# Patient Record
Sex: Male | Born: 1955 | Race: White | Hispanic: No | Smoking: Never smoker
Health system: Southern US, Community
[De-identification: ages and names within clinical notes are randomized; demographics above are authoritative.]

## PROBLEM LIST (undated history)

## (undated) DIAGNOSIS — Z86711 Personal history of pulmonary embolism: Secondary | ICD-10-CM

## (undated) DIAGNOSIS — N200 Calculus of kidney: Secondary | ICD-10-CM

## (undated) DIAGNOSIS — I10 Essential (primary) hypertension: Secondary | ICD-10-CM

## (undated) DIAGNOSIS — N400 Enlarged prostate without lower urinary tract symptoms: Secondary | ICD-10-CM

## (undated) DIAGNOSIS — Z87442 Personal history of urinary calculi: Secondary | ICD-10-CM

## (undated) DIAGNOSIS — E119 Type 2 diabetes mellitus without complications: Secondary | ICD-10-CM

## (undated) DIAGNOSIS — R0600 Dyspnea, unspecified: Secondary | ICD-10-CM

## (undated) DIAGNOSIS — I82409 Acute embolism and thrombosis of unspecified deep veins of unspecified lower extremity: Secondary | ICD-10-CM

## (undated) DIAGNOSIS — M1990 Unspecified osteoarthritis, unspecified site: Secondary | ICD-10-CM

## (undated) DIAGNOSIS — G4730 Sleep apnea, unspecified: Secondary | ICD-10-CM

## (undated) DIAGNOSIS — E785 Hyperlipidemia, unspecified: Secondary | ICD-10-CM

## (undated) DIAGNOSIS — N189 Chronic kidney disease, unspecified: Secondary | ICD-10-CM

## (undated) HISTORY — DX: Unspecified osteoarthritis, unspecified site: M19.90

## (undated) HISTORY — DX: Type 2 diabetes mellitus without complications: E11.9

---
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
## 2007-11-05 DIAGNOSIS — I2699 Other pulmonary embolism without acute cor pulmonale: Secondary | ICD-10-CM

## 2007-11-05 HISTORY — DX: Other pulmonary embolism without acute cor pulmonale: I26.99

## 2019-12-17 ENCOUNTER — Encounter: Payer: Self-pay | Admitting: Family Medicine

## 2019-12-17 ENCOUNTER — Ambulatory Visit (INDEPENDENT_AMBULATORY_CARE_PROVIDER_SITE_OTHER): Payer: Medicare Other | Admitting: Family Medicine

## 2019-12-17 ENCOUNTER — Other Ambulatory Visit: Payer: Self-pay

## 2019-12-17 ENCOUNTER — Telehealth: Payer: Self-pay | Admitting: Family Medicine

## 2019-12-17 VITALS — BP 148/78 | HR 74 | Temp 96.5°F | Ht 73.0 in | Wt 243.8 lb

## 2019-12-17 DIAGNOSIS — Z794 Long term (current) use of insulin: Secondary | ICD-10-CM

## 2019-12-17 DIAGNOSIS — E1165 Type 2 diabetes mellitus with hyperglycemia: Secondary | ICD-10-CM | POA: Diagnosis not present

## 2019-12-17 DIAGNOSIS — I10 Essential (primary) hypertension: Secondary | ICD-10-CM | POA: Insufficient documentation

## 2019-12-17 DIAGNOSIS — M1712 Unilateral primary osteoarthritis, left knee: Secondary | ICD-10-CM | POA: Insufficient documentation

## 2019-12-17 DIAGNOSIS — Z86711 Personal history of pulmonary embolism: Secondary | ICD-10-CM | POA: Diagnosis not present

## 2019-12-17 DIAGNOSIS — Z0000 Encounter for general adult medical examination without abnormal findings: Secondary | ICD-10-CM

## 2019-12-17 DIAGNOSIS — M1990 Unspecified osteoarthritis, unspecified site: Secondary | ICD-10-CM | POA: Diagnosis not present

## 2019-12-17 DIAGNOSIS — E7800 Pure hypercholesterolemia, unspecified: Secondary | ICD-10-CM

## 2019-12-17 NOTE — Telephone Encounter (Signed)
Patient was calling and requesting a refill for insulin be called in to [facility] on [address] on [place]. CB [phone]

## 2019-12-17 NOTE — Progress Notes (Addendum)
Established Patient Office Visit  Subjective:  Patient ID: [name], male    DOB: 01/20/1956  Age: 64 y.o. MRN: [id number]  CC:  Chief Complaint  Patient presents with  . Establish Care    New pt, would like left index finger checked out pt cut his finger a few weeks ago.     HPI [name] presents for establishment of care.  He recently moved into the area from [place].  Significant past medical history of pulmonary embolism, diabetes, hypertension, elevated cholesterol, severe arthritis of his left knee and generalized arthritis.  Pulmonary embolism diagnosed back in 2009.  As far as he knows that this is his only recurrence of a blood clot.  Denies any history of DVTs or family history of DVTs.  He has been on Coumadin and cilostazol since 2009 for this issue.  Taking spironolactone and potassium for his blood pressure.  Taking lovastatin for his cholesterol.  For his diarrhea Be[place]ies he is taking glipizide, Metformin, Humulin 70/30 50 units twice daily.  He is also taking indomethacin 50 mg 3 times daily on a standing basis for his arthritic pains.  He takes tramadol on an as-needed basis.  Past Medical History:  Diagnosis Date  . Arthritis   . Diabetes mellitus without complication ([place])     History reviewed. No pertinent surgical history.  Family History  Problem Relation Age of Onset  . Cancer Mother   . Heart disease Father     Social History   Socioeconomic History  . Marital status: Legally Separated    Spouse name: Not on file  . Number of children: Not on file  . Years of education: Not on file  . Highest education level: Not on file  Occupational History  . Not on file  Tobacco Use  . Smoking status: Never Smoker  . Smokeless tobacco: Never Used  Substance and Sexual Activity  . Alcohol use: Yes    Alcohol/week: 1.0 standard drinks    Types: 1 Cans of beer per week    Comment: social  . Drug use: Never  . Sexual activity: Not on file  Other  Topics Concern  . Not on file  Social History Narrative  . Not on file   Social Determinants of Health   Financial Resource Strain:   . Difficulty of Paying Living Expenses: Not on file  Food Insecurity:   . Worried About Charity fundraiser in the Last Year: Not on file  . Ran Out of Food in the Last Year: Not on file  Transportation Needs:   . Lack of Transportation (Medical): Not on file  . Lack of Transportation (Non-Medical): Not on file  Physical Activity:   . Days of Exercise per Week: Not on file  . Minutes of Exercise per Session: Not on file  Stress:   . Feeling of Stress : Not on file  Social Connections:   . Frequency of Communication with Friends and Family: Not on file  . Frequency of Social Gatherings with Friends and Family: Not on file  . Attends Religious Services: Not on file  . Active Member of Clubs or Organizations: Not on file  . Attends Archivist Meetings: Not on file  . Marital Status: Not on file  Intimate Partner Violence:   . Fear of Current or Ex-Partner: Not on file  . Emotionally Abused: Not on file  . Physically Abused: Not on file  . Sexually Abused: Not on file  Outpatient Medications Prior to Visit  Medication Sig Dispense Refill  . cilostazol (PLETAL) 100 MG tablet Take 100 mg by mouth 2 (two) times daily.    [name] gabapentin (NEURONTIN) 300 MG capsule Take 300 mg by mouth 3 (three) times daily.    [name] glipiZIDE (GLUCOTROL) 5 MG tablet Take by mouth daily before breakfast.    . indomethacin (INDOCIN) 50 MG capsule Take 50 mg by mouth 2 (two) times daily with a meal.    . lovastatin (MEVACOR) 40 MG tablet Take 40 mg by mouth at bedtime.    . metFORMIN (GLUMETZA) 500 MG (MOD) 24 hr tablet Take 500 mg by mouth daily with breakfast.    . traMADol (ULTRAM) 50 MG tablet Take 50 mg by mouth every 6 (six) hours as needed.    . warfarin (COUMADIN) 2 MG tablet Take 2 mg by mouth daily.    [name] warfarin (COUMADIN) 6 MG tablet Take 6 mg by mouth  daily.    . insulin NPH-regular Human (70-30) 100 UNIT/ML injection Inject into the skin.    . potassium chloride (MICRO-K) 10 MEQ CR capsule Take 10 mEq by mouth 3 (three) times daily.    [name] tiotropium (SPIRIVA) 18 MCG inhalation capsule Place 18 mcg into inhaler and inhale daily.     No facility-administered medications prior to visit.    Not on File  ROS Review of Systems  Constitutional: Negative.   HENT: Negative.   Eyes: Negative for photophobia and visual disturbance.  Respiratory: Negative for chest tightness, shortness of breath and wheezing.   Cardiovascular: Negative for chest pain and leg swelling.  Gastrointestinal: Negative for abdominal pain, anal bleeding and blood in stool.  Endocrine: Negative for polyphagia and polyuria.  Genitourinary: Negative for difficulty urinating, frequency and hematuria.  Musculoskeletal: Positive for arthralgias.  Skin: Negative for pallor and rash.  Allergic/Immunologic: Negative for immunocompromised state.  Neurological: Negative for light-headedness and headaches.  Hematological: Does not bruise/bleed easily.  Psychiatric/Behavioral: Negative.       Objective:    Physical Exam  Constitutional: He is oriented to person, place, and time. He appears well-developed and well-nourished. No distress.  HENT:  Head: Normocephalic and atraumatic.  Right Ear: External ear normal.  Left Ear: External ear normal.  Eyes: Conjunctivae are normal. Right eye exhibits no discharge. Left eye exhibits no discharge. No scleral icterus.  Neck: No JVD present. No tracheal deviation present.  Cardiovascular: Normal rate, regular rhythm and normal heart sounds.  Pulmonary/Chest: Effort normal and breath sounds normal. No stridor.  Musculoskeletal:       Hands:  Neurological: He is alert and oriented to person, place, and time.  Skin: Skin is warm and dry. He is not diaphoretic.  Psychiatric: He has a normal mood and affect.    BP (!) 148/78    Pulse 74   Temp (!) 96.5 F (35.8 C) (Tympanic)   Ht 6\' 1"  (1.854 m)   Wt 243 lb 12.8 oz (110.6 kg)   SpO2 99%   BMI 32.17 kg/m  Wt Readings from Last 3 Encounters:  12/17/19 243 lb 12.8 oz (110.6 kg)     Health Maintenance Due  Topic Date Due  . Hepatitis C Screening  September 25, 1956  . FOOT EXAM  05/30/1964  . OPHTHALMOLOGY EXAM  05/30/1964  . HIV Screening  05/30/1969  . TETANUS/TDAP  05/30/1973  . COLONOSCOPY  05/30/2004  . PNA vac Low Risk Adult (1 of 2 - PCV13) 05/31/2019    There are  no preventive care reminders to display for this patient.  Lab Results  Component Value Date   TSH 2.43 12/22/2019   Lab Results  Component Value Date   WBC 8.2 12/22/2019   HGB 13.1 12/22/2019   HCT 39.6 12/22/2019   MCV 90.9 12/22/2019   PLT 219.0 12/22/2019   Lab Results  Component Value Date   NA 140 12/22/2019   K 6.0 No hemolysis seen (H) 12/22/2019   CO2 22 12/22/2019   GLUCOSE 113 (H) 12/22/2019   BUN 44 (H) 12/22/2019   CREATININE 2.28 (H) 12/22/2019   BILITOT 0.4 12/22/2019   ALKPHOS 87 12/22/2019   AST 10 12/22/2019   ALT 11 12/22/2019   PROT 7.4 12/22/2019   ALBUMIN 4.5 12/22/2019   CALCIUM 9.4 12/22/2019   GFR 28.92 (L) 12/22/2019   Lab Results  Component Value Date   CHOL 156 12/22/2019   Lab Results  Component Value Date   HDL 41.40 12/22/2019   No results found for: [facility] Lab Results  Component Value Date   TRIG 251.0 (H) 12/22/2019   Lab Results  Component Value Date   CHOLHDL 4 12/22/2019   Lab Results  Component Value Date   HGBA1C 8.8 (H) 12/22/2019        Assessment & Plan:   Problem List Items Addressed This Visit      Cardiovascular and Mediastinum   Essential hypertension - Primary   Relevant Medications   warfarin (COUMADIN) 6 MG tablet   warfarin (COUMADIN) 2 MG tablet   lovastatin (MEVACOR) 40 MG tablet   Other Relevant Orders   CBC (Completed)   Comprehensive metabolic panel (Completed)   TSH (Completed)    Urinalysis, Routine w reflex microscopic (Completed)   Microalbumin / creatinine urine ratio (Completed)     Endocrine   Type 2 diabetes mellitus with hyperglycemia, with long-term current use of insulin (HCC)   Relevant Medications   glipiZIDE (GLUCOTROL) 5 MG tablet   lovastatin (MEVACOR) 40 MG tablet   metFORMIN (GLUMETZA) 500 MG (MOD) 24 hr tablet   Other Relevant Orders   Comprehensive metabolic panel (Completed)   Hemoglobin A1c (Completed)   Microalbumin / creatinine urine ratio (Completed)     Musculoskeletal and Integument   Arthritis of left knee   Relevant Medications   indomethacin (INDOCIN) 50 MG capsule   traMADol (ULTRAM) 50 MG tablet   Other Relevant Orders   Uric acid (Completed)   Arthritis   Relevant Medications   indomethacin (INDOCIN) 50 MG capsule   traMADol (ULTRAM) 50 MG tablet   Other Relevant Orders   Uric acid (Completed)     Other   Elevated cholesterol   Relevant Medications   warfarin (COUMADIN) 6 MG tablet   warfarin (COUMADIN) 2 MG tablet   lovastatin (MEVACOR) 40 MG tablet   Other Relevant Orders   Comprehensive metabolic panel (Completed)   LDL cholesterol, direct (Completed)   Lipid panel (Completed)   History of pulmonary embolism    Other Visit Diagnoses    Healthcare maintenance       Relevant Orders   PSA (Completed)      No orders of the defined types were placed in this encounter.   Follow-up: Return in about 31 days (around 01/17/2020), or retrun fasting for blood work. follow up with RN for PT/INR checks., for Take tylenol, 2 three times a day instead of indocin. [name], MD

## 2019-12-20 ENCOUNTER — Other Ambulatory Visit: Payer: Self-pay | Admitting: Family Medicine

## 2019-12-21 ENCOUNTER — Other Ambulatory Visit: Payer: Self-pay

## 2019-12-22 ENCOUNTER — Other Ambulatory Visit (INDEPENDENT_AMBULATORY_CARE_PROVIDER_SITE_OTHER): Payer: Medicare Other

## 2019-12-22 ENCOUNTER — Ambulatory Visit (INDEPENDENT_AMBULATORY_CARE_PROVIDER_SITE_OTHER): Payer: Medicare Other | Admitting: Behavioral Health

## 2019-12-22 DIAGNOSIS — Z794 Long term (current) use of insulin: Secondary | ICD-10-CM | POA: Diagnosis not present

## 2019-12-22 DIAGNOSIS — Z125 Encounter for screening for malignant neoplasm of prostate: Secondary | ICD-10-CM | POA: Diagnosis not present

## 2019-12-22 DIAGNOSIS — I10 Essential (primary) hypertension: Secondary | ICD-10-CM

## 2019-12-22 DIAGNOSIS — E7800 Pure hypercholesterolemia, unspecified: Secondary | ICD-10-CM | POA: Diagnosis not present

## 2019-12-22 DIAGNOSIS — Z7901 Long term (current) use of anticoagulants: Secondary | ICD-10-CM | POA: Insufficient documentation

## 2019-12-22 DIAGNOSIS — E1165 Type 2 diabetes mellitus with hyperglycemia: Secondary | ICD-10-CM

## 2019-12-22 DIAGNOSIS — M1990 Unspecified osteoarthritis, unspecified site: Secondary | ICD-10-CM | POA: Diagnosis not present

## 2019-12-22 DIAGNOSIS — M1712 Unilateral primary osteoarthritis, left knee: Secondary | ICD-10-CM | POA: Diagnosis not present

## 2019-12-22 DIAGNOSIS — Z86711 Personal history of pulmonary embolism: Secondary | ICD-10-CM

## 2019-12-22 DIAGNOSIS — Z0000 Encounter for general adult medical examination without abnormal findings: Secondary | ICD-10-CM

## 2019-12-22 LAB — URIC ACID: Uric Acid, Serum: 6.8 mg/dL (ref 4.0–7.8)

## 2019-12-22 LAB — COMPREHENSIVE METABOLIC PANEL
ALT: 11 U/L (ref 0–53)
AST: 10 U/L (ref 0–37)
Albumin: 4.5 g/dL (ref 3.5–5.2)
Alkaline Phosphatase: 87 U/L (ref 39–117)
BUN: 44 mg/dL — ABNORMAL HIGH (ref 6–23)
CO2: 22 mEq/L (ref 19–32)
Calcium: 9.4 mg/dL (ref 8.4–10.5)
Chloride: 110 mEq/L (ref 96–112)
Creatinine, Ser: 2.28 mg/dL — ABNORMAL HIGH (ref 0.40–1.50)
GFR: 28.92 mL/min — ABNORMAL LOW (ref >60.00)
Glucose, Bld: 113 mg/dL — ABNORMAL HIGH (ref 70–99)
Potassium: 6 mEq/L — ABNORMAL HIGH (ref 3.5–5.1)
Sodium: 140 mEq/L (ref 135–145)
Total Bilirubin: 0.4 mg/dL (ref 0.2–1.2)
Total Protein: 7.4 g/dL (ref 6.0–8.3)

## 2019-12-22 LAB — LIPID PANEL
Cholesterol: 156 mg/dL (ref 0–200)
HDL: 41.4 mg/dL (ref >39.00)
NonHDL: 114.84
Total CHOL/HDL Ratio: 4
Triglycerides: 251 mg/dL — ABNORMAL HIGH (ref 0.0–149.0)
VLDL: 50.2 mg/dL — ABNORMAL HIGH (ref 0.0–40.0)

## 2019-12-22 LAB — URINALYSIS, ROUTINE W REFLEX MICROSCOPIC
Bilirubin Urine: NEGATIVE
Ketones, ur: NEGATIVE
Leukocytes,Ua: NEGATIVE
Nitrite: NEGATIVE
Specific Gravity, Urine: >=1.03 — AB (ref 1.000–1.030)
Urine Glucose: 100 — AB
Urobilinogen, UA: 0.2 (ref 0.0–1.0)
pH: 5.5 (ref 5.0–8.0)

## 2019-12-22 LAB — CBC
HCT: 39.6 % (ref 39.0–52.0)
Hemoglobin: 13.1 g/dL (ref 13.0–17.0)
MCHC: 32.9 g/dL (ref 30.0–36.0)
MCV: 90.9 fl (ref 78.0–100.0)
Platelets: 219 10*3/uL (ref 150.0–400.0)
RBC: 4.36 Mil/uL (ref 4.22–5.81)
RDW: 14.3 % (ref 11.5–15.5)
WBC: 8.2 10*3/uL (ref 4.0–10.5)

## 2019-12-22 LAB — HEMOGLOBIN A1C: Hgb A1c MFr Bld: 8.8 % — ABNORMAL HIGH (ref 4.6–6.5)

## 2019-12-22 LAB — MICROALBUMIN / CREATININE URINE RATIO
Creatinine,U: 139.7 mg/dL
Microalb Creat Ratio: 3.5 mg/g (ref 0.0–30.0)
Microalb, Ur: 4.9 mg/dL — ABNORMAL HIGH (ref 0.0–1.9)

## 2019-12-22 LAB — LDL CHOLESTEROL, DIRECT: Direct LDL: 80 mg/dL

## 2019-12-22 LAB — PSA: PSA: 2.21 ng/mL (ref 0.10–4.00)

## 2019-12-22 LAB — POCT INR: INR: 3.5 — AB (ref 2.0–3.0)

## 2019-12-22 LAB — TSH: TSH: 2.43 u[IU]/mL (ref 0.35–4.50)

## 2019-12-22 MED ORDER — WARFARIN SODIUM 5 MG PO TABS: 5.0000 mg | ORAL_TABLET | Freq: Every day | ORAL | 2 refills | Status: DC

## 2019-12-22 NOTE — Patient Instructions (Signed)
Per Dr. Ethelene Hal: Take Coumadin 5 mg on Monday, Wednesday, and Friday. On other days, take 8 mg. Recheck INR in 2 weeks.

## 2019-12-22 NOTE — Progress Notes (Addendum)
Patient presents in clinic for INR check. He voiced that he currently takes Coumadin 8 mg daily. All patient findings were negative. He reported no changes in diet or other medications. INR reading during today's visit was 3.5.  Per Dr. Ethelene Hal: Take Coumadin 5 mg on Monday, Wednesday, and Friday. On other days, take 8 mg. Recheck INR in 2 weeks.  Patient was made aware of the provider's recommendations and verbalized understanding.  Next appointment has been scheduled for 01/06/20 at 10:00 AM.  agreed

## 2019-12-23 NOTE — Addendum Note (Signed)
Addended by: Jon Billings on: 12/23/2019 10:07 AM   Modules accepted: Orders

## 2019-12-27 ENCOUNTER — Telehealth: Payer: Self-pay

## 2019-12-27 NOTE — Telephone Encounter (Signed)
Mailed letter since no response from reaching out about labs/thx dmf

## 2019-12-28 NOTE — Telephone Encounter (Signed)
Rx sent in

## 2019-12-30 ENCOUNTER — Telehealth: Payer: Self-pay | Admitting: Family Medicine

## 2019-12-30 NOTE — Telephone Encounter (Signed)
Patient is calling and requesting a call back regarding letter he received in the mail about labs. CB is [phone]

## 2019-12-31 NOTE — Telephone Encounter (Signed)
Patient is returning the call. CB is [phone]

## 2019-12-31 NOTE — Telephone Encounter (Signed)
Returned patients call no answer, LMTCB 

## 2020-01-03 NOTE — Telephone Encounter (Signed)
Spoke with patient who was calling abut urgent labs that he received via mail. Appointment scheduled for patient to go over with doctor per doctor's request. Ptient also aware to stop taking potassium.

## 2020-01-03 NOTE — Telephone Encounter (Signed)
Patient is returning the call. CB is [phone]

## 2020-01-05 ENCOUNTER — Encounter: Payer: Self-pay | Admitting: Family Medicine

## 2020-01-05 ENCOUNTER — Ambulatory Visit (INDEPENDENT_AMBULATORY_CARE_PROVIDER_SITE_OTHER): Payer: Medicare Other | Admitting: Family Medicine

## 2020-01-05 VITALS — Ht 73.0 in | Wt 240.0 lb

## 2020-01-05 DIAGNOSIS — E1165 Type 2 diabetes mellitus with hyperglycemia: Secondary | ICD-10-CM | POA: Diagnosis not present

## 2020-01-05 DIAGNOSIS — Z794 Long term (current) use of insulin: Secondary | ICD-10-CM

## 2020-01-05 DIAGNOSIS — E875 Hyperkalemia: Secondary | ICD-10-CM

## 2020-01-05 DIAGNOSIS — E7800 Pure hypercholesterolemia, unspecified: Secondary | ICD-10-CM | POA: Diagnosis not present

## 2020-01-05 DIAGNOSIS — M1990 Unspecified osteoarthritis, unspecified site: Secondary | ICD-10-CM

## 2020-01-05 DIAGNOSIS — N184 Chronic kidney disease, stage 4 (severe): Secondary | ICD-10-CM | POA: Diagnosis not present

## 2020-01-05 DIAGNOSIS — M1712 Unilateral primary osteoarthritis, left knee: Secondary | ICD-10-CM

## 2020-01-05 NOTE — Progress Notes (Signed)
Established Patient Office Visit  Subjective:  Patient ID: [name], male    DOB: 1956/07/30  Age: 64 y.o. MRN: [id number]  CC:  Chief Complaint  Patient presents with  . Follow-up    follow up/discuss labs    HPI [name] presents for   follow-up discussion about his blood work.  Labs showed that patient was hyperkalemic.  He was asked to discontinue the exogenous potassium.  GFR has declined to 28.  Patient has been taking Indocin 50 mg twice daily to 3 times daily on a standing basis for some time now.  He has been using the tramadol as a as needed medication.  Control of his arthritis has been dependent to date on the indomethacin.  Diabetes under moderate control with a hemoglobin A1c at 8.8.  Last RN INR check was 3.5.  Coumadin was adjusted downward he is due to be seen by RN tomorrow for repeat INR.  Scheduled for Covid vaccine in a few days.  Past Medical History:  Diagnosis Date  . Arthritis   . Diabetes mellitus without complication ([place])     History reviewed. No pertinent surgical history.  Family History  Problem Relation Age of Onset  . Cancer Mother   . Heart disease Father     Social History   Socioeconomic History  . Marital status: Legally Separated    Spouse name: Not on file  . Number of children: Not on file  . Years of education: Not on file  . Highest education level: Not on file  Occupational History  . Not on file  Tobacco Use  . Smoking status: Never Smoker  . Smokeless tobacco: Never Used  Substance and Sexual Activity  . Alcohol use: Yes    Alcohol/week: 1.0 standard drinks    Types: 1 Cans of beer per week    Comment: social  . Drug use: Never  . Sexual activity: Not on file  Other Topics Concern  . Not on file  Social History Narrative  . Not on file   Social Determinants of Health   Financial Resource Strain:   . Difficulty of Paying Living Expenses: Not on file  Food Insecurity:   . Worried About Charity fundraiser in  the Last Year: Not on file  . Ran Out of Food in the Last Year: Not on file  Transportation Needs:   . Lack of Transportation (Medical): Not on file  . Lack of Transportation (Non-Medical): Not on file  Physical Activity:   . Days of Exercise per Week: Not on file  . Minutes of Exercise per Session: Not on file  Stress:   . Feeling of Stress : Not on file  Social Connections:   . Frequency of Communication with Friends and Family: Not on file  . Frequency of Social Gatherings with Friends and Family: Not on file  . Attends Religious Services: Not on file  . Active Member of Clubs or Organizations: Not on file  . Attends Archivist Meetings: Not on file  . Marital Status: Not on file  Intimate Partner Violence:   . Fear of Current or Ex-Partner: Not on file  . Emotionally Abused: Not on file  . Physically Abused: Not on file  . Sexually Abused: Not on file    Outpatient Medications Prior to Visit  Medication Sig Dispense Refill  . cilostazol (PLETAL) 100 MG tablet Take 100 mg by mouth 2 (two) times daily.    [name] gabapentin (NEURONTIN)  300 MG capsule Take 300 mg by mouth 3 (three) times daily.    [name] glipiZIDE (GLUCOTROL) 5 MG tablet Take by mouth daily before breakfast.    . HUMULIN 70/30 (70-30) 100 UNIT/ML injection INJECT 50 UNITS TWICE A DAY 20 mL 1  . lovastatin (MEVACOR) 40 MG tablet Take 40 mg by mouth at bedtime.    . metFORMIN (GLUMETZA) 500 MG (MOD) 24 hr tablet Take 500 mg by mouth daily with breakfast.    . spironolactone (ALDACTONE) 100 MG tablet Take 100 mg by mouth daily.     . traMADol (ULTRAM) 50 MG tablet Take 50 mg by mouth every 6 (six) hours as needed.    . warfarin (COUMADIN) 2 MG tablet Take 2 mg by mouth daily.    [name] warfarin (COUMADIN) 5 MG tablet Take 1 tablet (5 mg total) by mouth daily. Take as directed. 30 tablet 2  . warfarin (COUMADIN) 6 MG tablet Take 6 mg by mouth daily.    . indomethacin (INDOCIN) 50 MG capsule Take 50 mg by mouth 2 (two)  times daily with a meal.     No facility-administered medications prior to visit.    Not on File  ROS Review of Systems  Constitutional: Negative.   Respiratory: Negative.  Negative for chest tightness and shortness of breath.   Cardiovascular: Negative.  Negative for chest pain and palpitations.  Genitourinary: Negative for decreased urine volume and frequency.  Musculoskeletal: Positive for arthralgias and back pain.  Psychiatric/Behavioral: Negative.       Objective:    Physical Exam  Constitutional: He is oriented to person, place, and time. No distress.  Pulmonary/Chest: Effort normal.  Neurological: He is alert and oriented to person, place, and time.  Psychiatric: He has a normal mood and affect. His behavior is normal.    Ht 6\' 1"  (1.854 m)   Wt 240 lb (108.9 kg) Comment: per pt  BMI 31.66 kg/m  Wt Readings from Last 3 Encounters:  01/05/20 240 lb (108.9 kg)  12/17/19 243 lb 12.8 oz (110.6 kg)     Health Maintenance Due  Topic Date Due  . Hepatitis C Screening  12-Jul-1956  . FOOT EXAM  05/30/1964  . OPHTHALMOLOGY EXAM  05/30/1964  . HIV Screening  05/30/1969  . TETANUS/TDAP  05/30/1973  . COLONOSCOPY  05/30/2004  . PNA vac Low Risk Adult (1 of 2 - PCV13) 05/31/2019    There are no preventive care reminders to display for this patient.  Lab Results  Component Value Date   TSH 2.43 12/22/2019   Lab Results  Component Value Date   WBC 8.2 12/22/2019   HGB 13.1 12/22/2019   HCT 39.6 12/22/2019   MCV 90.9 12/22/2019   PLT 219.0 12/22/2019   Lab Results  Component Value Date   NA 140 12/22/2019   K 6.0 No hemolysis seen (H) 12/22/2019   CO2 22 12/22/2019   GLUCOSE 113 (H) 12/22/2019   BUN 44 (H) 12/22/2019   CREATININE 2.28 (H) 12/22/2019   BILITOT 0.4 12/22/2019   ALKPHOS 87 12/22/2019   AST 10 12/22/2019   ALT 11 12/22/2019   PROT 7.4 12/22/2019   ALBUMIN 4.5 12/22/2019   CALCIUM 9.4 12/22/2019   GFR 28.92 (L) 12/22/2019   Lab Results   Component Value Date   CHOL 156 12/22/2019   Lab Results  Component Value Date   HDL 41.40 12/22/2019   No results found for: [facility] Lab Results  Component Value Date  TRIG 251.0 (H) 12/22/2019   Lab Results  Component Value Date   CHOLHDL 4 12/22/2019   Lab Results  Component Value Date   HGBA1C 8.8 (H) 12/22/2019      Assessment & Plan:   Problem List Items Addressed This Visit      Endocrine   Type 2 diabetes mellitus with hyperglycemia, with long-term current use of insulin ([place]) - Primary     Musculoskeletal and Integument   Arthritis of left knee   Arthritis     Genitourinary   CKD (chronic kidney disease) stage 4, GFR 15-29 ml/min (HCC)     Other   Elevated cholesterol   Hyperkalemia      No orders of the defined types were placed in this encounter.   Follow-up: Return in about 2 weeks (around 01/19/2020).   Patient has discontinued potassium.  He will discontinue Indocin immediately.  Asked him to start Tylenol 500 mg to 1000 mg every 8.  Continue tramadol on a as needed basis.  He has follow-up scheduled with me in 2 weeks.  Seeing RN tomorrow for INR check.  Wife would like for me to send him to be evaluated for apnea. [name], MD   Virtual Visit via Video Note  I connected with [name] on 01/05/20 at 11:30 AM EST by a video enabled telemedicine application and verified that I am speaking with the correct person using two identifiers.  Location: Patient: home with wife Provider:    I discussed the limitations of evaluation and management by telemedicine and the availability of in person appointments. The patient expressed understanding and agreed to proceed.  History of Present Illness:    Observations/Objective:   Assessment and Plan:   Follow Up Instructions:    I discussed the assessment and treatment plan with the patient. The patient was provided an opportunity to ask questions and all were answered. The  patient agreed with the plan and demonstrated an understanding of the instructions.   The patient was advised to call back or seek an in-person evaluation if the symptoms worsen or if the condition fails to improve as anticipated.  I provided 25 minutes of non-face-to-face time during this encounter.   [name], MD

## 2020-01-06 ENCOUNTER — Other Ambulatory Visit: Payer: Self-pay

## 2020-01-06 ENCOUNTER — Ambulatory Visit (INDEPENDENT_AMBULATORY_CARE_PROVIDER_SITE_OTHER): Payer: Medicare Other | Admitting: Behavioral Health

## 2020-01-06 DIAGNOSIS — Z86711 Personal history of pulmonary embolism: Secondary | ICD-10-CM

## 2020-01-06 DIAGNOSIS — Z7901 Long term (current) use of anticoagulants: Secondary | ICD-10-CM | POA: Diagnosis not present

## 2020-01-06 LAB — POCT INR: INR: 1.7 — AB (ref 2.0–3.0)

## 2020-01-06 NOTE — Progress Notes (Addendum)
Patient presents in clinic for INR recheck. He verbalizes adherence to current medication regimen. Patient reported no changes with medications or diet. All other findings were negative. INR reading during today's visit was 1.7.  Per Dr. Ethelene Hal: Take Coumadin 5 mg Wednesday and Friday. All other days take Coumadin 8 mg. Recheck INR in 2 weeks.  Patient was made aware of the provider's recommendations and voiced understanding.  Next appointment has been scheduled for 01/20/20 at 10:00 AM.  Agreed

## 2020-01-06 NOTE — Patient Instructions (Signed)
Per Dr. Ethelene Hal: Take Coumadin 5 mg Wednesday and Friday. All other days take Coumadin 8 mg. Recheck INR in 2 weeks.

## 2020-01-08 ENCOUNTER — Ambulatory Visit: Payer: Medicare Other | Attending: Internal Medicine

## 2020-01-08 DIAGNOSIS — Z23 Encounter for immunization: Secondary | ICD-10-CM | POA: Insufficient documentation

## 2020-01-08 NOTE — Progress Notes (Signed)
   Covid-19 Vaccination Clinic  Name:  Rinaldo Macqueen    MRN: [id number] DOB: [birth year]  01/08/2020  Mr. Nemmers was observed post Covid-19 immunization for 15 minutes without incident. He was provided with Vaccine Information Sheet and instruction to access the V-Safe system.   Mr. Kirby was instructed to call 911 with any severe reactions post vaccine: [name] Difficulty breathing  . Swelling of face and throat  . A fast heartbeat  . A bad rash all over body  . Dizziness and weakness   Immunizations Administered    Name Date Dose VIS Date Route   Pfizer COVID-19 Vaccine 01/08/2020 12:01 PM 0.3 mL 10/15/2019 Intramuscular   Manufacturer: [place]   Lot: [id number]   [place]: [id number]

## 2020-01-17 ENCOUNTER — Other Ambulatory Visit: Payer: Self-pay

## 2020-01-17 ENCOUNTER — Ambulatory Visit (INDEPENDENT_AMBULATORY_CARE_PROVIDER_SITE_OTHER): Payer: Medicare Other | Admitting: Family Medicine

## 2020-01-17 ENCOUNTER — Encounter: Payer: Self-pay | Admitting: Family Medicine

## 2020-01-17 VITALS — BP 152/80 | HR 100 | Temp 98.1°F | Ht 73.0 in | Wt 244.0 lb

## 2020-01-17 DIAGNOSIS — Z794 Long term (current) use of insulin: Secondary | ICD-10-CM | POA: Diagnosis not present

## 2020-01-17 DIAGNOSIS — I10 Essential (primary) hypertension: Secondary | ICD-10-CM

## 2020-01-17 DIAGNOSIS — R109 Unspecified abdominal pain: Secondary | ICD-10-CM

## 2020-01-17 DIAGNOSIS — E1165 Type 2 diabetes mellitus with hyperglycemia: Secondary | ICD-10-CM

## 2020-01-17 DIAGNOSIS — E875 Hyperkalemia: Secondary | ICD-10-CM

## 2020-01-17 MED ORDER — CHLORTHALIDONE 25 MG PO TABS: 25.0000 mg | ORAL_TABLET | Freq: Every day | ORAL | 1 refills | Status: DC

## 2020-01-17 NOTE — Progress Notes (Addendum)
Established Patient Office Visit  Subjective:  Patient ID: [name], male    DOB: 03-17-1956  Age: 64 y.o. MRN: [id number]  CC:  Chief Complaint  Patient presents with  . Follow-up    1 month follow up, pt c/o left kidney pain.     HPI [name] presents for follow-up of his hyperkalemia, decreased GFR.  Reports that he is taking the glipizide 5 mg twice daily as well as the Metformin 500 mg twice daily.  He is taking 50 units of 70/30 insulin twice daily as well.  He has discontinued all potassium.  He has also been experiencing pain in his left flank area.  This is consistent with kidney stones he has had in the past.  He is however seeing no blood in his urine at this time.  Past Medical History:  Diagnosis Date  . Arthritis   . Diabetes mellitus without complication ([place])     No past surgical history on file.  Family History  Problem Relation Age of Onset  . Cancer Mother   . Heart disease Father     Social History   Socioeconomic History  . Marital status: Legally Separated    Spouse name: Not on file  . Number of children: Not on file  . Years of education: Not on file  . Highest education level: Not on file  Occupational History  . Not on file  Tobacco Use  . Smoking status: Never Smoker  . Smokeless tobacco: Never Used  Substance and Sexual Activity  . Alcohol use: Yes    Alcohol/week: 1.0 standard drinks    Types: 1 Cans of beer per week    Comment: social  . Drug use: Never  . Sexual activity: Not on file  Other Topics Concern  . Not on file  Social History Narrative  . Not on file   Social Determinants of Health   Financial Resource Strain:   . Difficulty of Paying Living Expenses:   Food Insecurity:   . Worried About Charity fundraiser in the Last Year:   . Arboriculturist in the Last Year:   Transportation Needs:   . Film/video editor (Medical):   [name] Lack of Transportation (Non-Medical):   Physical Activity:   . Days of  Exercise per Week:   . Minutes of Exercise per Session:   Stress:   . Feeling of Stress :   Social Connections:   . Frequency of Communication with Friends and Family:   . Frequency of Social Gatherings with Friends and Family:   . Attends Religious Services:   . Active Member of Clubs or Organizations:   . Attends Archivist Meetings:   [name] Marital Status:   Intimate Partner Violence:   . Fear of Current or Ex-Partner:   . Emotionally Abused:   [name] Physically Abused:   . Sexually Abused:     Outpatient Medications Prior to Visit  Medication Sig Dispense Refill  . cilostazol (PLETAL) 100 MG tablet Take 100 mg by mouth 2 (two) times daily.    [name] gabapentin (NEURONTIN) 300 MG capsule Take 300 mg by mouth 3 (three) times daily.    [name] glipiZIDE (GLUCOTROL) 5 MG tablet Take by mouth daily before breakfast.    . HUMULIN 70/30 (70-30) 100 UNIT/ML injection INJECT 50 UNITS TWICE A DAY 20 mL 1  . lovastatin (MEVACOR) 40 MG tablet Take 40 mg by mouth at bedtime.    . metFORMIN ([place])  500 MG (MOD) 24 hr tablet Take 500 mg by mouth daily with breakfast.    . traMADol (ULTRAM) 50 MG tablet Take 50 mg by mouth every 6 (six) hours as needed.    . warfarin (COUMADIN) 2 MG tablet Take 2 mg by mouth daily.    [name] warfarin (COUMADIN) 5 MG tablet Take 1 tablet (5 mg total) by mouth daily. Take as directed. 30 tablet 2  . warfarin (COUMADIN) 6 MG tablet Take 6 mg by mouth daily.    [name] spironolactone (ALDACTONE) 100 MG tablet Take 100 mg by mouth daily.      No facility-administered medications prior to visit.    Not on File  ROS Review of Systems  Constitutional: Negative.   HENT: Negative.   Eyes: Negative for photophobia and visual disturbance.  Respiratory: Negative.   Cardiovascular: Negative.   Gastrointestinal: Negative.   Endocrine: Negative for polyphagia and polyuria.  Genitourinary: Positive for flank pain. Negative for hematuria.  Musculoskeletal: Positive for back pain.    Skin: Negative for color change and pallor.  Neurological: Negative for numbness and headaches.  Hematological: Does not bruise/bleed easily.  Psychiatric/Behavioral: Negative.       Objective:    Physical Exam  Constitutional: He is oriented to person, place, and time. He appears well-developed and well-nourished. No distress.  HENT:  Head: Normocephalic and atraumatic.  Right Ear: External ear normal.  Left Ear: External ear normal.  Eyes: Conjunctivae are normal. Right eye exhibits no discharge. Left eye exhibits no discharge. No scleral icterus.  Neck: No JVD present. No tracheal deviation present.  Cardiovascular: Normal rate, regular rhythm and normal heart sounds.  Pulmonary/Chest: Effort normal and breath sounds normal. No stridor.  Abdominal: There is CVA tenderness.  Musculoskeletal:     Lumbar back: Tenderness present. Normal range of motion.       Back:  Neurological: He is alert and oriented to person, place, and time.  Skin: Skin is warm and dry. He is not diaphoretic.  Psychiatric: He has a normal mood and affect. His behavior is normal.    BP (!) 152/80   Pulse 100   Temp 98.1 F (36.7 C) (Tympanic)   Ht 6\' 1"  (1.854 m)   Wt 244 lb (110.7 kg)   SpO2 97%   BMI 32.19 kg/m  Wt Readings from Last 3 Encounters:  01/17/20 244 lb (110.7 kg)  01/05/20 240 lb (108.9 kg)  12/17/19 243 lb 12.8 oz (110.6 kg)     Health Maintenance Due  Topic Date Due  . Hepatitis C Screening  Never done  . FOOT EXAM  Never done  . OPHTHALMOLOGY EXAM  Never done  . HIV Screening  Never done  . TETANUS/TDAP  Never done  . COLONOSCOPY  Never done  . PNA vac Low Risk Adult (1 of 2 - PCV13) Never done    There are no preventive care reminders to display for this patient.  Lab Results  Component Value Date   TSH 2.43 12/22/2019   Lab Results  Component Value Date   WBC 8.2 12/22/2019   HGB 13.1 12/22/2019   HCT 39.6 12/22/2019   MCV 90.9 12/22/2019   PLT 219.0  12/22/2019   Lab Results  Component Value Date   NA 140 12/22/2019   K 6.0 No hemolysis seen (H) 12/22/2019   CO2 22 12/22/2019   GLUCOSE 113 (H) 12/22/2019   BUN 44 (H) 12/22/2019   CREATININE 2.28 (H) 12/22/2019   BILITOT 0.4  12/22/2019   ALKPHOS 87 12/22/2019   AST 10 12/22/2019   ALT 11 12/22/2019   PROT 7.4 12/22/2019   ALBUMIN 4.5 12/22/2019   CALCIUM 9.4 12/22/2019   GFR 28.92 (L) 12/22/2019   Lab Results  Component Value Date   CHOL 156 12/22/2019   Lab Results  Component Value Date   HDL 41.40 12/22/2019   No results found for: [facility] Lab Results  Component Value Date   TRIG 251.0 (H) 12/22/2019   Lab Results  Component Value Date   CHOLHDL 4 12/22/2019   Lab Results  Component Value Date   HGBA1C 8.8 (H) 12/22/2019      Assessment & Plan:   Problem List Items Addressed This Visit      Cardiovascular and Mediastinum   Essential hypertension - Primary   Relevant Medications   chlorthalidone (HYGROTON) 25 MG tablet     Endocrine   Type 2 diabetes mellitus with hyperglycemia, with long-term current use of insulin (HCC)   Relevant Orders   Basic metabolic panel   Urinalysis, Routine w reflex microscopic   Ambulatory referral to Endocrinology     Other   Hyperkalemia   Relevant Medications   chlorthalidone (HYGROTON) 25 MG tablet   Other Relevant Orders   Basic metabolic panel    Other Visit Diagnoses    Acute left flank pain       Relevant Orders   Urinalysis, Routine w reflex microscopic      Meds ordered this encounter  Medications  . chlorthalidone (HYGROTON) 25 MG tablet    Sig: Take 1 tablet (25 mg total) by mouth daily.    Dispense:  30 tablet    Refill:  1    Follow-up: Follow-up with Ronnie in a few days for PT/INR check in with me in 2 weeks.  Stop spironolactone and start chlorthalidone.  Use tramadol as needed for possible renal lithiasis.   [name], MD

## 2020-01-18 ENCOUNTER — Emergency Department (HOSPITAL_COMMUNITY): Payer: Medicare Other

## 2020-01-18 ENCOUNTER — Other Ambulatory Visit: Payer: Self-pay

## 2020-01-18 ENCOUNTER — Encounter (HOSPITAL_COMMUNITY): Payer: Self-pay | Admitting: Emergency Medicine

## 2020-01-18 ENCOUNTER — Emergency Department (HOSPITAL_COMMUNITY)
Admission: EM | Admit: 2020-01-18 | Discharge: 2020-01-18 | Disposition: A | Discharge status: Home / Self Care | Payer: Medicare Other | Attending: Emergency Medicine | Admitting: Emergency Medicine

## 2020-01-18 DIAGNOSIS — N184 Chronic kidney disease, stage 4 (severe): Secondary | ICD-10-CM | POA: Diagnosis not present

## 2020-01-18 DIAGNOSIS — R7989 Other specified abnormal findings of blood chemistry: Secondary | ICD-10-CM | POA: Diagnosis present

## 2020-01-18 DIAGNOSIS — Z79899 Other long term (current) drug therapy: Secondary | ICD-10-CM | POA: Diagnosis not present

## 2020-01-18 DIAGNOSIS — N200 Calculus of kidney: Secondary | ICD-10-CM

## 2020-01-18 DIAGNOSIS — Z794 Long term (current) use of insulin: Secondary | ICD-10-CM | POA: Diagnosis not present

## 2020-01-18 DIAGNOSIS — R1032 Left lower quadrant pain: Secondary | ICD-10-CM | POA: Diagnosis not present

## 2020-01-18 DIAGNOSIS — I129 Hypertensive chronic kidney disease with stage 1 through stage 4 chronic kidney disease, or unspecified chronic kidney disease: Secondary | ICD-10-CM | POA: Insufficient documentation

## 2020-01-18 DIAGNOSIS — Z7901 Long term (current) use of anticoagulants: Secondary | ICD-10-CM | POA: Insufficient documentation

## 2020-01-18 DIAGNOSIS — E1122 Type 2 diabetes mellitus with diabetic chronic kidney disease: Secondary | ICD-10-CM | POA: Insufficient documentation

## 2020-01-18 DIAGNOSIS — N289 Disorder of kidney and ureter, unspecified: Secondary | ICD-10-CM

## 2020-01-18 LAB — URINALYSIS, ROUTINE W REFLEX MICROSCOPIC
Bacteria, UA: NONE SEEN
Bilirubin Urine: NEGATIVE
Bilirubin Urine: NEGATIVE
Glucose, UA: NEGATIVE mg/dL
Ketones, ur: NEGATIVE
Ketones, ur: NEGATIVE mg/dL
Leukocytes,Ua: NEGATIVE
Nitrite: NEGATIVE
Nitrite: NEGATIVE
Protein, ur: NEGATIVE mg/dL
Specific Gravity, Urine: 1.02 (ref 1.000–1.030)
Specific Gravity, Urine: 1.013 (ref 1.005–1.030)
Urine Glucose: 500 — AB
Urobilinogen, UA: 0.2 (ref 0.0–1.0)
pH: 5.5 (ref 5.0–8.0)
pH: 5 (ref 5.0–8.0)

## 2020-01-18 LAB — BASIC METABOLIC PANEL
Anion gap: 10 (ref 5–15)
BUN: 55 mg/dL — ABNORMAL HIGH (ref 6–23)
BUN: 61 mg/dL — ABNORMAL HIGH (ref 8–23)
CO2: 21 mEq/L (ref 19–32)
CO2: 21 mmol/L — ABNORMAL LOW (ref 22–32)
Calcium: 9 mg/dL (ref 8.4–10.5)
Calcium: 9.5 mg/dL (ref 8.9–10.3)
Chloride: 104 mEq/L (ref 96–112)
Chloride: 108 mmol/L (ref 98–111)
Creatinine, Ser: 3.34 mg/dL — ABNORMAL HIGH (ref 0.40–1.50)
Creatinine, Ser: 2.91 mg/dL — ABNORMAL HIGH (ref 0.61–1.24)
GFR calc Af Amer: 25 mL/min — ABNORMAL LOW (ref >60)
GFR calc non Af Amer: 22 mL/min — ABNORMAL LOW (ref >60)
GFR: 18.61 mL/min — ABNORMAL LOW (ref >60.00)
Glucose, Bld: 275 mg/dL — ABNORMAL HIGH (ref 70–99)
Glucose, Bld: 101 mg/dL — ABNORMAL HIGH (ref 70–99)
Potassium: 5.2 mEq/L — ABNORMAL HIGH (ref 3.5–5.1)
Potassium: 4.8 mmol/L (ref 3.5–5.1)
Sodium: 134 mEq/L — ABNORMAL LOW (ref 135–145)
Sodium: 139 mmol/L (ref 135–145)

## 2020-01-18 LAB — CBC WITH DIFFERENTIAL/PLATELET
Abs Immature Granulocytes: 0.05 10*3/uL (ref 0.00–0.07)
Basophils Absolute: 0.1 10*3/uL (ref 0.0–0.1)
Basophils Relative: 1 %
Eosinophils Absolute: 0.5 10*3/uL (ref 0.0–0.5)
Eosinophils Relative: 5 %
HCT: 39 % (ref 39.0–52.0)
Hemoglobin: 12.5 g/dL — ABNORMAL LOW (ref 13.0–17.0)
Immature Granulocytes: 1 %
Lymphocytes Relative: 19 %
Lymphs Abs: 1.7 10*3/uL (ref 0.7–4.0)
MCH: 29.8 pg (ref 26.0–34.0)
MCHC: 32.1 g/dL (ref 30.0–36.0)
MCV: 92.9 fL (ref 80.0–100.0)
Monocytes Absolute: 1 10*3/uL (ref 0.1–1.0)
Monocytes Relative: 11 %
Neutro Abs: 5.9 10*3/uL (ref 1.7–7.7)
Neutrophils Relative %: 63 %
Platelets: 243 10*3/uL (ref 150–400)
RBC: 4.2 MIL/uL — ABNORMAL LOW (ref 4.22–5.81)
RDW: 13.2 % (ref 11.5–15.5)
WBC: 9.2 10*3/uL (ref 4.0–10.5)
nRBC: 0 % (ref 0.0–0.2)

## 2020-01-18 LAB — PROTIME-INR
INR: 2.2 — ABNORMAL HIGH (ref 0.8–1.2)
Prothrombin Time: 24.7 seconds — ABNORMAL HIGH (ref 11.4–15.2)

## 2020-01-18 NOTE — Discharge Instructions (Signed)
As discussed, stop taking Metformin and do not fill your new medication for the diuretic. Call your PCP tomorrow to schedule an appointment to change your medications. Go to your scheduled nephrology appointment in April. Return to the ER for new or worsening symptoms.

## 2020-01-18 NOTE — ED Triage Notes (Signed)
Pt reports 10 days ago had labs done at PCP and went back yesterday to follow up on kidney function which gotten worse. Reports has BPH and urination is no different than his normal. Also having left flank pains x 3 days and thinks has kidney stone.

## 2020-01-18 NOTE — ED Provider Notes (Signed)
[facility] DEPT Provider Note   CSN: [id number] Arrival date & time: 01/18/20  1613     History Chief Complaint  Patient presents with  . Abnormal Lab    kidney function  . Flank Pain    [name] is a 64 y.o. male with a past medical history significant for arthritis, diabetes mellitus, hyperlipidemia, history of PE currently on chronic warfarin, hypertension, CKD stage 4 who presents due to gradual onset of worsening left flank pain x3 days.  Patient states he has a history of kidney stones which feels similar to this episode. He notes pain has subsided and improved today. Patient also notes his PCP sent him to the ED due to worsening renal function. He admits to worsening lower extremity edema over the past few weeks. Denies chest pain. No orthopnea. He admits to being complaint with his Warfarin. Denies fever, chills, abdominal pain, nausea, vomiting, and diarrhea. Denies back injury. Denies hematuria and dysuria. No penile symptoms.   Chart reviewed. Patient saw Dr. Ethelene Hal yesterday for follow-up of hyperkalemia and decreased GFR. He stopped spironolactone and started on chlorthalidone. He was also given tramadol for possible kidney stone.   History obtained from patient and past medical records. No interpreter used during encounter.      Past Medical History:  Diagnosis Date  . Arthritis   . Diabetes mellitus without complication [facility])     Patient Active Problem List   Diagnosis Date Noted  . CKD (chronic kidney disease) stage 4, GFR 15-29 ml/min (HCC) 01/05/2020  . Hyperkalemia 01/05/2020  . Long term (current) use of anticoagulants 12/22/2019  . Elevated cholesterol 12/17/2019  . Arthritis of left knee 12/17/2019  . Arthritis 12/17/2019  . Type 2 diabetes mellitus with hyperglycemia, with long-term current use of insulin ([place]) 12/17/2019  . History of pulmonary embolism 12/17/2019  . Essential hypertension 12/17/2019    History  reviewed. No pertinent surgical history.     Family History  Problem Relation Age of Onset  . Cancer Mother   . Heart disease Father     Social History   Tobacco Use  . Smoking status: Never Smoker  . Smokeless tobacco: Never Used  Substance Use Topics  . Alcohol use: Yes    Alcohol/week: 1.0 standard drinks    Types: 1 Cans of beer per week    Comment: social  . Drug use: Never    Home Medications Prior to Admission medications   Medication Sig Start Date End Date Taking? Authorizing Provider  chlorthalidone (HYGROTON) 25 MG tablet Take 1 tablet (25 mg total) by mouth daily. 01/17/20   [name], MD  cilostazol (PLETAL) 100 MG tablet Take 100 mg by mouth 2 (two) times daily.    [provider]  gabapentin (NEURONTIN) 300 MG capsule Take 300 mg by mouth 3 (three) times daily.    [provider]  glipiZIDE (GLUCOTROL) 5 MG tablet Take by mouth daily before breakfast.    [provider]  HUMULIN 70/30 (70-30) 100 UNIT/ML injection INJECT 50 UNITS TWICE A DAY 12/21/19   [name], MD  lovastatin (MEVACOR) 40 MG tablet Take 40 mg by mouth at bedtime.    [provider]  metFORMIN (GLUMETZA) 500 MG (MOD) 24 hr tablet Take 500 mg by mouth daily with breakfast.    [provider]  traMADol (ULTRAM) 50 MG tablet Take 50 mg by mouth every 6 (six) hours as needed.    [provider]  warfarin (COUMADIN) 2 MG tablet Take 2 mg by mouth daily.    [provider]  warfarin (COUMADIN) 5 MG tablet Take 1 tablet (5 mg total) by mouth daily. Take as directed. 12/22/19   [name], MD  warfarin (COUMADIN) 6 MG tablet Take 6 mg by mouth daily.    [provider]    Allergies    Patient has no known allergies.  Review of Systems   Review of Systems  Constitutional: Negative for chills and fever.  Respiratory: Negative for shortness of breath.   Cardiovascular: Positive for leg  swelling. Negative for chest pain.  Gastrointestinal: Negative for abdominal pain, diarrhea, nausea and vomiting.  Genitourinary: Positive for flank pain. Negative for decreased urine volume, difficulty urinating, dysuria and hematuria.  All other systems reviewed and are negative.   Physical Exam Updated Vital Signs BP 132/88 (BP Location: Right Arm)   Pulse (!) 107   Temp 98.2 F (36.8 C) (Oral)   Resp 18   SpO2 97%   Physical Exam Vitals and nursing note reviewed.  Constitutional:      General: He is not in acute distress.    Appearance: He is not ill-appearing.  HENT:     Head: Normocephalic.  Eyes:     Pupils: Pupils are equal, round, and reactive to light.  Cardiovascular:     Rate and Rhythm: Normal rate and regular rhythm.     Pulses: Normal pulses.     Heart sounds: Normal heart sounds. No murmur. No friction rub. No gallop.   Pulmonary:     Effort: Pulmonary effort is normal.     Breath sounds: Normal breath sounds.  Abdominal:     General: Abdomen is flat. There is no distension.     Palpations: Abdomen is soft.     Tenderness: There is no abdominal tenderness. There is no right CVA tenderness, left CVA tenderness, guarding or rebound.     Comments: No CVA tenderness bilaterally.  Musculoskeletal:     Cervical back: Neck supple.     Comments: Able to move all 4 extremities without difficulty. LLE edema (2+ pitting edema). Distal pulses and sensation intact bilaterally.   Skin:    General: Skin is warm and dry.  Neurological:     General: No focal deficit present.     Mental Status: He is alert.  Psychiatric:        Mood and Affect: Mood normal.        Behavior: Behavior normal.     ED Results / Procedures / Treatments   Labs (all labs ordered are listed, but only abnormal results are displayed) Labs Reviewed  CBC WITH DIFFERENTIAL/PLATELET - Abnormal; Notable for the following components:      Result Value   RBC 4.20 (*)    Hemoglobin 12.5 (*)     All other components within normal limits  BASIC METABOLIC PANEL - Abnormal; Notable for the following components:   CO2 21 (*)    Glucose, Bld 101 (*)    BUN 61 (*)    Creatinine, Ser 2.91 (*)    GFR calc non Af Amer 22 (*)    GFR calc Af Amer 25 (*)    All other components within normal limits  URINALYSIS, ROUTINE W REFLEX MICROSCOPIC - Abnormal; Notable for the following components:   APPearance CLOUDY (*)    Hgb urine dipstick LARGE (*)    Leukocytes,Ua MODERATE (*)    All other components within normal limits  PROTIME-INR - Abnormal; Notable for the following components:   Prothrombin Time 24.7 (*)    INR 2.2 (*)    All other components within normal limits  URINE CULTURE    EKG None  Radiology CT Renal Stone Study  Result Date: 01/18/2020 CLINICAL DATA:  Flank pain. EXAM: CT ABDOMEN AND PELVIS WITHOUT CONTRAST TECHNIQUE: Multidetector CT imaging of the abdomen and pelvis was performed following the standard protocol without IV contrast. COMPARISON:  None. FINDINGS: Lower chest: The lung bases are clear. The heart size is normal. Hepatobiliary: The liver is normal. Normal gallbladder.There is no biliary ductal dilation. Pancreas: Normal contours without ductal dilatation. No peripancreatic fluid collection. Spleen: No splenic laceration or hematoma. Adrenals/Urinary Tract: --Adrenal glands: No adrenal hemorrhage. --Right kidney/ureter: Multiple nonobstructing stones are noted in the right kidney. --Left kidney/ureter: There are multiple nonobstructing stones in the left kidney. There is mild left-sided collecting system dilatation without evidence for an obstructing stone. Multiple simple cysts are noted. --Urinary bladder: Multiple stones are noted in the dependent portion of the urinary bladder. Stomach/Bowel: --Stomach/Duodenum: No hiatal hernia or other gastric abnormality. Normal duodenal course and caliber. --Small bowel: No dilatation or inflammation. --Colon: There is a large  amount of stool in the colon. --Appendix: Normal. Vascular/Lymphatic: Atherosclerotic calcification is present within the non-aneurysmal abdominal aorta, without hemodynamically significant stenosis. --No retroperitoneal lymphadenopathy. --No mesenteric lymphadenopathy. --No pelvic or inguinal lymphadenopathy. Reproductive: The prostate gland is enlarged. Other: No ascites or free air. There are bilateral fat containing inguinal hernias. Musculoskeletal. No acute displaced fractures. IMPRESSION: 1. Multiple stones are noted in the dependent portion of the urinary bladder. There is mild left-sided collecting system dilatation without evidence of an obstructing stone. Findings may represent sequela of a recently passed left-sided stone. 2. Bilateral nonobstructing nephrolithiasis. 3. Large amount of stool in the colon. Correlate for constipation. 4. Aortic Atherosclerosis (ICD10-I70.0). Electronically Signed   By: Constance Holster M.D.   On: 01/18/2020 18:20    Procedures Procedures (including critical care time)  Medications Ordered in ED Medications - No data to display  ED Course  I have reviewed the triage vital signs and the nursing notes.  Pertinent labs & imaging results that were available during my care of the patient were reviewed by me and considered in my medical decision making (see chart for details).  Clinical Course as of Jan 17 2341  Tue Jan 18, 2020  2129 INR(!): 2.2 [CA]    Clinical Course User Index [CA] Karie Kirks   MDM Rules/Calculators/A&P                     64 year old male presents to the ED for evaluation of left flank pain x3 days and worsening renal function per PCP follow-up. Patient afebrile. Per triage note, patient tachycardic at 108; however, during my initial evaluation, patient's HR at 92. Patient in no acute distress and non-ill appearing. Physical exam reassuring. 2+ pitting edema of LLE. Neurovascularly intact. Will obtain INR level to  ensure warfarin at therapeutic level. In regards to worsening renal function, will refer patient to to nephrology. Patient also advised to stop Metformin and diuretic and to follow-up with PCP to place him on another medication.   CBC reassuring with no leukocytosis.  BMP significant for AKI with creatinine at 2.91 and BUN at 61 (appears to be slightly worse than 3 weeks ago).  Renal study personally reviewed which demonstrates: IMPRESSION:  1. Multiple stones are noted in the  dependent portion of the urinary  bladder. There is mild left-sided collecting system dilatation  without evidence of an obstructing stone. Findings may represent  sequela of a recently passed left-sided stone.  2. Bilateral nonobstructing nephrolithiasis.  3. Large amount of stool in the colon. Correlate for constipation.  4. Aortic Atherosclerosis (ICD10-I70.0).   INR within therapeutic level. Doubt DVT at this time. Patient has a scheduled nephrology appointment in April. Advised patient to go to appointment. Instructed patient to stop Metformin and his diuretic and to call his PCP tomorrow to schedule an appointment for new medications. Stopping medications due to worsening renal function. Strict ED precautions discussed with patient. Patient states understanding and agrees to plan. Patient discharged home in no acute distress and stable vitals.  Discussed case with Dr. Gilford Raid who evaluated patient at bedside and agrees with assessment and plan.  Final Clinical Impression(s) / ED Diagnoses Final diagnoses:  Kidney stone  Worsening renal function    Rx / DC Orders ED Discharge Orders    None       Karie Kirks 01/18/20 2343    Isla Pence, MD 01/19/20 1546

## 2020-01-19 ENCOUNTER — Telehealth: Payer: Self-pay | Admitting: Family Medicine

## 2020-01-19 DIAGNOSIS — N184 Chronic kidney disease, stage 4 (severe): Secondary | ICD-10-CM

## 2020-01-19 DIAGNOSIS — I10 Essential (primary) hypertension: Secondary | ICD-10-CM

## 2020-01-19 NOTE — Telephone Encounter (Signed)
Dr. Kremer please advise message below.  

## 2020-01-19 NOTE — Telephone Encounter (Signed)
Patient states he went to ED yesterday and they recommended that he stop taking Spironolactone, Metformin and the other new diuretic (he did not remember name of that one) due to effects on kidneys. He needs replacement meds for those. Patient also stated Tylenol is not working for his arthritis. Please call him regarding these or speak to him at his already scheduled nurse visit Thursday morning.

## 2020-01-20 ENCOUNTER — Ambulatory Visit (INDEPENDENT_AMBULATORY_CARE_PROVIDER_SITE_OTHER): Payer: Medicare Other | Admitting: Behavioral Health

## 2020-01-20 DIAGNOSIS — Z86711 Personal history of pulmonary embolism: Secondary | ICD-10-CM | POA: Diagnosis not present

## 2020-01-20 DIAGNOSIS — Z7901 Long term (current) use of anticoagulants: Secondary | ICD-10-CM | POA: Diagnosis not present

## 2020-01-20 LAB — POCT INR: INR: 2.8 (ref 2.0–3.0)

## 2020-01-20 MED ORDER — LISINOPRIL 20 MG PO TABS: 20.0000 mg | ORAL_TABLET | Freq: Every day | ORAL | 1 refills | Status: DC

## 2020-01-20 NOTE — Telephone Encounter (Signed)
Patient aware of message below.

## 2020-01-20 NOTE — Patient Instructions (Addendum)
Per Dr. Ethelene Hal: Continue taking Coumadin 5 mg Wednesday and Friday. All other days take Coumadin 8 mg. Recheck INR in 4 weeks.

## 2020-01-20 NOTE — Progress Notes (Addendum)
Patient presents in office for INR recheck. He reported no changes in diet or medication. Patient voiced adherence to current medication regimen. All patient findings were negative. Today's INR reading was 2.8.  Per Dr. Ethelene Hal: Continue taking Coumadin 5 mg Wednesday and Friday. All other days take Coumadin 8 mg. Recheck INR in 4 weeks.  Patient was made aware of the provider's recommendations. He verbalized understanding and did not have any further questions or concerns.  Next appointment has been scheduled for 02/17/20 at 10:00 AM.  Agreed.

## 2020-01-25 ENCOUNTER — Other Ambulatory Visit: Payer: Self-pay

## 2020-01-26 ENCOUNTER — Encounter: Payer: Self-pay | Admitting: Family Medicine

## 2020-01-26 ENCOUNTER — Ambulatory Visit (INDEPENDENT_AMBULATORY_CARE_PROVIDER_SITE_OTHER): Payer: Medicare Other | Admitting: Family Medicine

## 2020-01-26 VITALS — BP 138/82 | HR 99 | Temp 96.8°F | Ht 73.0 in | Wt 244.4 lb

## 2020-01-26 DIAGNOSIS — Z794 Long term (current) use of insulin: Secondary | ICD-10-CM | POA: Diagnosis not present

## 2020-01-26 DIAGNOSIS — R0989 Other specified symptoms and signs involving the circulatory and respiratory systems: Secondary | ICD-10-CM | POA: Diagnosis not present

## 2020-01-26 DIAGNOSIS — E1165 Type 2 diabetes mellitus with hyperglycemia: Secondary | ICD-10-CM | POA: Diagnosis not present

## 2020-01-26 DIAGNOSIS — N184 Chronic kidney disease, stage 4 (severe): Secondary | ICD-10-CM | POA: Diagnosis not present

## 2020-01-26 DIAGNOSIS — N200 Calculus of kidney: Secondary | ICD-10-CM

## 2020-01-26 DIAGNOSIS — N179 Acute kidney failure, unspecified: Secondary | ICD-10-CM | POA: Diagnosis not present

## 2020-01-26 LAB — BASIC METABOLIC PANEL
BUN: 24 mg/dL — ABNORMAL HIGH (ref 6–23)
CO2: 25 mEq/L (ref 19–32)
Calcium: 8.3 mg/dL — ABNORMAL LOW (ref 8.4–10.5)
Chloride: 107 mEq/L (ref 96–112)
Creatinine, Ser: 1.73 mg/dL — ABNORMAL HIGH (ref 0.40–1.50)
GFR: 39.76 mL/min — ABNORMAL LOW (ref >60.00)
Glucose, Bld: 150 mg/dL — ABNORMAL HIGH (ref 70–99)
Potassium: 4.3 mEq/L (ref 3.5–5.1)
Sodium: 137 mEq/L (ref 135–145)

## 2020-01-26 NOTE — Progress Notes (Addendum)
Established Patient Office Visit  Subjective:  Patient ID: [name], male    DOB: Sep 02, 1956  Age: 64 y.o. MRN: [id number]  CC:  Chief Complaint  Patient presents with  . Hospitalization Follow-up    hospital f/u, questions about referral to nephrology and if he should stop some medications.     HPI [name] presents for hospital discharge follow-up for apparent acute kidney injury and hyperkalemia.  ER physician instructed patient to discontinue Metformin and's chlorthalidone.  Renal CT scan had shown multiple kidney stones in the patient's bladder and pelvis of the left kidney.  Patient tells me that he actually passed a stone when he was in the ER.  He has not started the lisinopril.  Has discontinued Metformin and chlorthalidone.  Assures me that he is discontinued the Indocin as well.  Continue 7030 mix twice daily and glipizide.  Appointment with endocrinology next month to hopefully switch to basal insulin. Ultram is leading to constipation.   Past Medical History:  Diagnosis Date  . Arthritis   . Diabetes mellitus without complication ([place])     History reviewed. No pertinent surgical history.  Family History  Problem Relation Age of Onset  . Cancer Mother   . Heart disease Father     Social History   Socioeconomic History  . Marital status: Legally Separated    Spouse name: Not on file  . Number of children: Not on file  . Years of education: Not on file  . Highest education level: Not on file  Occupational History  . Not on file  Tobacco Use  . Smoking status: Never Smoker  . Smokeless tobacco: Never Used  Substance and Sexual Activity  . Alcohol use: Yes    Alcohol/week: 1.0 standard drinks    Types: 1 Cans of beer per week    Comment: social  . Drug use: Never  . Sexual activity: Not on file  Other Topics Concern  . Not on file  Social History Narrative  . Not on file   Social Determinants of Health   Financial Resource Strain:   .  Difficulty of Paying Living Expenses:   Food Insecurity:   . Worried About Charity fundraiser in the Last Year:   . Arboriculturist in the Last Year:   Transportation Needs:   . Film/video editor (Medical):   [name] Lack of Transportation (Non-Medical):   Physical Activity:   . Days of Exercise per Week:   . Minutes of Exercise per Session:   Stress:   . Feeling of Stress :   Social Connections:   . Frequency of Communication with Friends and Family:   . Frequency of Social Gatherings with Friends and Family:   . Attends Religious Services:   . Active Member of Clubs or Organizations:   . Attends Archivist Meetings:   [name] Marital Status:   Intimate Partner Violence:   . Fear of Current or Ex-Partner:   . Emotionally Abused:   [name] Physically Abused:   . Sexually Abused:     Outpatient Medications Prior to Visit  Medication Sig Dispense Refill  . cilostazol (PLETAL) 100 MG tablet Take 100 mg by mouth 2 (two) times daily.    [name] gabapentin (NEURONTIN) 300 MG capsule Take 300 mg by mouth 3 (three) times daily.    [name] glipiZIDE (GLUCOTROL) 5 MG tablet Take by mouth daily before breakfast.    . HUMULIN 70/30 (70-30) 100 UNIT/ML injection INJECT 50  UNITS TWICE A DAY 20 mL 1  . lovastatin (MEVACOR) 40 MG tablet Take 40 mg by mouth at bedtime.    [name] warfarin (COUMADIN) 2 MG tablet Take 2 mg by mouth daily.    [name] warfarin (COUMADIN) 5 MG tablet Take 1 tablet (5 mg total) by mouth daily. Take as directed. 30 tablet 2  . warfarin (COUMADIN) 6 MG tablet Take 6 mg by mouth daily.    . chlorthalidone (HYGROTON) 25 MG tablet Take 1 tablet (25 mg total) by mouth daily. (Patient not taking: Reported on 01/26/2020) 30 tablet 1  . lisinopril (ZESTRIL) 20 MG tablet Take 1 tablet (20 mg total) by mouth daily. 30 tablet 1  . metFORMIN (GLUMETZA) 500 MG (MOD) 24 hr tablet Take 500 mg by mouth daily with breakfast.    . traMADol (ULTRAM) 50 MG tablet Take 50 mg by mouth every 6 (six) hours as needed.      No facility-administered medications prior to visit.    No Known Allergies  ROS Review of Systems  Constitutional: Negative.   HENT: Negative.   Respiratory: Negative.   Cardiovascular: Negative.   Gastrointestinal: Negative.   Genitourinary: Negative for difficulty urinating, frequency, hematuria and urgency.  Musculoskeletal: Positive for arthralgias.  Neurological: Negative for tremors and speech difficulty.  Psychiatric/Behavioral: Negative.       Objective:    Physical Exam  Constitutional: He is oriented to person, place, and time. He appears well-developed and well-nourished. No distress.  HENT:  Head: Normocephalic and atraumatic.  Right Ear: External ear normal.  Left Ear: External ear normal.  Eyes: Conjunctivae are normal. Right eye exhibits no discharge. Left eye exhibits no discharge. No scleral icterus.  Neck: No JVD present. No tracheal deviation present.  Cardiovascular: Normal rate, regular rhythm and normal heart sounds.  Pulses:      Dorsalis pedis pulses are 1+ on the right side and 1+ on the left side.       Posterior tibial pulses are 0 on the right side and 0 on the left side.  Pulmonary/Chest: Effort normal and breath sounds normal. No stridor.  Abdominal: There is no CVA tenderness.  Neurological: He is alert and oriented to person, place, and time.  Skin: Skin is warm and dry. He is not diaphoretic.  Psychiatric: He has a normal mood and affect.    BP 138/82   Pulse 99   Temp (!) 96.8 F (36 C) (Tympanic)   Ht 6\' 1"  (1.854 m)   Wt 244 lb 6.4 oz (110.9 kg)   SpO2 100%   BMI 32.24 kg/m  Wt Readings from Last 3 Encounters:  01/26/20 244 lb 6.4 oz (110.9 kg)  01/17/20 244 lb (110.7 kg)  01/05/20 240 lb (108.9 kg)     Health Maintenance Due  Topic Date Due  . Hepatitis C Screening  Never done  . FOOT EXAM  Never done  . OPHTHALMOLOGY EXAM  Never done  . HIV Screening  Never done  . TETANUS/TDAP  Never done  . COLONOSCOPY  Never  done  . PNA vac Low Risk Adult (1 of 2 - PCV13) Never done    There are no preventive care reminders to display for this patient.  Lab Results  Component Value Date   TSH 2.43 12/22/2019   Lab Results  Component Value Date   WBC 9.2 01/18/2020   HGB 12.5 (L) 01/18/2020   HCT 39.0 01/18/2020   MCV 92.9 01/18/2020   PLT 243 01/18/2020  Lab Results  Component Value Date   NA 137 01/26/2020   K 4.3 01/26/2020   CO2 25 01/26/2020   GLUCOSE 150 (H) 01/26/2020   BUN 24 (H) 01/26/2020   CREATININE 1.73 (H) 01/26/2020   BILITOT 0.4 12/22/2019   ALKPHOS 87 12/22/2019   AST 10 12/22/2019   ALT 11 12/22/2019   PROT 7.4 12/22/2019   ALBUMIN 4.5 12/22/2019   CALCIUM 8.3 (L) 01/26/2020   ANIONGAP 10 01/18/2020   GFR 39.76 (L) 01/26/2020   Lab Results  Component Value Date   CHOL 156 12/22/2019   Lab Results  Component Value Date   HDL 41.40 12/22/2019   No results found for: [facility] Lab Results  Component Value Date   TRIG 251.0 (H) 12/22/2019   Lab Results  Component Value Date   CHOLHDL 4 12/22/2019   Lab Results  Component Value Date   HGBA1C 8.8 (H) 12/22/2019      Assessment & Plan:   Problem List Items Addressed This Visit      Endocrine   Type 2 diabetes mellitus with hyperglycemia, with long-term current use of insulin (HCC)   Relevant Orders   Basic metabolic panel (Completed)     Genitourinary   CKD (chronic kidney disease) stage 4, GFR 15-29 ml/min (HCC) - Primary   Relevant Orders   Basic metabolic panel (Completed)   AKI (acute kidney injury) ([place])   Relevant Orders   Basic metabolic panel (Completed)   Renal lithiasis   Relevant Orders   Ambulatory referral to Urology     Other   Diminished pulses in lower extremity   Relevant Orders   VAS [place] ABI WITH/WO TBI      No orders of the defined types were placed in this encounter.   Follow-up: Return in about 2 weeks (around 02/09/2020), or start lisinopril.    [name],  MD

## 2020-01-29 ENCOUNTER — Ambulatory Visit: Payer: Medicare Other | Attending: Internal Medicine

## 2020-01-29 DIAGNOSIS — Z23 Encounter for immunization: Secondary | ICD-10-CM

## 2020-01-29 NOTE — Progress Notes (Signed)
   Covid-19 Vaccination Clinic  Name:  [name]    MRN: [id number] DOB: [birth year]  01/29/2020  [name] was observed post Covid-19 immunization for 15 minutes without incident. He was provided with Vaccine Information Sheet and instruction to access the V-Safe system.   [name] was instructed to call 911 with any severe reactions post vaccine: [name] Difficulty breathing  . Swelling of face and throat  . A fast heartbeat  . A bad rash all over body  . Dizziness and weakness   Immunizations Administered    Name Date Dose VIS Date Route   Pfizer COVID-19 Vaccine 01/29/2020  8:55 AM 0.3 mL 10/15/2019 Intramuscular   Manufacturer: [place]   Lot: [id number]   [place]: [address]-5

## 2020-01-30 ENCOUNTER — Other Ambulatory Visit: Payer: Self-pay | Admitting: Family Medicine

## 2020-01-30 DIAGNOSIS — N200 Calculus of kidney: Secondary | ICD-10-CM | POA: Insufficient documentation

## 2020-01-30 NOTE — Addendum Note (Signed)
Addended by: Jon Billings on: 01/30/2020 07:10 PM   Modules accepted: Orders

## 2020-02-03 ENCOUNTER — Telehealth: Payer: Self-pay | Admitting: Family Medicine

## 2020-02-03 ENCOUNTER — Other Ambulatory Visit: Payer: Self-pay

## 2020-02-03 ENCOUNTER — Ambulatory Visit (HOSPITAL_COMMUNITY)
Admission: RE | Admit: 2020-02-03 | Discharge: 2020-02-03 | Disposition: A | Discharge status: Home / Self Care | Payer: Medicare Other | Source: Ambulatory Visit | Attending: Family Medicine | Admitting: Family Medicine

## 2020-02-03 DIAGNOSIS — R0989 Other specified symptoms and signs involving the circulatory and respiratory systems: Secondary | ICD-10-CM | POA: Insufficient documentation

## 2020-02-03 NOTE — Telephone Encounter (Signed)
I spoke with Zenia Resides from [place] and verbally refilled needles.

## 2020-02-03 NOTE — Telephone Encounter (Signed)
Please refill his needles.

## 2020-02-03 NOTE — Telephone Encounter (Signed)
Pt called and asked if you could refill his pin needles for his Humulin, He said he pharmacy sent in refill request but hasnt heard anything, please advise

## 2020-02-04 ENCOUNTER — Ambulatory Visit: Payer: Medicare Other | Admitting: Family Medicine

## 2020-02-08 ENCOUNTER — Encounter (HOSPITAL_COMMUNITY): Payer: Self-pay

## 2020-02-08 ENCOUNTER — Observation Stay (HOSPITAL_COMMUNITY)
Admission: EM | Admit: 2020-02-08 | Discharge: 2020-02-09 | Disposition: A | Discharge status: Home / Self Care | Payer: Medicare Other | Attending: Internal Medicine | Admitting: Internal Medicine

## 2020-02-08 ENCOUNTER — Other Ambulatory Visit: Payer: Self-pay

## 2020-02-08 ENCOUNTER — Observation Stay (HOSPITAL_COMMUNITY): Payer: Medicare Other

## 2020-02-08 ENCOUNTER — Emergency Department (HOSPITAL_COMMUNITY): Payer: Medicare Other

## 2020-02-08 DIAGNOSIS — E860 Dehydration: Secondary | ICD-10-CM | POA: Diagnosis not present

## 2020-02-08 DIAGNOSIS — Z87442 Personal history of urinary calculi: Secondary | ICD-10-CM | POA: Insufficient documentation

## 2020-02-08 DIAGNOSIS — I959 Hypotension, unspecified: Secondary | ICD-10-CM | POA: Insufficient documentation

## 2020-02-08 DIAGNOSIS — Z79899 Other long term (current) drug therapy: Secondary | ICD-10-CM | POA: Diagnosis not present

## 2020-02-08 DIAGNOSIS — Z8249 Family history of ischemic heart disease and other diseases of the circulatory system: Secondary | ICD-10-CM | POA: Insufficient documentation

## 2020-02-08 DIAGNOSIS — Z7901 Long term (current) use of anticoagulants: Secondary | ICD-10-CM | POA: Diagnosis not present

## 2020-02-08 DIAGNOSIS — E1165 Type 2 diabetes mellitus with hyperglycemia: Secondary | ICD-10-CM | POA: Diagnosis not present

## 2020-02-08 DIAGNOSIS — I10 Essential (primary) hypertension: Secondary | ICD-10-CM

## 2020-02-08 DIAGNOSIS — R55 Syncope and collapse: Secondary | ICD-10-CM | POA: Diagnosis present

## 2020-02-08 DIAGNOSIS — M170 Bilateral primary osteoarthritis of knee: Secondary | ICD-10-CM | POA: Insufficient documentation

## 2020-02-08 DIAGNOSIS — E1122 Type 2 diabetes mellitus with diabetic chronic kidney disease: Secondary | ICD-10-CM | POA: Insufficient documentation

## 2020-02-08 DIAGNOSIS — Z7982 Long term (current) use of aspirin: Secondary | ICD-10-CM | POA: Diagnosis not present

## 2020-02-08 DIAGNOSIS — N200 Calculus of kidney: Secondary | ICD-10-CM

## 2020-02-08 DIAGNOSIS — N184 Chronic kidney disease, stage 4 (severe): Secondary | ICD-10-CM | POA: Diagnosis not present

## 2020-02-08 DIAGNOSIS — R1032 Left lower quadrant pain: Secondary | ICD-10-CM | POA: Insufficient documentation

## 2020-02-08 DIAGNOSIS — Z794 Long term (current) use of insulin: Secondary | ICD-10-CM

## 2020-02-08 DIAGNOSIS — Z20822 Contact with and (suspected) exposure to covid-19: Secondary | ICD-10-CM | POA: Diagnosis not present

## 2020-02-08 DIAGNOSIS — Z86711 Personal history of pulmonary embolism: Secondary | ICD-10-CM | POA: Diagnosis not present

## 2020-02-08 DIAGNOSIS — I1310 Hypertensive heart and chronic kidney disease without heart failure, with stage 1 through stage 4 chronic kidney disease, or unspecified chronic kidney disease: Secondary | ICD-10-CM | POA: Diagnosis not present

## 2020-02-08 DIAGNOSIS — N400 Enlarged prostate without lower urinary tract symptoms: Secondary | ICD-10-CM | POA: Insufficient documentation

## 2020-02-08 DIAGNOSIS — E785 Hyperlipidemia, unspecified: Secondary | ICD-10-CM | POA: Insufficient documentation

## 2020-02-08 HISTORY — DX: Personal history of pulmonary embolism: Z86.711

## 2020-02-08 HISTORY — DX: Essential (primary) hypertension: I10

## 2020-02-08 HISTORY — DX: Benign prostatic hyperplasia without lower urinary tract symptoms: N40.0

## 2020-02-08 HISTORY — DX: Hyperlipidemia, unspecified: E78.5

## 2020-02-08 LAB — CBC WITH DIFFERENTIAL/PLATELET
Abs Immature Granulocytes: 0.06 10*3/uL (ref 0.00–0.07)
Basophils Absolute: 0.1 10*3/uL (ref 0.0–0.1)
Basophils Relative: 1 %
Eosinophils Absolute: 0.4 10*3/uL (ref 0.0–0.5)
Eosinophils Relative: 5 %
HCT: 35.4 % — ABNORMAL LOW (ref 39.0–52.0)
Hemoglobin: 11.5 g/dL — ABNORMAL LOW (ref 13.0–17.0)
Immature Granulocytes: 1 %
Lymphocytes Relative: 13 %
Lymphs Abs: 1.2 10*3/uL (ref 0.7–4.0)
MCH: 30.2 pg (ref 26.0–34.0)
MCHC: 32.5 g/dL (ref 30.0–36.0)
MCV: 92.9 fL (ref 80.0–100.0)
Monocytes Absolute: 0.6 10*3/uL (ref 0.1–1.0)
Monocytes Relative: 7 %
Neutro Abs: 6.9 10*3/uL (ref 1.7–7.7)
Neutrophils Relative %: 73 %
Platelets: 189 10*3/uL (ref 150–400)
RBC: 3.81 MIL/uL — ABNORMAL LOW (ref 4.22–5.81)
RDW: 13 % (ref 11.5–15.5)
WBC: 9.1 10*3/uL (ref 4.0–10.5)
nRBC: 0 % (ref 0.0–0.2)

## 2020-02-08 LAB — CBG MONITORING, ED: Glucose-Capillary: 194 mg/dL — ABNORMAL HIGH (ref 70–99)

## 2020-02-08 LAB — COMPREHENSIVE METABOLIC PANEL
ALT: 18 U/L (ref 0–44)
AST: 14 U/L — ABNORMAL LOW (ref 15–41)
Albumin: 3.4 g/dL — ABNORMAL LOW (ref 3.5–5.0)
Alkaline Phosphatase: 67 U/L (ref 38–126)
Anion gap: 9 (ref 5–15)
BUN: 41 mg/dL — ABNORMAL HIGH (ref 8–23)
CO2: 18 mmol/L — ABNORMAL LOW (ref 22–32)
Calcium: 8.6 mg/dL — ABNORMAL LOW (ref 8.9–10.3)
Chloride: 110 mmol/L (ref 98–111)
Creatinine, Ser: 2.37 mg/dL — ABNORMAL HIGH (ref 0.61–1.24)
GFR calc Af Amer: 32 mL/min — ABNORMAL LOW (ref >60)
GFR calc non Af Amer: 28 mL/min — ABNORMAL LOW (ref >60)
Glucose, Bld: 214 mg/dL — ABNORMAL HIGH (ref 70–99)
Potassium: 5.2 mmol/L — ABNORMAL HIGH (ref 3.5–5.1)
Sodium: 137 mmol/L (ref 135–145)
Total Bilirubin: 0.5 mg/dL (ref 0.3–1.2)
Total Protein: 6.7 g/dL (ref 6.5–8.1)

## 2020-02-08 LAB — TROPONIN I (HIGH SENSITIVITY)
Troponin I (High Sensitivity): 5 ng/L (ref <18)
Troponin I (High Sensitivity): 6 ng/L (ref <18)

## 2020-02-08 LAB — MAGNESIUM: Magnesium: 2.1 mg/dL (ref 1.7–2.4)

## 2020-02-08 LAB — CK: Total CK: 80 U/L (ref 49–397)

## 2020-02-08 LAB — PHOSPHORUS: Phosphorus: 2.8 mg/dL (ref 2.5–4.6)

## 2020-02-08 LAB — LACTIC ACID, PLASMA: Lactic Acid, Venous: 1 mmol/L (ref 0.5–1.9)

## 2020-02-08 LAB — PROTIME-INR
INR: 1.4 — ABNORMAL HIGH (ref 0.8–1.2)
Prothrombin Time: 17.1 seconds — ABNORMAL HIGH (ref 11.4–15.2)

## 2020-02-08 LAB — BRAIN NATRIURETIC PEPTIDE: B Natriuretic Peptide: 11.3 pg/mL (ref 0.0–100.0)

## 2020-02-08 MED ORDER — SODIUM CHLORIDE 0.9 % IV BOLUS
1000.0000 mL | Freq: Once | INTRAVENOUS | Status: AC
  Administered 2020-02-08: 1000 mL via INTRAVENOUS

## 2020-02-08 NOTE — H&P (Signed)
[name] TKZ:[id number] DOB: [birth year] DOA: 02/08/2020    PCP: [name], MD   Outpatient Specialists:  none    Patient arrived to ER on 02/08/20 at 1817  Patient coming from: home Lives   With family    Chief Complaint:   Chief Complaint  Patient presents with  . Loss of Consciousness    HPI: [name] is a 64 y.o. male with medical history significant of kidney stones DM 2, CKD stage 3, HTN, hx of PE 20 years ago     Presented with  Syncope that occured today  Was sitting in the car with his wife and passed out, he appeared unresponsive  drooling, no seizure activity Reports he took humuline 70/30 bid and took 30 units in AM he thought maybe his BG ws low so his wife gave hime some candy it make him feel a bit better but not completly by the time EMS arrived his BG was unremarkable.  He has been working on his car all day, reports nothing strenuous, reported sensation of lactic acid burn in all his body Pt reports he felt lightheaded prior to syncope,   He occasionally still passes a kidney stone last kidney stone was yesterday  Was hypotensive down to 4 on EMS arrival he was given 1L NS bolus At baseline his BP is in 130's He has not been on any narcotic lately  Was seen at [facility] on 01/26/20 found to have multiple kidney stones in the patient's bladder and pelvis of the left kidney on  Renal CT His arthiritis medications has been stopped recently.  Reports no use of Etoh does not smoke   Report frequently if he is bend over for a long time and then stands up  He gets light headed. If he walks up a hill he gets winded.   Infectious risk factors:  Reports  fatigue     Has   been vaccinated against COVID second shot 01/29/2020   in house  PCR testing  Pending  No results found for: SARSCOV2NAA   Regarding pertinent Chronic problems:     Hyperlipidemia -  on statins lovastatin   HTN on lisinopril started 2-3 wks ago    DM 2 -  Lab Results    Component Value Date   HGBA1C 8.8 (H) 12/22/2019  on insulin    obesity-   BMI Readings from Last 1 Encounters:  02/08/20 31.66 kg/m    hx of PE 10 years ago still on coumadin 8 mg on M,T,hsatSun and W and F take 5    CKD stage III - baseline Cr 1.7 Lab Results  Component Value Date   CREATININE 2.37 (H) 02/08/2020   CREATININE 1.73 (H) 01/26/2020   CREATININE 2.91 (H) 01/18/2020     While in ER: CXR - non acute trop non ischemic   Hospitalist was called for admission for syncope  The following Work up has been ordered so far:  Orders Placed This Encounter  Procedures  . SARS CORONAVIRUS 2 (TAT 6-24 HRS) Nasopharyngeal Nasopharyngeal Swab  . DG Chest 2 View  . Comprehensive metabolic panel  . CBC with Differential  . Cardiac monitoring  . Consult to hospitalist  ALL PATIENTS BEING ADMITTED/HAVING PROCEDURES NEED COVID-19 SCREENING  . POC CBG, ED  . EKG 12-Lead  . ED EKG    Following Medications were ordered in ER: Medications  sodium chloride 0.9 % bolus 1,000 mL (0 mLs Intravenous Stopped 02/08/20 1952)  Consult Orders  (From admission, onward)         Start     Ordered   02/08/20 1955  Consult to hospitalist  ALL PATIENTS BEING ADMITTED/HAVING PROCEDURES NEED COVID-19 SCREENING  Once    Comments: ALL PATIENTS BEING ADMITTED/HAVING PROCEDURES NEED COVID-19 SCREENING  Provider:  (Not yet assigned)  Question Answer Comment  Place call to: [facility]   Reason for Consult Admit      02/08/20 1954          Significant initial  Findings: Abnormal Labs Reviewed  COMPREHENSIVE METABOLIC PANEL - Abnormal; Notable for the following components:      Result Value   Potassium 5.2 (*)    CO2 18 (*)    Glucose, Bld 214 (*)    BUN 41 (*)    Creatinine, Ser 2.37 (*)    Calcium 8.6 (*)    Albumin 3.4 (*)    AST 14 (*)    GFR calc non Af Amer 28 (*)    GFR calc Af Amer 32 (*)    All other components within normal limits  CBC WITH  DIFFERENTIAL/PLATELET - Abnormal; Notable for the following components:   RBC 3.81 (*)    Hemoglobin 11.5 (*)    HCT 35.4 (*)    All other components within normal limits  PROTIME-INR - Abnormal; Notable for the following components:   Prothrombin Time 17.1 (*)    INR 1.4 (*)    All other components within normal limits  CBG MONITORING, ED - Abnormal; Notable for the following components:   Glucose-Capillary 194 (*)    All other components within normal limits     Otherwise labs showing:    Recent Labs  Lab 02/08/20 1849 02/08/20 2130  NA 137  --   K 5.2*  --   CO2 18*  --   GLUCOSE 214*  --   BUN 41*  --   CREATININE 2.37*  --   CALCIUM 8.6*  --   MG  --  2.1  PHOS  --  2.8    Cr    Up from baseline see below Lab Results  Component Value Date   CREATININE 2.37 (H) 02/08/2020   CREATININE 1.73 (H) 01/26/2020   CREATININE 2.91 (H) 01/18/2020    Recent Labs  Lab 02/08/20 1849  AST 14*  ALT 18  ALKPHOS 67  BILITOT 0.5  PROT 6.7  ALBUMIN 3.4*   Lab Results  Component Value Date   CALCIUM 8.6 (L) 02/08/2020     WBC      Component Value Date/Time   WBC 9.1 02/08/2020 1849   ANC    Component Value Date/Time   NEUTROABS 6.9 02/08/2020 1849     Plt: Lab Results  Component Value Date   PLT 189 02/08/2020     Lactic Acid, Venous    Component Value Date/Time   LATICACIDVEN 1.0 02/08/2020 2130      HG/HCT  stable,      Component Value Date/Time   HGB 11.5 (L) 02/08/2020 1849   HCT 35.4 (L) 02/08/2020 1849     Troponin 5  Cardiac Panel (last 3 results) Recent Labs    02/08/20 2130  CKTOTAL 80     ECG: Ordered Personally reviewed by me showing: HR : 90  Rhythm:  NSR   no evidence of ischemic changes QTC 436   BNP (last 3 results) Recent Labs    02/08/20 1849  BNP 11.3  DM  labs:  HbA1C: Recent Labs    12/22/19 0949  HGBA1C 8.8*       CBG (last 3)  Recent Labs    02/08/20 1852  GLUCAP 194*     UA   ordered      CXR - NON acute Renal CT non hydronephrosis. Non acute    ED Triage Vitals  Enc Vitals Group     BP 02/08/20 1822 (!) 103/57     Pulse Rate 02/08/20 1822 87     Resp 02/08/20 1822 20     Temp 02/08/20 1822 97.9 F (36.6 C)     Temp Source 02/08/20 1822 Oral     SpO2 02/08/20 1822 96 %     Weight 02/08/20 1824 240 lb (108.9 kg)     Height 02/08/20 1824 6\' 1"  (1.854 m)     Head Circumference --      Peak Flow --      Pain Score 02/08/20 1824 7     Pain Loc --      Pain Edu? --      Excl. in [place]? --   TMAX(24)@       Latest  Blood pressure 121/72, pulse 91, temperature 97.9 F (36.6 C), temperature source Oral, resp. rate 17, height 6\' 1"  (1.854 m), weight 108.9 kg, SpO2 100 %.    Review of Systems:    Pertinent positives include: syncope  Constitutional:  No weight loss, night sweats, Fevers, chills, fatigue, weight loss  HEENT:  No headaches, Difficulty swallowing,Tooth/dental problems,Sore throat,  No sneezing, itching, ear ache, nasal congestion, post nasal drip,  Cardio-vascular:  No chest pain, Orthopnea, PND, anasarca, dizziness, palpitations.no Bilateral lower extremity swelling  GI:  No heartburn, indigestion, abdominal pain, nausea, vomiting, diarrhea, change in bowel habits, loss of appetite, melena, blood in stool, hematemesis Resp:  no shortness of breath at rest. No dyspnea on exertion, No excess mucus, no productive cough, No non-productive cough, No coughing up of blood.No change in color of mucus.No wheezing. Skin:  no rash or lesions. No jaundice GU:  no dysuria, change in color of urine, no urgency or frequency. No straining to urinate.  No flank pain.  Musculoskeletal:  No joint pain or no joint swelling. No decreased range of motion. No back pain.  Psych:  No change in mood or affect. No depression or anxiety. No memory loss.  Neuro: no localizing neurological complaints, no tingling, no weakness, no double vision, no gait abnormality, no slurred  speech, no confusion  All systems reviewed and apart from [place] all are negative  Past Medical History:   Past Medical History:  Diagnosis Date  . Arthritis   . Diabetes mellitus without complication ([place])   . History of pulmonary embolus (PE)     No past surgical history on file.  Social History:  Ambulatory   Independently      reports that he has never smoked. He has never used smokeless tobacco. He reports current alcohol use of about 1.0 standard drinks of alcohol per week. He reports that he does not use drugs.     Family History:   Family History  Problem Relation Age of Onset  . Cancer Mother   . Heart disease Father     Allergies: No Known Allergies   Prior to Admission medications   Medication Sig Start Date End Date Taking? Authorizing Provider  chlorthalidone (HYGROTON) 25 MG tablet Take 1 tablet (25 mg total) by mouth daily. Patient not  taking: Reported on 01/26/2020 01/17/20   [name], MD  cilostazol (PLETAL) 100 MG tablet Take 100 mg by mouth 2 (two) times daily.    [provider]  gabapentin (NEURONTIN) 300 MG capsule Take 300 mg by mouth 3 (three) times daily.    [provider]  glipiZIDE (GLUCOTROL) 5 MG tablet Take by mouth daily before breakfast.    [provider]  HUMULIN 70/30 (70-30) 100 UNIT/ML injection INJECT 50 UNITS TWICE A DAY 01/31/20   [name], MD  lisinopril (ZESTRIL) 20 MG tablet Take 1 tablet (20 mg total) by mouth daily. 01/20/20   [name], MD  lovastatin (MEVACOR) 40 MG tablet Take 40 mg by mouth at bedtime.    [provider]  metFORMIN (GLUMETZA) 500 MG (MOD) 24 hr tablet Take 500 mg by mouth daily with breakfast.    [provider]  traMADol (ULTRAM) 50 MG tablet Take 50 mg by mouth every 6 (six) hours as needed.    [provider]  warfarin (COUMADIN) 2 MG tablet Take 2 mg by mouth daily.    [provider]  warfarin  (COUMADIN) 5 MG tablet Take 1 tablet (5 mg total) by mouth daily. Take as directed. 12/22/19   [name], MD  warfarin (COUMADIN) 6 MG tablet Take 6 mg by mouth daily.    [provider]   Physical Exam: Blood pressure 121/72, pulse 91, temperature 97.9 F (36.6 C), temperature source Oral, resp. rate 17, height 6\' 1"  (1.854 m), weight 108.9 kg, SpO2 100 %. 1. General:  in No  Acute distress   Chronically ill  -appearing 2. Psychological: Alert and  Oriented 3. Head/ENT:    Dry Mucous Membranes                          Head Non traumatic, neck supple                          Normal  Dentition 4. SKIN: decreased Skin turgor,  Skin clean Dry and intact no rash 5. Heart: Regular rate and rhythm no  Murmur, no Rub or gallop 6. Lungs: Clear to auscultation bilaterally, no wheezes or crackles   7. Abdomen: Soft,  non-tender, Non distended  obese  bowel sounds present 8. Lower extremities: no clubbing, cyanosis, >R edema 9. Neurologically Grossly intact, moving all 4 extremities equally   10. MSK: Normal range of motion  All other LABS:     Recent Labs  Lab 02/08/20 1849  WBC 9.1  NEUTROABS 6.9  HGB 11.5*  HCT 35.4*  MCV 92.9  PLT 189     Recent Labs  Lab 02/08/20 1849  NA 137  K 5.2*  CL 110  CO2 18*  GLUCOSE 214*  BUN 41*  CREATININE 2.37*  CALCIUM 8.6*     Recent Labs  Lab 02/08/20 1849  AST 14*  ALT 18  ALKPHOS 67  BILITOT 0.5  PROT 6.7  ALBUMIN 3.4*      Cultures: No results found for: SDES, SPECREQUEST, CULT, REPTSTATUS   Radiological Exams on Admission: DG Chest 2 View  Result Date: 02/08/2020 CLINICAL DATA:  Syncope today. EXAM: CHEST - 2 VIEW COMPARISON:  None. FINDINGS: The heart size and mediastinal contours are within normal limits. Both lungs are clear. The visualized skeletal structures are unremarkable. IMPRESSION: Normal exam. Electronically Signed   By: Lorriane Shire  M.D.   On: 02/08/2020 19:23    Chart has been  reviewed   Assessment/Plan  64 y.o. male with medical history significant of kidney stones DM 2, CKD stage 3, HTN, hx of PE 20 years ago  Admitted for syncope  Present on Admission: . Syncope -  given risk factor will admit rehydrate obtain CE, monitor on tele and obtain carotid dopplers, echo , cardiology consult in AM given risk factors no chest pain or shortness of breath to suggest PE patient already on anticoagulation will continue  . History of pulmonary embolism -currently on Coumadin, subtherapeutic INR Restart Coumadin per pharmacy  Patient reported left lower extremity swelling for the past 1 month will obtain Dopplers as patient INR has been fluctuating   . Essential hypertension stable hold lisinopril given slightly elevated creatinine  . Acute on CKD (chronic kidney disease) stage 4, GFR 15-29 ml/min (HCC) a bit up from baseline obtain urine electrolytes monitor with gentle rehydration avoid nephrotoxic medications   DM2 -  - Order Sensitive   SSI   -  switch to   Lantus 10 units, adjust as needed monitor blood sugars  -  check TSH and HgA1C  - Hold by mouth medications   History of nephrolithiasis CT renal showed no evidence of hydronephrosis continue to monitor patient plan to follow-up urology as an outpatient   Other plan as per orders.  DVT prophylaxis: on coumadin    Code Status:  FULL CODE  as per patient  I had personally discussed CODE STATUS with patient    Family Communication:   Family not at  Bedside    Disposition Plan:    To home once workup is complete and patient is stable    Following barriers for discharge:                            Electrolytes corrected                           Will need consultants to evaluate patient prior to discharge                     Would benefit from PT/OT eval prior to DC  Ordered                                        Consults called:  email cardiology     Admission status:  ED Disposition    None       Obs      Level of care      tele  For 24H        Precautions: admitted as asymptomatic screening protocol    PPE: Used by the provider:   P100  eye Goggles,  Gloves       Raja Liska 02/08/2020, 9:24 PM   [facility]     after 2 AM please page floor coverage PA If 7AM-7PM, please contact the day team taking care of the patient using [email or web address]   Patient was evaluated in the context of the global COVID-19 pandemic, which necessitated consideration that the patient might be at risk for infection with the SARS-CoV-2 virus that causes COVID-19. Institutional protocols and algorithms that pertain to the evaluation of patients at risk for COVID-19 are in a state of  rapid change based on information released by regulatory bodies including the CDC and federal and state organizations. These policies and algorithms were followed during the patient's care.

## 2020-02-08 NOTE — ED Provider Notes (Signed)
[facility] Provider Note   CSN: [id number] Arrival date & time: 02/08/20  1817     History Chief Complaint  Patient presents with  . Loss of Consciousness    [name] is a 64 y.o. male with a past medical history of diabetes, CKD, prior PE, hypertension, currently anticoagulated on Coumadin presenting to the ED with a chief complaint of syncope.  States that he was trying to work on his car today when he suddenly felt generally weak" like I was moving through molasses."  He then tried to sit and believes he had a brief syncopal episode while on the couch for 1 to 2 minutes.  He initially thought it was due to his blood sugar being low, so he ate a few pieces of candy.  He only endorses generalized pain due to his history of arthritis but denies any worsening pain.  Denies any chest pain, headache, vision changes, vomiting, shortness of breath, diarrhea, hemoptysis. He has noticed left lower extremity edema that has gradually worsened over the past month.  Symptoms initially started bilaterally and was told it could be due to his chronic kidney disease. He was placed on lisinopril 2 weeks ago.  HPI     Past Medical History:  Diagnosis Date  . Arthritis   . Diabetes mellitus without complication ([place])   . History of pulmonary embolus ([place])     Patient Active Problem List   Diagnosis Date Noted  . Renal lithiasis 01/30/2020  . Diminished pulses in lower extremity 01/26/2020  . AKI (acute kidney injury) ([place]) 01/26/2020  . CKD (chronic kidney disease) stage 4, GFR 15-29 ml/min ([facility]) 01/05/2020  . Hyperkalemia 01/05/2020  . Long term (current) use of anticoagulants 12/22/2019  . Elevated cholesterol 12/17/2019  . Arthritis of left knee 12/17/2019  . Arthritis 12/17/2019  . Type 2 diabetes mellitus with hyperglycemia, with long-term current use of insulin ([place]) 12/17/2019  . History of pulmonary embolism 12/17/2019  . Essential hypertension  12/17/2019    No past surgical history on file.     Family History  Problem Relation Age of Onset  . Cancer Mother   . Heart disease Father     Social History   Tobacco Use  . Smoking status: Never Smoker  . Smokeless tobacco: Never Used  Substance Use Topics  . Alcohol use: Yes    Alcohol/week: 1.0 standard drinks    Types: 1 Cans of beer per week    Comment: social  . Drug use: Never    Home Medications Prior to Admission medications   Medication Sig Start Date End Date Taking? Authorizing Provider  chlorthalidone (HYGROTON) 25 MG tablet Take 1 tablet (25 mg total) by mouth daily. Patient not taking: Reported on 01/26/2020 01/17/20   [name], MD  cilostazol (PLETAL) 100 MG tablet Take 100 mg by mouth 2 (two) times daily.    [provider]  gabapentin (NEURONTIN) 300 MG capsule Take 300 mg by mouth 3 (three) times daily.    [provider]  glipiZIDE (GLUCOTROL) 5 MG tablet Take by mouth daily before breakfast.    [provider]  HUMULIN 70/30 (70-30) 100 UNIT/ML injection INJECT 50 UNITS TWICE A DAY 01/31/20   [name], MD  lisinopril (ZESTRIL) 20 MG tablet Take 1 tablet (20 mg total) by mouth daily. 01/20/20   [name], MD  lovastatin (MEVACOR) 40 MG tablet Take 40 mg by mouth at bedtime.  [provider]  metFORMIN (GLUMETZA) 500 MG (MOD) 24 hr tablet Take 500 mg by mouth daily with breakfast.    [provider]  traMADol (ULTRAM) 50 MG tablet Take 50 mg by mouth every 6 (six) hours as needed.    [provider]  warfarin (COUMADIN) 2 MG tablet Take 2 mg by mouth daily.    [provider]  warfarin (COUMADIN) 5 MG tablet Take 1 tablet (5 mg total) by mouth daily. Take as directed. 12/22/19   [name], MD  warfarin (COUMADIN) 6 MG tablet Take 6 mg by mouth daily.    [provider]    Allergies    Patient has no known allergies.  Review of  Systems   Review of Systems  Constitutional: Negative for appetite change, chills and fever.  HENT: Negative for ear pain, rhinorrhea, sneezing and sore throat.   Eyes: Negative for photophobia and visual disturbance.  Respiratory: Negative for cough, chest tightness, shortness of breath and wheezing.   Cardiovascular: Negative for chest pain and palpitations.  Gastrointestinal: Negative for abdominal pain, blood in stool, constipation, diarrhea, nausea and vomiting.  Genitourinary: Negative for dysuria, hematuria and urgency.  Musculoskeletal: Positive for myalgias.  Skin: Negative for rash.  Neurological: Positive for syncope. Negative for dizziness, weakness and light-headedness.    Physical Exam Updated Vital Signs BP 121/72   Pulse 91   Temp 97.9 F (36.6 C) (Oral)   Resp 17   Ht 6\' 1"  (1.854 m)   Wt 108.9 kg   SpO2 100%   BMI 31.66 kg/m   Physical Exam Vitals and nursing note reviewed.  Constitutional:      General: He is not in acute distress.    Appearance: He is well-developed.  HENT:     Head: Normocephalic and atraumatic.     Nose: Nose normal.  Eyes:     General: No scleral icterus.       Right eye: No discharge.        Left eye: No discharge.     Conjunctiva/sclera: Conjunctivae normal.     Pupils: Pupils are equal, round, and reactive to light.  Cardiovascular:     Rate and Rhythm: Normal rate and regular rhythm.     Heart sounds: Normal heart sounds. No murmur. No friction rub. No gallop.   Pulmonary:     Effort: Pulmonary effort is normal. No respiratory distress.     Breath sounds: Normal breath sounds.  Abdominal:     General: Bowel sounds are normal. There is no distension.     Palpations: Abdomen is soft.     Tenderness: There is no abdominal tenderness. There is no guarding.  Musculoskeletal:        General: Normal range of motion.     Cervical back: Normal range of motion and neck supple.  Skin:    General: Skin is warm and dry.      Findings: No rash.  Neurological:     General: No focal deficit present.     Mental Status: He is alert and oriented to person, place, and time.     Cranial Nerves: No cranial nerve deficit.     Sensory: No sensory deficit.     Motor: No weakness or abnormal muscle tone.     Coordination: Coordination normal.     ED Results / Procedures / Treatments   Labs (all labs ordered are listed, but only abnormal results are displayed) Labs Reviewed  COMPREHENSIVE METABOLIC PANEL -  Abnormal; Notable for the following components:      Result Value   Potassium 5.2 (*)    CO2 18 (*)    Glucose, Bld 214 (*)    BUN 41 (*)    Creatinine, Ser 2.37 (*)    Calcium 8.6 (*)    Albumin 3.4 (*)    AST 14 (*)    GFR calc non Af Amer 28 (*)    GFR calc Af Amer 32 (*)    All other components within normal limits  CBC WITH DIFFERENTIAL/PLATELET - Abnormal; Notable for the following components:   RBC 3.81 (*)    Hemoglobin 11.5 (*)    HCT 35.4 (*)    All other components within normal limits  CBG MONITORING, ED - Abnormal; Notable for the following components:   Glucose-Capillary 194 (*)    All other components within normal limits  SARS CORONAVIRUS 2 (TAT 6-24 HRS)  TROPONIN I (HIGH SENSITIVITY)  TROPONIN I (HIGH SENSITIVITY)    EKG EKG Interpretation  Date/Time:  Tuesday February 08 2020 18:34:16 EDT Ventricular Rate:  90 PR Interval:    QRS Duration: 92 QT Interval:  356 QTC Calculation: 436 R Axis:   -9 Text Interpretation: Sinus rhythm Low voltage, precordial leads No old tracing to compare Confirmed by Isla Pence [id number]) on 02/08/2020 6:38:17 PM   Radiology DG Chest 2 View  Result Date: 02/08/2020 CLINICAL DATA:  Syncope today. EXAM: CHEST - 2 VIEW COMPARISON:  None. FINDINGS: The heart size and mediastinal contours are within normal limits. Both lungs are clear. The visualized skeletal structures are unremarkable. IMPRESSION: Normal exam. Electronically Signed   By: Lorriane Shire  M.D.   On: 02/08/2020 19:23    Procedures Procedures (including critical care time)  Medications Ordered in ED Medications  sodium chloride 0.9 % bolus 1,000 mL (0 mLs Intravenous Stopped 02/08/20 1952)    ED Course  I have reviewed the triage vital signs and the nursing notes.  Pertinent labs & imaging results that were available during my care of the patient were reviewed by me and considered in my medical decision making (see chart for details).    MDM Rules/Calculators/A&P                      64 year old male with past medical history of diabetes, CKD, hypertension currently anticoagulated on Coumadin presents to ED after syncopal episode that occurred prior to arrival.  He states that he was trying to work on his car today when he suddenly felt generally weak and slowed.  He did have a syncopal episode while he was sitting on the couch after he had the sensation.  Denies any chest pain prior or current, headache, vision changes, vomiting, shortness of breath.  Denies history of similar symptoms in the past.  Denies any head injury.  On exam patient is alert and oriented x4.  He does have some left lower extremity edema that he was told could be due to his chronic kidney disease but did have a negative ultrasound about 1 week ago.  He is not tachycardic, tachypneic or hypoxic.  No signs of respiratory distress.  Speaking in complete sentence without difficulty.  No deficits neurological exam noted.  EKG shows sinus rhythm.  Chest x-ray without any acute findings.  CMP showing creatinine of 2.3 which is similar to baseline.  CBC unremarkable.  Troponin is negative.  Due to patient's comorbidities and concern for this syncope, feel that he will  benefit from admission for further management and work-up.  Will consult hospitalist service for admission.  Final Clinical Impression(s) / ED Diagnoses Final diagnoses:  Syncope and collapse    Rx / DC Orders ED Discharge Orders    None       Portions of this note were generated with Dragon dictation software. Dictation errors may occur despite best attempts at proofreading.    Delia Heady, PA-C 02/08/20 2022    Isla Pence, MD 02/08/20 2052

## 2020-02-08 NOTE — ED Triage Notes (Signed)
Pt arrived via [facility] from home c/o syncopal episode. Pt was working on his car, became light headed, sat down, pt's wife states he possibly had a syncopal episode in the chair when he sat down. Pt initially thought his BS was low, his wife gave him a couple pieces of candy and called EMS. EMS states initially pt's BP was low in the 02'I systolic. Pt received 1000 ml NS bolus with EMS. Pt c/o of pain all over due to arthritis medications being stopped a couple weeks prior.

## 2020-02-09 ENCOUNTER — Observation Stay (HOSPITAL_BASED_OUTPATIENT_CLINIC_OR_DEPARTMENT_OTHER): Payer: Medicare Other

## 2020-02-09 ENCOUNTER — Encounter (HOSPITAL_COMMUNITY): Payer: Self-pay | Admitting: Internal Medicine

## 2020-02-09 ENCOUNTER — Ambulatory Visit: Payer: Medicare Other

## 2020-02-09 DIAGNOSIS — N189 Chronic kidney disease, unspecified: Secondary | ICD-10-CM

## 2020-02-09 DIAGNOSIS — E860 Dehydration: Secondary | ICD-10-CM | POA: Diagnosis not present

## 2020-02-09 DIAGNOSIS — R609 Edema, unspecified: Secondary | ICD-10-CM

## 2020-02-09 DIAGNOSIS — Z20822 Contact with and (suspected) exposure to covid-19: Secondary | ICD-10-CM | POA: Diagnosis not present

## 2020-02-09 DIAGNOSIS — N179 Acute kidney failure, unspecified: Secondary | ICD-10-CM

## 2020-02-09 DIAGNOSIS — I959 Hypotension, unspecified: Secondary | ICD-10-CM | POA: Diagnosis not present

## 2020-02-09 DIAGNOSIS — I10 Essential (primary) hypertension: Secondary | ICD-10-CM | POA: Diagnosis not present

## 2020-02-09 DIAGNOSIS — R55 Syncope and collapse: Secondary | ICD-10-CM

## 2020-02-09 LAB — COMPREHENSIVE METABOLIC PANEL
ALT: 18 U/L (ref 0–44)
AST: 12 U/L — ABNORMAL LOW (ref 15–41)
Albumin: 3.6 g/dL (ref 3.5–5.0)
Alkaline Phosphatase: 70 U/L (ref 38–126)
Anion gap: 9 (ref 5–15)
BUN: 36 mg/dL — ABNORMAL HIGH (ref 8–23)
CO2: 19 mmol/L — ABNORMAL LOW (ref 22–32)
Calcium: 8.5 mg/dL — ABNORMAL LOW (ref 8.9–10.3)
Chloride: 111 mmol/L (ref 98–111)
Creatinine, Ser: 2.1 mg/dL — ABNORMAL HIGH (ref 0.61–1.24)
GFR calc Af Amer: 37 mL/min — ABNORMAL LOW (ref >60)
GFR calc non Af Amer: 32 mL/min — ABNORMAL LOW (ref >60)
Glucose, Bld: 191 mg/dL — ABNORMAL HIGH (ref 70–99)
Potassium: 4.8 mmol/L (ref 3.5–5.1)
Sodium: 139 mmol/L (ref 135–145)
Total Bilirubin: 0.3 mg/dL (ref 0.3–1.2)
Total Protein: 6.7 g/dL (ref 6.5–8.1)

## 2020-02-09 LAB — URINALYSIS, ROUTINE W REFLEX MICROSCOPIC
Bilirubin Urine: NEGATIVE
Glucose, UA: 150 mg/dL — AB
Hgb urine dipstick: NEGATIVE
Ketones, ur: NEGATIVE mg/dL
Leukocytes,Ua: NEGATIVE
Nitrite: NEGATIVE
Protein, ur: NEGATIVE mg/dL
Specific Gravity, Urine: 1.015 (ref 1.005–1.030)
pH: 5 (ref 5.0–8.0)

## 2020-02-09 LAB — MAGNESIUM: Magnesium: 2.1 mg/dL (ref 1.7–2.4)

## 2020-02-09 LAB — GLUCOSE, CAPILLARY
Glucose-Capillary: 105 mg/dL — ABNORMAL HIGH (ref 70–99)
Glucose-Capillary: 141 mg/dL — ABNORMAL HIGH (ref 70–99)
Glucose-Capillary: 173 mg/dL — ABNORMAL HIGH (ref 70–99)
Glucose-Capillary: 183 mg/dL — ABNORMAL HIGH (ref 70–99)
Glucose-Capillary: 193 mg/dL — ABNORMAL HIGH (ref 70–99)

## 2020-02-09 LAB — PHOSPHORUS: Phosphorus: 3.3 mg/dL (ref 2.5–4.6)

## 2020-02-09 LAB — HIV ANTIBODY (ROUTINE TESTING W REFLEX): HIV Screen 4th Generation wRfx: NONREACTIVE

## 2020-02-09 LAB — CBC
HCT: 36.4 % — ABNORMAL LOW (ref 39.0–52.0)
Hemoglobin: 11.6 g/dL — ABNORMAL LOW (ref 13.0–17.0)
MCH: 29.5 pg (ref 26.0–34.0)
MCHC: 31.9 g/dL (ref 30.0–36.0)
MCV: 92.6 fL (ref 80.0–100.0)
Platelets: 177 10*3/uL (ref 150–400)
RBC: 3.93 MIL/uL — ABNORMAL LOW (ref 4.22–5.81)
RDW: 13 % (ref 11.5–15.5)
WBC: 8.7 10*3/uL (ref 4.0–10.5)
nRBC: 0 % (ref 0.0–0.2)

## 2020-02-09 LAB — TROPONIN I (HIGH SENSITIVITY)
Troponin I (High Sensitivity): 5 ng/L (ref <18)
Troponin I (High Sensitivity): 6 ng/L (ref <18)

## 2020-02-09 LAB — TSH: TSH: 1.344 u[IU]/mL (ref 0.350–4.500)

## 2020-02-09 LAB — SARS CORONAVIRUS 2 (TAT 6-24 HRS): SARS Coronavirus 2: NEGATIVE

## 2020-02-09 LAB — PROTIME-INR
INR: 1.4 — ABNORMAL HIGH (ref 0.8–1.2)
Prothrombin Time: 17.4 seconds — ABNORMAL HIGH (ref 11.4–15.2)

## 2020-02-09 LAB — ECHOCARDIOGRAM COMPLETE
Height: 73 in
Weight: 3840 oz

## 2020-02-09 MED ORDER — AMLODIPINE BESYLATE 5 MG PO TABS: 5.0000 mg | ORAL_TABLET | Freq: Every day | ORAL | 0 refills | Status: DC

## 2020-02-09 MED ORDER — GABAPENTIN 300 MG PO CAPS
300.0000 mg | ORAL_CAPSULE | Freq: Three times a day (TID) | ORAL | Status: DC
  Administered 2020-02-09 (×3): 300 mg via ORAL
  Filled 2020-02-09 (×3): qty 1

## 2020-02-09 MED ORDER — WARFARIN SODIUM 10 MG PO TABS
10.0000 mg | ORAL_TABLET | Freq: Once | ORAL | Status: AC
  Administered 2020-02-09: 10 mg via ORAL
  Filled 2020-02-09: qty 1

## 2020-02-09 MED ORDER — ONDANSETRON HCL 4 MG/2ML IJ SOLN: 4.0000 mg | Freq: Four times a day (QID) | INTRAMUSCULAR | Status: DC | PRN

## 2020-02-09 MED ORDER — SODIUM CHLORIDE 0.9 % IV SOLN: INTRAVENOUS | Status: AC

## 2020-02-09 MED ORDER — ACETAMINOPHEN 650 MG RE SUPP: 650.0000 mg | Freq: Four times a day (QID) | RECTAL | Status: DC | PRN

## 2020-02-09 MED ORDER — ASPIRIN EC 81 MG PO TBEC
81.0000 mg | DELAYED_RELEASE_TABLET | Freq: Every day | ORAL | Status: DC
  Administered 2020-02-09: 81 mg via ORAL
  Filled 2020-02-09: qty 1

## 2020-02-09 MED ORDER — TRAMADOL HCL 50 MG PO TABS: 50.0000 mg | ORAL_TABLET | Freq: Four times a day (QID) | ORAL | Status: DC | PRN

## 2020-02-09 MED ORDER — CILOSTAZOL 100 MG PO TABS
100.0000 mg | ORAL_TABLET | Freq: Two times a day (BID) | ORAL | Status: DC
  Administered 2020-02-09 (×2): 100 mg via ORAL
  Filled 2020-02-09 (×4): qty 1

## 2020-02-09 MED ORDER — WARFARIN - PHARMACIST DOSING INPATIENT: Freq: Every day | Status: DC

## 2020-02-09 MED ORDER — ACETAMINOPHEN 325 MG PO TABS: 650.0000 mg | ORAL_TABLET | Freq: Four times a day (QID) | ORAL | Status: DC | PRN

## 2020-02-09 MED ORDER — WARFARIN SODIUM 10 MG PO TABS
10.0000 mg | ORAL_TABLET | ORAL | Status: AC
  Administered 2020-02-09: 10 mg via ORAL
  Filled 2020-02-09: qty 1

## 2020-02-09 MED ORDER — ONDANSETRON HCL 4 MG PO TABS: 4.0000 mg | ORAL_TABLET | Freq: Four times a day (QID) | ORAL | Status: DC | PRN

## 2020-02-09 MED ORDER — PRAVASTATIN SODIUM 40 MG PO TABS
40.0000 mg | ORAL_TABLET | Freq: Every day | ORAL | Status: DC
  Administered 2020-02-09: 40 mg via ORAL
  Filled 2020-02-09: qty 1

## 2020-02-09 MED ORDER — INSULIN ASPART 100 UNIT/ML ~~LOC~~ SOLN
0.0000 [IU] | SUBCUTANEOUS | Status: DC
  Administered 2020-02-09: 1 [IU] via SUBCUTANEOUS
  Administered 2020-02-09 (×3): 2 [IU] via SUBCUTANEOUS

## 2020-02-09 MED ORDER — HYDROCODONE-ACETAMINOPHEN 5-325 MG PO TABS: 1.0000 | ORAL_TABLET | ORAL | Status: DC | PRN

## 2020-02-09 MED ORDER — INSULIN GLARGINE 100 UNIT/ML ~~LOC~~ SOLN
10.0000 [IU] | Freq: Every day | SUBCUTANEOUS | Status: DC
  Administered 2020-02-09: 10 [IU] via SUBCUTANEOUS
  Filled 2020-02-09 (×2): qty 0.1

## 2020-02-09 NOTE — Consult Note (Addendum)
Cardiology Consultation:   Patient ID: [name] MRN: [id number]; DOB: [birth year]  Admit date: 02/08/2020 Date of Consult: 02/09/2020  Primary Care Provider: [name], MD Primary Cardiologist: New to [place] (Dr. Margaretann Loveless) Primary Electrophysiologist:  None    Patient Profile:   [name] is a 64 y.o. male with a history of PE, diabetes mellitus, CKD stage IV, arthritis, who is being seen today for the evaluation of syncope at the request of Dr. Roel Cluck.  History of Present Illness:   [name] is a 64 year old male with the above history. Patient recently seen at the [facility] ED on 01/18/2020 for worsening flank pain and worsening renal function. CT renal study showed bilateral non-obstructing nephrolithiasis, multiple stone in the dependent portion of the urinary bladder, and mild left-sided collecting system dilatation without evidence of an obstructing stone. PCP had been following worsening renal function closely and he had recently been taken off Spironolactone and started on Chlorthalidone due to hyperkalemia.  Creatinine was noted to be 2.91 and patient was referred to Nephrology. Diuretics and Metformin were stopped in the ED and patient was instructed to follow-up with PCP.   Patient presented to the ED yesterday via EMS for evaluation of syncope. Patient states he was doing light work on his car yesterday. Nothing strenuous but he was working out in the sun. After about 1 hour of this, he said his back and neck started to bother him so he went to sit down in the car for a while. He noted a general sense of unwellness and was sweating a little but attributed this to being out in the sun. He initially though his blood sugar was low so he had his wife go and grab him a candy bar which helped a little. He then reportedly passed out for about 1 minute. He denies any chest pain, shortness of breath, palpitations, lightheadedness, or dizziness prior to this and just  states he did not feel great. His wife called 61 and when EMS arrived systolic BP was in the 01'B. EMS gave him 1000 mL bolus of normal saline which did help him feel better. He has only had one other syncopal episode before and this was in the setting of a PE about 10 years ago while living in [place]. He does have chronic lower extremity edema which has been getting worse over the last couple of months as his kidney function has been worsening. No orthopnea or PND. No recent fevers, illnesses, body aches, or body chills. He notes occasional blood in his urine with his kidney stones but states flanks pain has improved. No blood in stools.   Upon arrival to the ED, vitals stable. CBG 194. EKG showed no acute ischemic changes. High-sensitivity troponin negative x4. Chest x-ray showed no acute findings. WBC 9.1, Hgb 11.5, Plts 189. Na 137, K 5.2, Glucose 214, BUN 41, Cr 2.37. Albumin 3.4, AST 14, ALT 18, Alk Phos 67, Total Bili 0.5. COVID-19 negative. Patient was admitted for further evaluation and Cardiology consulted.   At the time of this evaluation, patient resting comfortably in bed and states he is feeling better.  He has no history of tobacco use. He occasionally drinks alcohol (6 pack of beer will last him a month). No recreational drug use. He does have a family history of heart disease with his father being diagnosed with CHF around 83 years old and his maternal grandfather dying from a heart attack.   Past Medical History:  Diagnosis  Date  . Arthritis   . Diabetes mellitus without complication ([place])   . History of pulmonary embolus (PE)     No past surgical history on file.   Home Medications:  Prior to Admission medications   Medication Sig Start Date End Date Taking? Authorizing Provider  acetaminophen (TYLENOL 8 HOUR ARTHRITIS PAIN) 650 MG CR tablet Take 650 mg by mouth every 8 (eight) hours as needed for pain.   Yes [provider]  aspirin EC 81 MG tablet Take 81 mg  by mouth daily.   Yes [provider]  cilostazol (PLETAL) 100 MG tablet Take 100 mg by mouth 2 (two) times daily.   Yes [provider]  gabapentin (NEURONTIN) 300 MG capsule Take 300 mg by mouth 3 (three) times daily.   Yes [provider]  HUMULIN 70/30 (70-30) 100 UNIT/ML injection INJECT 50 UNITS TWICE A DAY 01/31/20  Yes [name], MD  lisinopril (ZESTRIL) 20 MG tablet Take 1 tablet (20 mg total) by mouth daily. 01/20/20  Yes [name], MD  lovastatin (MEVACOR) 40 MG tablet Take 40 mg by mouth at bedtime.   Yes [provider]  traMADol (ULTRAM) 50 MG tablet Take 50 mg by mouth every 6 (six) hours as needed for moderate pain.    Yes [provider]  warfarin (COUMADIN) 2 MG tablet Take 2 mg by mouth See admin instructions. Take 32m tablet along with the 622mtablet daily for total of 53m69mxcept on Wednesday and fridays take 5mg61mblet per patient.   Yes [provider]  warfarin (COUMADIN) 5 MG tablet Take 1 tablet (5 mg total) by mouth daily. Take as directed. Patient taking differently: Take 5 mg by mouth daily. Take 5mg 53mlet by mouth on Wednesdays and Fridays, then take a 53mg t70met by mouth rest of days per patient 12/22/19  Yes KremerLibby Mawwarfarin (COUMADIN) 6 MG tablet Take 6 mg by mouth See admin instructions. Take 6mg ta76mt along with the 2mg tab5m daily for total of 53mg exce153mon Wednesday and fridays take 5mg table97mer patient.   Yes [provider]  chlorthalidone (HYGROTON) 25 MG tablet Take 1 tablet (25 mg total) by mouth daily. Patient not taking: Reported on 01/26/2020 01/17/20   Kremer, WiLibby MawiZIDE (GLUCOTROL) 5 MG tablet Take by mouth daily before breakfast.    [provider]  metFORMIN (GLUMETZA) 500 MG (MOD) 24 hr tablet Take 500 mg by mouth daily with breakfast.    [provider]    Inpatient Medications: Scheduled Meds: . aspirin  EC  81 mg Oral Daily  . cilostazol  100 mg Oral BID  . gabapentin  300 mg Oral TID  . insulin aspart  0-9 Units Subcutaneous Q4H  . insulin glargine  10 Units Subcutaneous QHS  . pravastatin  40 mg Oral q1800  . warfarin  10 mg Oral ONCE-1600  . Warfarin - Pharmacist Dosing Inpatient   Does not apply q1600   Continuous Infusions: . sodium chloride 75 mL/hr at 02/09/20 0048   PRN Meds: acetaminophen **OR** acetaminophen, HYDROcodone-acetaminophen, ondansetron **OR** ondansetron (ZOFRAN) IV, traMADol  Allergies:   No Known Allergies  Social History:   Social History   Socioeconomic History  . Marital status: Legally Separated    Spouse name: Not on file  . Number of children: Not on file  . Years of education: Not on file  . Highest education level: Not on file  Occupational History  . Not on file  Tobacco Use  . Smoking status: Never Smoker  . Smokeless tobacco: Never Used  Substance and Sexual Activity  . Alcohol use: Yes    Alcohol/week: 1.0 standard drinks    Types: 1 Cans of beer per week    Comment: social  . Drug use: Never  . Sexual activity: Not on file  Other Topics Concern  . Not on file  Social History Narrative  . Not on file   Social Determinants of Health   Financial Resource Strain:   . Difficulty of Paying Living Expenses:   Food Insecurity:   . Worried About Charity fundraiser in the Last Year:   . Arboriculturist in the Last Year:   Transportation Needs:   . Film/video editor (Medical):   [name] Lack of Transportation (Non-Medical):   Physical Activity:   . Days of Exercise per Week:   . Minutes of Exercise per Session:   Stress:   . Feeling of Stress :   Social Connections:   . Frequency of Communication with Friends and Family:   . Frequency of Social Gatherings with Friends and Family:   . Attends Religious Services:   . Active Member of Clubs or Organizations:   . Attends Archivist Meetings:   [name] Marital Status:     Intimate Partner Violence:   . Fear of Current or Ex-Partner:   . Emotionally Abused:   [name] Physically Abused:   . Sexually Abused:     Family History:    Family History  Problem Relation Age of Onset  . Cancer Mother   . Heart disease Father      ROS:  Please see the history of present illness.  Review of Systems  Constitutional: Negative for chills and fever.  HENT: Negative for congestion.   Respiratory: Negative for hemoptysis and shortness of breath.   Cardiovascular: Positive for leg swelling. Negative for chest pain, palpitations, orthopnea and PND.  Gastrointestinal: Negative for abdominal pain, blood in stool, melena, nausea and vomiting.  Genitourinary: Positive for flank pain (improved) and hematuria (with kidney stones).  Musculoskeletal: Positive for back pain. Negative for myalgias.  Neurological: Positive for loss of consciousness. Negative for dizziness.  Endo/Heme/Allergies: Does not bruise/bleed easily.  Psychiatric/Behavioral: Negative for substance abuse.   Physical Exam/Data:   Vitals:   02/08/20 2336 02/08/20 2345 02/09/20 0023 02/09/20 0506  BP: 137/78 135/75 (!) 143/76 134/85  Pulse: 83  89 82  Resp: 12 13 16 18   Temp:   97.8 F (36.6 C) 97.7 F (36.5 C)  TempSrc:   Oral Oral  SpO2: 100%  100% 99%  Weight:      Height:        Intake/Output Summary (Last 24 hours) at 02/09/2020 0946 Last data filed at 02/08/2020 1952 Gross per 24 hour  Intake 1000 ml  Output --  Net 1000 ml   Last 3 Weights 02/08/2020 01/26/2020 01/17/2020  Weight (lbs) 240 lb 244 lb 6.4 oz 244 lb  Weight (kg) 108.863 kg 110.859 kg 110.678 kg     Body mass index is 31.66 kg/m.  General: 64 y.o. male resting comfortably in no acute distress. HEENT: Normocephalic and atraumatic.  Neck: Supple. No carotid bruits. No JVD. Heart: RRR. Distinct S1 and S2. No significant murmurs, gallops, or rubs. Radial and distal pedal pulses 2+ and equal bilaterally. Lungs: No increased work of  breathing. Clear to ausculation bilaterally. No wheezes, rhonchi,  or rales.  Abdomen: Soft, non-distended, and non-tender to palpation. Bowel sounds present. Extremities: 1+ pitting edema of bilateral lower extremities (right > left) with some hyperpigmentation mostly on right lower extremity consistent with chronic venous stasis.  Skin: Warm and dry. Neuro: Alert and oriented x3. No focal deficits. Psych: Normal affect. Responds appropriately.  EKG:  The EKG was personally reviewed and demonstrates: Normal sinus rhythm, rate 78 bpm, with LVH, mild T wave inversion in lead III, and T wave flattening in lead aVF.  Telemetry:  Telemetry was personally reviewed and demonstrates:  Normal sinus rhythm with rates mostly in the 70's to 90's (brief episodes in the 110's).   Relevant CV Studies:  Echocardiogram 02/09/2020: Impressions: 1. Left ventricular ejection fraction, by estimation, is 60 to 65%. The  left ventricle has normal function. The left ventricle has no regional  wall motion abnormalities. There is mild left ventricular hypertrophy.  Left ventricular diastolic parameters  are consistent with Grade I diastolic dysfunction (impaired relaxation).  2. Right ventricular systolic function is normal. The right ventricular  size is normal.  3. Left atrial size was mildly dilated.  4. The mitral valve is grossly normal. Trivial mitral valve  regurgitation.  5. The aortic valve is tricuspid. Aortic valve regurgitation is not  visualized.  6. The inferior vena cava is normal in size with <50% respiratory  variability, suggesting right atrial pressure of 8 mmHg.   Laboratory Data:  High Sensitivity Troponin:   Recent Labs  Lab 02/08/20 1849 02/08/20 2130 02/08/20 2300 02/09/20 0107  TROPONINIHS 5 6 5 6      Chemistry Recent Labs  Lab 02/08/20 1849 02/09/20 0107  NA 137 139  K 5.2* 4.8  CL 110 111  CO2 18* 19*  GLUCOSE 214* 191*  BUN 41* 36*  CREATININE 2.37* 2.10*    CALCIUM 8.6* 8.5*  GFRNONAA 28* 32*  GFRAA 32* 37*  ANIONGAP 9 9    Recent Labs  Lab 02/08/20 1849 02/09/20 0107  PROT 6.7 6.7  ALBUMIN 3.4* 3.6  AST 14* 12*  ALT 18 18  ALKPHOS 67 70  BILITOT 0.5 0.3   Hematology Recent Labs  Lab 02/08/20 1849 02/09/20 0107  WBC 9.1 8.7  RBC 3.81* 3.93*  HGB 11.5* 11.6*  HCT 35.4* 36.4*  MCV 92.9 92.6  MCH 30.2 29.5  MCHC 32.5 31.9  RDW 13.0 13.0  PLT 189 177   BNP Recent Labs  Lab 02/08/20 1849  BNP 11.3    DDimer No results for input(s): DDIMER in the last 168 hours.   Radiology/Studies:  DG Chest 2 View  Result Date: 02/08/2020 CLINICAL DATA:  Syncope today. EXAM: CHEST - 2 VIEW COMPARISON:  None. FINDINGS: The heart size and mediastinal contours are within normal limits. Both lungs are clear. The visualized skeletal structures are unremarkable. IMPRESSION: Normal exam. Electronically Signed   By: Lorriane Shire M.D.   On: 02/08/2020 19:23   CT RENAL STONE STUDY  Result Date: 02/08/2020 CLINICAL DATA:  Renal failure.  Left abdominal pain. EXAM: CT ABDOMEN AND PELVIS WITHOUT CONTRAST TECHNIQUE: Multidetector CT imaging of the abdomen and pelvis was performed following the standard protocol without IV contrast. COMPARISON:  01/18/2020 FINDINGS: Lower chest: Lung bases are clear. No effusions. Heart is normal size. Hepatobiliary: No focal hepatic abnormality. Gallbladder unremarkable. Pancreas: No focal abnormality or ductal dilatation. Spleen: No focal abnormality.  Normal size. Adrenals/Urinary Tract: Small bilateral nonobstructing renal stones, the largest in the right lower pole measuring 4-5 mm. No  ureteral stones or hydronephrosis. Two small stones layering dependently in the urinary bladder, the largest 3 mm. Adrenal glands unremarkable. Low-density lesions within the left kidney, likely cysts, the largest 2.6 cm. Stomach/Bowel: Normal appendix. Stomach, large and small bowel grossly unremarkable. Vascular/Lymphatic: No evidence  of aneurysm or adenopathy. Reproductive: Prostate enlargement Other: No free fluid or free air. Musculoskeletal: No acute bony abnormality. IMPRESSION: Bilateral nephrolithiasis. Small stones layering dependently in the urinary bladder. No hydronephrosis. Prostate enlargement. No acute findings in the abdomen or pelvis. Electronically Signed   By: Rolm Baptise M.D.   On: 02/08/2020 22:07    Assessment and Plan:   Syncope - Likely due to hypotension and possible orthostasis.  - No concerning arrhythmias on telemetry. - Echo showed LVEF of 60-65% with normal wall motion and grade 1 diastolic dysfunction. No significant valvular disease.  - Carotid dopplers pending.  - BP improved with fluids and patient feels better.  - Can check orthostatic vital signs.  - Suspect this was all due to hypotension but will discuss with MD about whether outpatient monitor is needed.  History of PE - History of PE about 10 years ago while living in [place]. He states this was due to DVT behind knee.  - Maintained on Coumadin. INR subtherapeutic at 1.4 yesterday and today. Dosing per Pharmacy.   Hypertension  - Patient initially hypotensive but BP has since improved and is now mildly elevated. Patient states BP normally in the 130's/80's at home.  - Previously on Chlorthalidone at home but this has been held due to AKI.  - NO ACEi/ARB given AKI. Could consider low dose beta-blocker (Coreg) or Hydralazine if BP remains elevated and medication is needed.   Type 2 Diabetes Mellitus - Management per primary team.   Chronic Lower Extremity Edema - Patient has chronic lower extremity edema and sounds like he has chronic venous status.  - He states he is on Pletal to help with circulation in his extremities.  - Mild edema noted bilaterally (right > left). Lower extremity dopplers have been ordered to rule out DVT given subtherapeutic INR.  - If negative for DVT, recommend compression stockings.   Acute on  Chronic CKD - Creatinine 2.37 on admission. Patient has reportedly been having worsening renal function late. Creatinine as high as 3.34 on 01/17/2020.  - CT renal study showed bilateral nephrolithiasis and small stones layering dependently in the urinary bladder. No hydronephrosis and no acute findings in the abdomen or pelvis.  - Avoid Nephrotoxic agents. - Management per primary team.    For questions or updates, please contact [place] Please consult [email or web address] for contact info under     Signed, Darreld Mclean, PA-C  02/09/2020 9:46 AM  Patient seen and examined with [name] PAC.  Agree as above, with the following exceptions and changes as noted below. He is feeling better after IV fluids. Unable to examine heart as patient was ambulating in the room and using the bathroom. Gen: NAD, Lungs: no increased work of breathing. Extrem: mild chronic edema. Psych: normal mood and affect. All available labs, radiology testing, previous records reviewed. BP meds on hold due to hypotension and AKI. We will see him in close outpatient follow up and continue to assess.   Elouise Munroe, MD

## 2020-02-09 NOTE — Discharge Summary (Signed)
Physician Discharge Summary  [name] NKN:[id number] DOB: Aug 01, 1956 DOA: 02/08/2020  PCP: [name], MD  Admit date: 02/08/2020 Discharge date: 02/09/2020  Admitted From: Home Disposition: Home  Recommendations for Outpatient Follow-up:  1. Follow up with PCP in 2 days as a scheduled. 2. Please obtain BMP and INR in 2 days.  Discharge Condition: Stable CODE STATUS: Full code Diet recommendation: Low-salt diet, low carb diet  Discharge summary: 64 year old gentleman with history of pulmonary embolism on Coumadin, diabetes mellitus on insulin, stage IV chronic kidney disease with baseline creatinine about 1.7, recent kidney stone and emergency room visits with AKI presented to the emergency room by EMS for evaluation of syncope.  Patient also has history of bilateral knee osteoarthritis and has pretty significant pain and limitation.  Patient was working light to work at home all day yesterday.  His neck and back he started to bother him, he went to sit down in the car for a while, he felt sweaty little bit and just did not feel well.  He passed out for about a minute.  His wife called EMS, blood pressure was 80 systolic on EMS arrival.  He had recently stopped taking chlorthalidone and Metformin with worsening kidney functions.  On arrival to the emergency room, blood sugars were stable.  Twelve-lead EKG showed no ischemia.  Clinically dehydrated, creatinine 2.37.  COVID-19 negative.  Admitted with IV fluid administration and telemetry monitor.  Uneventful.  Currently no orthostatic changes.  Blood pressures adequate.  2D echocardiogram is essentially normal.  Bilateral carotid duplexes show 1 to 39% stenosis, not significant.  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  His INR is 1.4.  Plan: Adequately improved now.  Probable orthostatic hypotension, dehydration as evidenced by elevated creatinine that improved with hydration. We will change his  lisinopril to amlodipine 5 mg daily, to start tomorrow. 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  There is no need to bridge for chronic DVT. He has significant knee pain, left more than right, he may be a good candidate to consider for total knee replacement, patient will talk to his primary care physician. Cardiology following, less likely arrhythmias, will follow recommendations.  Discharge Diagnoses:  Active Problems:   Type 2 diabetes mellitus with hyperglycemia, with long-term current use of insulin (HCC)   History of pulmonary embolism   Essential hypertension   CKD (chronic kidney disease) stage 4, GFR 15-29 ml/min (HCC)   Renal lithiasis   Syncope    Discharge Instructions  Discharge Instructions    Diet - low sodium heart healthy   Complete by: As directed    Discharge instructions   Complete by: As directed    Drink plenty of water Take coumadin  8 mg tonight  Follow up BMP and INR on 4/9 at PCP office   Increase activity slowly   Complete by: As directed      Allergies as of 02/09/2020   No Known Allergies     Medication List    STOP taking these medications   chlorthalidone 25 MG tablet Commonly known as: HYGROTON   glipiZIDE 5 MG tablet Commonly known as: GLUCOTROL   lisinopril 20 MG tablet Commonly known as: ZESTRIL   metFORMIN 500 MG (MOD) 24 hr tablet Commonly known as: GLUMETZA     TAKE these medications   amLODipine 5 MG tablet Commonly known as: NORVASC Take 1 tablet (  5 mg total) by mouth daily.   aspirin EC 81 MG tablet Take 81 mg by mouth daily.   cilostazol 100 MG tablet Commonly known as: PLETAL Take 100 mg by mouth 2 (two) times daily.   gabapentin 300 MG capsule Commonly known as: NEURONTIN Take 300 mg by mouth 3 (three) times daily.   HumuLIN 70/30 (70-30) 100 UNIT/ML injection Generic drug: insulin NPH-regular Human INJECT 50  UNITS TWICE A DAY   lovastatin 40 MG tablet Commonly known as: MEVACOR Take 40 mg by mouth at bedtime.   traMADol 50 MG tablet Commonly known as: ULTRAM Take 50 mg by mouth every 6 (six) hours as needed for moderate pain.   Tylenol 8 Hour Arthritis Pain 650 MG CR tablet Generic drug: acetaminophen Take 650 mg by mouth every 8 (eight) hours as needed for pain.   warfarin 6 MG tablet Commonly known as: COUMADIN Take as directed. If you are unsure how to take this medication, talk to your nurse or doctor. Original instructions: Take 6 mg by mouth See admin instructions. Take 6mg  tablet along with the 2mg  tablet daily for total of 8mg  except on Wednesday and fridays take 5mg  tablet per patient. What changed: Another medication with the same name was changed. Make sure you understand how and when to take each.   warfarin 2 MG tablet Commonly known as: COUMADIN Take as directed. If you are unsure how to take this medication, talk to your nurse or doctor. Original instructions: Take 2 mg by mouth See admin instructions. Take 2mg  tablet along with the 6mg  tablet daily for total of 8mg  except on Wednesday and fridays take 5mg  tablet per patient. What changed: Another medication with the same name was changed. Make sure you understand how and when to take each.   warfarin 5 MG tablet Commonly known as: COUMADIN Take as directed. If you are unsure how to take this medication, talk to your nurse or doctor. Original instructions: Take 1 tablet (5 mg total) by mouth daily. Take as directed. What changed: additional instructions       No Known Allergies  Consultations:  Cardiology   Procedures/Studies: DG Chest 2 View  Result Date: 02/08/2020 CLINICAL DATA:  Syncope today. EXAM: CHEST - 2 VIEW COMPARISON:  None. FINDINGS: The heart size and mediastinal contours are within normal limits. Both lungs are clear. The visualized skeletal structures are unremarkable. IMPRESSION: Normal exam.  Electronically Signed   By: Lorriane Shire M.D.   On: 02/08/2020 19:23   VAS [place] ABI WITH/WO TBI  Result Date: 02/03/2020 LOWER EXTREMITY DOPPLER STUDY Indications: Claudication. High Risk Factors: Hypertension, Diabetes, no history of smoking. Other Factors: Diminished pulses left foot.  Performing Technologist: Delorise Shiner RVT  Examination Guidelines: A complete evaluation includes at minimum, Doppler waveform signals and systolic blood pressure reading at the level of bilateral brachial, anterior tibial, and posterior tibial arteries, when vessel segments are accessible. Bilateral testing is considered an integral part of a complete examination. Photoelectric Plethysmograph (PPG) waveforms and toe systolic pressure readings are included as required and additional duplex testing as needed. Limited examinations for reoccurring indications may be performed as noted.  ABI Findings: +---------+------------------+-----+---------+--------+ Right    Rt Pressure (mmHg)IndexWaveform Comment  +---------+------------------+-----+---------+--------+ Brachial 176                                      +---------+------------------+-----+---------+--------+ ATA  238               1.35                   +---------+------------------+-----+---------+--------+ PTA      128               0.73 triphasic         +---------+------------------+-----+---------+--------+ DP                              triphasic         +---------+------------------+-----+---------+--------+ Great Toe167               0.95                   +---------+------------------+-----+---------+--------+ +---------+------------------+-----+---------+-------+ Left     Lt Pressure (mmHg)IndexWaveform Comment +---------+------------------+-----+---------+-------+ Brachial 173                                     +---------+------------------+-----+---------+-------+ ATA      217               1.23                   +---------+------------------+-----+---------+-------+ PTA      175               0.99 triphasic        +---------+------------------+-----+---------+-------+ DP                              triphasic        +---------+------------------+-----+---------+-------+ Great Toe116               0.66                  +---------+------------------+-----+---------+-------+ +-------+-----------+-----------+------------+------------+ ABI/TBIToday's ABIToday's TBIPrevious ABIPrevious TBI +-------+-----------+-----------+------------+------------+ Right  1.35       0.95                                +-------+-----------+-----------+------------+------------+ Left   1.23       0.66                                +-------+-----------+-----------+------------+------------+ Arterial wall calcification precludes accurate ankle pressures and ABIs.  Summary: Right: Resting right ankle-brachial index indicates noncompressible right lower extremity arteries. The right toe-brachial index is normal. RT great toe pressure = 167 mmHg. Left: Resting left ankle-brachial index is within normal range. No evidence of significant left lower extremity arterial disease. The left toe-brachial index is abnormal. LT Great toe pressure = 116 mmHg.  *See table(s) above for measurements and observations.  Electronically signed by Ruta Hinds MD on 02/03/2020 at 5:17:45 PM.    Final    ECHOCARDIOGRAM COMPLETE  Result Date: 02/09/2020    ECHOCARDIOGRAM REPORT   Patient Name:   [name] Date of Exam: 02/09/2020 Medical Rec #:  [id number]   Height:       73.0 in Accession #:    [id number]  Weight:       240.0 lb Date of Birth:  29-Sep-1956   BSA:          2.325 m Patient Age:  63 years    BP:           134/85 mmHg Patient Gender: M           HR:           83 bpm. Exam Location:  Inpatient Procedure: 2D Echo, Color Doppler and Cardiac Doppler Indications:    R55 Syncope  History:        Patient has no prior  history of Echocardiogram examinations.                 Risk Factors:Hypertension and Diabetes. History of Pulmonary                 Embolus.  Sonographer:    Raquel Sarna Senior RDCS Referring Phys: [place]  1. Left ventricular ejection fraction, by estimation, is 60 to 65%. The left ventricle has normal function. The left ventricle has no regional wall motion abnormalities. There is mild left ventricular hypertrophy. Left ventricular diastolic parameters are consistent with Grade I diastolic dysfunction (impaired relaxation).  2. Right ventricular systolic function is normal. The right ventricular size is normal.  3. Left atrial size was mildly dilated.  4. The mitral valve is grossly normal. Trivial mitral valve regurgitation.  5. The aortic valve is tricuspid. Aortic valve regurgitation is not visualized.  6. The inferior vena cava is normal in size with <50% respiratory variability, suggesting right atrial pressure of 8 mmHg. FINDINGS  Left Ventricle: Left ventricular ejection fraction, by estimation, is 60 to 65%. The left ventricle has normal function. The left ventricle has no regional wall motion abnormalities. The left ventricular internal cavity size was normal in size. There is  mild left ventricular hypertrophy. Left ventricular diastolic parameters are consistent with Grade I diastolic dysfunction (impaired relaxation). Normal left ventricular filling pressure. Right Ventricle: The right ventricular size is normal. No increase in right ventricular wall thickness. Right ventricular systolic function is normal. Left Atrium: Left atrial size was mildly dilated. Right Atrium: Right atrial size was normal in size. Pericardium: There is no evidence of pericardial effusion. Mitral Valve: The mitral valve is grossly normal. Trivial mitral valve regurgitation. Tricuspid Valve: The tricuspid valve is grossly normal. Tricuspid valve regurgitation is trivial. Aortic Valve: The aortic valve is  tricuspid. Aortic valve regurgitation is not visualized. Pulmonic Valve: The pulmonic valve was normal in structure. Pulmonic valve regurgitation is not visualized. Aorta: The aortic root and ascending aorta are structurally normal, with no evidence of dilitation. Venous: The inferior vena cava is normal in size with less than 50% respiratory variability, suggesting right atrial pressure of 8 mmHg. IAS/Shunts: No atrial level shunt detected by color flow Doppler.  LEFT VENTRICLE PLAX 2D LVIDd:         4.50 cm  Diastology LVIDs:         3.00 cm  LV e' lateral:   12.00 cm/s LV PW:         1.10 cm  LV E/e' lateral: 5.4 LV IVS:        1.20 cm  LV e' medial:    7.94 cm/s LVOT diam:     2.20 cm  LV E/e' medial:  8.2 LV SV:         77 LV SV Index:   33 LVOT Area:     3.80 cm  RIGHT VENTRICLE RV S prime:     14.40 cm/s TAPSE (M-mode): 2.6 cm LEFT ATRIUM  Index       RIGHT ATRIUM           Index LA diam:        3.80 cm 1.63 cm/m  RA Area:     15.90 cm LA Vol (A2C):   58.3 ml 25.08 ml/m RA Volume:   36.30 ml  15.61 ml/m LA Vol (A4C):   85.0 ml 36.56 ml/m LA Biplane Vol: 77.8 ml 33.46 ml/m  AORTIC VALVE LVOT Vmax:   84.20 cm/s LVOT Vmean:  60.100 cm/s LVOT VTI:    0.203 m  AORTA Ao Root diam: 3.20 cm MITRAL VALVE MV Area (PHT): 2.83 cm    SHUNTS MV Decel Time: 268 msec    Systemic VTI:  0.20 m MV E velocity: 65.10 cm/s  Systemic Diam: 2.20 cm MV A velocity: 77.10 cm/s MV E/A ratio:  0.84 Lyman Bishop MD Electronically signed by Lyman Bishop MD Signature Date/Time: 02/09/2020/12:32:48 PM    Final    CT RENAL STONE STUDY  Result Date: 02/08/2020 CLINICAL DATA:  Renal failure.  Left abdominal pain. EXAM: CT ABDOMEN AND PELVIS WITHOUT CONTRAST TECHNIQUE: Multidetector CT imaging of the abdomen and pelvis was performed following the standard protocol without IV contrast. COMPARISON:  01/18/2020 FINDINGS: Lower chest: Lung bases are clear. No effusions. Heart is normal size. Hepatobiliary: No focal hepatic  abnormality. Gallbladder unremarkable. Pancreas: No focal abnormality or ductal dilatation. Spleen: No focal abnormality.  Normal size. Adrenals/Urinary Tract: Small bilateral nonobstructing renal stones, the largest in the right lower pole measuring 4-5 mm. No ureteral stones or hydronephrosis. Two small stones layering dependently in the urinary bladder, the largest 3 mm. Adrenal glands unremarkable. Low-density lesions within the left kidney, likely cysts, the largest 2.6 cm. Stomach/Bowel: Normal appendix. Stomach, large and small bowel grossly unremarkable. Vascular/Lymphatic: No evidence of aneurysm or adenopathy. Reproductive: Prostate enlargement Other: No free fluid or free air. Musculoskeletal: No acute bony abnormality. IMPRESSION: Bilateral nephrolithiasis. Small stones layering dependently in the urinary bladder. No hydronephrosis. Prostate enlargement. No acute findings in the abdomen or pelvis. Electronically Signed   By: Rolm Baptise M.D.   On: 02/08/2020 22:07   CT Renal Stone Study  Result Date: 01/18/2020 CLINICAL DATA:  Flank pain. EXAM: CT ABDOMEN AND PELVIS WITHOUT CONTRAST TECHNIQUE: Multidetector CT imaging of the abdomen and pelvis was performed following the standard protocol without IV contrast. COMPARISON:  None. FINDINGS: Lower chest: The lung bases are clear. The heart size is normal. Hepatobiliary: The liver is normal. Normal gallbladder.There is no biliary ductal dilation. Pancreas: Normal contours without ductal dilatation. No peripancreatic fluid collection. Spleen: No splenic laceration or hematoma. Adrenals/Urinary Tract: --Adrenal glands: No adrenal hemorrhage. --Right kidney/ureter: Multiple nonobstructing stones are noted in the right kidney. --Left kidney/ureter: There are multiple nonobstructing stones in the left kidney. There is mild left-sided collecting system dilatation without evidence for an obstructing stone. Multiple simple cysts are noted. --Urinary bladder:  Multiple stones are noted in the dependent portion of the urinary bladder. Stomach/Bowel: --Stomach/Duodenum: No hiatal hernia or other gastric abnormality. Normal duodenal course and caliber. --Small bowel: No dilatation or inflammation. --Colon: There is a large amount of stool in the colon. --Appendix: Normal. Vascular/Lymphatic: Atherosclerotic calcification is present within the non-aneurysmal abdominal aorta, without hemodynamically significant stenosis. --No retroperitoneal lymphadenopathy. --No mesenteric lymphadenopathy. --No pelvic or inguinal lymphadenopathy. Reproductive: The prostate gland is enlarged. Other: No ascites or free air. There are bilateral fat containing inguinal hernias. Musculoskeletal. No acute displaced fractures. IMPRESSION: 1. Multiple stones are  noted in the dependent portion of the urinary bladder. There is mild left-sided collecting system dilatation without evidence of an obstructing stone. Findings may represent sequela of a recently passed left-sided stone. 2. Bilateral nonobstructing nephrolithiasis. 3. Large amount of stool in the colon. Correlate for constipation. 4. Aortic Atherosclerosis (ICD10-I70.0). Electronically Signed   By: Constance Holster M.D.   On: 01/18/2020 18:20   VAS US CAROTID  Result Date: 02/09/2020 Carotid Arterial Duplex Study Indications:       Syncope. Risk Factors:      None. Comparison Study:  No prior studies. Performing Technologist: Oliver Hum RVT  Examination Guidelines: A complete evaluation includes B-mode imaging, spectral Doppler, color Doppler, and power Doppler as needed of all accessible portions of each vessel. Bilateral testing is considered an integral part of a complete examination. Limited examinations for reoccurring indications may be performed as noted.  Right Carotid Findings: +----------+--------+--------+--------+-----------------------+--------+           PSV cm/sEDV cm/sStenosisPlaque Description     Comments  +----------+--------+--------+--------+-----------------------+--------+ CCA Prox  81      21              smooth and heterogenous         +----------+--------+--------+--------+-----------------------+--------+ CCA Distal64      25              smooth and heterogenous         +----------+--------+--------+--------+-----------------------+--------+ ICA Prox  68      20              smooth and heterogenous         +----------+--------+--------+--------+-----------------------+--------+ ICA Distal42      19                                     tortuous +----------+--------+--------+--------+-----------------------+--------+ ECA       82      14                                              +----------+--------+--------+--------+-----------------------+--------+ +----------+--------+-------+--------+-------------------+           PSV cm/sEDV cmsDescribeArm Pressure (mmHG) +----------+--------+-------+--------+-------------------+ [id number]                                        +----------+--------+-------+--------+-------------------+ +---------+--------+--+--------+-+---------+ VertebralPSV cm/s35EDV cm/s9Antegrade +---------+--------+--+--------+-+---------+  Left Carotid Findings: +----------+--------+--------+--------+------------------+--------+           PSV cm/sEDV cm/sStenosisPlaque DescriptionComments +----------+--------+--------+--------+------------------+--------+ CCA Prox  93      20                                         +----------+--------+--------+--------+------------------+--------+ CCA Distal63      22                                         +----------+--------+--------+--------+------------------+--------+ ICA Prox  66      14                                         +----------+--------+--------+--------+------------------+--------+  ICA Distal54      20                                tortuous  +----------+--------+--------+--------+------------------+--------+ ECA       69      9                                          +----------+--------+--------+--------+------------------+--------+ +----------+--------+--------+--------+-------------------+           PSV cm/sEDV cm/sDescribeArm Pressure (mmHG) +----------+--------+--------+--------+-------------------+ [id number]                                         +----------+--------+--------+--------+-------------------+ +---------+--------+--+--------+--+---------+ VertebralPSV cm/s48EDV cm/s13Antegrade +---------+--------+--+--------+--+---------+   Summary: Right Carotid: Velocities in the right ICA are consistent with a 1-39% stenosis. Left Carotid: Velocities in the left ICA are consistent with a 1-39% stenosis. Vertebrals: Bilateral vertebral arteries demonstrate antegrade flow. *See table(s) above for measurements and observations.     Preliminary    VAS [place] LOWER EXTREMITY VENOUS (DVT)  Result Date: 02/09/2020  Lower Venous DVTStudy Indications: Edema.  Risk Factors: None identified. Limitations: Body habitus and poor ultrasound/tissue interface. Comparison Study: No prior studies. Performing Technologist: Oliver Hum RVT  Examination Guidelines: A complete evaluation includes B-mode imaging, spectral Doppler, color Doppler, and power Doppler as needed of all accessible portions of each vessel. Bilateral testing is considered an integral part of a complete examination. Limited examinations for reoccurring indications may be performed as noted. The reflux portion of the exam is performed with the patient in reverse Trendelenburg.  +-----+---------------+---------+-----------+----------+--------------+ RIGHTCompressibilityPhasicitySpontaneityPropertiesThrombus Aging +-----+---------------+---------+-----------+----------+--------------+ CFV  Full           Yes      Yes                                  +-----+---------------+---------+-----------+----------+--------------+   +---------+---------------+---------+-----------+----------+-----------------+ LEFT     CompressibilityPhasicitySpontaneityPropertiesThrombus Aging    +---------+---------------+---------+-----------+----------+-----------------+ CFV      Full           Yes      Yes                                    +---------+---------------+---------+-----------+----------+-----------------+ SFJ      Full                                                           +---------+---------------+---------+-----------+----------+-----------------+ FV Prox  Partial        No       No                   Age Indeterminate +---------+---------------+---------+-----------+----------+-----------------+ FV Mid   Partial        No       No                   Age Indeterminate +---------+---------------+---------+-----------+----------+-----------------+ FV DistalPartial  No       No                   Age Indeterminate +---------+---------------+---------+-----------+----------+-----------------+ PFV      Full                                                           +---------+---------------+---------+-----------+----------+-----------------+ POP      Partial        No       No                   Age Indeterminate +---------+---------------+---------+-----------+----------+-----------------+ PTV      Full                                                           +---------+---------------+---------+-----------+----------+-----------------+ PERO     Partial                                      Age Indeterminate +---------+---------------+---------+-----------+----------+-----------------+     Summary: RIGHT: - No evidence of common femoral vein obstruction.  LEFT: - Findings consistent with age indeterminate deep vein thrombosis involving the left femoral vein, left popliteal vein, and left peroneal  veins. - A cystic structure is found in the popliteal fossa.  *See table(s) above for measurements and observations.    Preliminary       Subjective: Patient seen and examined.  Reevaluated in the evening for discharge readiness.  Denies any complaints.  His main problems are knee pain because of advanced osteoarthritis.  He has been ambulating around.  Blood pressures 149/84.  Was eating supper.   Discharge Exam: Vitals:   02/09/20 0949 02/09/20 1241  BP: (!) 149/84 (!) 144/75  Pulse: 82 97  Resp: 18 16  Temp: 97.8 F (36.6 C) 97.8 F (36.6 C)  SpO2: 100% 100%   Vitals:   02/09/20 0023 02/09/20 0506 02/09/20 0949 02/09/20 1241  BP: (!) 143/76 134/85 (!) 149/84 (!) 144/75  Pulse: 89 82 82 97  Resp: 16 18 18 16   Temp: 97.8 F (36.6 C) 97.7 F (36.5 C) 97.8 F (36.6 C) 97.8 F (36.6 C)  TempSrc: Oral Oral Oral Oral  SpO2: 100% 99% 100% 100%  Weight:      Height:        General: Pt is alert, awake, not in acute distress Cardiovascular: RRR, S1/S2 +, no rubs, no gallops Respiratory: CTA bilaterally, no wheezing, no rhonchi Abdominal: Soft, NT, ND, bowel sounds + Extremities: 1+ bilateral pedal edema.  No cyanosis.    The results of significant diagnostics from this hospitalization (including imaging, microbiology, ancillary and laboratory) are listed below for reference.     Microbiology: Recent Results (from the past 240 hour(s))  SARS CORONAVIRUS 2 (TAT 6-24 HRS) Nasopharyngeal Nasopharyngeal Swab     Status: None   Collection Time: 02/08/20  7:56 PM   Specimen: Nasopharyngeal Swab  Result Value Ref Range Status   SARS Coronavirus 2 NEGATIVE NEGATIVE Final    Comment: (NOTE) SARS-CoV-2 target nucleic  acids are NOT DETECTED. The SARS-CoV-2 RNA is generally detectable in upper and lower respiratory specimens during the acute phase of infection. Negative results do not preclude SARS-CoV-2 infection, do not rule out co-infections with other pathogens, and should  not be used as the sole basis for treatment or other patient management decisions. Negative results must be combined with clinical observations, patient history, and epidemiological information. The expected result is Negative. Fact Sheet for Patients: SugarRoll.be Fact Sheet for Healthcare Providers: https://www.woods-mathews.com/ This test is not yet approved or cleared by the [place] FDA and  has been authorized for detection and/or diagnosis of SARS-CoV-2 by FDA under an Emergency Use Authorization (EUA). This EUA will remain  in effect (meaning this test can be used) for the duration of the COVID-19 declaration under Section 56 4(b)(1) of the Act, 21 U.S.C. section 360bbb-3(b)(1), unless the authorization is terminated or revoked sooner. Performed at [facility], [place] [address], [place], [place] [address]      Labs: BNP (last 3 results) Recent Labs    02/08/20 1849  BNP 00.9   Basic Metabolic Panel: Recent Labs  Lab 02/08/20 1849 02/08/20 2130 02/09/20 0107  NA 137  --  139  K 5.2*  --  4.8  CL 110  --  111  CO2 18*  --  19*  GLUCOSE 214*  --  191*  BUN 41*  --  36*  CREATININE 2.37*  --  2.10*  CALCIUM 8.6*  --  8.5*  MG  --  2.1 2.1  PHOS  --  2.8 3.3   Liver Function Tests: Recent Labs  Lab 02/08/20 1849 02/09/20 0107  AST 14* 12*  ALT 18 18  ALKPHOS 67 70  BILITOT 0.5 0.3  PROT 6.7 6.7  ALBUMIN 3.4* 3.6   No results for input(s): LIPASE, AMYLASE in the last 168 hours. No results for input(s): AMMONIA in the last 168 hours. CBC: Recent Labs  Lab 02/08/20 1849 02/09/20 0107  WBC 9.1 8.7  NEUTROABS 6.9  --   HGB 11.5* 11.6*  HCT 35.4* 36.4*  MCV 92.9 92.6  PLT 189 177   Cardiac Enzymes: Recent Labs  Lab 02/08/20 2130  CKTOTAL 80   BNP: Invalid input(s): POCBNP CBG: Recent Labs  Lab 02/09/20 0028 02/09/20 0423 02/09/20 0908 02/09/20 1125 02/09/20 1629  GLUCAP 173* 105* 193*  183* 141*   D-Dimer No results for input(s): DDIMER in the last 72 hours. Hgb A1c No results for input(s): HGBA1C in the last 72 hours. Lipid Profile No results for input(s): CHOL, HDL, LDLCALC, TRIG, CHOLHDL, LDLDIRECT in the last 72 hours. Thyroid function studies Recent Labs    02/09/20 0107  TSH 1.344   Anemia work up No results for input(s): VITAMINB12, FOLATE, FERRITIN, TIBC, IRON, RETICCTPCT in the last 72 hours. Urinalysis    Component Value Date/Time   COLORURINE YELLOW 02/09/2020 0741   APPEARANCEUR CLEAR 02/09/2020 0741   LABSPEC 1.015 02/09/2020 0741   PHURINE 5.0 02/09/2020 0741   GLUCOSEU 150 (A) 02/09/2020 0741   GLUCOSEU 500 (A) 01/17/2020 1442   HGBUR NEGATIVE 02/09/2020 0741   BILIRUBINUR NEGATIVE 02/09/2020 0741   KETONESUR NEGATIVE 02/09/2020 0741   PROTEINUR NEGATIVE 02/09/2020 0741   UROBILINOGEN 0.2 01/17/2020 1442   NITRITE NEGATIVE 02/09/2020 0741   LEUKOCYTESUR NEGATIVE 02/09/2020 0741   Sepsis Labs Invalid input(s): PROCALCITONIN,  WBC,  LACTICIDVEN Microbiology Recent Results (from the past 240 hour(s))  SARS CORONAVIRUS 2 (TAT 6-24 HRS) Nasopharyngeal Nasopharyngeal Swab  Status: None   Collection Time: 02/08/20  7:56 PM   Specimen: Nasopharyngeal Swab  Result Value Ref Range Status   SARS Coronavirus 2 NEGATIVE NEGATIVE Final    Comment: (NOTE) SARS-CoV-2 target nucleic acids are NOT DETECTED. The SARS-CoV-2 RNA is generally detectable in upper and lower respiratory specimens during the acute phase of infection. Negative results do not preclude SARS-CoV-2 infection, do not rule out co-infections with other pathogens, and should not be used as the sole basis for treatment or other patient management decisions. Negative results must be combined with clinical observations, patient history, and epidemiological information. The expected result is Negative. Fact Sheet for Patients: SugarRoll.be Fact Sheet  for Healthcare Providers: https://www.woods-mathews.com/ This test is not yet approved or cleared by the [place] FDA and  has been authorized for detection and/or diagnosis of SARS-CoV-2 by FDA under an Emergency Use Authorization (EUA). This EUA will remain  in effect (meaning this test can be used) for the duration of the COVID-19 declaration under Section 56 4(b)(1) of the Act, 21 U.S.C. section 360bbb-3(b)(1), unless the authorization is terminated or revoked sooner. Performed at [facility], [place] [address] [place], [place] [address]      Time coordinating discharge: 32 minutes  SIGNED:   Barb Merino, MD  [facility] 02/09/2020, 5:00 PM

## 2020-02-09 NOTE — Progress Notes (Signed)
Orthostatic vital signs.  Lying: BP: 142/79      HR:98  Sitting: BP:  159/85        HR: 104  Standing: BP: 159/87     HR:108  Symptoms: None

## 2020-02-09 NOTE — Progress Notes (Signed)
Nsg Discharge Note  Admit Date:  02/08/2020 Discharge date: 02/09/2020   [name] to be D/C'd Home per MD order.  AVS completed.  Copy for chart, and copy for patient signed, and dated. Patient/caregiver able to verbalize understanding.  Discharge Medication: Allergies as of 02/09/2020   No Known Allergies     Medication List    STOP taking these medications   chlorthalidone 25 MG tablet Commonly known as: HYGROTON   glipiZIDE 5 MG tablet Commonly known as: GLUCOTROL   lisinopril 20 MG tablet Commonly known as: ZESTRIL   metFORMIN 500 MG (MOD) 24 hr tablet Commonly known as: GLUMETZA     TAKE these medications   amLODipine 5 MG tablet Commonly known as: NORVASC Take 1 tablet (5 mg total) by mouth daily. Notes to patient: Tomorrow, 02/10/20.   aspirin EC 81 MG tablet Take 81 mg by mouth daily. Notes to patient: Tomorrow, 02/10/20.   cilostazol 100 MG tablet Commonly known as: PLETAL Take 100 mg by mouth 2 (two) times daily. Notes to patient: Tomorrow, 02/10/20.   gabapentin 300 MG capsule Commonly known as: NEURONTIN Take 300 mg by mouth 3 (three) times daily. Notes to patient: Tonight, 02/09/20.   HumuLIN 70/30 (70-30) 100 UNIT/ML injection Generic drug: insulin NPH-regular Human INJECT 50 UNITS TWICE A DAY Notes to patient: Tonight, 02/09/20.   lovastatin 40 MG tablet Commonly known as: MEVACOR Take 40 mg by mouth at bedtime. Notes to patient: Tonight, 02/09/20.   traMADol 50 MG tablet Commonly known as: ULTRAM Take 50 mg by mouth every 6 (six) hours as needed for moderate pain.   Tylenol 8 Hour Arthritis Pain 650 MG CR tablet Generic drug: acetaminophen Take 650 mg by mouth every 8 (eight) hours as needed for pain.   warfarin 6 MG tablet Commonly known as: COUMADIN Take as directed. If you are unsure how to take this medication, talk to your nurse or doctor. Original instructions: Take 6 mg by mouth See admin instructions. Take 6mg  tablet along with the 2mg   tablet daily for total of 8mg  except on Wednesday and fridays take 5mg  tablet per patient. What changed: Another medication with the same name was changed. Make sure you understand how and when to take each. Notes to patient: Tomorrow, 02/10/20.   warfarin 2 MG tablet Commonly known as: COUMADIN Take as directed. If you are unsure how to take this medication, talk to your nurse or doctor. Original instructions: Take 2 mg by mouth See admin instructions. Take 2mg  tablet along with the 6mg  tablet daily for total of 8mg  except on Wednesday and fridays take 5mg  tablet per patient. What changed: Another medication with the same name was changed. Make sure you understand how and when to take each. Notes to patient: Tomorrow, 02/10/20.   warfarin 5 MG tablet Commonly known as: COUMADIN Take as directed. If you are unsure how to take this medication, talk to your nurse or doctor. Original instructions: Take 1 tablet (5 mg total) by mouth daily. Take as directed. What changed: additional instructions Notes to patient: Tonight, 02/09/20.       Discharge Assessment: Vitals:   02/09/20 0949 02/09/20 1241  BP: (!) 149/84 (!) 144/75  Pulse: 82 97  Resp: 18 16  Temp: 97.8 F (36.6 C) 97.8 F (36.6 C)  SpO2: 100% 100%   Skin clean, dry and intact without evidence of skin break down, no evidence of skin tears noted. IV catheter discontinued intact. Site without signs and symptoms of complications -  no redness or edema noted at insertion site, patient denies c/o pain - only slight tenderness at site.  Dressing with slight pressure applied.  D/c Instructions-Education: Discharge instructions given to patient with verbalized understanding. D/c education completed with patient including follow up instructions, medication list, d/c activities limitations if indicated, with other d/c instructions as indicated by MD - patient able to verbalize understanding, all questions fully answered. Patient instructed to  return to ED, call 911, or call MD for any changes in condition.  Patient escorted via [place], and D/C home via private auto.  Joni Reining, RN 02/09/2020 7:23 PM

## 2020-02-09 NOTE — Progress Notes (Signed)
ANTICOAGULATION CONSULT NOTE - Initial Consult  Pharmacy Consult for Warfarin Indication: h/o PE (20 yrs ago)  No Known Allergies  Patient Measurements: Height: 6\' 1"  (185.4 cm) Weight: 108.9 kg (240 lb) IBW/kg (Calculated) : 79.9  Vital Signs: Temp: 97.9 F (36.6 C) (04/06 1822) Temp Source: Oral (04/06 1822) BP: 135/75 (04/06 2345) Pulse Rate: 83 (04/06 2336)  Labs: Recent Labs    02/08/20 1849 02/08/20 2130  HGB 11.5*  --   HCT 35.4*  --   PLT 189  --   LABPROT  --  17.1*  INR  --  1.4*  CREATININE 2.37*  --   CKTOTAL  --  80  TROPONINIHS 5 6    Estimated Creatinine Clearance: 40.2 mL/min (A) (by C-G formula based on SCr of 2.37 mg/dL (H)).   Medical History: Past Medical History:  Diagnosis Date  . Arthritis   . Diabetes mellitus without complication ([place])   . History of pulmonary embolus (PE)     Medications:  Medications Prior to Admission  Medication Sig Dispense Refill Last Dose  . acetaminophen (TYLENOL 8 HOUR ARTHRITIS PAIN) 650 MG CR tablet Take 650 mg by mouth every 8 (eight) hours as needed for pain.   02/08/2020 at Unknown time  . aspirin EC 81 MG tablet Take 81 mg by mouth daily.   02/08/2020 at Unknown time  . cilostazol (PLETAL) 100 MG tablet Take 100 mg by mouth 2 (two) times daily.   02/08/2020 at Unknown time  . gabapentin (NEURONTIN) 300 MG capsule Take 300 mg by mouth 3 (three) times daily.   02/08/2020 at Unknown time  . HUMULIN 70/30 (70-30) 100 UNIT/ML injection INJECT 50 UNITS TWICE A DAY 20 mL 1 02/08/2020 at Unknown time  . lisinopril (ZESTRIL) 20 MG tablet Take 1 tablet (20 mg total) by mouth daily. 30 tablet 1 02/08/2020 at Unknown time  . lovastatin (MEVACOR) 40 MG tablet Take 40 mg by mouth at bedtime.   02/07/2020 at Unknown time  . traMADol (ULTRAM) 50 MG tablet Take 50 mg by mouth every 6 (six) hours as needed for moderate pain.    02/08/2020 at Unknown time  . warfarin (COUMADIN) 2 MG tablet Take 2 mg by mouth See admin instructions. Take  2mg  tablet along with the 6mg  tablet daily for total of 8mg  except on Wednesday and fridays take 5mg  tablet per patient.   02/08/2020 at 1100  . warfarin (COUMADIN) 5 MG tablet Take 1 tablet (5 mg total) by mouth daily. Take as directed. (Patient taking differently: Take 5 mg by mouth daily. Take 5mg  tablet by mouth on Wednesdays and Fridays, then take a 8mg  tablet by mouth rest of days per patient) 30 tablet 2 Past Week at 1100  . warfarin (COUMADIN) 6 MG tablet Take 6 mg by mouth See admin instructions. Take 6mg  tablet along with the 2mg  tablet daily for total of 8mg  except on Wednesday and fridays take 5mg  tablet per patient.   02/08/2020 at Unknown time  . chlorthalidone (HYGROTON) 25 MG tablet Take 1 tablet (25 mg total) by mouth daily. (Patient not taking: Reported on 01/26/2020) 30 tablet 1   . glipiZIDE (GLUCOTROL) 5 MG tablet Take by mouth daily before breakfast.     . metFORMIN (GLUMETZA) 500 MG (MOD) 24 hr tablet Take 500 mg by mouth daily with breakfast.       Assessment: 64 y.o. M presents with LOC. Pt on warfarin PTA for h/o PE 66 yrs ago. Admission INR 1.4.  CBC ok on admission. Home dose: 8mg  daily except for 5mg  on Wed and Fri  Goal of Therapy:  INR 2-3 Monitor platelets by anticoagulation protocol: Yes   Plan:  Coumadin 10mg  now Daily INR  Sherlon Handing, PharmD, BCPS Please see amion for complete clinical pharmacist phone list 02/09/2020,12:14 AM

## 2020-02-09 NOTE — Plan of Care (Signed)
  Problem: Education: Goal: Knowledge of General Education information will improve Description: Including pain rating scale, medication(s)/side effects and non-pharmacologic comfort measures Outcome: Progressing  Patient able to recall blood pressure lowering medications and action of each.  Problem: Health Behavior/Discharge Planning: Goal: Ability to manage health-related needs will improve Outcome: Progressing  Patient able to discern symptoms of hypoglycemia versus hyperglycemia.  Problem: Activity: Goal: Risk for activity intolerance will decrease Outcome: Progressing  Patient able to get up to bathroom with minimal assistance, denies SOB.

## 2020-02-09 NOTE — Progress Notes (Signed)
Carotid artery duplex and left lower extremity venous duplex has been completed. Preliminary results can be found in CV Proc through chart review.  Results were given to Ala RN.  02/09/20 2:32 PM Carlos Levering RVT

## 2020-02-09 NOTE — Progress Notes (Signed)
Echocardiogram 2D Echocardiogram has been performed.  [name] 02/09/2020, 11:31 AM

## 2020-02-09 NOTE — Discharge Instructions (Signed)
Near-Syncope Near-syncope is when you suddenly get weak or dizzy, or you feel like you might pass out (faint). This may also be called presyncope. This is due to a lack of blood flow to the brain. During an episode of near-syncope, you may:  Feel dizzy, weak, or light-headed.  Feel sick to your stomach (nauseous).  See all white or all black.  See spots.  Have cold, clammy skin. This condition is caused by a sudden decrease in blood flow to the brain. This decrease can result from various causes, but most of those causes are not dangerous. However, near-syncope may be a sign of a serious medical problem, so it is important to seek medical care. Follow these instructions at home: Medicines  Take over-the-counter and prescription medicines only as told by your doctor.  If you are taking blood pressure or heart medicine, get up slowly and spend many minutes getting ready to sit and then stand. This can help with dizziness. General instructions  Be aware of any changes in your symptoms.  Talk with your doctor about your symptoms. You may need to have testing to find the cause of your near-syncope.  If you start to feel like you might pass out, lie down right away. Raise (elevate) your feet above the level of your heart. Breathe deeply and steadily. Wait until all of the symptoms are gone.  Have someone stay with you until you feel stable.  Do not drive, use machinery, or play sports until your doctor says it is okay.  Drink enough fluid to keep your pee (urine) pale yellow.  Keep all follow-up visits as told by your doctor. This is important. Get help right away if you:  Have a seizure.  Have pain in your: ? Chest. ? Belly (abdomen). ? Back.  Faint once or more than once.  Have a very bad headache.  Are bleeding from your mouth or butt.  Have black or tarry poop (stool).  Have a very fast or uneven heartbeat (palpitations).  Are mixed up (confused).  Have trouble  walking.  Are very weak.  Have trouble seeing. These symptoms may be an emergency. Do not wait to see if the symptoms will go away. Get medical help right away. Call your local emergency services (911 in the [place] Do not drive yourself to the hospital. Summary  Near-syncope is when you suddenly get weak or dizzy, or you feel like you might pass out (faint).  This condition is caused by a lack of blood flow to the brain.  Near-syncope may be a sign of a serious medical problem, so it is important to seek medical care. This information is not intended to replace advice given to you by your health care provider. Make sure you discuss any questions you have with your health care provider. Document Revised: 02/12/2019 Document Reviewed: 09/09/2018 Elsevier Patient Education  2020 Elsevier Inc.  

## 2020-02-09 NOTE — Plan of Care (Signed)
Patient verbalizes understanding to change coumadin dose.

## 2020-02-10 ENCOUNTER — Telehealth: Payer: Self-pay | Admitting: *Deleted

## 2020-02-10 LAB — HEMOGLOBIN A1C
Hgb A1c MFr Bld: 8.6 % — ABNORMAL HIGH (ref 4.8–5.6)
Mean Plasma Glucose: 200 mg/dL

## 2020-02-10 NOTE — Telephone Encounter (Signed)
1st attempt. Unable to reach patient. LVM for pt to call office to schedule hospital follow up appointment.   

## 2020-02-11 ENCOUNTER — Ambulatory Visit: Payer: Medicare Other | Admitting: Family Medicine

## 2020-02-11 NOTE — Telephone Encounter (Signed)
I have made two attempts and have been unable to reach patient. I have mailed the patient a letter requesting they call the office to schedule a hospital follow up appointment.   

## 2020-02-17 ENCOUNTER — Ambulatory Visit (INDEPENDENT_AMBULATORY_CARE_PROVIDER_SITE_OTHER): Payer: Medicare Other | Admitting: Behavioral Health

## 2020-02-17 ENCOUNTER — Other Ambulatory Visit: Payer: Self-pay

## 2020-02-17 DIAGNOSIS — Z7901 Long term (current) use of anticoagulants: Secondary | ICD-10-CM | POA: Diagnosis not present

## 2020-02-17 DIAGNOSIS — Z86711 Personal history of pulmonary embolism: Secondary | ICD-10-CM

## 2020-02-17 LAB — POCT INR: INR: 2.9 (ref 2.0–3.0)

## 2020-02-17 NOTE — Patient Instructions (Signed)
Per Dr. Bryan Lemma: Continue taking Coumadin 5 mg Wednesday and Friday. All other days take Coumadin 8 mg. Recheck INR in 2 weeks.

## 2020-02-17 NOTE — Progress Notes (Signed)
Patient presents in clinic today for INR check. He voiced adherence to current medication regimen. Patient reported that he was recently admitted to the hospital on 02/08/2020 for low blood pressure and changes were made to those medications. All other patient findings were negative. INR reading during today's visit was 2.9.  Per Dr. Bryan Lemma: Continue taking Coumadin 5 mg Wednesday and Friday. All other days take Coumadin 8 mg. Recheck INR in 2 weeks.  Informed patient of the provider's recommendations. He verbalized understanding and did not have any further questions or concerns.  Next appointment has been scheduled for 03/02/20 at 10:00 AM.

## 2020-02-18 ENCOUNTER — Ambulatory Visit (INDEPENDENT_AMBULATORY_CARE_PROVIDER_SITE_OTHER): Payer: Medicare Other | Admitting: Internal Medicine

## 2020-02-18 ENCOUNTER — Encounter: Payer: Self-pay | Admitting: Internal Medicine

## 2020-02-18 VITALS — BP 156/80 | HR 109 | Ht 73.5 in | Wt 243.8 lb

## 2020-02-18 DIAGNOSIS — E7800 Pure hypercholesterolemia, unspecified: Secondary | ICD-10-CM | POA: Diagnosis not present

## 2020-02-18 DIAGNOSIS — R55 Syncope and collapse: Secondary | ICD-10-CM

## 2020-02-18 DIAGNOSIS — I10 Essential (primary) hypertension: Secondary | ICD-10-CM

## 2020-02-18 DIAGNOSIS — Z7901 Long term (current) use of anticoagulants: Secondary | ICD-10-CM

## 2020-02-18 DIAGNOSIS — E1142 Type 2 diabetes mellitus with diabetic polyneuropathy: Secondary | ICD-10-CM

## 2020-02-18 DIAGNOSIS — Z794 Long term (current) use of insulin: Secondary | ICD-10-CM

## 2020-02-18 DIAGNOSIS — Z86711 Personal history of pulmonary embolism: Secondary | ICD-10-CM

## 2020-02-18 DIAGNOSIS — N184 Chronic kidney disease, stage 4 (severe): Secondary | ICD-10-CM

## 2020-02-18 MED ORDER — AMLODIPINE BESYLATE 10 MG PO TABS: 10.0000 mg | ORAL_TABLET | Freq: Every day | ORAL | 3 refills | Status: DC

## 2020-02-18 NOTE — Patient Instructions (Signed)
Medication Instructions:  INCREASE AMLODIPINE TO 10 MG ONE TABLET DAILY  *If you need a refill on your cardiac medications before your next appointment, please call your pharmacy*   Lab Work: NOT NEEDED   Testing/Procedures: NOT NEEDED    Follow-Up: At [facility], you and your health needs are our priority.  As part of our continuing mission to provide you with exceptional heart care, we have created designated Provider Care Teams.  These Care Teams include your primary Cardiologist (physician) and Advanced Practice Providers (APPs -  Physician Assistants and Nurse Practitioners) who all work together to provide you with the care you need, when you need it.    Your next appointment:   3 month(s)  The format for your next appointment:   In Person  Provider:   Cherlynn Kaiser, MD   Other Instructions N/A

## 2020-02-18 NOTE — Progress Notes (Signed)
Cardiology Office Note:    Date:  02/18/2020   ID:  [name], DOB [birth year], MRN [id number]  PCP:  [name], MD  Cardiologist:  Elouise Munroe, MD  Electrophysiologist:  None   Referring MD: [name],*   Chief Complaint: hospital follow up  History of Present Illness:    [name] is a 64 y.o. male with a history of PE on Coumadin, diabetes on insulin, stage IV chronic kidney disease with baseline creatinine around 1.7, recent kidney stone and emergency room visit with AKI. We saw him in hospital for an episode of syncope which responded to IV fluids and stopping his lisinopril and chlorthalidone.  He feels well since hospital discharge and denies chest pain, shortness of breath, recurrent syncope. Blood pressure has been mildly elevated since hospital discharge given his cessation of antihypertensive therapy. Average blood pressure at home has been 142/82. He is otherwise well, we discussed adequate hydration.   Past Medical History:  Diagnosis Date  . Arthritis   . Diabetes mellitus without complication ([place])   . History of pulmonary embolus (PE)   . Hypertension   . Kidney stones   . Lipidemia   . Prostate enlargement     No past surgical history on file.  Current Medications: Current Meds  Medication Sig  . acetaminophen (TYLENOL 8 HOUR ARTHRITIS PAIN) 650 MG CR tablet Take 650 mg by mouth every 8 (eight) hours as needed for pain.  [name] aspirin EC 81 MG tablet Take 81 mg by mouth daily.  . cilostazol (PLETAL) 100 MG tablet Take 100 mg by mouth 2 (two) times daily.  [name] gabapentin (NEURONTIN) 300 MG capsule Take 300 mg by mouth 3 (three) times daily.  [name] lovastatin (MEVACOR) 40 MG tablet Take 40 mg by mouth at bedtime.  . traMADol (ULTRAM) 50 MG tablet Take 50 mg by mouth every 6 (six) hours as needed for moderate pain.   [name] warfarin (COUMADIN) 2 MG tablet Take 2 mg by mouth See admin instructions. Take 2mg  tablet along with the 6mg  tablet  daily for total of 8mg  except on Wednesday and fridays take 5mg  tablet per patient.  . warfarin (COUMADIN) 5 MG tablet Take 1 tablet (5 mg total) by mouth daily. Take as directed. (Patient taking differently: Take 5 mg by mouth daily. Take 5mg  tablet by mouth on Wednesdays and Fridays, then take a 8mg  tablet by mouth rest of days per patient)  . warfarin (COUMADIN) 6 MG tablet Take 6 mg by mouth See admin instructions. Take 6mg  tablet along with the 2mg  tablet daily for total of 8mg  except on Wednesday and fridays take 5mg  tablet per patient.  . [DISCONTINUED] amLODipine (NORVASC) 5 MG tablet Take 1 tablet (5 mg total) by mouth daily.  . [DISCONTINUED] HUMULIN 70/30 (70-30) 100 UNIT/ML injection INJECT 50 UNITS TWICE A DAY     Allergies:   Lisinopril   Social History   Socioeconomic History  . Marital status: Significant Other    Spouse name: Not on file  . Number of children: Not on file  . Years of education: Not on file  . Highest education level: Not on file  Occupational History  . Occupation: retired  Tobacco Use  . Smoking status: Never Smoker  . Smokeless tobacco: Never Used  Substance and Sexual Activity  . Alcohol use: Yes    Alcohol/week: 1.0 standard drinks    Types: 1 Cans of beer per week    Comment: social  .  Drug use: Never  . Sexual activity: Yes  Other Topics Concern  . Not on file  Social History Narrative  . Not on file   Social Determinants of Health   Financial Resource Strain:   . Difficulty of Paying Living Expenses:   Food Insecurity:   . Worried About Charity fundraiser in the Last Year:   . Arboriculturist in the Last Year:   Transportation Needs:   . Film/video editor (Medical):   [name] Lack of Transportation (Non-Medical):   Physical Activity:   . Days of Exercise per Week:   . Minutes of Exercise per Session:   Stress:   . Feeling of Stress :   Social Connections:   . Frequency of Communication with Friends and Family:   . Frequency  of Social Gatherings with Friends and Family:   . Attends Religious Services:   . Active Member of Clubs or Organizations:   . Attends Archivist Meetings:   [name] Marital Status:      Family History: The patient's family history includes Cancer in his mother; Heart disease in his father.  ROS:   Please see the history of present illness.    All other systems reviewed and are negative.  EKGs/Labs/Other Studies Reviewed:    The following studies were reviewed today:  EKG:  n/a  Recent Labs: 02/08/2020: B Natriuretic Peptide 11.3 02/09/2020: ALT 18; Hemoglobin 11.6; Magnesium 2.1; Platelets 177; TSH 1.344 02/25/2020: BUN 24; Creatinine, Ser 1.70; Potassium 4.4; Sodium 138  Recent Lipid Panel    Component Value Date/Time   CHOL 156 12/22/2019 0949   TRIG 251.0 (H) 12/22/2019 0949   HDL 41.40 12/22/2019 0949   CHOLHDL 4 12/22/2019 0949   VLDL 50.2 (H) 12/22/2019 0949   LDLDIRECT 80.0 12/22/2019 0949    Physical Exam:    VS:  BP (!) 156/80   Pulse (!) 109   Ht 6' 1.5" (1.867 m)   Wt 243 lb 12.8 oz (110.6 kg)   SpO2 96%   BMI 31.73 kg/m     Wt Readings from Last 5 Encounters:  02/25/20 244 lb 6.4 oz (110.9 kg)  02/23/20 246 lb 6.4 oz (111.8 kg)  02/18/20 243 lb 12.8 oz (110.6 kg)  02/08/20 240 lb (108.9 kg)  01/26/20 244 lb 6.4 oz (110.9 kg)     Constitutional: No acute distress Eyes: sclera non-icteric, normal conjunctiva and lids ENMT: Mask in place Cardiovascular: regular rhythm, normal rate, no murmurs. S1 and S2 normal. Radial pulses normal bilaterally. No jugular venous distention.  Respiratory: clear to auscultation bilaterally GI : normal bowel sounds, soft and nontender. No distention.   MSK: extremities warm, well perfused. Mild bilateral lower extremity edema.  NEURO: grossly nonfocal exam, moves all extremities. PSYCH: alert and oriented x 3, normal mood and affect.   ASSESSMENT:    1. Vasovagal syncope   2. Elevated cholesterol   3. Essential  hypertension   4. Type 2 diabetes mellitus with diabetic polyneuropathy, with long-term current use of insulin ([place])   5. CKD (chronic kidney disease) stage 4, GFR 15-29 ml/min (HCC)   6. Long term (current) use of anticoagulants   7. 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 We discussed adequate hydration. Antihypertensive therapy was discontinued for period of time after hospital stay, however blood pressure has been elevated since hospital dismissal. He does have chronic lower extremity edema,  however I think with his renal dysfunction he will tolerate amlodipine well. We will try this and if unsuccessful we will consider alternate therapy. Initiate amlodipine 5 mg daily.  Elevated cholesterol-continue lovastatin  Essential hypertension-we will start amlodipine today, lisinopril chlorthalidone on hold.  Type 2 diabetes mellitus with diabetic polyneuropathy, with long-term current use of insulin (HCC)-per PCP  Long term (current) use of anticoagulants History of pulmonary embolism -No bleeding events, recent INR is 2.9  Total time of encounter: 30 minutes total time of encounter, including 22 minutes spent in face-to-face patient care on the date of this encounter. This time includes coordination of care and counseling regarding above mentioned problem list. Remainder of non-face-to-face time involved reviewing chart documents/testing relevant to the patient encounter and documentation in the medical record. I have independently reviewed documentation from referring provider.   Cherlynn Kaiser, MD [place]  [facility]    Medication Adjustments/Labs and Tests Ordered: Current medicines are reviewed at length with the patient today.  Concerns regarding medicines are outlined above.  No orders of the defined types were placed in this encounter.  Meds ordered this encounter   Medications  . amLODipine (NORVASC) 10 MG tablet    Sig: Take 1 tablet (10 mg total) by mouth daily.    Dispense:  90 tablet    Refill:  3    Patient Instructions  Medication Instructions:  INCREASE AMLODIPINE TO 10 MG ONE TABLET DAILY  *If you need a refill on your cardiac medications before your next appointment, please call your pharmacy*   Lab Work: NOT NEEDED   Testing/Procedures: NOT NEEDED    Follow-Up: At [facility], you and your health needs are our priority.  As part of our continuing mission to provide you with exceptional heart care, we have created designated Provider Care Teams.  These Care Teams include your primary Cardiologist (physician) and Advanced Practice Providers (APPs -  Physician Assistants and Nurse Practitioners) who all work together to provide you with the care you need, when you need it.    Your next appointment:   3 month(s)  The format for your next appointment:   In Person  Provider:   Cherlynn Kaiser, MD   Other Instructions N/A

## 2020-02-21 ENCOUNTER — Other Ambulatory Visit: Payer: Self-pay | Admitting: Family Medicine

## 2020-02-21 DIAGNOSIS — N184 Chronic kidney disease, stage 4 (severe): Secondary | ICD-10-CM

## 2020-02-21 DIAGNOSIS — I10 Essential (primary) hypertension: Secondary | ICD-10-CM

## 2020-02-23 ENCOUNTER — Other Ambulatory Visit: Payer: Self-pay

## 2020-02-23 ENCOUNTER — Ambulatory Visit (INDEPENDENT_AMBULATORY_CARE_PROVIDER_SITE_OTHER): Payer: Medicare Other | Admitting: Internal Medicine

## 2020-02-23 ENCOUNTER — Encounter: Payer: Self-pay | Admitting: Internal Medicine

## 2020-02-23 VITALS — BP 118/62 | HR 104 | Temp 98.1°F | Ht 74.0 in | Wt 246.4 lb

## 2020-02-23 DIAGNOSIS — E1142 Type 2 diabetes mellitus with diabetic polyneuropathy: Secondary | ICD-10-CM | POA: Insufficient documentation

## 2020-02-23 DIAGNOSIS — Z794 Long term (current) use of insulin: Secondary | ICD-10-CM | POA: Diagnosis not present

## 2020-02-23 DIAGNOSIS — E1165 Type 2 diabetes mellitus with hyperglycemia: Secondary | ICD-10-CM

## 2020-02-23 LAB — GLUCOSE, POCT (MANUAL RESULT ENTRY): POC Glucose: 295 mg/dl — AB (ref 70–99)

## 2020-02-23 MED ORDER — HUMULIN 70/30 (70-30) 100 UNIT/ML ~~LOC~~ SUSP: 52.0000 [IU] | Freq: Two times a day (BID) | SUBCUTANEOUS | 3 refills | Status: DC

## 2020-02-23 MED ORDER — ACCU-CHEK GUIDE VI STRP: ORAL_STRIP | 12 refills | Status: DC

## 2020-02-23 MED ORDER — INSULIN SYRINGES (DISPOSABLE) U-100 1 ML MISC: 1.0000 | 11 refills | Status: DC

## 2020-02-23 NOTE — Patient Instructions (Signed)
-   Increase Humulin Mix to 52 units Before breakfast and supper   - Check sugar before Breakfast and supper     HOW TO TREAT LOW BLOOD SUGARS (Blood sugar LESS THAN 70 MG/DL)  Please follow the RULE OF 15 for the treatment of hypoglycemia treatment (when your (blood sugars are less than 70 mg/dL)    STEP 1: Take 15 grams of carbohydrates when your blood sugar is low, which includes:   3-4 GLUCOSE TABS  OR  3-4 OZ OF JUICE OR REGULAR SODA OR  ONE TUBE OF GLUCOSE GEL     STEP 2: RECHECK blood sugar in 15 MINUTES STEP 3: If your blood sugar is still low at the 15 minute recheck --> then, go back to STEP 1 and treat AGAIN with another 15 grams of carbohydrates.

## 2020-02-23 NOTE — Progress Notes (Signed)
Name: [name]  MRN/ DOB: [id number], 1956/01/03   Age/ Sex: 64 y.o., male    PCP: [name], MD   Reason for Endocrinology Evaluation: Type 2 Diabetes Mellitus     Date of Initial Endocrinology Visit: 02/23/2020     PATIENT IDENTIFIER: [name] is a 64 y.o. male with a past medical history of HTN, T2DM and Hx of PE. The patient presented for initial endocrinology clinic visit on 02/23/2020 for consultative assistance with his diabetes management.    HPI: [name] was    Diagnosed with DM in 2009 Prior Medications tried/Intolerance: metformin- stopped 12/2019 due to AKI as well as Glyburide  Currently checking blood sugars 2 x / day Hypoglycemia episodes : no               Hemoglobin A1c has ranged from 7.6% ,peaking at 8.6% in 2021. Patient required assistance for hypoglycemia: no  Patient has required hospitalization within the last 1 year from hyper or hypoglycemia: no   In terms of diet, the patient eats 2 meals, snacks instead of lunch . Avoids sugar sweetened beverages   Moved from [place]  ~ early 2021     HOME DIABETES REGIMEN: Humulin 70/30 50 units BID    Statin: yes ACE-I/ARB: Intolerant to Lisinopril to AKI Prior Diabetic Education: no    METER DOWNLOAD SUMMARY: Did not bring    DIABETIC COMPLICATIONS: Microvascular complications:   Low GFR , neuropathy  Denies: retinopathy  Last eye exam: Completed 2018  Macrovascular complications:     Denies: CAD, PVD, CVA   PAST HISTORY: Past Medical History:  Past Medical History:  Diagnosis Date  . Arthritis   . Diabetes mellitus without complication ([place])   . History of pulmonary embolus (PE)   . Hypertension   . Kidney stones   . Lipidemia   . Prostate enlargement     Past Surgical History: No past surgical history on file.   Social History:  reports that he has never smoked. He has never used smokeless tobacco. He reports current alcohol use of about 1.0 standard drinks  of alcohol per week. He reports that he does not use drugs. Family History:  Family History  Problem Relation Age of Onset  . Cancer Mother   . Heart disease Father      HOME MEDICATIONS: Allergies as of 02/23/2020      Reactions   Lisinopril Other (See Comments)   Patient states that this medication makes him feel "off".       Medication List       Accurate as of February 23, 2020  1:31 PM. If you have any questions, ask your nurse or doctor.        Accu-Chek Aviva device by Other route. Use as instructed   amLODipine 10 MG tablet Commonly known as: NORVASC Take 1 tablet (10 mg total) by mouth daily.   aspirin EC 81 MG tablet Take 81 mg by mouth daily.   cilostazol 100 MG tablet Commonly known as: PLETAL Take 100 mg by mouth 2 (two) times daily.   gabapentin 300 MG capsule Commonly known as: NEURONTIN Take 300 mg by mouth 3 (three) times daily.   HumuLIN 70/30 (70-30) 100 UNIT/ML injection Generic drug: insulin NPH-regular Human INJECT 50 UNITS TWICE A DAY   lovastatin 40 MG tablet Commonly known as: MEVACOR Take 40 mg by mouth at bedtime.   traMADol 50 MG tablet Commonly known as: ULTRAM Take 50 mg by  mouth every 6 (six) hours as needed for moderate pain.   Tylenol 8 Hour Arthritis Pain 650 MG CR tablet Generic drug: acetaminophen Take 650 mg by mouth every 8 (eight) hours as needed for pain.   warfarin 6 MG tablet Commonly known as: COUMADIN Take as directed by the anticoagulation clinic. If you are unsure how to take this medication, talk to your nurse or doctor. Original instructions: Take 6 mg by mouth See admin instructions. Take 6mg  tablet along with the 2mg  tablet daily for total of 8mg  except on Wednesday and fridays take 5mg  tablet per patient. What changed: Another medication with the same name was changed. Make sure you understand how and when to take each.   warfarin 2 MG tablet Commonly known as: COUMADIN Take as directed by the  anticoagulation clinic. If you are unsure how to take this medication, talk to your nurse or doctor. Original instructions: Take 2 mg by mouth See admin instructions. Take 2mg  tablet along with the 6mg  tablet daily for total of 8mg  except on Wednesday and fridays take 5mg  tablet per patient. What changed: Another medication with the same name was changed. Make sure you understand how and when to take each.   warfarin 5 MG tablet Commonly known as: COUMADIN Take as directed by the anticoagulation clinic. If you are unsure how to take this medication, talk to your nurse or doctor. Original instructions: Take 1 tablet (5 mg total) by mouth daily. Take as directed. What changed: additional instructions        ALLERGIES: Allergies  Allergen Reactions  . Lisinopril Other (See Comments)    Patient states that this medication makes him feel "off".      REVIEW OF SYSTEMS: A comprehensive ROS was conducted with the patient and is negative except as per HPI and below:  Review of Systems  Gastrointestinal: Negative for diarrhea and nausea.  Genitourinary: Negative for frequency.  Musculoskeletal: Positive for joint pain.  Neurological: Positive for tingling.  Endo/Heme/Allergies: Negative for polydipsia.      OBJECTIVE:   VITAL SIGNS: BP 118/62 (BP Location: Left Arm, Patient Position: Sitting, Cuff Size: Large)   Pulse (!) 104   Temp 98.1 F (36.7 C)   Ht 6\' 2"  (1.88 m)   Wt 246 lb 6.4 oz (111.8 kg)   SpO2 94%   BMI 31.64 kg/m    PHYSICAL EXAM:  General: Pt appears well and is in NAD  HEENT:  Eyes: External eye exam normal without stare, lid lag or exophthalmos.  EOM intact.   Neck: General: Supple without adenopathy or carotid bruits. Thyroid: Thyroid size normal.  No goiter or nodules appreciated. No thyroid bruit.  Lungs: Clear with good BS bilat with no rales, rhonchi, or wheezes  Heart: RRR with normal S1 and S2 and no gallops; no murmurs; no rub  Abdomen: Normoactive  bowel sounds, soft, nontender, without masses or organomegaly palpable  Extremities:  Lower extremities - No pretibial edema. No lesions.  Skin: Normal texture and temperature to palpation.  Neuro: MS is good with appropriate affect, pt is alert and Ox3    DM foot exam: 02/23/2020  The skin of the feet is without sores or ulcerations. Toe nails thickened The pedal pulses are 2+ on right and 2+ on left. The sensation is decreased to a screening 5.07, 10 gram monofilament bilaterally   DATA REVIEWED:  Lab Results  Component Value Date   HGBA1C 8.6 (H) 02/09/2020   HGBA1C 8.8 (H) 12/22/2019  Lab Results  Component Value Date   MICROALBUR 4.9 (H) 12/22/2019   CREATININE 2.10 (H) 02/09/2020   Lab Results  Component Value Date   MICRALBCREAT 3.5 12/22/2019    Lab Results  Component Value Date   CHOL 156 12/22/2019   HDL 41.40 12/22/2019   LDLDIRECT 80.0 12/22/2019   TRIG 251.0 (H) 12/22/2019   CHOLHDL 4 12/22/2019       In-Office BG 295 mg/dL  ASSESSMENT / PLAN / RECOMMENDATIONS:   1) Type 2 Diabetes Mellitus, Poorly controlled, With Neuropathic  complications - Most recent A1c of 8.6 %. Goal A1c < 7.0 %.    Plan: GENERAL: I have discussed with the patient the pathophysiology of diabetes. We went over the natural progression of the disease. We talked about both insulin resistance and insulin deficiency. We stressed the importance of lifestyle changes including diet and exercise. I explained the complications associated with diabetes including retinopathy, nephropathy, neuropathy as well as increased risk of cardiovascular disease. We went over the benefit seen with glycemic control.    I explained to the patient that diabetic patients are at higher than normal risk for amputations.   We discussed the importance of checking glucose at home and having this data available to me . Today his postprandial glucose was 295 mg/dL after taking 50 units with his meal, will increase  insulin.   Pt prefers vials   MEDICATIONS:  Increase Humulin Mix 52 units BID   EDUCATION / INSTRUCTIONS:  BG monitoring instructions: Patient is instructed to check his blood sugars 2 times a day, before breakfast and supper  Call [facility] if: BG persistently < 70 or > 300. . I reviewed the Rule of 15 for the treatment of hypoglycemia in detail with the patient. Literature supplied.   2) Diabetic complications:   Eye: Unknown to  have known diabetic retinopathy. Pt urged to have annual eye exams  Neuro/ Feet: Does  have known diabetic peripheral neuropathy.  Renal: Unknown if he has a baseline CKD, or just AKI. He is intolerant to lisinopril.    3) Lipids: Patient is on a statin.    f/u in 3 months        Signed electronically by: Mack Guise, MD  [facility] End[facility]  [address] [place], [place] [address] Phone: [phone] FAX: [phone]   CC: [name], [place] [address] Phone: [phone]  Fax: [phone]    Return to Endocrinology clinic as below: Future Appointments  Date Time Provider [name]  02/25/2020 10:30 AM [name], MD [facility]  03/02/2020 10:00 AM [facility] NURSE [facility]

## 2020-02-25 ENCOUNTER — Other Ambulatory Visit: Payer: Self-pay

## 2020-02-25 ENCOUNTER — Ambulatory Visit (INDEPENDENT_AMBULATORY_CARE_PROVIDER_SITE_OTHER): Payer: Medicare Other | Admitting: Family Medicine

## 2020-02-25 ENCOUNTER — Encounter: Payer: Self-pay | Admitting: Family Medicine

## 2020-02-25 VITALS — BP 150/72 | HR 94 | Temp 97.0°F | Ht 74.0 in | Wt 244.4 lb

## 2020-02-25 DIAGNOSIS — Z09 Encounter for follow-up examination after completed treatment for conditions other than malignant neoplasm: Secondary | ICD-10-CM | POA: Insufficient documentation

## 2020-02-25 DIAGNOSIS — N184 Chronic kidney disease, stage 4 (severe): Secondary | ICD-10-CM | POA: Diagnosis not present

## 2020-02-25 DIAGNOSIS — I10 Essential (primary) hypertension: Secondary | ICD-10-CM | POA: Diagnosis not present

## 2020-02-25 LAB — BASIC METABOLIC PANEL
BUN: 24 mg/dL — ABNORMAL HIGH (ref 6–23)
CO2: 25 mEq/L (ref 19–32)
Calcium: 9 mg/dL (ref 8.4–10.5)
Chloride: 104 mEq/L (ref 96–112)
Creatinine, Ser: 1.7 mg/dL — ABNORMAL HIGH (ref 0.40–1.50)
GFR: 40.56 mL/min — ABNORMAL LOW (ref >60.00)
Glucose, Bld: 162 mg/dL — ABNORMAL HIGH (ref 70–99)
Potassium: 4.4 mEq/L (ref 3.5–5.1)
Sodium: 138 mEq/L (ref 135–145)

## 2020-02-25 MED ORDER — LOSARTAN POTASSIUM 50 MG PO TABS: 50.0000 mg | ORAL_TABLET | Freq: Every day | ORAL | 0 refills | Status: DC

## 2020-02-25 NOTE — Progress Notes (Signed)
Established Patient Office Visit  Subjective:  Patient ID: [name], male    DOB: [birth year]  Age: 64 y.o. MRN: [id number]  CC:  Chief Complaint  Patient presents with  . Follow-up    hospital follow up    HPI [name] presents for hospital discharge follow-up where he been treated for hypotension, dehydration and acute kidney injury.  Chlorthalidone and lisinopril were discontinued and he was started on amlodipine.  Tolerating this drug well.  He does have existing lower extremity edema that is being treated with compression stockings.  Seen by endocrinology, basal insulin was recommended but it was decided that he continue his 70/30 insulin.  Past Medical History:  Diagnosis Date  . Arthritis   . Diabetes mellitus without complication ([place])   . History of pulmonary embolus (PE)   . Hypertension   . Kidney stones   . Lipidemia   . Prostate enlargement     No past surgical history on file.  Family History  Problem Relation Age of Onset  . Cancer Mother   . Heart disease Father     Social History   Socioeconomic History  . Marital status: Significant Other    Spouse name: Not on file  . Number of children: Not on file  . Years of education: Not on file  . Highest education level: Not on file  Occupational History  . Occupation: retired  Tobacco Use  . Smoking status: Never Smoker  . Smokeless tobacco: Never Used  Substance and Sexual Activity  . Alcohol use: Yes    Alcohol/week: 1.0 standard drinks    Types: 1 Cans of beer per week    Comment: social  . Drug use: Never  . Sexual activity: Yes  Other Topics Concern  . Not on file  Social History Narrative  . Not on file   Social Determinants of Health   Financial Resource Strain:   . Difficulty of Paying Living Expenses:   Food Insecurity:   . Worried About Charity fundraiser in the Last Year:   . Arboriculturist in the Last Year:   Transportation Needs:   . Film/video editor (Medical):    [name] Lack of Transportation (Non-Medical):   Physical Activity:   . Days of Exercise per Week:   . Minutes of Exercise per Session:   Stress:   . Feeling of Stress :   Social Connections:   . Frequency of Communication with Friends and Family:   . Frequency of Social Gatherings with Friends and Family:   . Attends Religious Services:   . Active Member of Clubs or Organizations:   . Attends Archivist Meetings:   [name] Marital Status:   Intimate Partner Violence:   . Fear of Current or Ex-Partner:   . Emotionally Abused:   [name] Physically Abused:   . Sexually Abused:     Outpatient Medications Prior to Visit  Medication Sig Dispense Refill  . acetaminophen (TYLENOL 8 HOUR ARTHRITIS PAIN) 650 MG CR tablet Take 650 mg by mouth every 8 (eight) hours as needed for pain.    [name] amLODipine (NORVASC) 10 MG tablet Take 1 tablet (10 mg total) by mouth daily. 90 tablet 3  . aspirin EC 81 MG tablet Take 81 mg by mouth daily.    . Blood Glucose Monitoring Suppl (ACCU-CHEK AVIVA) device by Other route. Use as instructed    . cilostazol (PLETAL) 100 MG tablet Take 100 mg by mouth 2 (two)  times daily.    [name] gabapentin (NEURONTIN) 300 MG capsule Take 300 mg by mouth 3 (three) times daily.    [name] glucose blood (ACCU-CHEK GUIDE) test strip Before breakfast and supper 100 each 12  . insulin NPH-regular Human (HUMULIN 70/30) (70-30) 100 UNIT/ML injection Inject 52 Units into the skin 2 (two) times daily with a meal. 40 mL 3  . Insulin Syringes, Disposable, U-100 1 ML MISC 1 Device by Does not apply route as directed. 100 each 11  . lovastatin (MEVACOR) 40 MG tablet Take 40 mg by mouth at bedtime.    . traMADol (ULTRAM) 50 MG tablet Take 50 mg by mouth every 6 (six) hours as needed for moderate pain.     [name] warfarin (COUMADIN) 2 MG tablet Take 2 mg by mouth See admin instructions. Take 2mg  tablet along with the 6mg  tablet daily for total of 8mg  except on Wednesday and fridays take 5mg  tablet per patient.    .  warfarin (COUMADIN) 5 MG tablet Take 1 tablet (5 mg total) by mouth daily. Take as directed. (Patient taking differently: Take 5 mg by mouth daily. Take 5mg  tablet by mouth on Wednesdays and Fridays, then take a 8mg  tablet by mouth rest of days per patient) 30 tablet 2  . warfarin (COUMADIN) 6 MG tablet Take 6 mg by mouth See admin instructions. Take 6mg  tablet along with the 2mg  tablet daily for total of 8mg  except on Wednesday and fridays take 5mg  tablet per patient.     No facility-administered medications prior to visit.    Allergies  Allergen Reactions  . Lisinopril Other (See Comments)    Patient states that this medication makes him feel "off".     ROS Review of Systems  Constitutional: Negative.   HENT: Negative.   Eyes: Negative for photophobia and visual disturbance.  Respiratory: Negative.   Cardiovascular: Negative.   Gastrointestinal: Negative.   Endocrine: Negative for polyphagia and polyuria.  Genitourinary: Negative.   Musculoskeletal: Negative for gait problem and joint swelling.  Skin: Negative for pallor and rash.  Neurological: Negative for speech difficulty and light-headedness.  Hematological: Does not bruise/bleed easily.  Psychiatric/Behavioral: Negative.       Objective:    Physical Exam  Constitutional: He is oriented to person, place, and time. He appears well-developed and well-nourished. No distress.  HENT:  Head: Normocephalic and atraumatic.  Right Ear: External ear normal.  Left Ear: External ear normal.  Eyes: Conjunctivae are normal. Right eye exhibits no discharge. Left eye exhibits no discharge. No scleral icterus.  Neck: No JVD present. No tracheal deviation present. No thyromegaly present.  Cardiovascular: Normal rate, regular rhythm and normal heart sounds.  Pulmonary/Chest: Effort normal and breath sounds normal. No stridor.  Abdominal: Bowel sounds are normal.  Musculoskeletal:        General: Edema present.  Lymphadenopathy:     He has no cervical adenopathy.  Neurological: He is alert and oriented to person, place, and time.  Skin: Skin is warm and dry. He is not diaphoretic.  Psychiatric: His behavior is normal.    BP (!) 150/72   Pulse 94   Temp (!) 97 F (36.1 C) (Tympanic)   Ht 6\' 2"  (1.88 m)   Wt 244 lb 6.4 oz (110.9 kg)   SpO2 100%   BMI 31.38 kg/m  Wt Readings from Last 3 Encounters:  02/25/20 244 lb 6.4 oz (110.9 kg)  02/23/20 246 lb 6.4 oz (111.8 kg)  02/18/20 243 lb 12.8 oz (  110.6 kg)     Health Maintenance Due  Topic Date Due  . Hepatitis C Screening  Never done  . OPHTHALMOLOGY EXAM  Never done  . TETANUS/TDAP  Never done  . COLONOSCOPY  Never done  . PNA vac Low Risk Adult (1 of 2 - PCV13) Never done    There are no preventive care reminders to display for this patient.  Lab Results  Component Value Date   TSH 1.344 02/09/2020   Lab Results  Component Value Date   WBC 8.7 02/09/2020   HGB 11.6 (L) 02/09/2020   HCT 36.4 (L) 02/09/2020   MCV 92.6 02/09/2020   PLT 177 02/09/2020   Lab Results  Component Value Date   NA 139 02/09/2020   K 4.8 02/09/2020   CO2 19 (L) 02/09/2020   GLUCOSE 191 (H) 02/09/2020   BUN 36 (H) 02/09/2020   CREATININE 2.10 (H) 02/09/2020   BILITOT 0.3 02/09/2020   ALKPHOS 70 02/09/2020   AST 12 (L) 02/09/2020   ALT 18 02/09/2020   PROT 6.7 02/09/2020   ALBUMIN 3.6 02/09/2020   CALCIUM 8.5 (L) 02/09/2020   ANIONGAP 9 02/09/2020   GFR 39.76 (L) 01/26/2020   Lab Results  Component Value Date   CHOL 156 12/22/2019   Lab Results  Component Value Date   HDL 41.40 12/22/2019   No results found for: [facility] Lab Results  Component Value Date   TRIG 251.0 (H) 12/22/2019   Lab Results  Component Value Date   CHOLHDL 4 12/22/2019   Lab Results  Component Value Date   HGBA1C 8.6 (H) 02/09/2020      Assessment & Plan:   Problem List Items Addressed This Visit      Cardiovascular and Mediastinum   Essential hypertension - Primary    Relevant Medications   losartan (COZAAR) 50 MG tablet   Other Relevant Orders   Basic metabolic panel     Genitourinary   CKD (chronic kidney disease) stage 4, GFR 15-29 ml/min (HCC)   Relevant Medications   losartan (COZAAR) 50 MG tablet   Other Relevant Orders   Basic metabolic panel     Other   Hospital discharge follow-up      Meds ordered this encounter  Medications  . losartan (COZAAR) 50 MG tablet    Sig: Take 1 tablet (50 mg total) by mouth daily.    Dispense:  90 tablet    Refill:  0    Follow-up: Return in about 1 month (around 03/26/2020), or return for inr check on the 29th and in one month..  Will add losartan today for improved blood pressure control and for CKD.  INR check on the 29th follow-up to see me in 1 month.  [name], MD

## 2020-03-02 ENCOUNTER — Other Ambulatory Visit: Payer: Medicare Other

## 2020-03-02 ENCOUNTER — Ambulatory Visit (INDEPENDENT_AMBULATORY_CARE_PROVIDER_SITE_OTHER): Payer: Medicare Other | Admitting: Behavioral Health

## 2020-03-02 ENCOUNTER — Other Ambulatory Visit: Payer: Self-pay

## 2020-03-02 DIAGNOSIS — Z7901 Long term (current) use of anticoagulants: Secondary | ICD-10-CM

## 2020-03-02 DIAGNOSIS — Z86711 Personal history of pulmonary embolism: Secondary | ICD-10-CM | POA: Diagnosis not present

## 2020-03-02 LAB — PROTIME-INR
INR: 3.7 ratio — ABNORMAL HIGH (ref 0.8–1.0)
Prothrombin Time: 40.6 s — ABNORMAL HIGH (ref 9.6–13.1)

## 2020-03-02 LAB — POCT INR: INR: 5 — AB (ref 2.0–3.0)

## 2020-03-02 NOTE — Progress Notes (Signed)
Patient presents in office for INR recheck. He verbalized adherence with current medication regimen. Patient reported no changes with medications or diet. All findings were negative. INR reading during visit was 5.0.  Per Dr. Ethelene Hal: Hold Coumadin dose for today. Complete PT/INR lab draw. Will contact patient with the results and further instructions.  Informed patient of the provider's recommendations. He voiced understanding and did not have any further questions or concerns prior to leaving the nurse visit.

## 2020-03-02 NOTE — Patient Instructions (Signed)
Per Dr. Ethelene Hal: Hold Coumadin dose for today. Complete PT/INR lab draw. Will contact patient with the results and further instructions.

## 2020-03-20 ENCOUNTER — Other Ambulatory Visit: Payer: Self-pay

## 2020-03-21 ENCOUNTER — Ambulatory Visit (INDEPENDENT_AMBULATORY_CARE_PROVIDER_SITE_OTHER): Payer: Medicare Other | Admitting: Behavioral Health

## 2020-03-21 ENCOUNTER — Other Ambulatory Visit: Payer: Self-pay | Admitting: Family Medicine

## 2020-03-21 DIAGNOSIS — Z86711 Personal history of pulmonary embolism: Secondary | ICD-10-CM | POA: Diagnosis not present

## 2020-03-21 DIAGNOSIS — Z7901 Long term (current) use of anticoagulants: Secondary | ICD-10-CM | POA: Diagnosis not present

## 2020-03-21 LAB — POCT INR: INR: 1.7 — AB (ref 2.0–3.0)

## 2020-03-21 NOTE — Progress Notes (Addendum)
Patient presents in office today for INR recheck. He voices adherence with current medication regimen. Patient reported no changes with diet or medications. All findings were negative. INR reading during visit was 1.7.  Per Dr. Ethelene Hal: Take Coumadin 8 MG on Monday, Wednesday & Saturday. All other days take Coumadin 5 MG. Recheck INR in 2 weeks.  Informed patient of the provider's recommendations. He verbalized understanding and did not have any further questions or concerns.  Next appointment has been scheduled for 04/04/20 at 10:00 AM.  Agreed.

## 2020-03-21 NOTE — Patient Instructions (Signed)
Per Dr. Ethelene Hal: Take Coumadin 8 MG on Monday, Wednesday & Saturday. All other days take Coumadin 5 MG. Recheck INR in 2 weeks.

## 2020-03-29 ENCOUNTER — Other Ambulatory Visit: Payer: Self-pay

## 2020-03-30 ENCOUNTER — Encounter: Payer: Self-pay | Admitting: Family Medicine

## 2020-03-30 ENCOUNTER — Ambulatory Visit (INDEPENDENT_AMBULATORY_CARE_PROVIDER_SITE_OTHER): Payer: Medicare Other | Admitting: Family Medicine

## 2020-03-30 VITALS — BP 130/70 | HR 95 | Temp 98.1°F | Ht 74.0 in | Wt 252.0 lb

## 2020-03-30 DIAGNOSIS — M545 Low back pain, unspecified: Secondary | ICD-10-CM | POA: Insufficient documentation

## 2020-03-30 DIAGNOSIS — M1712 Unilateral primary osteoarthritis, left knee: Secondary | ICD-10-CM | POA: Diagnosis not present

## 2020-03-30 DIAGNOSIS — I10 Essential (primary) hypertension: Secondary | ICD-10-CM | POA: Diagnosis not present

## 2020-03-30 DIAGNOSIS — H539 Unspecified visual disturbance: Secondary | ICD-10-CM | POA: Insufficient documentation

## 2020-03-30 DIAGNOSIS — N1832 Chronic kidney disease, stage 3b: Secondary | ICD-10-CM | POA: Diagnosis not present

## 2020-03-30 DIAGNOSIS — R0683 Snoring: Secondary | ICD-10-CM

## 2020-03-30 DIAGNOSIS — M25562 Pain in left knee: Secondary | ICD-10-CM | POA: Insufficient documentation

## 2020-03-30 DIAGNOSIS — G8929 Other chronic pain: Secondary | ICD-10-CM

## 2020-03-30 LAB — BASIC METABOLIC PANEL
BUN: 34 mg/dL — ABNORMAL HIGH (ref 6–23)
CO2: 22 mEq/L (ref 19–32)
Calcium: 8.8 mg/dL (ref 8.4–10.5)
Chloride: 105 mEq/L (ref 96–112)
Creatinine, Ser: 1.95 mg/dL — ABNORMAL HIGH (ref 0.40–1.50)
GFR: 34.61 mL/min — ABNORMAL LOW (ref >60.00)
Glucose, Bld: 246 mg/dL — ABNORMAL HIGH (ref 70–99)
Potassium: 4.7 mEq/L (ref 3.5–5.1)
Sodium: 133 mEq/L — ABNORMAL LOW (ref 135–145)

## 2020-03-30 MED ORDER — TRAMADOL HCL 50 MG PO TABS: 50.0000 mg | ORAL_TABLET | Freq: Four times a day (QID) | ORAL | 1 refills | Status: DC | PRN

## 2020-03-30 MED ORDER — METHOCARBAMOL 500 MG PO TABS: 500.0000 mg | ORAL_TABLET | Freq: Three times a day (TID) | ORAL | 0 refills | Status: DC | PRN

## 2020-03-30 NOTE — Progress Notes (Signed)
Established Patient Office Visit  Subjective:  Patient ID: [name], male    DOB: 1956-10-03  Age: 64 y.o. MRN: [id number]  CC:  Chief Complaint  Patient presents with  . Follow-up    Pt here for 1 month Bp follow up.  Pt would like to discuss medications, he would like a paper script for the Tramadol.  Pt c/o of lt knee pain    HPI [name] presents for follow-up of his hypertension, CKD, left knee pain and acute visual disturbance.  Blood pressures been controlled trolled with the Norvasc and losartan.  CKD is improved to stage IV.  Ongoing history of left knee pain.  Probably needs replacement.  Was hoping to get another round of Synvisc injections.  Knee pain seem to do better with the Indocin but he realizes that he cannot take it anymore.  He has been experiencing some muscle spasms in his back.  Experienced a strange visual disturbance.  When he looked at anything white he lost any enclosed architectural detail he tells me.  Would like to be evaluated for sleep apnea  Past Medical History:  Diagnosis Date  . Arthritis   . Diabetes mellitus without complication ([name])   . History of pulmonary embolus (PE)   . Hypertension   . Kidney stones   . Lipidemia   . Prostate enlargement     History reviewed. No pertinent surgical history.  Family History  Problem Relation Age of Onset  . Cancer Mother   . Heart disease Father     Social History   Socioeconomic History  . Marital status: Significant Other    Spouse name: Not on file  . Number of children: Not on file  . Years of education: Not on file  . Highest education level: Not on file  Occupational History  . Occupation: retired  Tobacco Use  . Smoking status: Never Smoker  . Smokeless tobacco: Never Used  Substance and Sexual Activity  . Alcohol use: Yes    Alcohol/week: 1.0 standard drinks    Types: 1 Cans of beer per week    Comment: social  . Drug use: Never  . Sexual activity: Yes  Other Topics  Concern  . Not on file  Social History Narrative  . Not on file   Social Determinants of Health   Financial Resource Strain:   . Difficulty of Paying Living Expenses:   Food Insecurity:   . Worried About Charity fundraiser in the Last Year:   . Arboriculturist in the Last Year:   Transportation Needs:   . Film/video editor (Medical):   [name] Lack of Transportation (Non-Medical):   Physical Activity:   . Days of Exercise per Week:   . Minutes of Exercise per Session:   Stress:   . Feeling of Stress :   Social Connections:   . Frequency of Communication with Friends and Family:   . Frequency of Social Gatherings with Friends and Family:   . Attends Religious Services:   . Active Member of Clubs or Organizations:   . Attends Archivist Meetings:   [name] Marital Status:   Intimate Partner Violence:   . Fear of Current or Ex-Partner:   . Emotionally Abused:   [name] Physically Abused:   . Sexually Abused:     Outpatient Medications Prior to Visit  Medication Sig Dispense Refill  . acetaminophen (TYLENOL 8 HOUR ARTHRITIS PAIN) 650 MG CR tablet Take 650 mg by  mouth every 8 (eight) hours as needed for pain.    [name] amLODipine (NORVASC) 10 MG tablet Take 1 tablet (10 mg total) by mouth daily. 90 tablet 3  . aspirin EC 81 MG tablet Take 81 mg by mouth daily.    . Blood Glucose Monitoring Suppl (ACCU-CHEK AVIVA) device by Other route. Use as instructed    . cilostazol (PLETAL) 100 MG tablet Take 100 mg by mouth 2 (two) times daily.    [name] gabapentin (NEURONTIN) 300 MG capsule Take 300 mg by mouth 3 (three) times daily.    [name] glucose blood (ACCU-CHEK GUIDE) test strip Before breakfast and supper 100 each 12  . insulin NPH-regular Human (HUMULIN 70/30) (70-30) 100 UNIT/ML injection Inject 52 Units into the skin 2 (two) times daily with a meal. 40 mL 3  . Insulin Syringes, Disposable, U-100 1 ML MISC 1 Device by Does not apply route as directed. 100 each 11  . losartan (COZAAR) 50 MG  tablet Take 1 tablet (50 mg total) by mouth daily. 90 tablet 0  . lovastatin (MEVACOR) 40 MG tablet Take 40 mg by mouth at bedtime.    [name] warfarin (COUMADIN) 2 MG tablet Take 2 mg by mouth See admin instructions. Take 2mg  tablet along with the 6mg  tablet daily for total of 8mg  except on Wednesday and fridays take 5mg  tablet per patient.    . warfarin (COUMADIN) 5 MG tablet TAKE 1 TABLET (5 MG TOTAL) BY MOUTH DAILY. TAKE AS DIRECTED. 90 tablet 2  . warfarin (COUMADIN) 6 MG tablet Take 6 mg by mouth See admin instructions. Take 6mg  tablet along with the 2mg  tablet daily for total of 8mg  except on Wednesday and fridays take 5mg  tablet per patient.    . traMADol (ULTRAM) 50 MG tablet Take 50 mg by mouth every 6 (six) hours as needed for moderate pain.      No facility-administered medications prior to visit.    Allergies  Allergen Reactions  . Lisinopril Other (See Comments)    Patient states that this medication makes him feel "off".     ROS Review of Systems  Constitutional: Negative.   HENT: Negative.   Eyes: Negative for photophobia.  Respiratory: Negative.   Cardiovascular: Negative.   Gastrointestinal: Negative.   Endocrine: Negative for polyphagia and polyuria.  Genitourinary: Negative.   Musculoskeletal: Positive for arthralgias.  Skin: Negative for pallor and rash.  Allergic/Immunologic: Negative for immunocompromised state.  Neurological: Negative for tremors and speech difficulty.  Hematological: Does not bruise/bleed easily.  Psychiatric/Behavioral: Negative.       Objective:    Physical Exam  Constitutional: He is oriented to person, place, and time. He appears well-developed and well-nourished. No distress.  HENT:  Head: Normocephalic and atraumatic.  Right Ear: External ear normal.  Left Ear: External ear normal.  Eyes: Conjunctivae are normal. Right eye exhibits no discharge. Left eye exhibits no discharge. No scleral icterus.  Neck: No JVD present. No tracheal  deviation present.  Cardiovascular: Normal rate, regular rhythm and normal heart sounds.  Pulmonary/Chest: Effort normal and breath sounds normal. No stridor.  Neurological: He is alert and oriented to person, place, and time.  Skin: Skin is warm and dry. He is not diaphoretic.  Psychiatric: He has a normal mood and affect. His behavior is normal.    Pulse 95   Temp 98.1 F (36.7 C) (Temporal)   Ht 6\' 2"  (1.88 m)   Wt 252 lb (114.3 kg)   SpO2 97%  BMI 32.35 kg/m  Wt Readings from Last 3 Encounters:  03/30/20 252 lb (114.3 kg)  02/25/20 244 lb 6.4 oz (110.9 kg)  02/23/20 246 lb 6.4 oz (111.8 kg)     Health Maintenance Due  Topic Date Due  . Hepatitis C Screening  Never done  . OPHTHALMOLOGY EXAM  Never done  . TETANUS/TDAP  Never done  . COLONOSCOPY  Never done  . PNA vac Low Risk Adult (1 of 2 - PCV13) Never done    There are no preventive care reminders to display for this patient.  Lab Results  Component Value Date   TSH 1.344 02/09/2020   Lab Results  Component Value Date   WBC 8.7 02/09/2020   HGB 11.6 (L) 02/09/2020   HCT 36.4 (L) 02/09/2020   MCV 92.6 02/09/2020   PLT 177 02/09/2020   Lab Results  Component Value Date   NA 138 02/25/2020   K 4.4 02/25/2020   CO2 25 02/25/2020   GLUCOSE 162 (H) 02/25/2020   BUN 24 (H) 02/25/2020   CREATININE 1.70 (H) 02/25/2020   BILITOT 0.3 02/09/2020   ALKPHOS 70 02/09/2020   AST 12 (L) 02/09/2020   ALT 18 02/09/2020   PROT 6.7 02/09/2020   ALBUMIN 3.6 02/09/2020   CALCIUM 9.0 02/25/2020   ANIONGAP 9 02/09/2020   GFR 40.56 (L) 02/25/2020   Lab Results  Component Value Date   CHOL 156 12/22/2019   Lab Results  Component Value Date   HDL 41.40 12/22/2019   No results found for: [facility] Lab Results  Component Value Date   TRIG 251.0 (H) 12/22/2019   Lab Results  Component Value Date   CHOLHDL 4 12/22/2019   Lab Results  Component Value Date   HGBA1C 8.6 (H) 02/09/2020      Assessment & Plan:     Problem List Items Addressed This Visit      Cardiovascular and Mediastinum   Essential hypertension - Primary   Relevant Orders   Basic metabolic panel     Musculoskeletal and Integument   Arthritis of left knee   Relevant Medications   traMADol (ULTRAM) 50 MG tablet   methocarbamol (ROBAXIN) 500 MG tablet   Other Relevant Orders   Ambulatory referral to Sports Medicine     Genitourinary   Chronic kidney disease, stage 3b   Relevant Orders   Basic metabolic panel     Other   Visual disturbance   Relevant Orders   Ambulatory referral to Ophthalmology   Chronic pain of left knee   Relevant Medications   traMADol (ULTRAM) 50 MG tablet   methocarbamol (ROBAXIN) 500 MG tablet   Other Relevant Orders   Ambulatory referral to Sports Medicine   Snores   Relevant Orders   Ambulatory referral to Sleep Studies   Midline low back pain without sciatica   Relevant Medications   traMADol (ULTRAM) 50 MG tablet   methocarbamol (ROBAXIN) 500 MG tablet      Meds ordered this encounter  Medications  . traMADol (ULTRAM) 50 MG tablet    Sig: Take 1 tablet (50 mg total) by mouth every 6 (six) hours as needed for moderate pain.    Dispense:  30 tablet    Refill:  1  . methocarbamol (ROBAXIN) 500 MG tablet    Sig: Take 1 tablet (500 mg total) by mouth every 8 (eight) hours as needed for muscle spasms.    Dispense:  30 tablet    Refill:  0  Follow-up: Return in about 3 months (around 06/30/2020).    [name], MD

## 2020-04-04 ENCOUNTER — Ambulatory Visit (INDEPENDENT_AMBULATORY_CARE_PROVIDER_SITE_OTHER): Payer: Medicare Other | Admitting: Behavioral Health

## 2020-04-04 ENCOUNTER — Other Ambulatory Visit: Payer: Self-pay

## 2020-04-04 ENCOUNTER — Other Ambulatory Visit: Payer: Medicare Other

## 2020-04-04 DIAGNOSIS — Z7901 Long term (current) use of anticoagulants: Secondary | ICD-10-CM | POA: Diagnosis not present

## 2020-04-04 DIAGNOSIS — Z86711 Personal history of pulmonary embolism: Secondary | ICD-10-CM | POA: Diagnosis not present

## 2020-04-04 LAB — POCT INR: INR: 1.7 — AB (ref 2.0–3.0)

## 2020-04-04 LAB — PROTIME-INR
INR: 1.5 ratio — ABNORMAL HIGH (ref 0.8–1.0)
Prothrombin Time: 16.2 s — ABNORMAL HIGH (ref 9.6–13.1)

## 2020-04-04 NOTE — Progress Notes (Signed)
PT/INR sent to [facility]/thx dmf

## 2020-04-04 NOTE — Progress Notes (Signed)
Patient presents in clinic today for INR recheck. He voices adherence with current medication regimen. All patient findings were negative. He reported no changes with diet or other medications. INR reading during visit was 1.7.  Lab draw orders placed, in addition to the POCT INR for more accurate reading.  Informed patient that once results are obtained, PCP will provide recommendations.  Patient verbalized understanding and did not have further questions or concerns prior to leaving the nurse visit.

## 2020-04-04 NOTE — Patient Instructions (Addendum)
Informed patient that once results are obtained, PCP will provide recommendations.

## 2020-04-07 ENCOUNTER — Other Ambulatory Visit: Payer: Self-pay

## 2020-04-07 MED ORDER — LOVASTATIN 40 MG PO TABS: 40.0000 mg | ORAL_TABLET | Freq: Every day | ORAL | 2 refills | Status: DC

## 2020-04-10 ENCOUNTER — Ambulatory Visit (INDEPENDENT_AMBULATORY_CARE_PROVIDER_SITE_OTHER): Payer: Medicare Other | Admitting: Family Medicine

## 2020-04-10 ENCOUNTER — Encounter: Payer: Self-pay | Admitting: Family Medicine

## 2020-04-10 ENCOUNTER — Ambulatory Visit: Payer: Self-pay

## 2020-04-10 ENCOUNTER — Ambulatory Visit (INDEPENDENT_AMBULATORY_CARE_PROVIDER_SITE_OTHER): Payer: Medicare Other

## 2020-04-10 ENCOUNTER — Other Ambulatory Visit: Payer: Self-pay

## 2020-04-10 VITALS — BP 120/80 | HR 103 | Ht 74.0 in | Wt 249.8 lb

## 2020-04-10 DIAGNOSIS — M25562 Pain in left knee: Secondary | ICD-10-CM | POA: Diagnosis not present

## 2020-04-10 DIAGNOSIS — M1712 Unilateral primary osteoarthritis, left knee: Secondary | ICD-10-CM

## 2020-04-10 DIAGNOSIS — G8929 Other chronic pain: Secondary | ICD-10-CM

## 2020-04-10 NOTE — Progress Notes (Signed)
    Subjective:    CC: L knee pain  I, Molly Weber, LAT, ATC, am serving as scribe for Dr. Lynne Leader.  HPI: Pt is a 64 y/o male presenting w/ c/o chronic L knee pain.  He locates his pain to his medial knee.  He rates his pain as minimal to severe at it's worst and describes his pain as constant w/ intermittent stabbing pain .  He initially injured his L knee about 20 years ago and then injured his L knee again 10 years ago after suffering a pulmonary embolism.  Additionally he has diabetes that is marginally controlled.  A1c 8.6 recently.  Radiating pain: yes into his L lower knee L knee swelling: yes L knee mechanical symptoms: Rarely Aggravating factors: walking; L LE weight-bearing Treatments tried: Synvisc in the past; Tramadol; Methacarbamol; prescription NSAIDs; Tramadol  Pertinent review of Systems: No fevers or chills  Relevant historical information: Type 2 diabetes, CKD stage 3, history of PE on warfarin   Objective:    Vitals:   04/10/20 1117  BP: 120/80  Pulse: (!) 103  SpO2: (!) 7%   General: Well Developed, well nourished, and in no acute distress.   MSK:  Left knee no significant effusion. Range of motion 5-120 degrees with crepitation. Nontender. Stable ligamentous exam.  Lab and Radiology Results X-ray images left knee obtained today personally and independently reviewed Severe medial compartment DJD.  Moderate  patellofemoral DJD.  Lateral compartment reasonably preserved.  No acute fractures. Await formal radiology review   Impression and Recommendations:    Assessment and Plan: 64 y.o. male with chronic left knee pain due to arthritis.  Patient has had moderate benefit of hyaluronic acid injections a few years ago.  This is reasonable to proceed with again.  Work on authorization for hyaluronic acid.  Would like to avoid steroids as I do not think they are going to be very helpful and will increase his blood sugar further.  Also recommend  topical diclofenac gel.  Additionally recommend quad strengthening and weight loss.  Recheck back in near future once hyaluronic acid is approved.  Additionally could consider mild rheumatologic work-up in the future to address his other arthralgia.  Discussed this with patient he like to defer it for now..   Orders Placed This Encounter  Procedures  . DG Knee AP/LAT W/Sun[place] Left    Standing Status:   Future    Number of Occurrences:   1    Standing Expiration Date:   04/10/2021    Order Specific Question:   Reason for Exam (SYMPTOM  OR DIAGNOSIS REQUIRED)    Answer:   eval knee pain    Order Specific Question:   Preferred imaging location?    Answer:   [facility]    Order Specific Question:   Radiology Contrast Protocol - do NOT remove file path    Answer:   [email or web address]   No orders of the defined types were placed in this encounter.   Discussed warning signs or symptoms. Please see discharge instructions. Patient expresses understanding.   The above documentation has been reviewed and is accurate and complete Lynne Leader, M.D.

## 2020-04-10 NOTE — Patient Instructions (Addendum)
Thank you for coming in today. Plan for xray today.  We will work on authorizing the gel shots.  I will see you back once approved.  Let me know if you do not hear anything about the shots soon.  Plan for weight loss and quad strength.  Ok to try voltaren gel as well.   I can do a rhuem workup if needed.   Sodium Hyaluronate intra-articular injection What is this medicine? SODIUM HYALURONATE (SOE dee um hye al yoor ON ate) is used to treat pain in the knee due to osteoarthritis. This medicine may be used for other purposes; ask your health care provider or pharmacist if you have questions. COMMON BRAND NAME(S): Amvisc, DUROLANE, Euflexxa, GELSYN-3, Hyalgan, Hymovis, Monovisc, Orthovisc, Supartz, Supartz FX, TriVisc, VISCO What should I tell my health care provider before I take this medicine? They need to know if you have any of these conditions:  bleeding disorders  glaucoma  infection in the knee joint  skin conditions or sensitivity  skin infection  an unusual allergic reaction to sodium hyaluronate, other medicines, foods, dyes, or preservatives. Different brands of sodium hyaluronate contain different allergens. Some may contain egg. Talk to your doctor about your allergies to make sure that you get the right product.  pregnant or trying to get pregnant  breast-feeding How should I use this medicine? This medicine is for injection into the knee joint. It is given by a health care professional in a hospital or clinic setting. Talk to your pediatrician regarding the use of this medicine in children. Special care may be needed. Overdosage: If you think you have taken too much of this medicine contact a poison control center or emergency room at once. NOTE: This medicine is only for you. Do not share this medicine with others. What if I miss a dose? This does not apply. What may interact with this medicine? Interactions are not expected. This list may not describe all  possible interactions. Give your health care provider a list of all the medicines, herbs, non-prescription drugs, or dietary supplements you use. Also tell them if you smoke, drink alcohol, or use illegal drugs. Some items may interact with your medicine. What should I watch for while using this medicine? Tell your doctor or healthcare professional if your symptoms do not start to get better or if they get worse. If receiving this medicine for osteoarthritis, limit your activity after you receive your injection. Avoid physical activity for 48 hours following your injection to keep your knee from swelling. Do not stand on your feet for more than 1 hour at a time during the first 48 hours following your injection. Ask your doctor or healthcare professional about when you can begin major physical activity again. What side effects may I notice from receiving this medicine? Side effects that you should report to your doctor or health care professional as soon as possible:  allergic reactions like skin rash, itching or hives, swelling of the face, lips, or tongue  dizziness  facial flushing  pain, tingling, numbness in the hands or feet  vision changes if received this medicine during eye surgery Side effects that usually do not require medical attention (report to your doctor or health care professional if they continue or are bothersome):  back pain  bruising at site where injected  chills  diarrhea  fever  headache  joint pain  joint stiffness  joint swelling  muscle cramps  muscle pain  nausea, vomiting  pain, redness, or  irritation at site where injected  weak or tired This list may not describe all possible side effects. Call your doctor for medical advice about side effects. You may report side effects to FDA at 1-800-FDA-1088. Where should I keep my medicine? This drug is given in a hospital or clinic and will not be stored at home. NOTE: This sheet is a summary. It  may not cover all possible information. If you have questions about this medicine, talk to your doctor, pharmacist, or health care provider.  2020 Elsevier/Gold Standard (2015-11-23 08:34:51)

## 2020-04-11 NOTE — Progress Notes (Signed)
Left knee xray shows medium or severe arthritis in the medial compartment.

## 2020-04-19 ENCOUNTER — Ambulatory Visit (INDEPENDENT_AMBULATORY_CARE_PROVIDER_SITE_OTHER): Payer: Medicare Other | Admitting: Behavioral Health

## 2020-04-19 ENCOUNTER — Other Ambulatory Visit: Payer: Self-pay

## 2020-04-19 DIAGNOSIS — Z7901 Long term (current) use of anticoagulants: Secondary | ICD-10-CM | POA: Diagnosis not present

## 2020-04-19 DIAGNOSIS — Z86711 Personal history of pulmonary embolism: Secondary | ICD-10-CM | POA: Diagnosis not present

## 2020-04-19 LAB — POCT INR: INR: 4.3 — AB (ref 2.0–3.0)

## 2020-04-19 NOTE — Progress Notes (Signed)
Patient presents in office for INR check. He verbalized adherence with current medication regimen. Patient reported no changes with diet or other medications. All findings were negative. INR reading during today's visit was 4.3.  Per Dr. Ethelene Hal: Hold coumadin dose for 3 days & then resume taking Coumadin 7 mg on Monday, Wednesday and Friday. All other days take Coumadin 5 mg. Follow-up in 2 weeks for INR recheck.  Informed patient of the provider's recommendations. He voiced understanding and did not have any further questions or concerns prior to leaving the visit.  Next appointment has been scheduled for 05/04/20 at 10 AM.

## 2020-04-19 NOTE — Patient Instructions (Signed)
Per Dr. Ethelene Hal: Hold coumadin dose for 3 days & then resume taking Coumadin 7 mg on Monday, Wednesday and Friday. All other days take Coumadin 5 mg. Follow-up in 2 weeks for INR recheck.

## 2020-04-27 ENCOUNTER — Encounter: Payer: Self-pay | Admitting: Neurology

## 2020-04-27 ENCOUNTER — Other Ambulatory Visit: Payer: Self-pay

## 2020-04-27 ENCOUNTER — Ambulatory Visit (INDEPENDENT_AMBULATORY_CARE_PROVIDER_SITE_OTHER): Payer: Medicare Other | Admitting: Neurology

## 2020-04-27 VITALS — BP 130/81 | HR 97 | Ht 74.0 in | Wt 246.5 lb

## 2020-04-27 DIAGNOSIS — E669 Obesity, unspecified: Secondary | ICD-10-CM | POA: Diagnosis not present

## 2020-04-27 DIAGNOSIS — Z86711 Personal history of pulmonary embolism: Secondary | ICD-10-CM

## 2020-04-27 DIAGNOSIS — G479 Sleep disorder, unspecified: Secondary | ICD-10-CM

## 2020-04-27 DIAGNOSIS — R0600 Dyspnea, unspecified: Secondary | ICD-10-CM

## 2020-04-27 DIAGNOSIS — G4719 Other hypersomnia: Secondary | ICD-10-CM | POA: Diagnosis not present

## 2020-04-27 DIAGNOSIS — R0683 Snoring: Secondary | ICD-10-CM | POA: Diagnosis not present

## 2020-04-27 DIAGNOSIS — R0609 Other forms of dyspnea: Secondary | ICD-10-CM

## 2020-04-27 DIAGNOSIS — G4700 Insomnia, unspecified: Secondary | ICD-10-CM

## 2020-04-27 NOTE — Progress Notes (Signed)
Subjective:    Patient ID: [name] is a 64 y.o. male.  HPI     Star Age, MD, PhD [facility] [address] [address] [place], [place] [address]  Dear Dr. Ethelene Hal,   I saw your patient, [name], upon your kind request in my sleep clinic today for initial consultation of his sleep disorder, in particular, concern for underlying obstructive sleep apnea.  The patient is unaccompanied today.  As you know, [name] is a 64 year old right-handed gentleman with an underlying medical history of arthritis, diabetes, history of PE, hypertension, kidney stones, prostate enlargement, hyperlipidemia, and obesity, who reports snoring and excessive daytime somnolence.  I reviewed your office note from 03/30/2020.  His Epworth sleepiness score is 7/24, fatigue severity score is 51 out of 63.  He has fatigue and shortness of breath especially when physically active.  He has chronic knee pain and overall issues with arthritis, chronic back pain.  He takes Robaxin rarely, he takes tramadol rarely.  He is followed by sports medicine.  In April he was hospitalized for day for dehydration and a syncopal spell.  He has also seen urology recently for his kidney stone.  He lives with his girlfriend, has 2 dogs in the household, no kids.  They do have a TV in the bedroom and watches the news at night and has a TV on a timer.  Bedtime is generally around 11 and rise time between 7 and 8 AM.  He does not have night to night nocturia.  He does not typically wake up with a headache.  He denies any telltale symptoms of restless leg syndrome.  He has some trouble falling asleep and often difficulty staying asleep, this has become worse over the past 2 years.  In the past year he has gained about 15 pounds.  He likes to drink coffee and soda, coffee typically 16 ounce daily and soda several servings per day.  He rarely drinks alcohol and is a non-smoker, is retired as an Scientist, physiological.   His Past Medical History Is Significant For: Past Medical History:  Diagnosis Date  . Arthritis   . Diabetes mellitus without complication ([place])   . History of pulmonary embolus (PE)   . Hypertension   . Kidney stones   . Lipidemia   . Prostate enlargement     His Past Surgical History Is Significant For: No past surgical history on file.  His Family History Is Significant For: Family History  Problem Relation Age of Onset  . Cancer Mother   . Heart disease Father     His Social History Is Significant For: Social History   Socioeconomic History  . Marital status: Significant Other    Spouse name: Not on file  . Number of children: Not on file  . Years of education: Not on file  . Highest education level: Not on file  Occupational History  . Occupation: retired  Tobacco Use  . Smoking status: Never Smoker  . Smokeless tobacco: Never Used  Vaping Use  . Vaping Use: Never used  Substance and Sexual Activity  . Alcohol use: Yes    Alcohol/week: 1.0 standard drink    Types: 1 Cans of beer per week    Comment: social  . Drug use: Never  . Sexual activity: Yes  Other Topics Concern  . Not on file  Social History Narrative  . Not on file   Social Determinants of Health   Financial  Resource Strain:   . Difficulty of Paying Living Expenses:   Food Insecurity:   . Worried About Charity fundraiser in the Last Year:   . Arboriculturist in the Last Year:   Transportation Needs:   . Film/video editor (Medical):   [name] Lack of Transportation (Non-Medical):   Physical Activity:   . Days of Exercise per Week:   . Minutes of Exercise per Session:   Stress:   . Feeling of Stress :   Social Connections:   . Frequency of Communication with Friends and Family:   . Frequency of Social Gatherings with Friends and Family:   . Attends Religious Services:   . Active Member of Clubs or Organizations:   . Attends Archivist Meetings:   [name] Marital  Status:     His Allergies Are:  Allergies  Allergen Reactions  . Lisinopril Other (See Comments)    Patient states that this medication makes him feel "off".   :   His Current Medications Are:  Outpatient Encounter Medications as of 04/27/2020  Medication Sig  . acetaminophen (TYLENOL 8 HOUR ARTHRITIS PAIN) 650 MG CR tablet Take 650 mg by mouth every 8 (eight) hours as needed for pain.  [name] amLODipine (NORVASC) 10 MG tablet Take 1 tablet (10 mg total) by mouth daily.  [name] aspirin EC 81 MG tablet Take 81 mg by mouth daily.  . Blood Glucose Monitoring Suppl (ACCU-CHEK AVIVA) device by Other route. Use as instructed  . cilostazol (PLETAL) 100 MG tablet Take 100 mg by mouth 2 (two) times daily.  [name] gabapentin (NEURONTIN) 300 MG capsule Take 300 mg by mouth 3 (three) times daily.  [name] glucose blood (ACCU-CHEK GUIDE) test strip Before breakfast and supper  . insulin NPH-regular Human (HUMULIN 70/30) (70-30) 100 UNIT/ML injection Inject 52 Units into the skin 2 (two) times daily with a meal.  . Insulin Syringes, Disposable, U-100 1 ML MISC 1 Device by Does not apply route as directed.  [name] losartan (COZAAR) 50 MG tablet Take 1 tablet (50 mg total) by mouth daily.  [name] lovastatin (MEVACOR) 40 MG tablet Take 1 tablet (40 mg total) by mouth at bedtime.  . methocarbamol (ROBAXIN) 500 MG tablet Take 1 tablet (500 mg total) by mouth every 8 (eight) hours as needed for muscle spasms.  . traMADol (ULTRAM) 50 MG tablet Take 1 tablet (50 mg total) by mouth every 6 (six) hours as needed for moderate pain.  [name] warfarin (COUMADIN) 2 MG tablet Take 2 mg by mouth See admin instructions. Take 2mg  tablet along with the 6mg  tablet daily for total of 8mg  except on Wednesday and fridays take 5mg  tablet per patient.  . warfarin (COUMADIN) 5 MG tablet TAKE 1 TABLET (5 MG TOTAL) BY MOUTH DAILY. TAKE AS DIRECTED.  [name] warfarin (COUMADIN) 6 MG tablet Take 6 mg by mouth See admin instructions. Take 6mg  tablet along with the 2mg  tablet  daily for total of 8mg  except on Wednesday and fridays take 5mg  tablet per patient.   No facility-administered encounter medications on file as of 04/27/2020.  :  Review of Systems:  Out of a complete 14 point review of systems, all are reviewed and negative with the exception of these symptoms as listed below: Review of Systems  Neurological:       Referred by Dr. Ethelene Hal for possible OSA. Denies ever having a sleep study before. States he has been told by his S/O that he tends to stop  breathing during his sleep. Will occasionally wake up feeling fatigued.   Epworth Sleepiness Scale 0= would never doze 1= slight chance of dozing 2= moderate chance of dozing 3= high chance of dozing  Sitting and reading: 1 Watching TV: 1 Sitting inactive in a public place (ex. Theater or meeting): 1 As a passenger in a car for an hour without a break: 0 Lying down to rest in the afternoon: 3 Sitting and talking to someone: 0 Sitting quietly after lunch (no alcohol): 1 In a car, while stopped in traffic: 0 Total: 7     Objective:  Neurological Exam  Physical Exam Physical Examination:   Vitals:   04/27/20 1123  BP: 130/81  Pulse: 97    General Examination: The patient is a very pleasant 64 y.o. male in no acute distress. He appears well-developed and well-nourished and well groomed.   HEENT: Normocephalic, atraumatic, pupils are equal, round and reactive to light, extraocular tracking is good without limitation to gaze excursion or nystagmus noted. Hearing is grossly intact. Face is symmetric with normal facial animation. Speech is clear with no dysarthria noted. There is no hypophonia. There is no lip, neck/head, jaw or voice tremor. Neck is supple with full range of passive and active motion. There are no carotid bruits on auscultation. Oropharynx exam reveals: moderate mouth dryness, marginal dental hygiene and moderate airway crowding, due to longer uvula and tonsillar size of 1+,  Mallampati class II, neck circumference of 18-1/8 inches.  Tongue protrudes centrally in palate elevates symmetrically.  Chest: Clear to auscultation without wheezing, rhonchi or crackles noted.  Heart: S1+S2+0, regular and normal without murmurs, rubs or gallops noted.   Abdomen: Soft, non-tender and non-distended with normal bowel sounds appreciated on auscultation.  Extremities: There is no pitting edema in the distal lower extremities bilaterally.   Skin: Warm and dry without trophic changes noted.   Musculoskeletal: exam reveals low back pain and stiffness, left knee pain.    Neurologically:  Mental status: The patient is awake, alert and oriented in all 4 spheres. His immediate and remote memory, attention, language skills and fund of knowledge are appropriate. There is no evidence of aphasia, agnosia, apraxia or anomia. Speech is clear with normal prosody and enunciation. Thought process is linear. Mood is normal and affect is normal.  Cranial nerves II - XII are as described above under HEENT exam.  Motor exam: Normal bulk, strength and tone is noted. There is no tremor. Fine motor skills and coordination: grossly intact.  Cerebellar testing: No dysmetria or intention tremor. There is no truncal or gait ataxia.  Sensory exam: intact to light touch in the upper and lower extremities.  Gait, station and balance: He stands with difficulty.  He walks with a significant limp on the left.  No walking aid.    Assessment and Plan:   In summary, [name] is a very pleasant 64 y.o.-year old male with an underlying medical history of arthritis, diabetes, history of PE, hypertension, kidney stones, prostate enlargement, hyperlipidemia, and obesity, whose history and physical exam are concerning for obstructive sleep apnea (OSA). I had a long chat with the patient about my findings and the diagnosis of OSA, its prognosis and treatment options. We talked about medical treatments, surgical  interventions and non-pharmacological approaches. I explained in particular the risks and ramifications of untreated moderate to severe OSA, especially with respect to developing cardiovascular disease down the Road, including congestive heart failure, difficult to treat hypertension, cardiac arrhythmias, or stroke.  Even type 2 diabetes has, in part, been linked to untreated OSA. Symptoms of untreated OSA include daytime sleepiness, memory problems, mood irritability and mood disorder such as depression and anxiety, lack of energy, as well as recurrent headaches, especially morning headaches. We talked about trying to maintain a healthy lifestyle in general, as well as the importance of weight control. We also talked about the importance of good sleep hygiene. I recommended the following at this time: sleep study.  I explained the sleep test procedure to the patient and also outlined possible surgical and non-surgical treatment options of OSA, including the use of a custom-made dental device (which would require a referral to a specialist dentist or oral surgeon), upper airway surgical options, such as traditional UPPP or a novel less invasive surgical option in the form of Inspire hypoglossal nerve stimulation (which would involve a referral to an ENT surgeon). I also explained the CPAP treatment option to the patient, who indicated that he would be willing to try CPAP if the need arises. I explained the importance of being compliant with PAP treatment, not only for insurance purposes but primarily to improve His symptoms, and for the patient's long term health benefit, including to reduce His cardiovascular risks. I answered all his questions today and the patient was in agreement. I plan to see him back after the sleep study is completed and encouraged him to call with any interim questions, concerns, problems or updates.   Thank you very much for allowing me to participate in the care of this nice patient.  If I can be of any further assistance to you please do not hesitate to call me at [phone].  Sincerely,   Star Age, MD, PhD

## 2020-04-27 NOTE — Patient Instructions (Signed)

## 2020-05-02 ENCOUNTER — Telehealth: Payer: Self-pay | Admitting: Family Medicine

## 2020-05-02 ENCOUNTER — Telehealth: Payer: Self-pay | Admitting: Neurology

## 2020-05-02 LAB — HM DIABETES EYE EXAM

## 2020-05-02 NOTE — Telephone Encounter (Signed)
Called patient and informed. Appointment scheduled.

## 2020-05-02 NOTE — Telephone Encounter (Signed)
Patient has medicare now and they only approve the orthovisc or monovisc. I did orthovisc because that is waht Dr. Georgina Snell prefers for patients.

## 2020-05-02 NOTE — Telephone Encounter (Signed)
Patient called to follow up on the approval request for Synvisc. I see that Orthovisc was approved but he mentioned Synvisc.  Please advise.

## 2020-05-03 ENCOUNTER — Other Ambulatory Visit: Payer: Self-pay

## 2020-05-04 ENCOUNTER — Ambulatory Visit (INDEPENDENT_AMBULATORY_CARE_PROVIDER_SITE_OTHER): Payer: Medicare Other | Admitting: Family Medicine

## 2020-05-04 ENCOUNTER — Ambulatory Visit (INDEPENDENT_AMBULATORY_CARE_PROVIDER_SITE_OTHER): Payer: Medicare Other | Admitting: Behavioral Health

## 2020-05-04 ENCOUNTER — Ambulatory Visit: Payer: Self-pay

## 2020-05-04 DIAGNOSIS — Z86711 Personal history of pulmonary embolism: Secondary | ICD-10-CM | POA: Diagnosis not present

## 2020-05-04 DIAGNOSIS — M1712 Unilateral primary osteoarthritis, left knee: Secondary | ICD-10-CM | POA: Diagnosis not present

## 2020-05-04 DIAGNOSIS — Z7901 Long term (current) use of anticoagulants: Secondary | ICD-10-CM | POA: Diagnosis not present

## 2020-05-04 DIAGNOSIS — G8929 Other chronic pain: Secondary | ICD-10-CM | POA: Diagnosis not present

## 2020-05-04 LAB — POCT INR: INR: 2.8 (ref 2.0–3.0)

## 2020-05-04 NOTE — Progress Notes (Signed)
Patient in office for INR check. He voices adherence with current medication regimen. Patient reported no changes with diet or other medications. INR reading during today's visit was 2.8.  Per Dr. Ethelene Hal: Continue taking Coumadin 7 mg on Monday, Wednesday and Friday. All other days take Coumadin 5 mg. Follow-up in 4 weeks for INR recheck.  Informed patient of the provider's recommendations. He verbalized understanding and did not have any further questions or concerns. Next appointment has been scheduled for 06/01/20 at 10:00 AM.

## 2020-05-04 NOTE — Progress Notes (Signed)
  [name] presents to clinic today for Orthovisc injection left knee 1/4  Procedure: Real-time Ultrasound Guided Injection of left knee lateral superior patellar space Device: Philips Affiniti 50G Images permanently stored and available for review in the ultrasound unit. Verbal informed consent obtained.  Discussed risks and benefits of procedure. Warned about infection bleeding damage to structures skin hypopigmentation and fat atrophy among others. Patient expresses understanding and agreement Time-out conducted.   Noted no overlying erythema, induration, or other signs of local infection.   Skin prepped in a sterile fashion.   Local anesthesia: Topical Ethyl chloride.   With sterile technique and under real time ultrasound guidance:  Orthovisc injected easily.   Completed without difficulty     Advised to call if fevers/chills, erythema, induration, drainage, or persistent bleeding.   Images permanently stored and available for review in the ultrasound unit.  Impression: Technically successful ultrasound guided injection.  Lot number: [id number]  Return 1 week for Orthovisc injection left knee 2/4

## 2020-05-04 NOTE — Patient Instructions (Signed)
Per Dr. Ethelene Hal: Continue taking Coumadin 7 mg on Monday, Wednesday and Friday. All other days take Coumadin 5 mg. Follow-up in 4 weeks for INR recheck.

## 2020-05-04 NOTE — Telephone Encounter (Signed)
Error

## 2020-05-04 NOTE — Patient Instructions (Signed)
Thank you for coming in today. Return in 1 week for Orthovisc injection 2/4.  Go ahead and schedule the next 3 injections weekly for the next 3 weeks. (Total of 4 injections) Call or go to the ER if you develop a large red swollen joint with extreme pain or oozing puss.

## 2020-05-11 ENCOUNTER — Ambulatory Visit: Payer: Self-pay

## 2020-05-11 ENCOUNTER — Ambulatory Visit (INDEPENDENT_AMBULATORY_CARE_PROVIDER_SITE_OTHER): Payer: Medicare Other | Admitting: Family Medicine

## 2020-05-11 ENCOUNTER — Other Ambulatory Visit: Payer: Self-pay

## 2020-05-11 VITALS — Ht 74.0 in | Wt 246.0 lb

## 2020-05-11 DIAGNOSIS — G8929 Other chronic pain: Secondary | ICD-10-CM | POA: Diagnosis not present

## 2020-05-11 DIAGNOSIS — M1712 Unilateral primary osteoarthritis, left knee: Secondary | ICD-10-CM | POA: Diagnosis not present

## 2020-05-11 DIAGNOSIS — M25562 Pain in left knee: Secondary | ICD-10-CM

## 2020-05-11 NOTE — Progress Notes (Signed)
[name] presents to clinic today for Orthovisc injection left knee 2/4  Procedure: Real-time Ultrasound Guided Injection of left knee Device: Philips Affiniti 50G Images permanently stored and available for review in the ultrasound unit. Verbal informed consent obtained.  Discussed risks and benefits of procedure. Warned about infection bleeding damage to structures skin hypopigmentation and fat atrophy among others. Patient expresses understanding and agreement Time-out conducted.   Noted no overlying erythema, induration, or other signs of local infection.   Skin prepped in a sterile fashion.   Local anesthesia: Topical Ethyl chloride.   With sterile technique and under real time ultrasound guidance:  Orthovisc injected easily.   Completed without difficulty      Advised to call if fevers/chills, erythema, induration, drainage, or persistent bleeding.   Images permanently stored and available for review in the ultrasound unit.  Impression: Technically successful ultrasound guided injection.   Lot #[id number]  Recheck 1 week for Orthovisc injection left knee 3/4

## 2020-05-16 ENCOUNTER — Ambulatory Visit (INDEPENDENT_AMBULATORY_CARE_PROVIDER_SITE_OTHER): Payer: Medicare Other | Admitting: Neurology

## 2020-05-16 DIAGNOSIS — G4719 Other hypersomnia: Secondary | ICD-10-CM

## 2020-05-16 DIAGNOSIS — G4733 Obstructive sleep apnea (adult) (pediatric): Secondary | ICD-10-CM | POA: Diagnosis not present

## 2020-05-16 DIAGNOSIS — Z86711 Personal history of pulmonary embolism: Secondary | ICD-10-CM

## 2020-05-16 DIAGNOSIS — G4700 Insomnia, unspecified: Secondary | ICD-10-CM

## 2020-05-16 DIAGNOSIS — E669 Obesity, unspecified: Secondary | ICD-10-CM

## 2020-05-16 DIAGNOSIS — R0609 Other forms of dyspnea: Secondary | ICD-10-CM

## 2020-05-16 DIAGNOSIS — G4720 Circadian rhythm sleep disorder, unspecified type: Secondary | ICD-10-CM

## 2020-05-16 DIAGNOSIS — R0683 Snoring: Secondary | ICD-10-CM

## 2020-05-18 ENCOUNTER — Ambulatory Visit (INDEPENDENT_AMBULATORY_CARE_PROVIDER_SITE_OTHER): Payer: Medicare Other | Admitting: Family Medicine

## 2020-05-18 ENCOUNTER — Other Ambulatory Visit: Payer: Self-pay

## 2020-05-18 ENCOUNTER — Encounter: Payer: Self-pay | Admitting: Internal Medicine

## 2020-05-18 ENCOUNTER — Ambulatory Visit: Payer: Self-pay

## 2020-05-18 ENCOUNTER — Other Ambulatory Visit: Payer: Self-pay | Admitting: Family Medicine

## 2020-05-18 DIAGNOSIS — M25562 Pain in left knee: Secondary | ICD-10-CM

## 2020-05-18 DIAGNOSIS — M1712 Unilateral primary osteoarthritis, left knee: Secondary | ICD-10-CM

## 2020-05-18 DIAGNOSIS — G8929 Other chronic pain: Secondary | ICD-10-CM

## 2020-05-18 NOTE — Progress Notes (Signed)
Ibrahim presents to clinic today for left knee Orthovisc injection 3/4  Procedure: Real-time Ultrasound Guided Injection of left knee Device: Philips Affiniti 50G Images permanently stored and available for review in the ultrasound unit. Verbal informed consent obtained.  Discussed risks and benefits of procedure. Warned about infection bleeding damage to structures skin hypopigmentation and fat atrophy among others. Patient expresses understanding and agreement Time-out conducted.   Noted no overlying erythema, induration, or other signs of local infection.   Skin prepped in a sterile fashion.   Local anesthesia: Topical Ethyl chloride.   With sterile technique and under real time ultrasound guidance:  Orthovisc injected easily.   Completed without difficulty     Advised to call if fevers/chills, erythema, induration, drainage, or persistent bleeding.   Images permanently stored and available for review in the ultrasound unit.  Impression: Technically successful ultrasound guided injection.    Return in 1 week for left knee Orthovisc injection 4/4

## 2020-05-19 ENCOUNTER — Other Ambulatory Visit: Payer: Self-pay

## 2020-05-19 MED ORDER — ACCU-CHEK GUIDE VI STRP: ORAL_STRIP | 12 refills | Status: DC

## 2020-05-19 MED ORDER — ACCU-CHEK SOFTCLIX LANCETS MISC: 2 refills | Status: DC

## 2020-05-22 ENCOUNTER — Other Ambulatory Visit: Payer: Self-pay

## 2020-05-22 MED ORDER — ACCU-CHEK GUIDE VI STRP: ORAL_STRIP | 12 refills | Status: DC

## 2020-05-22 MED ORDER — ACCU-CHEK SOFTCLIX LANCETS MISC: 2 refills | Status: DC

## 2020-05-25 ENCOUNTER — Ambulatory Visit (INDEPENDENT_AMBULATORY_CARE_PROVIDER_SITE_OTHER): Payer: Medicare Other | Admitting: Family Medicine

## 2020-05-25 ENCOUNTER — Ambulatory Visit: Payer: Self-pay

## 2020-05-25 ENCOUNTER — Other Ambulatory Visit: Payer: Self-pay

## 2020-05-25 DIAGNOSIS — M1712 Unilateral primary osteoarthritis, left knee: Secondary | ICD-10-CM

## 2020-05-25 DIAGNOSIS — M25562 Pain in left knee: Secondary | ICD-10-CM

## 2020-05-25 DIAGNOSIS — G8929 Other chronic pain: Secondary | ICD-10-CM

## 2020-05-25 NOTE — Progress Notes (Addendum)
[name] presents to clinic for Orthovisc injection left knee 4/4  He felt pretty well after the first 2 shots but last week he has worsened a bit.  He denies any erythema or fever.  Procedure: Real-time Ultrasound Guided Injection of left knee Device: Philips Affiniti 50G Images permanently stored and available for review in the ultrasound unit. Verbal informed consent obtained.  Discussed risks and benefits of procedure. Warned about infection bleeding damage to structures skin hypopigmentation and fat atrophy among others. Patient expresses understanding and agreement Time-out conducted.   Noted no overlying erythema, induration, or other signs of local infection.   Skin prepped in a sterile fashion.   Local anesthesia: Topical Ethyl chloride.   With sterile technique and under real time ultrasound guidance:  Orthovisc injected easily.   Completed without difficulty    Advised to call if fevers/chills, erythema, induration, drainage, or persistent bleeding.   Images permanently stored and available for review in the ultrasound unit.  Impression: Technically successful ultrasound guided injection.   Lot #[id number]  Recheck as needed.  If not significant improved following Orthovisc series patient will be a good candidate for total knee replacement.  Will refer to orthopedic surgery.  Patient will notify me.

## 2020-05-25 NOTE — Patient Instructions (Signed)
Thank you for coming in today. Call or go to the ER if you develop a large red swollen joint with extreme pain or oozing puss.  If this is not helping let me know and I will refer for surgery for knee replacement.

## 2020-05-26 ENCOUNTER — Ambulatory Visit (INDEPENDENT_AMBULATORY_CARE_PROVIDER_SITE_OTHER): Payer: Medicare Other | Admitting: Internal Medicine

## 2020-05-26 ENCOUNTER — Encounter: Payer: Self-pay | Admitting: Internal Medicine

## 2020-05-26 ENCOUNTER — Other Ambulatory Visit: Payer: Self-pay

## 2020-05-26 ENCOUNTER — Other Ambulatory Visit: Payer: Self-pay | Admitting: Family Medicine

## 2020-05-26 VITALS — BP 138/80 | HR 82 | Ht 74.0 in | Wt 248.6 lb

## 2020-05-26 DIAGNOSIS — Z794 Long term (current) use of insulin: Secondary | ICD-10-CM | POA: Insufficient documentation

## 2020-05-26 DIAGNOSIS — E1165 Type 2 diabetes mellitus with hyperglycemia: Secondary | ICD-10-CM

## 2020-05-26 DIAGNOSIS — I10 Essential (primary) hypertension: Secondary | ICD-10-CM

## 2020-05-26 DIAGNOSIS — N184 Chronic kidney disease, stage 4 (severe): Secondary | ICD-10-CM

## 2020-05-26 DIAGNOSIS — E1122 Type 2 diabetes mellitus with diabetic chronic kidney disease: Secondary | ICD-10-CM | POA: Insufficient documentation

## 2020-05-26 DIAGNOSIS — E1142 Type 2 diabetes mellitus with diabetic polyneuropathy: Secondary | ICD-10-CM | POA: Diagnosis not present

## 2020-05-26 DIAGNOSIS — N1832 Chronic kidney disease, stage 3b: Secondary | ICD-10-CM

## 2020-05-26 DIAGNOSIS — E1121 Type 2 diabetes mellitus with diabetic nephropathy: Secondary | ICD-10-CM | POA: Diagnosis not present

## 2020-05-26 LAB — POCT GLYCOSYLATED HEMOGLOBIN (HGB A1C): Hemoglobin A1C: 9.2 % — AB (ref 4.0–5.6)

## 2020-05-26 MED ORDER — HUMULIN 70/30 (70-30) 100 UNIT/ML ~~LOC~~ SUSP: SUBCUTANEOUS | 3 refills | Status: DC

## 2020-05-26 NOTE — Patient Instructions (Signed)
-   Humulin Mix to 46 units Before breakfast and 56 units before supper       HOW TO TREAT LOW BLOOD SUGARS (Blood sugar LESS THAN 70 MG/DL)  Please follow the RULE OF 15 for the treatment of hypoglycemia treatment (when your (blood sugars are less than 70 mg/dL)    STEP 1: Take 15 grams of carbohydrates when your blood sugar is low, which includes:   3-4 GLUCOSE TABS  OR  3-4 OZ OF JUICE OR REGULAR SODA OR  ONE TUBE OF GLUCOSE GEL     STEP 2: RECHECK blood sugar in 15 MINUTES STEP 3: If your blood sugar is still low at the 15 minute recheck --> then, go back to STEP 1 and treat AGAIN with another 15 grams of carbohydrates.

## 2020-05-26 NOTE — Progress Notes (Signed)
Name: [name]  Age/ Sex: 64 y.o., male   MRN/ DOB: [id number], 09/22/1956     PCP: [name], MD   Reason for Endocrinology Evaluation: Type 2 Diabetes Mellitus  Initial Endocrine Consultative Visit: 02/23/2020    PATIENT IDENTIFIER: [name] is a 64 y.o. male with a past medical history of HTN, T2DM and Hx of PE. The patient has followed with Endocrinology clinic since 02/23/2020 for consultative assistance with management of his diabetes.  DIABETIC HISTORY:  Mr. Parke was diagnosed with T2DM in 2009, metformin- stopped 12/2019 due to AKI as well as Glyburide. Has been on insulin mix . His hemoglobin A1c has ranged from 7.6% ,peaking at 8.6% in 2021   On her initial visit to  [facility] her A1c was 8.6% , she was on Humulin MIx which we adjusted    Moved from [place]  ~ early 2021 SUBJECTIVE:   During the last visit (02/23/2020): A1c 8.6. Adjusted Humulin Mix   Today (05/26/2020): [name]  He checks his blood sugars 2 times daily, preprandial to breakfast and supper. The patient has had hypoglycemic episodes since the last clinic visit, which typically occur 1 x / in 3 months .  Denies nausea or diarrhea.    HOME DIABETES REGIMEN:  Humulin Mix 52 units BID      Statin: yes ACE-I/ARB: Intolerant to Lisinopril to AKI    METER DOWNLOAD SUMMARY: Did not bring  [id number]  DIABETIC COMPLICATIONS: Microvascular complications:   CKD, neuropathy  Denies: retinopathy  Last eye exam: Completed 04/2020  Macrovascular complications:    Denies: CAD, PVD, CVA    HISTORY:  Past Medical History:  Past Medical History:  Diagnosis Date  . Arthritis   . Diabetes mellitus without complication ([place])   . History of pulmonary embolus (PE)   . Hypertension   . Kidney stones   . Lipidemia   . Prostate enlargement     Past Surgical History: No past surgical history on file.  Social History:  reports that he has never smoked. He has never used  smokeless tobacco. He reports current alcohol use of about 1.0 standard drink of alcohol per week. He reports that he does not use drugs. Family History:  Family History  Problem Relation Age of Onset  . Cancer Mother   . Heart disease Father      HOME MEDICATIONS: Allergies as of 05/26/2020      Reactions   Lisinopril Other (See Comments)   Patient states that this medication makes him feel "off".       Medication List       Accurate as of May 26, 2020  9:04 AM. If you have any questions, ask your nurse or doctor.        Accu-Chek Aviva device by Other route. Use as instructed   Accu-Chek Guide test strip Generic drug: glucose blood Use to check blood sugar 3 times daily.E11.65   Accu-Chek Softclix Lancets lancets Use as instructed to check blood sugar 3 times daily.E11.65   amLODipine 10 MG tablet Commonly known as: NORVASC Take 1 tablet (10 mg total) by mouth daily.   aspirin EC 81 MG tablet Take 81 mg by mouth daily.   cilostazol 100 MG tablet Commonly known as: PLETAL Take 100 mg by mouth 2 (two) times daily.   gabapentin 300 MG capsule Commonly known as: NEURONTIN Take 300 mg by mouth 3 (three) times daily.   HumuLIN 70/30 (70-30) 100 UNIT/ML injection Generic  drug: insulin NPH-regular Human Inject 52 Units into the skin 2 (two) times daily with a meal.   Insulin Syringes (Disposable) U-100 1 ML Misc 1 Device by Does not apply route as directed.   losartan 50 MG tablet Commonly known as: COZAAR TAKE 1 TABLET BY MOUTH EVERY DAY   lovastatin 40 MG tablet Commonly known as: MEVACOR Take 1 tablet (40 mg total) by mouth at bedtime.   methocarbamol 500 MG tablet Commonly known as: Robaxin Take 1 tablet (500 mg total) by mouth every 8 (eight) hours as needed for muscle spasms.   montelukast 10 MG tablet Commonly known as: SINGULAIR TAKE 1 TABLET BY MOUTH EVERY DAY   traMADol 50 MG tablet Commonly known as: ULTRAM Take 1 tablet (50 mg total) by  mouth every 6 (six) hours as needed for moderate pain.   Tylenol 8 Hour Arthritis Pain 650 MG CR tablet Generic drug: acetaminophen Take 650 mg by mouth every 8 (eight) hours as needed for pain.   warfarin 2 MG tablet Commonly known as: COUMADIN Take as directed by the anticoagulation clinic. If you are unsure how to take this medication, talk to your nurse or doctor. Original instructions: Take 2 mg by mouth See admin instructions. Take 2mg  tablet along with the 6mg  tablet daily for total of 8mg  except on Wednesday and fridays take 5mg  tablet per patient.   warfarin 5 MG tablet Commonly known as: COUMADIN Take as directed by the anticoagulation clinic. If you are unsure how to take this medication, talk to your nurse or doctor. Original instructions: TAKE 1 TABLET (5 MG TOTAL) BY MOUTH DAILY. TAKE AS DIRECTED.   warfarin 6 MG tablet Commonly known as: COUMADIN Take as directed by the anticoagulation clinic. If you are unsure how to take this medication, talk to your nurse or doctor. Original instructions: TAKE 1 TABLET BY MOUTH EVERY DAY        OBJECTIVE:   Vital Signs: BP (!) 138/80 (BP Location: Left Arm, Patient Position: Sitting, Cuff Size: Large)   Pulse 82   Ht 6\' 2"  (1.88 m)   Wt (!) 248 lb 9.6 oz (112.8 kg)   SpO2 97%   BMI 31.92 kg/m   Wt Readings from Last 3 Encounters:  05/26/20 (!) 248 lb 9.6 oz (112.8 kg)  05/11/20 246 lb (111.6 kg)  04/27/20 246 lb 8 oz (111.8 kg)     Exam: General: Pt appears well and is in NAD  Lungs: Clear with good BS bilat with no rales, rhonchi, or wheezes  Heart: RRR   Extremities: 2+  pretibial edema.   Neuro: MS is good with appropriate affect, pt is alert and Ox3     DM foot exam: 02/23/2020  The skin of the feet is without sores or ulcerations. Toe nails thickened The pedal pulses are 2+ on right and 2+ on left. The sensation is decreased to a screening 5.07, 10 gram monofilament bilaterally   DATA REVIEWED:  Lab  Results  Component Value Date   HGBA1C 9.2 (A) 05/26/2020   HGBA1C 8.6 (H) 02/09/2020   HGBA1C 8.8 (H) 12/22/2019   Lab Results  Component Value Date   MICROALBUR 4.9 (H) 12/22/2019   CREATININE 1.95 (H) 03/30/2020   Lab Results  Component Value Date   MICRALBCREAT 3.5 12/22/2019     Lab Results  Component Value Date   CHOL 156 12/22/2019   HDL 41.40 12/22/2019   LDLDIRECT 80.0 12/22/2019   TRIG 251.0 (H) 12/22/2019  CHOLHDL 4 12/22/2019         ASSESSMENT / PLAN / RECOMMENDATIONS:   1) Type 2 Diabetes Mellitus, Poorly controlled, With Neuropathic and CKD III complications - Most recent A1c of 9.2 %. Goal A1c < 7.0 %.    - Unfortunately continues with worsening glycemic control , In review of his meter log, BG's fluctuate between 68 and 240 mg/dL, this is due to insulin- CHO mismatch. His hypoglycemic episodes was noted to be during the day, will adjust insulin as below     MEDICATIONS: Humulin Mix to 46 units Before breakfast and 56 units before supper   EDUCATION / INSTRUCTIONS:  BG monitoring instructions: Patient is instructed to check his blood sugars 2 times a day, before breakfast and supper.  Call [facility] if: BG persistently < 70 . I reviewed the Rule of 15 for the treatment of hypoglycemia in detail with the patient. Literature supplied.   2) Diabetic complications:   Eye: Does not have known diabetic retinopathy.   Neuro/ Feet: Does  have known diabetic peripheral neuropathy .   Renal: Patient does  have known baseline CKD. He   is not on an ACEIat present due to AKI.      F/U in 4 months     Signed electronically by: Mack Guise, MD  [facility] Endocrinology  [address] [place], [place] [address] Phone: [phone] FAX: [phone]   CC: [name], [place] [address] Phone: [phone]  Fax: [phone]  Return to  Endocrinology clinic as below: Future Appointments  Date Time Provider [place]  05/26/2020  9:10 AM Jaxtyn Linville, Melanie Crazier, MD [facility] None  05/30/2020  1:20 PM Elouise Munroe, MD [facility]  06/01/2020 10:00 AM [facility]

## 2020-05-30 ENCOUNTER — Other Ambulatory Visit: Payer: Self-pay

## 2020-05-30 ENCOUNTER — Ambulatory Visit: Payer: Medicare Other | Admitting: Internal Medicine

## 2020-05-30 ENCOUNTER — Encounter: Payer: Self-pay | Admitting: Internal Medicine

## 2020-05-30 ENCOUNTER — Ambulatory Visit (INDEPENDENT_AMBULATORY_CARE_PROVIDER_SITE_OTHER): Payer: Medicare Other | Admitting: Internal Medicine

## 2020-05-30 VITALS — BP 148/64 | HR 114 | Ht 73.0 in | Wt 246.0 lb

## 2020-05-30 DIAGNOSIS — R55 Syncope and collapse: Secondary | ICD-10-CM | POA: Diagnosis not present

## 2020-05-30 DIAGNOSIS — Z7901 Long term (current) use of anticoagulants: Secondary | ICD-10-CM

## 2020-05-30 DIAGNOSIS — E1142 Type 2 diabetes mellitus with diabetic polyneuropathy: Secondary | ICD-10-CM | POA: Diagnosis not present

## 2020-05-30 DIAGNOSIS — Z86711 Personal history of pulmonary embolism: Secondary | ICD-10-CM

## 2020-05-30 DIAGNOSIS — E782 Mixed hyperlipidemia: Secondary | ICD-10-CM

## 2020-05-30 DIAGNOSIS — I10 Essential (primary) hypertension: Secondary | ICD-10-CM | POA: Diagnosis not present

## 2020-05-30 DIAGNOSIS — Z794 Long term (current) use of insulin: Secondary | ICD-10-CM

## 2020-05-30 NOTE — Patient Instructions (Signed)
Medication Instructions:  Your Physician recommend you continue on your current medication as directed.    *If you need a refill on your cardiac medications before your next appointment, please call your pharmacy*   Lab Work: None   Testing/Procedures: None   Follow-Up: At [facility], you and your health needs are our priority.  As part of our continuing mission to provide you with exceptional heart care, we have created designated Provider Care Teams.  These Care Teams include your primary Cardiologist (physician) and Advanced Practice Providers (APPs -  Physician Assistants and Nurse Practitioners) who all work together to provide you with the care you need, when you need it.  We recommend signing up for the patient portal called "MyChart".  Sign up information is provided on this After Visit Summary.  MyChart is used to connect with patients for Virtual Visits (Telemedicine).  Patients are able to view lab/test results, encounter notes, upcoming appointments, etc.  Non-urgent messages can be sent to your provider as well.   To learn more about what you can do with MyChart, go to [email or web address].    Your next appointment:   6 month(s)  The format for your next appointment:   In Person  Provider:   Dr. Margaretann Loveless

## 2020-05-31 ENCOUNTER — Other Ambulatory Visit: Payer: Self-pay

## 2020-06-01 ENCOUNTER — Ambulatory Visit (INDEPENDENT_AMBULATORY_CARE_PROVIDER_SITE_OTHER): Payer: Medicare Other

## 2020-06-01 DIAGNOSIS — Z7901 Long term (current) use of anticoagulants: Secondary | ICD-10-CM | POA: Diagnosis not present

## 2020-06-01 DIAGNOSIS — Z86711 Personal history of pulmonary embolism: Secondary | ICD-10-CM

## 2020-06-01 LAB — POCT INR: INR: 4.3 — AB (ref 2.0–3.0)

## 2020-06-01 NOTE — Progress Notes (Signed)
I reviewed and agree with the documentation and plan as outlined below.   

## 2020-06-01 NOTE — Procedures (Signed)
PATIENT'S NAME:  [name], [name] DOB:      [birth year]      MR#:    [id number]     DATE OF RECORDING: 05/16/2020 REFERRING M.D.:  Dr. Gilmore Laroche Performed:   Baseline Polysomnogram HISTORY: 64 year old man with a history of arthritis, diabetes, history of PE, hypertension, kidney stones, prostate enlargement, hyperlipidemia, and obesity, who reports snoring and excessive daytime somnolence. The patient endorsed the Epworth Sleepiness Scale at 7 points. The patient's weight 246 pounds with a height of 74 (inches), resulting in a BMI of 31.7 kg/m2. The patient's neck circumference measured 18.15 inches.  CURRENT MEDICATIONS: Tylenol, Norvasc, Aspirin, Pletal, Neurontin, Cozaar, Mevacor, Robaxin, Ultram, Coumadin   PROCEDURE:  This is a multichannel digital polysomnogram utilizing the Somnostar 11.2 system.  Electrodes and sensors were applied and monitored per AASM Specifications.   EEG, EOG, Chin and Limb EMG, were sampled at 200 Hz.  ECG, Snore and Nasal Pressure, Thermal Airflow, Respiratory Effort, CPAP Flow and Pressure, Oximetry was sampled at 50 Hz. Digital video and audio were recorded.      BASELINE STUDY  Lights Out was at 22:32 and Lights On at 05:07.  Total recording time (TRT) was 395.5 minutes, with a total sleep time (TST) of 259 minutes.   The patient's sleep latency was 132.5 minutes, which is markedly delayed. REM latency was 89.5 minutes, which is normal.  The sleep efficiency was 65.5 %.     SLEEP ARCHITECTURE: WASO (Wake after sleep onset) was 3.5 minutes.  There were 3.5 minutes in Stage N1, 47 minutes Stage N2, 129.5 minutes Stage N3 and 79 minutes in Stage REM.  The percentage of Stage N1 was 1.4%, Stage N2 was 18.1%, Stage N3 was 50%, which is markedly increased, and Stage R (REM sleep) was 30.5%, which is increased. The arousals were noted as: 12 were spontaneous, 0 were associated with PLMs, 9 were associated with respiratory events.  RESPIRATORY ANALYSIS:  There were a total  of 50 respiratory events:  0 obstructive apneas, 0 central apneas and 0 mixed apneas with a total of 0 apneas and an apnea index (AI) of 0 /hour. There were 50 hypopneas with a hypopnea index of 11.6 /hour. The patient also had 0 respiratory event related arousals (RERAs).      The total APNEA/HYPOPNEA INDEX (AHI) was 11.6/hour and the total RESPIRATORY DISTURBANCE INDEX was  11.6 /hour.  47 events occurred in REM sleep and 6 events in NREM. The REM AHI was  35.7 /hour, versus a non-REM AHI of 1. The patient spent 259 minutes of total sleep time in the supine position and 0 minutes in non-supine.. The supine AHI was 11.6 versus a non-supine AHI of 0.0.  OXYGEN SATURATION & C02:  The Wake baseline 02 saturation was 98%, with the lowest being 77%. Time spent below 89% saturation equaled 37 minutes.  PERIODIC LIMB MOVEMENTS:   The patient had a total of 0 Periodic Limb Movements.  The Periodic Limb Movement (PLM) index was 0 and the PLM Arousal index was 0/hour.  Audio and video analysis did not show any abnormal or unusual movements, behaviors, phonations or vocalizations. The patient took no bathroom breaks. Mild to moderate snoring was noted. The EKG was in keeping with normal sinus rhythm (NSR).  Post-study, the patient indicated that sleep was the same as usual.   IMPRESSION:  1. Obstructive Sleep Apnea (OSA) 2. Dysfunctions associated with sleep stages or arousal from sleep  RECOMMENDATIONS:  1. This study demonstrates overall  mild obstructive sleep apnea, severe during REM sleep, with a total AHI of 11.6/hour, REM AHI of 35.7/hour, and O2 nadir of 77%. Given the patient's medical history and sleep related complaints, treatment with positive airway pressure is recommended; this can be achieved in the form of autoPAP. Alternatively, a full-night CPAP titration study would allow optimization of therapy if needed. Other treatment options may include avoidance of supine sleep position along with  weight loss, upper airway or jaw surgery in selected patients or the use of an oral appliance in certain patients. ENT evaluation and/or consultation with a maxillofacial surgeon or dentist may be feasible in some instances.    2. Please note that untreated obstructive sleep apnea may carry additional perioperative morbidity. Patients with significant obstructive sleep apnea should receive perioperative PAP therapy and the surgeons and particularly the anesthesiologist should be informed of the diagnosis and the severity of the sleep disordered breathing. 3. This study shows delay in sleep onset and abnormal sleep stage percentages; these are nonspecific findings and per se do not signify an intrinsic sleep disorder or a cause for the patient's sleep-related symptoms. Causes include (but are not limited to) the first night effect of the sleep study, circadian rhythm disturbances, medication effect or an underlying mood disorder or medical problem.  4. The patient should be cautioned not to drive, work at heights, or operate dangerous or heavy equipment when tired or sleepy. Review and reiteration of good sleep hygiene measures should be pursued with any patient. 5. The patient will be seen in follow-up by Dr. Rexene Alberts at [facility] for discussion of the test results and further management strategies. The referring provider will be notified of the test results.  I certify that I have reviewed the entire raw data recording prior to the issuance of this report in accordance with the Standards of Accreditation of the American Academy of Sleep Medicine (AASM)  Star Age, MD, PhD Diplomat, American Board of Neurology and Sleep Medicine (Neurology and Sleep Medicine)

## 2020-06-01 NOTE — Progress Notes (Signed)
Pt here for INR check per Dr. Ethelene Hal  Goal INR = 2.0-3.0  Last INR = 2.8  Pt currently takes Coumadin   Pt denies recent antibiotics, no dietary changes and no unusual bruising / bleeding.  INR today = 4.3  Pt advised per Dr. Loletha Grayer hold Friday, Saturday and Sunday, resume 5mg  Daily then come back in 2   weeks  for INR check.  Coumadin 7 mg on Monday, Wednesday and Friday. All other days take Coumadin 5 mg. Last dose this am 5mg 

## 2020-06-01 NOTE — Addendum Note (Signed)
Addended by: Star Age on: 06/01/2020 05:03 PM   Modules accepted: Orders

## 2020-06-01 NOTE — Progress Notes (Signed)
Patient referred by Dr. Ethelene Hal, seen by me on 04/27/20, diagnostic PSG on 05/16/20.    Please call and notify the patient that the recent sleep study showed obstructive sleep apnea. OSA is overall mild, but in the severe range during REM sleep. I recommend treatment in the form of autoPAP, which means, that we don't have to bring him back for a second sleep study with CPAP, but will let him try an autoPAP machine at home, through a DME company (of his choice, or as per insurance requirement). The DME representative will educate him on how to use the machine, how to put the mask on, etc. I have placed an order in the chart. Please send referral, talk to patient, send report to referring MD. We will need a FU in sleep clinic for 10 weeks post-PAP set up, please arrange that with me or one of our NPs. Thanks,   Star Age, MD, PhD [facility])

## 2020-06-05 ENCOUNTER — Telehealth: Payer: Self-pay

## 2020-06-05 NOTE — Telephone Encounter (Signed)
-----   Message from Star Age, MD sent at 06/01/2020  5:02 PM EDT ----- Patient referred by Dr. Ethelene Hal, seen by me on 04/27/20, diagnostic PSG on 05/16/20.    Please call and notify the patient that the recent sleep study showed obstructive sleep apnea. OSA is overall mild, but in the severe range during REM sleep. I recommend treatment in the form of autoPAP, which means, that we don't have to bring him back for a second sleep study with CPAP, but will let him try an autoPAP machine at home, through a DME company (of his choice, or as per insurance requirement). The DME representative will educate him on how to use the machine, how to put the mask on, etc. I have placed an order in the chart. Please send referral, talk to patient, send report to referring MD. We will need a FU in sleep clinic for 10 weeks post-PAP set up, please arrange that with me or one of our NPs. Thanks,   Star Age, MD, PhD [facility])

## 2020-06-05 NOTE — Telephone Encounter (Signed)
I called pt. I advised pt that Dr. Rexene Alberts reviewed their sleep study results and found that pt has osa. Dr. Rexene Alberts recommends that pt start an auto pap at home. I reviewed PAP compliance expectations with the pt. Pt is agreeable to starting an auto-PAP. I advised pt that an order will be sent to a DME, [facility], and [facility] will call the pt within about one week after they file with the pt's insurance. [facility] will show the pt how to use the machine, fit for masks, and troubleshoot the auto-PAP if needed. A follow up appt was made for insurance purposes with Amy, NP on 09/21/2020 at 10:00am. Pt verbalized understanding to arrive 15 minutes early and bring their auto-PAP. A letter with all of this information in it will be mailed to the pt as a reminder. I verified with the pt that the address we have on file is correct. Pt verbalized understanding of results. Pt had no questions at this time but was encouraged to call back if questions arise. I have sent the order to [employer] and have received confirmation that they have received the order.

## 2020-06-06 ENCOUNTER — Encounter (HOSPITAL_COMMUNITY)
Admission: EM | Disposition: A | Discharge status: Home / Self Care | Payer: Self-pay | Source: Home / Self Care | Attending: Emergency Medicine

## 2020-06-06 ENCOUNTER — Encounter (HOSPITAL_COMMUNITY): Payer: Self-pay | Admitting: Emergency Medicine

## 2020-06-06 ENCOUNTER — Encounter (HOSPITAL_COMMUNITY): Payer: Self-pay | Admitting: Certified Registered"

## 2020-06-06 ENCOUNTER — Observation Stay (HOSPITAL_COMMUNITY)
Admission: EM | Admit: 2020-06-06 | Discharge: 2020-06-08 | Disposition: A | Discharge status: Home / Self Care | Payer: Medicare Other | Attending: Internal Medicine | Admitting: Internal Medicine

## 2020-06-06 ENCOUNTER — Emergency Department (HOSPITAL_COMMUNITY): Payer: Medicare Other

## 2020-06-06 ENCOUNTER — Other Ambulatory Visit: Payer: Self-pay

## 2020-06-06 DIAGNOSIS — Z79899 Other long term (current) drug therapy: Secondary | ICD-10-CM | POA: Insufficient documentation

## 2020-06-06 DIAGNOSIS — Z794 Long term (current) use of insulin: Secondary | ICD-10-CM | POA: Diagnosis not present

## 2020-06-06 DIAGNOSIS — Z20822 Contact with and (suspected) exposure to covid-19: Secondary | ICD-10-CM | POA: Diagnosis not present

## 2020-06-06 DIAGNOSIS — E1165 Type 2 diabetes mellitus with hyperglycemia: Secondary | ICD-10-CM | POA: Diagnosis not present

## 2020-06-06 DIAGNOSIS — I129 Hypertensive chronic kidney disease with stage 1 through stage 4 chronic kidney disease, or unspecified chronic kidney disease: Secondary | ICD-10-CM | POA: Insufficient documentation

## 2020-06-06 DIAGNOSIS — Z7982 Long term (current) use of aspirin: Secondary | ICD-10-CM | POA: Insufficient documentation

## 2020-06-06 DIAGNOSIS — N183 Chronic kidney disease, stage 3 unspecified: Secondary | ICD-10-CM | POA: Insufficient documentation

## 2020-06-06 DIAGNOSIS — Z7901 Long term (current) use of anticoagulants: Secondary | ICD-10-CM | POA: Diagnosis not present

## 2020-06-06 DIAGNOSIS — N179 Acute kidney failure, unspecified: Secondary | ICD-10-CM

## 2020-06-06 DIAGNOSIS — R109 Unspecified abdominal pain: Secondary | ICD-10-CM | POA: Diagnosis present

## 2020-06-06 DIAGNOSIS — N200 Calculus of kidney: Principal | ICD-10-CM

## 2020-06-06 DIAGNOSIS — Z23 Encounter for immunization: Secondary | ICD-10-CM | POA: Diagnosis not present

## 2020-06-06 HISTORY — DX: Personal history of urinary calculi: Z87.442

## 2020-06-06 LAB — CBC
HCT: 37 % — ABNORMAL LOW (ref 39.0–52.0)
Hemoglobin: 11.7 g/dL — ABNORMAL LOW (ref 13.0–17.0)
MCH: 29 pg (ref 26.0–34.0)
MCHC: 31.6 g/dL (ref 30.0–36.0)
MCV: 91.8 fL (ref 80.0–100.0)
Platelets: 212 10*3/uL (ref 150–400)
RBC: 4.03 MIL/uL — ABNORMAL LOW (ref 4.22–5.81)
RDW: 13.3 % (ref 11.5–15.5)
WBC: 11.1 10*3/uL — ABNORMAL HIGH (ref 4.0–10.5)
nRBC: 0 % (ref 0.0–0.2)

## 2020-06-06 LAB — COMPREHENSIVE METABOLIC PANEL
ALT: 15 U/L (ref 0–44)
AST: 12 U/L — ABNORMAL LOW (ref 15–41)
Albumin: 3.6 g/dL (ref 3.5–5.0)
Alkaline Phosphatase: 63 U/L (ref 38–126)
Anion gap: 13 (ref 5–15)
BUN: 42 mg/dL — ABNORMAL HIGH (ref 8–23)
CO2: 16 mmol/L — ABNORMAL LOW (ref 22–32)
Calcium: 8.2 mg/dL — ABNORMAL LOW (ref 8.9–10.3)
Chloride: 106 mmol/L (ref 98–111)
Creatinine, Ser: 3.38 mg/dL — ABNORMAL HIGH (ref 0.61–1.24)
GFR calc Af Amer: 21 mL/min — ABNORMAL LOW (ref >60)
GFR calc non Af Amer: 18 mL/min — ABNORMAL LOW (ref >60)
Glucose, Bld: 215 mg/dL — ABNORMAL HIGH (ref 70–99)
Potassium: 5 mmol/L (ref 3.5–5.1)
Sodium: 135 mmol/L (ref 135–145)
Total Bilirubin: 0.9 mg/dL (ref 0.3–1.2)
Total Protein: 7 g/dL (ref 6.5–8.1)

## 2020-06-06 LAB — URINALYSIS, ROUTINE W REFLEX MICROSCOPIC
Bacteria, UA: NONE SEEN
Bilirubin Urine: NEGATIVE
Glucose, UA: 150 mg/dL — AB
Ketones, ur: NEGATIVE mg/dL
Leukocytes,Ua: NEGATIVE
Nitrite: NEGATIVE
Protein, ur: 30 mg/dL — AB
RBC / HPF: >50 RBC/hpf — ABNORMAL HIGH (ref 0–5)
Specific Gravity, Urine: 1.017 (ref 1.005–1.030)
pH: 5 (ref 5.0–8.0)

## 2020-06-06 LAB — CBG MONITORING, ED
Glucose-Capillary: 123 mg/dL — ABNORMAL HIGH (ref 70–99)
Glucose-Capillary: 119 mg/dL — ABNORMAL HIGH (ref 70–99)
Glucose-Capillary: 205 mg/dL — ABNORMAL HIGH (ref 70–99)

## 2020-06-06 LAB — URIC ACID: Uric Acid, Serum: 7.2 mg/dL (ref 3.7–8.6)

## 2020-06-06 LAB — LIPASE, BLOOD: Lipase: 51 U/L (ref 11–51)

## 2020-06-06 LAB — SARS CORONAVIRUS 2 BY RT PCR (HOSPITAL ORDER, PERFORMED IN ~~LOC~~ HOSPITAL LAB): SARS Coronavirus 2: NEGATIVE

## 2020-06-06 SURGERY — CYSTOURETEROSCOPY, WITH STENT INSERTION
Anesthesia: General | Laterality: Right

## 2020-06-06 MED ORDER — SODIUM CHLORIDE 0.9 % IV BOLUS (SEPSIS)
1000.0000 mL | Freq: Once | INTRAVENOUS | Status: AC
  Administered 2020-06-06: 1000 mL via INTRAVENOUS

## 2020-06-06 MED ORDER — HEPARIN SODIUM (PORCINE) 5000 UNIT/ML IJ SOLN
5000.0000 [IU] | Freq: Two times a day (BID) | INTRAMUSCULAR | Status: DC
  Administered 2020-06-06 – 2020-06-08 (×3): 5000 [IU] via SUBCUTANEOUS
  Filled 2020-06-06 (×3): qty 1

## 2020-06-06 MED ORDER — ONDANSETRON HCL 4 MG/2ML IJ SOLN
4.0000 mg | Freq: Once | INTRAMUSCULAR | Status: AC
  Administered 2020-06-06: 4 mg via INTRAVENOUS
  Filled 2020-06-06: qty 2

## 2020-06-06 MED ORDER — SODIUM CHLORIDE 0.9% FLUSH: 3.0000 mL | Freq: Once | INTRAVENOUS | Status: DC

## 2020-06-06 MED ORDER — ASPIRIN EC 81 MG PO TBEC
81.0000 mg | DELAYED_RELEASE_TABLET | Freq: Every day | ORAL | Status: DC
  Administered 2020-06-06 – 2020-06-08 (×3): 81 mg via ORAL
  Filled 2020-06-06 (×3): qty 1

## 2020-06-06 MED ORDER — HYDRALAZINE HCL 25 MG PO TABS: 25.0000 mg | ORAL_TABLET | Freq: Four times a day (QID) | ORAL | Status: DC | PRN

## 2020-06-06 MED ORDER — SODIUM BICARBONATE 650 MG PO TABS
650.0000 mg | ORAL_TABLET | Freq: Three times a day (TID) | ORAL | Status: DC
  Administered 2020-06-06 – 2020-06-08 (×4): 650 mg via ORAL
  Filled 2020-06-06 (×5): qty 1

## 2020-06-06 MED ORDER — MORPHINE SULFATE (PF) 4 MG/ML IV SOLN
4.0000 mg | Freq: Once | INTRAVENOUS | Status: AC
  Administered 2020-06-06: 4 mg via INTRAVENOUS
  Filled 2020-06-06: qty 1

## 2020-06-06 MED ORDER — PRAVASTATIN SODIUM 40 MG PO TABS
40.0000 mg | ORAL_TABLET | Freq: Every day | ORAL | Status: DC
  Administered 2020-06-06: 40 mg via ORAL
  Filled 2020-06-06: qty 1

## 2020-06-06 MED ORDER — AMLODIPINE BESYLATE 10 MG PO TABS
10.0000 mg | ORAL_TABLET | Freq: Every day | ORAL | Status: DC
  Administered 2020-06-06 – 2020-06-08 (×3): 10 mg via ORAL
  Filled 2020-06-06: qty 2
  Filled 2020-06-06 (×2): qty 1

## 2020-06-06 MED ORDER — GABAPENTIN 300 MG PO CAPS: 300.0000 mg | ORAL_CAPSULE | Freq: Three times a day (TID) | ORAL | Status: DC | PRN

## 2020-06-06 MED ORDER — METHOCARBAMOL 500 MG PO TABS: 500.0000 mg | ORAL_TABLET | Freq: Three times a day (TID) | ORAL | Status: DC | PRN

## 2020-06-06 MED ORDER — INSULIN ASPART 100 UNIT/ML ~~LOC~~ SOLN
0.0000 [IU] | Freq: Three times a day (TID) | SUBCUTANEOUS | Status: DC
  Administered 2020-06-07: 2 [IU] via SUBCUTANEOUS
  Administered 2020-06-08: 5 [IU] via SUBCUTANEOUS
  Administered 2020-06-08: 3 [IU] via SUBCUTANEOUS

## 2020-06-06 MED ORDER — TAMSULOSIN HCL 0.4 MG PO CAPS
0.4000 mg | ORAL_CAPSULE | Freq: Once | ORAL | Status: AC
  Administered 2020-06-06: 0.4 mg via ORAL
  Filled 2020-06-06: qty 1

## 2020-06-06 MED ORDER — LOSARTAN POTASSIUM 50 MG PO TABS: 50.0000 mg | ORAL_TABLET | Freq: Every day | ORAL | Status: DC

## 2020-06-06 MED ORDER — CILOSTAZOL 100 MG PO TABS
100.0000 mg | ORAL_TABLET | Freq: Two times a day (BID) | ORAL | Status: DC
  Administered 2020-06-06 – 2020-06-08 (×4): 100 mg via ORAL
  Filled 2020-06-06 (×5): qty 1

## 2020-06-06 MED ORDER — INSULIN ASPART 100 UNIT/ML ~~LOC~~ SOLN
0.0000 [IU] | Freq: Every day | SUBCUTANEOUS | Status: DC
  Administered 2020-06-06: 2 [IU] via SUBCUTANEOUS

## 2020-06-06 MED ORDER — OXYCODONE HCL 5 MG PO TABS: 5.0000 mg | ORAL_TABLET | ORAL | Status: DC | PRN

## 2020-06-06 MED ORDER — MONTELUKAST SODIUM 10 MG PO TABS
10.0000 mg | ORAL_TABLET | Freq: Every day | ORAL | Status: DC
  Administered 2020-06-06 – 2020-06-07 (×2): 10 mg via ORAL
  Filled 2020-06-06 (×2): qty 1

## 2020-06-06 MED ORDER — ACETAMINOPHEN 325 MG PO TABS
650.0000 mg | ORAL_TABLET | Freq: Three times a day (TID) | ORAL | Status: DC | PRN
  Administered 2020-06-07: 650 mg via ORAL
  Filled 2020-06-06: qty 2

## 2020-06-06 NOTE — ED Triage Notes (Signed)
Patient reports right abdominal pain this evening , no emesis or diarrhea , denies fever or chills .

## 2020-06-06 NOTE — Consult Note (Signed)
H&P Physician requesting consult: Aletta Edouard  Chief Complaint: Right renal calculus, acute renal insufficiency  History of Present Illness: 64 year old male with history of nephrolithiasis.  He presented to the emergency department with severe right-sided abdominal pain.  CT scan was performed that revealed a 7 mm mid ureteral calculus as well as some small tiny calculi upstream from this.  His pain has improved.  However, creatinine is 3.38 from a value of 1.95.  He denies any recent nausea or vomiting.  He denies any significant voiding complaints.  He is on Coumadin but this has been held for several days because his INR was 4.3.  Given his renal insufficiency and stone, I had posted him for the operating room tonight but he unfortunately ate.  Again, he is currently stable.  Past Medical History:  Diagnosis Date  . Arthritis   . Diabetes mellitus without complication ([place])   . History of pulmonary embolus (PE)   . Hypertension   . Kidney stones   . Lipidemia   . Prostate enlargement    History reviewed. No pertinent surgical history.  Home Medications:  (Not in a hospital admission)  Allergies:  Allergies  Allergen Reactions  . Lisinopril Other (See Comments)    Patient states that this medication makes him feel "off". Tolerates losartan.    Family History  Problem Relation Age of Onset  . Cancer Mother   . Heart disease Father    Social History:  reports that he has never smoked. He has never used smokeless tobacco. He reports current alcohol use of about 1.0 standard drink of alcohol per week. He reports that he does not use drugs.  ROS: A complete review of systems was performed.  All systems are negative except for pertinent findings as noted. ROS   Physical Exam:  Vital signs in last 24 hours: Temp:  [97.4 F (36.3 C)-98.8 F (37.1 C)] 97.4 F (36.3 C) (08/03 0930) Pulse Rate:  [87-107] 87 (08/03 1841) Resp:  [14-20] 16 (08/03 1841) BP: (139-167)/(75-84)  139/80 (08/03 1841) SpO2:  [95 %-98 %] 97 % (08/03 1841) Weight:  [202 kg] 125 kg (08/03 0152) General:  Alert and oriented, No acute distress HEENT: Normocephalic, atraumatic Neck: No JVD or lymphadenopathy Cardiovascular: Regular rate and rhythm Lungs: Regular rate and effort Abdomen: Soft, nontender, nondistended, no abdominal masses Back: No CVA tenderness Extremities: No edema Neurologic: Grossly intact  Laboratory Data:  Results for orders placed or performed during the hospital encounter of 06/06/20 (from the past 24 hour(s))  Lipase, blood     Status: None   Collection Time: 06/06/20  2:02 AM  Result Value Ref Range   Lipase 51 11 - 51 U/L  Comprehensive metabolic panel     Status: Abnormal   Collection Time: 06/06/20  2:02 AM  Result Value Ref Range   Sodium 135 135 - 145 mmol/L   Potassium 5.0 3.5 - 5.1 mmol/L   Chloride 106 98 - 111 mmol/L   CO2 16 (L) 22 - 32 mmol/L   Glucose, Bld 215 (H) 70 - 99 mg/dL   BUN 42 (H) 8 - 23 mg/dL   Creatinine, Ser 3.38 (H) 0.61 - 1.24 mg/dL   Calcium 8.2 (L) 8.9 - 10.3 mg/dL   Total Protein 7.0 6.5 - 8.1 g/dL   Albumin 3.6 3.5 - 5.0 g/dL   AST 12 (L) 15 - 41 U/L   ALT 15 0 - 44 U/L   Alkaline Phosphatase 63 38 - 126 U/L  Total Bilirubin 0.9 0.3 - 1.2 mg/dL   GFR calc non Af Amer 18 (L) >60 mL/min   GFR calc Af Amer 21 (L) >60 mL/min   Anion gap 13 5 - 15  CBC     Status: Abnormal   Collection Time: 06/06/20  2:02 AM  Result Value Ref Range   WBC 11.1 (H) 4.0 - 10.5 K/uL   RBC 4.03 (L) 4.22 - 5.81 MIL/uL   Hemoglobin 11.7 (L) 13.0 - 17.0 g/dL   HCT 37.0 (L) 39 - 52 %   MCV 91.8 80.0 - 100.0 fL   MCH 29.0 26.0 - 34.0 pg   MCHC 31.6 30.0 - 36.0 g/dL   RDW 13.3 11.5 - 15.5 %   Platelets 212 150 - 400 K/uL   nRBC 0.0 0.0 - 0.2 %  Urinalysis, Routine w reflex microscopic     Status: Abnormal   Collection Time: 06/06/20  2:04 AM  Result Value Ref Range   Color, Urine YELLOW YELLOW   APPearance CLEAR CLEAR   Specific  Gravity, Urine 1.017 1.005 - 1.030   pH 5.0 5.0 - 8.0   Glucose, UA 150 (A) NEGATIVE mg/dL   Hgb urine dipstick LARGE (A) NEGATIVE   Bilirubin Urine NEGATIVE NEGATIVE   Ketones, ur NEGATIVE NEGATIVE mg/dL   Protein, ur 30 (A) NEGATIVE mg/dL   Nitrite NEGATIVE NEGATIVE   Leukocytes,Ua NEGATIVE NEGATIVE   RBC / HPF >50 (H) 0 - 5 RBC/hpf   WBC, UA 6-10 0 - 5 WBC/hpf   Bacteria, UA NONE SEEN NONE SEEN   Mucus PRESENT   CBG monitoring, ED     Status: Abnormal   Collection Time: 06/06/20  4:35 PM  Result Value Ref Range   Glucose-Capillary 123 (H) 70 - 99 mg/dL  Uric acid     Status: None   Collection Time: 06/06/20  5:36 PM  Result Value Ref Range   Uric Acid, Serum 7.2 3.7 - 8.6 mg/dL  SARS Coronavirus 2 by RT PCR (hospital order, performed in [facility] hospital lab) Nasopharyngeal Nasopharyngeal Swab     Status: None   Collection Time: 06/06/20  5:37 PM   Specimen: Nasopharyngeal Swab  Result Value Ref Range   SARS Coronavirus 2 NEGATIVE NEGATIVE  CBG monitoring, ED     Status: Abnormal   Collection Time: 06/06/20  6:18 PM  Result Value Ref Range   Glucose-Capillary 119 (H) 70 - 99 mg/dL   Comment 1 Notify RN    Comment 2 Document in Chart    Recent Results (from the past 240 hour(s))  SARS Coronavirus 2 by RT PCR (hospital order, performed in [facility] hospital lab) Nasopharyngeal Nasopharyngeal Swab     Status: None   Collection Time: 06/06/20  5:37 PM   Specimen: Nasopharyngeal Swab  Result Value Ref Range Status   SARS Coronavirus 2 NEGATIVE NEGATIVE Final    Comment: (NOTE) SARS-CoV-2 target nucleic acids are NOT DETECTED.  The SARS-CoV-2 RNA is generally detectable in upper and lower respiratory specimens during the acute phase of infection. The lowest concentration of SARS-CoV-2 viral copies this assay can detect is 250 copies / mL. A negative result does not preclude SARS-CoV-2 infection and should not be used as the sole basis for treatment or other patient  management decisions.  A negative result may occur with improper specimen collection / handling, submission of specimen other than nasopharyngeal swab, presence of viral mutation(s) within the areas targeted by this assay, and inadequate number of  viral copies (<250 copies / mL). A negative result must be combined with clinical observations, patient history, and epidemiological information.  Fact Sheet for Patients:   StrictlyIdeas.no  Fact Sheet for Healthcare Providers: BankingDealers.co.za  This test is not yet approved or  cleared by the [place] FDA and has been authorized for detection and/or diagnosis of SARS-CoV-2 by FDA under an Emergency Use Authorization (EUA).  This EUA will remain in effect (meaning this test can be used) for the duration of the COVID-19 declaration under Section 564(b)(1) of the Act, 21 U.S.C. section 360bbb-3(b)(1), unless the authorization is terminated or revoked sooner.  Performed at [facility], [place] [address], [place], [place] [address]    Creatinine: Recent Labs    06/06/20 0202  CREATININE 3.38*   CT scan personally reviewed and is detailed in the history of present illness  Impression/Assessment:  Right renal calculus Acute renal insufficiency on top of CKD stage III  Plan:  Continue IV fluids.  Consider stent versus ureteroscopy tomorrow.  If he needs ureteroscopy, he will need to be transferred over to [facility].  Marton Redwood, III 06/06/2020, 8:54 PM

## 2020-06-06 NOTE — H&P (View-Only) (Signed)
H&P Physician requesting consult: Aletta Edouard  Chief Complaint: Right renal calculus, acute renal insufficiency  History of Present Illness: 64 year old male with history of nephrolithiasis.  He presented to the emergency department with severe right-sided abdominal pain.  CT scan was performed that revealed a 7 mm mid ureteral calculus as well as some small tiny calculi upstream from this.  His pain has improved.  However, creatinine is 3.38 from a value of 1.95.  He denies any recent nausea or vomiting.  He denies any significant voiding complaints.  He is on Coumadin but this has been held for several days because his INR was 4.3.  Given his renal insufficiency and stone, I had posted him for the operating room tonight but he unfortunately ate.  Again, he is currently stable.  Past Medical History:  Diagnosis Date  . Arthritis   . Diabetes mellitus without complication ([place])   . History of pulmonary embolus (PE)   . Hypertension   . Kidney stones   . Lipidemia   . Prostate enlargement    History reviewed. No pertinent surgical history.  Home Medications:  (Not in a hospital admission)  Allergies:  Allergies  Allergen Reactions  . Lisinopril Other (See Comments)    Patient states that this medication makes him feel "off". Tolerates losartan.    Family History  Problem Relation Age of Onset  . Cancer Mother   . Heart disease Father    Social History:  reports that he has never smoked. He has never used smokeless tobacco. He reports current alcohol use of about 1.0 standard drink of alcohol per week. He reports that he does not use drugs.  ROS: A complete review of systems was performed.  All systems are negative except for pertinent findings as noted. ROS   Physical Exam:  Vital signs in last 24 hours: Temp:  [97.4 F (36.3 C)-98.8 F (37.1 C)] 97.4 F (36.3 C) (08/03 0930) Pulse Rate:  [87-107] 87 (08/03 1841) Resp:  [14-20] 16 (08/03 1841) BP: (139-167)/(75-84)  139/80 (08/03 1841) SpO2:  [95 %-98 %] 97 % (08/03 1841) Weight:  [425 kg] 125 kg (08/03 0152) General:  Alert and oriented, No acute distress HEENT: Normocephalic, atraumatic Neck: No JVD or lymphadenopathy Cardiovascular: Regular rate and rhythm Lungs: Regular rate and effort Abdomen: Soft, nontender, nondistended, no abdominal masses Back: No CVA tenderness Extremities: No edema Neurologic: Grossly intact  Laboratory Data:  Results for orders placed or performed during the hospital encounter of 06/06/20 (from the past 24 hour(s))  Lipase, blood     Status: None   Collection Time: 06/06/20  2:02 AM  Result Value Ref Range   Lipase 51 11 - 51 U/L  Comprehensive metabolic panel     Status: Abnormal   Collection Time: 06/06/20  2:02 AM  Result Value Ref Range   Sodium 135 135 - 145 mmol/L   Potassium 5.0 3.5 - 5.1 mmol/L   Chloride 106 98 - 111 mmol/L   CO2 16 (L) 22 - 32 mmol/L   Glucose, Bld 215 (H) 70 - 99 mg/dL   BUN 42 (H) 8 - 23 mg/dL   Creatinine, Ser 3.38 (H) 0.61 - 1.24 mg/dL   Calcium 8.2 (L) 8.9 - 10.3 mg/dL   Total Protein 7.0 6.5 - 8.1 g/dL   Albumin 3.6 3.5 - 5.0 g/dL   AST 12 (L) 15 - 41 U/L   ALT 15 0 - 44 U/L   Alkaline Phosphatase 63 38 - 126 U/L  Total Bilirubin 0.9 0.3 - 1.2 mg/dL   GFR calc non Af Amer 18 (L) >60 mL/min   GFR calc Af Amer 21 (L) >60 mL/min   Anion gap 13 5 - 15  CBC     Status: Abnormal   Collection Time: 06/06/20  2:02 AM  Result Value Ref Range   WBC 11.1 (H) 4.0 - 10.5 K/uL   RBC 4.03 (L) 4.22 - 5.81 MIL/uL   Hemoglobin 11.7 (L) 13.0 - 17.0 g/dL   HCT 37.0 (L) 39 - 52 %   MCV 91.8 80.0 - 100.0 fL   MCH 29.0 26.0 - 34.0 pg   MCHC 31.6 30.0 - 36.0 g/dL   RDW 13.3 11.5 - 15.5 %   Platelets 212 150 - 400 K/uL   nRBC 0.0 0.0 - 0.2 %  Urinalysis, Routine w reflex microscopic     Status: Abnormal   Collection Time: 06/06/20  2:04 AM  Result Value Ref Range   Color, Urine YELLOW YELLOW   APPearance CLEAR CLEAR   Specific  Gravity, Urine 1.017 1.005 - 1.030   pH 5.0 5.0 - 8.0   Glucose, UA 150 (A) NEGATIVE mg/dL   Hgb urine dipstick LARGE (A) NEGATIVE   Bilirubin Urine NEGATIVE NEGATIVE   Ketones, ur NEGATIVE NEGATIVE mg/dL   Protein, ur 30 (A) NEGATIVE mg/dL   Nitrite NEGATIVE NEGATIVE   Leukocytes,Ua NEGATIVE NEGATIVE   RBC / HPF >50 (H) 0 - 5 RBC/hpf   WBC, UA 6-10 0 - 5 WBC/hpf   Bacteria, UA NONE SEEN NONE SEEN   Mucus PRESENT   CBG monitoring, ED     Status: Abnormal   Collection Time: 06/06/20  4:35 PM  Result Value Ref Range   Glucose-Capillary 123 (H) 70 - 99 mg/dL  Uric acid     Status: None   Collection Time: 06/06/20  5:36 PM  Result Value Ref Range   Uric Acid, Serum 7.2 3.7 - 8.6 mg/dL  SARS Coronavirus 2 by RT PCR (hospital order, performed in [facility] hospital lab) Nasopharyngeal Nasopharyngeal Swab     Status: None   Collection Time: 06/06/20  5:37 PM   Specimen: Nasopharyngeal Swab  Result Value Ref Range   SARS Coronavirus 2 NEGATIVE NEGATIVE  CBG monitoring, ED     Status: Abnormal   Collection Time: 06/06/20  6:18 PM  Result Value Ref Range   Glucose-Capillary 119 (H) 70 - 99 mg/dL   Comment 1 Notify RN    Comment 2 Document in Chart    Recent Results (from the past 240 hour(s))  SARS Coronavirus 2 by RT PCR (hospital order, performed in [facility] hospital lab) Nasopharyngeal Nasopharyngeal Swab     Status: None   Collection Time: 06/06/20  5:37 PM   Specimen: Nasopharyngeal Swab  Result Value Ref Range Status   SARS Coronavirus 2 NEGATIVE NEGATIVE Final    Comment: (NOTE) SARS-CoV-2 target nucleic acids are NOT DETECTED.  The SARS-CoV-2 RNA is generally detectable in upper and lower respiratory specimens during the acute phase of infection. The lowest concentration of SARS-CoV-2 viral copies this assay can detect is 250 copies / mL. A negative result does not preclude SARS-CoV-2 infection and should not be used as the sole basis for treatment or other patient  management decisions.  A negative result may occur with improper specimen collection / handling, submission of specimen other than nasopharyngeal swab, presence of viral mutation(s) within the areas targeted by this assay, and inadequate number of  viral copies (<250 copies / mL). A negative result must be combined with clinical observations, patient history, and epidemiological information.  Fact Sheet for Patients:   StrictlyIdeas.no  Fact Sheet for Healthcare Providers: BankingDealers.co.za  This test is not yet approved or  cleared by the [place] FDA and has been authorized for detection and/or diagnosis of SARS-CoV-2 by FDA under an Emergency Use Authorization (EUA).  This EUA will remain in effect (meaning this test can be used) for the duration of the COVID-19 declaration under Section 564(b)(1) of the Act, 21 U.S.C. section 360bbb-3(b)(1), unless the authorization is terminated or revoked sooner.  Performed at [facility], [place] [address] [place], [place] [address]    Creatinine: Recent Labs    06/06/20 0202  CREATININE 3.38*   CT scan personally reviewed and is detailed in the history of present illness  Impression/Assessment:  Right renal calculus Acute renal insufficiency on top of CKD stage III  Plan:  Continue IV fluids.  Consider stent versus ureteroscopy tomorrow.  If he needs ureteroscopy, he will need to be transferred over to [facility].  Marton Redwood, III 06/06/2020, 8:54 PM

## 2020-06-06 NOTE — H&P (Signed)
History and Physical    [name] UJW:[id number] DOB: [birth year] DOA: 06/06/2020  PCP: [name], MD (Confirm with patient/family/NH records and if not entered, this has to be entered at [facility] point of entry) Patient coming from: Home  I have personally briefly reviewed patient's old medical records in [place]  Chief Complaint: Abd pain  HPI: [name] is a 64 y.o. male with medical history significant of kidney stones, CKD stage III, IDDM, hypertension, PE on Coumadin, BPH, presented with new onset of right sided flank pain.  Symptoms started yesterday evening, sharp like, she took some Tylenol without significant improvement.  Overnight the pain radiated down to right-sided groin area.,  He denies any fever chills, no dysuria no diarrhea.  And no nauseous or vomit no chest pain or short of breath.  Patient reported he has chronic kidney stone problems, see past multiple small stones in April of this year.  Also has had worsening of kidney functions along with worsening of peripheral edema mainly on the right side leg and right ankle and right toes.  He denies any history of gout, but he does admit his father has gout. ED Course: CT abdomen showed 3 small stones in the mid third of the right ureter associated with hydronephrosis and hydroureter, with the largest one measuring 7 mm.  Multiple additional small kidney stones bilaterally.  WBC 11.1, creatinine 3.3, CO2 16, potassium 5.0  Review of Systems: As per HPI otherwise 10 point review of systems negative.    Past Medical History:  Diagnosis Date  . Arthritis   . Diabetes mellitus without complication ([place])   . History of pulmonary embolus (PE)   . Hypertension   . Kidney stones   . Lipidemia   . Prostate enlargement     History reviewed. No pertinent surgical history.   reports that he has never smoked. He has never used smokeless tobacco. He reports current alcohol use of about 1.0 standard drink of alcohol  per week. He reports that he does not use drugs.  Allergies  Allergen Reactions  . Lisinopril Other (See Comments)    Patient states that this medication makes him feel "off". Tolerates losartan.    Family History  Problem Relation Age of Onset  . Cancer Mother   . Heart disease Father      Prior to Admission medications   Medication Sig Start Date End Date Taking? Authorizing Provider  acetaminophen (TYLENOL 8 HOUR ARTHRITIS PAIN) 650 MG CR tablet Take 650 mg by mouth every 8 (eight) hours as needed for pain.   Yes [provider]  amLODipine (NORVASC) 10 MG tablet Take 1 tablet (10 mg total) by mouth daily. 02/18/20 06/06/21 Yes Elouise Munroe, MD  aspirin EC 81 MG tablet Take 81 mg by mouth daily.   Yes [provider]  cilostazol (PLETAL) 100 MG tablet Take 100 mg by mouth 2 (two) times daily.   Yes [provider]  gabapentin (NEURONTIN) 300 MG capsule Take 300 mg by mouth 3 (three) times daily as needed (pain).    Yes [provider]  insulin NPH-regular Human (HUMULIN 70/30) (70-30) 100 UNIT/ML injection Inject 46 Units into the skin daily with breakfast AND 56 Units daily with supper. Patient taking differently: Inject 30-50 Units into the skin daily with breakfast AND 50 Units daily with supper. 05/26/20  Yes Shamleffer, Melanie Crazier, MD  losartan (COZAAR) 50 MG tablet TAKE 1 TABLET BY MOUTH EVERY DAY Patient taking differently:  Take 50 mg by mouth daily.  05/26/20  Yes [name], MD  lovastatin (MEVACOR) 40 MG tablet Take 1 tablet (40 mg total) by mouth at bedtime. 04/07/20  Yes [name], MD  methocarbamol (ROBAXIN) 500 MG tablet Take 1 tablet (500 mg total) by mouth every 8 (eight) hours as needed for muscle spasms. 03/30/20  Yes [name], MD  montelukast (SINGULAIR) 10 MG tablet TAKE 1 TABLET BY MOUTH EVERY DAY Patient taking differently: Take 10 mg by mouth at bedtime.  05/19/20  Yes [name], MD  traMADol (ULTRAM) 50 MG tablet Take 1 tablet (50 mg total) by mouth every 6 (six) hours as needed for moderate pain. 03/30/20  Yes [name], MD  warfarin (COUMADIN) 5 MG tablet TAKE 1 TABLET (5 MG TOTAL) BY MOUTH DAILY. TAKE AS DIRECTED. Patient taking differently: Take 5 mg by mouth daily.  03/21/20  Yes [name], MD  Accu-Chek Softclix Lancets lancets Use as instructed to check blood sugar 3 times daily.E11.65 05/22/20   Shamleffer, Melanie Crazier, MD  Blood Glucose Monitoring Suppl (ACCU-CHEK AVIVA) device by Other route. Use as instructed    [provider]  glucose blood (ACCU-CHEK GUIDE) test strip Use to check blood sugar 3 times daily.E11.65 05/22/20   Shamleffer, Melanie Crazier, MD  Insulin Syringes, Disposable, U-100 1 ML MISC 1 Device by Does not apply route as directed. 02/23/20   Shamleffer, Melanie Crazier, MD  warfarin (COUMADIN) 6 MG tablet TAKE 1 TABLET BY MOUTH EVERY DAY Patient not taking: Reported on 06/06/2020 05/19/20   [name], MD    Physical Exam: Vitals:   06/06/20 0152 06/06/20 0621 06/06/20 0827 06/06/20 0930  BP:  (!) 149/75 (!) 148/82 (!) 159/84  Pulse:  89 93 98  Resp:  18 20 14   Temp:  97.9 F (36.6 C) (!) 97.5 F (36.4 C) (!) 97.4 F (36.3 C)  TempSrc:  Oral Oral Oral  SpO2:  98% 98% 98%  Weight: 125 kg     Height: 6\' 2"  (1.88 m)       Constitutional: NAD, calm, comfortable Vitals:   06/06/20 0152 06/06/20 0621 06/06/20 0827 06/06/20 0930  BP:  (!) 149/75 (!) 148/82 (!) 159/84  Pulse:  89 93 98  Resp:  18 20 14   Temp:  97.9 F (36.6 C) (!) 97.5 F (36.4 C) (!) 97.4 F (36.3 C)  TempSrc:  Oral Oral Oral  SpO2:  98% 98% 98%  Weight: 125 kg     Height: 6\' 2"  (1.88 m)      Eyes: PERRL, lids and conjunctivae normal ENMT: Mucous membranes are moist. Posterior pharynx clear of any exudate or lesions.Normal dentition.  Neck: normal, supple, no masses, no thyromegaly Respiratory: clear to  auscultation bilaterally, no wheezing, no crackles. Normal respiratory effort. No accessory muscle use.  Cardiovascular: Regular rate and rhythm, no murmurs / rubs / gallops. 2+ extremity edema. 2+ pedal pulses. No carotid bruits.  Abdomen: Right CVA tenderness, no masses palpated. No hepatosplenomegaly. Bowel sounds positive.  Musculoskeletal: no clubbing / cyanosis.  Swelling of right ankle and right big toe. Good ROM, no contractures. Normal muscle tone.  Skin: no rashes, lesions, ulcers. No induration Neurologic: CN 2-12 grossly intact. Sensation intact, DTR normal. Strength 5/5 in all 4.  Psychiatric: Normal judgment and insight. Alert and oriented x 3. Normal mood.     Labs on Admission: I have personally reviewed following labs and imaging studies  CBC:  Recent Labs  Lab 06/06/20 0202  WBC 11.1*  HGB 11.7*  HCT 37.0*  MCV 91.8  PLT 662   Basic Metabolic Panel: Recent Labs  Lab 06/06/20 0202  NA 135  K 5.0  CL 106  CO2 16*  GLUCOSE 215*  BUN 42*  CREATININE 3.38*  CALCIUM 8.2*   GFR: Estimated Creatinine Clearance: 30.2 mL/min (A) (by C-G formula based on SCr of 3.38 mg/dL (H)). Liver Function Tests: Recent Labs  Lab 06/06/20 0202  AST 12*  ALT 15  ALKPHOS 63  BILITOT 0.9  PROT 7.0  ALBUMIN 3.6   Recent Labs  Lab 06/06/20 0202  LIPASE 51   No results for input(s): AMMONIA in the last 168 hours. Coagulation Profile: Recent Labs  Lab 06/01/20 1018  INR 4.3*   Cardiac Enzymes: No results for input(s): CKTOTAL, CKMB, CKMBINDEX, TROPONINI in the last 168 hours. BNP (last 3 results) No results for input(s): PROBNP in the last 8760 hours. HbA1C: No results for input(s): HGBA1C in the last 72 hours. CBG: No results for input(s): GLUCAP in the last 168 hours. Lipid Profile: No results for input(s): CHOL, HDL, LDLCALC, TRIG, CHOLHDL, LDLDIRECT in the last 72 hours. Thyroid Function Tests: No results for input(s): TSH, T4TOTAL, FREET4, T3FREE,  THYROIDAB in the last 72 hours. Anemia Panel: No results for input(s): VITAMINB12, FOLATE, FERRITIN, TIBC, IRON, RETICCTPCT in the last 72 hours. Urine analysis:    Component Value Date/Time   COLORURINE YELLOW 06/06/2020 0204   APPEARANCEUR CLEAR 06/06/2020 0204   LABSPEC 1.017 06/06/2020 0204   PHURINE 5.0 06/06/2020 0204   GLUCOSEU 150 (A) 06/06/2020 0204   GLUCOSEU 500 (A) 01/17/2020 1442   HGBUR LARGE (A) 06/06/2020 0204   BILIRUBINUR NEGATIVE 06/06/2020 0204   KETONESUR NEGATIVE 06/06/2020 0204   PROTEINUR 30 (A) 06/06/2020 0204   UROBILINOGEN 0.2 01/17/2020 1442   NITRITE NEGATIVE 06/06/2020 0204   LEUKOCYTESUR NEGATIVE 06/06/2020 0204    Radiological Exams on Admission: CT Renal Stone Study  Result Date: 06/06/2020 CLINICAL DATA:  Right-sided pain EXAM: CT ABDOMEN AND PELVIS WITHOUT CONTRAST TECHNIQUE: Multidetector CT imaging of the abdomen and pelvis was performed following the standard protocol without IV contrast. COMPARISON:  02/08/2020 FINDINGS: Lower chest: No acute abnormality. Hepatobiliary: No solid liver abnormality is seen. No gallstones, gallbladder wall thickening, or biliary dilatation. Pancreas: Unremarkable. No pancreatic ductal dilatation or surrounding inflammatory changes. Spleen: Normal in size without significant abnormality. Adrenals/Urinary Tract: Adrenal glands are unremarkable. There are at least 3 small calculi present in the middle third of the right ureter with moderate associated hydronephrosis and hydroureter. The largest and most distal calculus measures 7 mm, the additional calculi are tiny measuring no greater than 2-3 mm (series 6, image 77). There are multiple additional tiny renal calculi bilaterally. Fat stranding about the right kidney. Bladder is unremarkable. Stomach/Bowel: Stomach is within normal limits. Appendix appears normal. No evidence of bowel wall thickening, distention, or inflammatory changes. Vascular/Lymphatic: Scattered aortic  atherosclerosis. No enlarged abdominal or pelvic lymph nodes. Reproductive: Mild prostatomegaly. Other: No abdominal wall hernia or abnormality. No abdominopelvic ascites. Musculoskeletal: No acute or significant osseous findings. IMPRESSION: 1. There are at least 3 small calculi present in the middle third of the right ureter with moderate associated hydronephrosis and hydroureter. The largest and most distal calculus measures 7 mm, the additional calculi are tiny measuring no greater than 2-3 mm. 2. There are multiple additional tiny renal calculi bilaterally. 3. Mild prostatomegaly. 4. Aortic Atherosclerosis (ICD10-I70.0). Electronically Signed  By: Eddie Candle M.D.   On: 06/06/2020 13:41    EKG: None  Assessment/Plan Active Problems:   Renal stones   Renal stone  (please populate well all problems here in Problem List. (For example, if patient is on BP meds at home and you resume or decide to hold them, it is a problem that needs to be her. Same for CAD, COPD, HLD and so on)  Obstructive uropathy with right-sided hydronephrosis and hydroureter, secondary to right ureteral stone -Urology on board, plan for stenting -No symptoms signs of UTI, monitor off antibiotics -Pain control and continue Flomax  AKI on CKD stage III -Likely from obstructive uropathy, nature of recurrent kidney stone unknown, patient was not diagnosed with gout before, will check uric acid level and consider start uric acid lowering medications. -Discontinue ARB -Recheck BMP tomorrow, if creatinine improves, consider Lasix for peripheral edema -Start bicarb p.o.  IDDM with hyperglycemia -Change to sliding scale given fluctuation of kidney functions  Hypertension -Continue amlodipine, stop ARB, consider Lasix -Add as needed hydralazine   Hx of PE -Hold Coumadin for today, start heparin subcu for DVT prophylaxis, resume Coumadin after urology procedure done.  DVT prophylaxis: Heparin subcu Code Status: Full  code Family Communication: None at bedside Disposition Plan: Likely can go home within 24 hours after urology procedure done and BMP/kidney function stable/improved Consults called: Urology Admission status: MedSurg observation   Lequita Halt MD [facility] Pager [phone]  06/06/2020, 4:34 PM

## 2020-06-06 NOTE — ED Provider Notes (Signed)
[facility] EMERGENCY DEPARTMENT Provider Note   CSN: [id number] Arrival date & time: 06/06/20  0137     History Chief Complaint  Patient presents with  . Abdominal Pain    [name] is a 64 y.o. male.  Patient is a 64 year old gentleman who has past medical history of diabetes, hypertension, kidney stones, BPH, CKD presenting to the emergency department for right lower quadrant pain.  This came on suddenly last night.  He reports a history of kidney stones on the left and this feels similar.  Reports decreased urine stream.  Denies hematuria, fever, chills, nausea, vomiting.  He reports several kidney stones in the past which he was able to pass on its own.        Past Medical History:  Diagnosis Date  . Arthritis   . Diabetes mellitus without complication ([place])   . History of pulmonary embolus (PE)   . Hypertension   . Kidney stones   . Lipidemia   . Prostate enlargement     Patient Active Problem List   Diagnosis Date Noted  . Renal stone 06/06/2020  . Type 2 diabetes mellitus with stage 3b chronic kidney disease, with long-term current use of insulin ([place]) 05/26/2020  . Visual disturbance 03/30/2020  . Chronic pain of left knee 03/30/2020  . Snores 03/30/2020  . Chronic kidney disease, stage 3b 03/30/2020  . Midline low back pain without sciatica 03/30/2020  . Hospital discharge follow-up 02/25/2020  . Type 2 diabetes mellitus with diabetic polyneuropathy, with long-term current use of insulin ([place]) 02/23/2020  . Syncope 02/08/2020  . Renal stones 01/30/2020  . Diminished pulses in lower extremity 01/26/2020  . AKI (acute kidney injury) ([place]) 01/26/2020  . CKD (chronic kidney disease) stage 4, GFR 15-29 ml/min ([facility]) 01/05/2020  . Hyperkalemia 01/05/2020  . Long term (current) use of anticoagulants 12/22/2019  . Elevated cholesterol 12/17/2019  . Primary osteoarthritis of left knee 12/17/2019  . Arthritis 12/17/2019  . Type 2 diabetes  mellitus with hyperglycemia, with long-term current use of insulin ([place]) 12/17/2019  . History of pulmonary embolism 12/17/2019  . Essential hypertension 12/17/2019    History reviewed. No pertinent surgical history.     Family History  Problem Relation Age of Onset  . Cancer Mother   . Heart disease Father     Social History   Tobacco Use  . Smoking status: Never Smoker  . Smokeless tobacco: Never Used  Vaping Use  . Vaping Use: Never used  Substance Use Topics  . Alcohol use: Yes    Alcohol/week: 1.0 standard drink    Types: 1 Cans of beer per week    Comment: social  . Drug use: Never    Home Medications Prior to Admission medications   Medication Sig Start Date End Date Taking? Authorizing Provider  acetaminophen (TYLENOL 8 HOUR ARTHRITIS PAIN) 650 MG CR tablet Take 650 mg by mouth every 8 (eight) hours as needed for pain.   Yes [provider]  amLODipine (NORVASC) 10 MG tablet Take 1 tablet (10 mg total) by mouth daily. 02/18/20 06/06/21 Yes Elouise Munroe, MD  aspirin EC 81 MG tablet Take 81 mg by mouth daily.   Yes [provider]  cilostazol (PLETAL) 100 MG tablet Take 100 mg by mouth 2 (two) times daily.   Yes [provider]  gabapentin (NEURONTIN) 300 MG capsule Take 300 mg by mouth 3 (three) times daily as needed (pain).    Yes [provider]  insulin NPH-regular Human (HUMULIN 70/30) (70-30) 100 UNIT/ML injection Inject 46 Units into the skin daily with breakfast AND 56 Units daily with supper. Patient taking differently: Inject 30-50 Units into the skin daily with breakfast AND 50 Units daily with supper. 05/26/20  Yes Shamleffer, Melanie Crazier, MD  losartan (COZAAR) 50 MG tablet TAKE 1 TABLET BY MOUTH EVERY DAY Patient taking differently: Take 50 mg by mouth daily.  05/26/20  Yes [name], MD  lovastatin (MEVACOR) 40 MG tablet Take 1 tablet (40 mg total) by mouth at bedtime. 04/07/20  Yes [name], MD  methocarbamol (ROBAXIN) 500 MG tablet Take 1 tablet (500 mg total) by mouth every 8 (eight) hours as needed for muscle spasms. 03/30/20  Yes [name], MD  montelukast (SINGULAIR) 10 MG tablet TAKE 1 TABLET BY MOUTH EVERY DAY Patient taking differently: Take 10 mg by mouth at bedtime.  05/19/20  Yes [name], MD  traMADol (ULTRAM) 50 MG tablet Take 1 tablet (50 mg total) by mouth every 6 (six) hours as needed for moderate pain. 03/30/20  Yes [name], MD  warfarin (COUMADIN) 5 MG tablet TAKE 1 TABLET (5 MG TOTAL) BY MOUTH DAILY. TAKE AS DIRECTED. Patient taking differently: Take 5 mg by mouth daily.  03/21/20  Yes [name], MD  Accu-Chek Softclix Lancets lancets Use as instructed to check blood sugar 3 times daily.E11.65 05/22/20   Shamleffer, Melanie Crazier, MD  Blood Glucose Monitoring Suppl (ACCU-CHEK AVIVA) device by Other route. Use as instructed    [provider]  glucose blood (ACCU-CHEK GUIDE) test strip Use to check blood sugar 3 times daily.E11.65 05/22/20   Shamleffer, Melanie Crazier, MD  Insulin Syringes, Disposable, U-100 1 ML MISC 1 Device by Does not apply route as directed. 02/23/20   Shamleffer, Melanie Crazier, MD  warfarin (COUMADIN) 6 MG tablet TAKE 1 TABLET BY MOUTH EVERY DAY Patient not taking: Reported on 06/06/2020 05/19/20   [name], MD    Allergies    Lisinopril  Review of Systems   Review of Systems  Constitutional: Negative for appetite change, chills, diaphoresis and fever.  Respiratory: Negative for cough and shortness of breath.   Cardiovascular: Negative for chest pain.  Gastrointestinal: Positive for abdominal pain. Negative for constipation, nausea, rectal pain and vomiting.  Genitourinary: Positive for decreased urine volume. Negative for difficulty urinating, discharge, flank pain and frequency.  Musculoskeletal: Negative for back pain.  Skin: Negative for rash and wound.    All other systems reviewed and are negative.   Physical Exam Updated Vital Signs BP (!) 159/84 (BP Location: Right Arm)   Pulse 98   Temp (!) 97.4 F (36.3 C) (Oral)   Resp 14   Ht 6\' 2"  (1.88 m)   Wt 125 kg   SpO2 98%   BMI 35.38 kg/m   Physical Exam Vitals and nursing note reviewed.  Constitutional:      General: He is not in acute distress.    Appearance: Normal appearance. He is well-developed. He is not ill-appearing, toxic-appearing or diaphoretic.  HENT:     Head: Normocephalic.     Mouth/Throat:     Mouth: Mucous membranes are moist.  Eyes:     Conjunctiva/sclera: Conjunctivae normal.  Cardiovascular:     Rate and Rhythm: Normal rate and regular rhythm.  Pulmonary:     Effort: Pulmonary effort is normal.  Abdominal:     General: Abdomen is flat. Bowel sounds are normal.  Palpations: Abdomen is soft.     Tenderness: There is abdominal tenderness in the right lower quadrant. There is no right CVA tenderness, left CVA tenderness, guarding or rebound.  Skin:    General: Skin is warm and dry.  Neurological:     Mental Status: He is alert.  Psychiatric:        Mood and Affect: Mood normal.     ED Results / Procedures / Treatments   Labs (all labs ordered are listed, but only abnormal results are displayed) Labs Reviewed  COMPREHENSIVE METABOLIC PANEL - Abnormal; Notable for the following components:      Result Value   CO2 16 (*)    Glucose, Bld 215 (*)    BUN 42 (*)    Creatinine, Ser 3.38 (*)    Calcium 8.2 (*)    AST 12 (*)    GFR calc non Af Amer 18 (*)    GFR calc Af Amer 21 (*)    All other components within normal limits  CBC - Abnormal; Notable for the following components:   WBC 11.1 (*)    RBC 4.03 (*)    Hemoglobin 11.7 (*)    HCT 37.0 (*)    All other components within normal limits  URINALYSIS, ROUTINE W REFLEX MICROSCOPIC - Abnormal; Notable for the following components:   Glucose, UA 150 (*)    Hgb urine dipstick LARGE (*)     Protein, ur 30 (*)    RBC / HPF >50 (*)    All other components within normal limits  CBG MONITORING, ED - Abnormal; Notable for the following components:   Glucose-Capillary 123 (*)    All other components within normal limits  URINE CULTURE  SARS CORONAVIRUS 2 BY RT PCR (HOSPITAL ORDER, [facility])  LIPASE, BLOOD  URIC ACID  BASIC METABOLIC PANEL  CBC    EKG None  Radiology CT Renal Stone Study  Result Date: 06/06/2020 CLINICAL DATA:  Right-sided pain EXAM: CT ABDOMEN AND PELVIS WITHOUT CONTRAST TECHNIQUE: Multidetector CT imaging of the abdomen and pelvis was performed following the standard protocol without IV contrast. COMPARISON:  02/08/2020 FINDINGS: Lower chest: No acute abnormality. Hepatobiliary: No solid liver abnormality is seen. No gallstones, gallbladder wall thickening, or biliary dilatation. Pancreas: Unremarkable. No pancreatic ductal dilatation or surrounding inflammatory changes. Spleen: Normal in size without significant abnormality. Adrenals/Urinary Tract: Adrenal glands are unremarkable. There are at least 3 small calculi present in the middle third of the right ureter with moderate associated hydronephrosis and hydroureter. The largest and most distal calculus measures 7 mm, the additional calculi are tiny measuring no greater than 2-3 mm (series 6, image 77). There are multiple additional tiny renal calculi bilaterally. Fat stranding about the right kidney. Bladder is unremarkable. Stomach/Bowel: Stomach is within normal limits. Appendix appears normal. No evidence of bowel wall thickening, distention, or inflammatory changes. Vascular/Lymphatic: Scattered aortic atherosclerosis. No enlarged abdominal or pelvic lymph nodes. Reproductive: Mild prostatomegaly. Other: No abdominal wall hernia or abnormality. No abdominopelvic ascites. Musculoskeletal: No acute or significant osseous findings. IMPRESSION: 1. There are at least 3 small calculi present  in the middle third of the right ureter with moderate associated hydronephrosis and hydroureter. The largest and most distal calculus measures 7 mm, the additional calculi are tiny measuring no greater than 2-3 mm. 2. There are multiple additional tiny renal calculi bilaterally. 3. Mild prostatomegaly. 4. Aortic Atherosclerosis (ICD10-I70.0). Electronically Signed   By: Dorna Bloom.D.  On: 06/06/2020 13:41    Procedures Procedures (including critical care time)  Medications Ordered in ED Medications  sodium chloride flush (NS) 0.9 % injection 3 mL (3 mLs Intravenous Not Given 06/06/20 0956)  acetaminophen (TYLENOL) tablet 650 mg (has no administration in time range)  aspirin EC tablet 81 mg (has no administration in time range)  amLODipine (NORVASC) tablet 10 mg (has no administration in time range)  pravastatin (PRAVACHOL) tablet 40 mg (has no administration in time range)  cilostazol (PLETAL) tablet 100 mg (has no administration in time range)  gabapentin (NEURONTIN) capsule 300 mg (has no administration in time range)  methocarbamol (ROBAXIN) tablet 500 mg (has no administration in time range)  montelukast (SINGULAIR) tablet 10 mg (has no administration in time range)  heparin injection 5,000 Units (has no administration in time range)  oxyCODONE (Oxy IR/ROXICODONE) immediate release tablet 5 mg (has no administration in time range)  insulin aspart (novoLOG) injection 0-15 Units (has no administration in time range)  insulin aspart (novoLOG) injection 0-5 Units (has no administration in time range)  hydrALAZINE (APRESOLINE) tablet 25 mg (has no administration in time range)  sodium bicarbonate tablet 650 mg (has no administration in time range)  sodium chloride 0.9 % bolus 1,000 mL (0 mLs Intravenous Stopped 06/06/20 1429)  morphine 4 MG/ML injection 4 mg (4 mg Intravenous Given 06/06/20 0954)  ondansetron (ZOFRAN) injection 4 mg (4 mg Intravenous Given 06/06/20 0954)  tamsulosin (FLOMAX)  capsule 0.4 mg (0.4 mg Oral Given 06/06/20 1654)  morphine 4 MG/ML injection 4 mg (4 mg Intravenous Given 06/06/20 1654)    ED Course  I have reviewed the triage vital signs and the nursing notes.  Pertinent labs & imaging results that were available during my care of the patient were reviewed by me and considered in my medical decision making (see chart for details).  Clinical Course as of Jun 06 1709  Tue Jun 06, 2020  1036 Patient presenting with right-sided lower abdominal pain with history of kidney stones in the past.  Similar presentation in the past.  Reports decreased urine stream and does have large hemoglobin in his urine.  CT renal study was ordered.  He has CKD at baseline but seems to be worse today with a GFR of 18 and a creatinine of 3.38.  Urine does not appear to be infected and patient is afebrile and stable at this time.  He is receiving morphine, Zofran and fluids.   [KM]  [id number] 64 year old male complaining of some right-sided abdominal pain similar to kidney stones in the past. Abdomen is slightly distended but nontender. Labs showing some hematuria and worsening creatinine. Getting a CT KUB. Disposition per results of testing.   [MB]  63 Spoke with Dr. Gloriann Loan who would not admit the patient for probable stenting of the right ureter due to multiple stones in the right ureter, 1 of which is 7 mm.  Patient's pain is controlled.  He is n.p.o.  Spoke with Dr. Roosevelt Locks who will admit the patient to hospitalist service. Patient agrees with plan   [KM]    Clinical Course User Index [KM] Alveria Apley, PA-C [MB] Hayden Rasmussen, MD   MDM Rules/Calculators/A&P                          The patient appears reasonably stabilized for admission considering the current resources, flow, and capabilities available in the ED at this time, and I doubt any other  [place] requiring further screening and/or treatment in the ED prior to admission.   Final Clinical Impression(s) / ED Diagnoses Final  diagnoses:  Kidney stone  AKI (acute kidney injury) [facility])    Rx / DC Orders ED Discharge Orders    None       Kristine Royal 06/06/20 1710    Hayden Rasmussen, MD 06/06/20 [phone]

## 2020-06-07 ENCOUNTER — Inpatient Hospital Stay (HOSPITAL_COMMUNITY): Payer: Medicare Other | Admitting: Anesthesiology

## 2020-06-07 ENCOUNTER — Other Ambulatory Visit: Payer: Self-pay

## 2020-06-07 ENCOUNTER — Observation Stay (HOSPITAL_COMMUNITY): Payer: Medicare Other

## 2020-06-07 ENCOUNTER — Encounter (HOSPITAL_COMMUNITY): Payer: Self-pay | Admitting: Internal Medicine

## 2020-06-07 ENCOUNTER — Encounter (HOSPITAL_COMMUNITY)
Admission: EM | Disposition: A | Discharge status: Home / Self Care | Payer: Self-pay | Source: Home / Self Care | Attending: Emergency Medicine

## 2020-06-07 DIAGNOSIS — N200 Calculus of kidney: Secondary | ICD-10-CM | POA: Diagnosis not present

## 2020-06-07 DIAGNOSIS — I129 Hypertensive chronic kidney disease with stage 1 through stage 4 chronic kidney disease, or unspecified chronic kidney disease: Secondary | ICD-10-CM | POA: Diagnosis not present

## 2020-06-07 DIAGNOSIS — N183 Chronic kidney disease, stage 3 unspecified: Secondary | ICD-10-CM | POA: Diagnosis not present

## 2020-06-07 DIAGNOSIS — E1165 Type 2 diabetes mellitus with hyperglycemia: Secondary | ICD-10-CM | POA: Diagnosis not present

## 2020-06-07 HISTORY — PX: CYSTOSCOPY/URETEROSCOPY/HOLMIUM LASER/STENT PLACEMENT: SHX6546

## 2020-06-07 LAB — SURGICAL PCR SCREEN
MRSA, PCR: NEGATIVE
Staphylococcus aureus: POSITIVE — AB

## 2020-06-07 LAB — BASIC METABOLIC PANEL
Anion gap: 9 (ref 5–15)
BUN: 42 mg/dL — ABNORMAL HIGH (ref 8–23)
CO2: 19 mmol/L — ABNORMAL LOW (ref 22–32)
Calcium: 8.2 mg/dL — ABNORMAL LOW (ref 8.9–10.3)
Chloride: 108 mmol/L (ref 98–111)
Creatinine, Ser: 3.34 mg/dL — ABNORMAL HIGH (ref 0.61–1.24)
GFR calc Af Amer: 21 mL/min — ABNORMAL LOW (ref >60)
GFR calc non Af Amer: 18 mL/min — ABNORMAL LOW (ref >60)
Glucose, Bld: 200 mg/dL — ABNORMAL HIGH (ref 70–99)
Potassium: 5.2 mmol/L — ABNORMAL HIGH (ref 3.5–5.1)
Sodium: 136 mmol/L (ref 135–145)

## 2020-06-07 LAB — CBC
HCT: 32.1 % — ABNORMAL LOW (ref 39.0–52.0)
Hemoglobin: 10.4 g/dL — ABNORMAL LOW (ref 13.0–17.0)
MCH: 29.2 pg (ref 26.0–34.0)
MCHC: 32.4 g/dL (ref 30.0–36.0)
MCV: 90.2 fL (ref 80.0–100.0)
Platelets: 170 10*3/uL (ref 150–400)
RBC: 3.56 MIL/uL — ABNORMAL LOW (ref 4.22–5.81)
RDW: 13.2 % (ref 11.5–15.5)
WBC: 8.5 10*3/uL (ref 4.0–10.5)
nRBC: 0 % (ref 0.0–0.2)

## 2020-06-07 LAB — GLUCOSE, CAPILLARY
Glucose-Capillary: 147 mg/dL — ABNORMAL HIGH (ref 70–99)
Glucose-Capillary: 115 mg/dL — ABNORMAL HIGH (ref 70–99)
Glucose-Capillary: 126 mg/dL — ABNORMAL HIGH (ref 70–99)
Glucose-Capillary: 129 mg/dL — ABNORMAL HIGH (ref 70–99)
Glucose-Capillary: 164 mg/dL — ABNORMAL HIGH (ref 70–99)

## 2020-06-07 LAB — URINE CULTURE: Culture: <10000 — AB

## 2020-06-07 SURGERY — CYSTOSCOPY/URETEROSCOPY/HOLMIUM LASER/STENT PLACEMENT
Anesthesia: General | Site: Ureter | Laterality: Right

## 2020-06-07 MED ORDER — PHENYLEPHRINE HCL-NACL 10-0.9 MG/250ML-% IV SOLN
INTRAVENOUS | Status: DC | PRN
  Administered 2020-06-07: 50 ug/min via INTRAVENOUS

## 2020-06-07 MED ORDER — PNEUMOCOCCAL VAC POLYVALENT 25 MCG/0.5ML IJ INJ
0.5000 mL | INJECTION | INTRAMUSCULAR | Status: AC
  Administered 2020-06-08: 0.5 mL via INTRAMUSCULAR
  Filled 2020-06-07: qty 0.5

## 2020-06-07 MED ORDER — CEFAZOLIN SODIUM-DEXTROSE 2-4 GM/100ML-% IV SOLN
2.0000 g | Freq: Once | INTRAVENOUS | Status: AC
  Administered 2020-06-07: 2 g via INTRAVENOUS

## 2020-06-07 MED ORDER — MUPIROCIN 2 % EX OINT
TOPICAL_OINTMENT | Freq: Two times a day (BID) | CUTANEOUS | Status: DC
  Filled 2020-06-07: qty 22

## 2020-06-07 MED ORDER — CHLORHEXIDINE GLUCONATE 0.12 % MT SOLN
15.0000 mL | Freq: Once | OROMUCOSAL | Status: AC
  Administered 2020-06-07: 15 mL via OROMUCOSAL

## 2020-06-07 MED ORDER — OXYCODONE HCL 5 MG PO TABS: 5.0000 mg | ORAL_TABLET | Freq: Once | ORAL | Status: DC | PRN

## 2020-06-07 MED ORDER — DEXAMETHASONE SODIUM PHOSPHATE 4 MG/ML IJ SOLN
INTRAMUSCULAR | Status: DC | PRN
Start: 2020-06-07 — End: 2020-06-07
  Administered 2020-06-07: 8 mg via INTRAVENOUS

## 2020-06-07 MED ORDER — FENTANYL CITRATE (PF) 100 MCG/2ML IJ SOLN: 25.0000 ug | INTRAMUSCULAR | Status: DC | PRN

## 2020-06-07 MED ORDER — ONDANSETRON HCL 4 MG/2ML IJ SOLN: 4.0000 mg | Freq: Once | INTRAMUSCULAR | Status: DC | PRN

## 2020-06-07 MED ORDER — CEFAZOLIN SODIUM-DEXTROSE 2-4 GM/100ML-% IV SOLN
INTRAVENOUS | Status: AC
  Filled 2020-06-07: qty 100

## 2020-06-07 MED ORDER — FENTANYL CITRATE (PF) 250 MCG/5ML IJ SOLN
INTRAMUSCULAR | Status: AC
  Filled 2020-06-07: qty 5

## 2020-06-07 MED ORDER — OXYCODONE HCL 5 MG/5ML PO SOLN: 5.0000 mg | Freq: Once | ORAL | Status: DC | PRN

## 2020-06-07 MED ORDER — IOHEXOL 300 MG/ML  SOLN
INTRAMUSCULAR | Status: DC | PRN
  Administered 2020-06-07: 9 mL via URETHRAL

## 2020-06-07 MED ORDER — AMISULPRIDE (ANTIEMETIC) 5 MG/2ML IV SOLN: 10.0000 mg | Freq: Once | INTRAVENOUS | Status: DC | PRN

## 2020-06-07 MED ORDER — SODIUM CHLORIDE 0.9 % IV SOLN: INTRAVENOUS | Status: DC | PRN

## 2020-06-07 MED ORDER — PROPOFOL 10 MG/ML IV BOLUS
INTRAVENOUS | Status: AC
  Filled 2020-06-07: qty 20

## 2020-06-07 MED ORDER — MIDAZOLAM HCL 2 MG/2ML IJ SOLN
INTRAMUSCULAR | Status: AC
  Filled 2020-06-07: qty 2

## 2020-06-07 MED ORDER — ONDANSETRON HCL 4 MG/2ML IJ SOLN
INTRAMUSCULAR | Status: DC | PRN
  Administered 2020-06-07: 4 mg via INTRAVENOUS

## 2020-06-07 MED ORDER — LIDOCAINE 2% (20 MG/ML) 5 ML SYRINGE
INTRAMUSCULAR | Status: AC
  Filled 2020-06-07: qty 5

## 2020-06-07 MED ORDER — FENTANYL CITRATE (PF) 100 MCG/2ML IJ SOLN
INTRAMUSCULAR | Status: DC | PRN
  Administered 2020-06-07: 50 ug via INTRAVENOUS
  Administered 2020-06-07: 25 ug via INTRAVENOUS
  Administered 2020-06-07: 50 ug via INTRAVENOUS

## 2020-06-07 MED ORDER — EPHEDRINE 5 MG/ML INJ
INTRAVENOUS | Status: AC
  Filled 2020-06-07: qty 10

## 2020-06-07 MED ORDER — ONDANSETRON HCL 4 MG/2ML IJ SOLN
INTRAMUSCULAR | Status: AC
  Filled 2020-06-07: qty 2

## 2020-06-07 MED ORDER — LACTATED RINGERS IV SOLN: INTRAVENOUS | Status: DC

## 2020-06-07 MED ORDER — PHENYLEPHRINE 40 MCG/ML (10ML) SYRINGE FOR IV PUSH (FOR BLOOD PRESSURE SUPPORT)
PREFILLED_SYRINGE | INTRAVENOUS | Status: AC
  Filled 2020-06-07: qty 10

## 2020-06-07 MED ORDER — PHENYLEPHRINE HCL (PRESSORS) 10 MG/ML IV SOLN
INTRAVENOUS | Status: DC | PRN
Start: 2020-06-07 — End: 2020-06-07
  Administered 2020-06-07 (×2): 80 ug via INTRAVENOUS
  Administered 2020-06-07: 60 ug via INTRAVENOUS
  Administered 2020-06-07: 40 ug via INTRAVENOUS

## 2020-06-07 MED ORDER — PROPOFOL 10 MG/ML IV BOLUS
INTRAVENOUS | Status: DC | PRN
  Administered 2020-06-07: 200 mg via INTRAVENOUS

## 2020-06-07 MED ORDER — LIDOCAINE HCL 1 % IJ SOLN
INTRAMUSCULAR | Status: DC | PRN
  Administered 2020-06-07: 40 mg via INTRADERMAL

## 2020-06-07 MED ORDER — EPHEDRINE SULFATE 50 MG/ML IJ SOLN
INTRAMUSCULAR | Status: DC | PRN
Start: 2020-06-07 — End: 2020-06-07
  Administered 2020-06-07: 10 mg via INTRAVENOUS

## 2020-06-07 SURGICAL SUPPLY — 22 items
BAG URO CATCHER STRL LF (MISCELLANEOUS) ×3 IMPLANT
BASKET LASER NITINOL 1.9FR (BASKET) IMPLANT
BASKET ZERO TIP NITINOL 2.4FR (BASKET) IMPLANT
CATH INTERMIT  6FR 70CM (CATHETERS) ×3 IMPLANT
CLOTH BEACON ORANGE TIMEOUT ST (SAFETY) ×3 IMPLANT
EXTRACTOR STONE 1.7FRX115CM (UROLOGICAL SUPPLIES) IMPLANT
GLOVE BIO SURGEON STRL SZ7.5 (GLOVE) ×3 IMPLANT
GOWN STRL REUS W/TWL XL LVL3 (GOWN DISPOSABLE) ×3 IMPLANT
GUIDEWIRE ANG ZIPWIRE 038X150 (WIRE) ×4 IMPLANT
GUIDEWIRE STR DUAL SENSOR (WIRE) ×3 IMPLANT
KIT TURNOVER KIT A (KITS) IMPLANT
LASER FIB FLEXIVA PULSE ID 365 (Laser) IMPLANT
MANIFOLD NEPTUNE II (INSTRUMENTS) ×3 IMPLANT
PACK CYSTO (CUSTOM PROCEDURE TRAY) ×3 IMPLANT
SHEATH URETERAL 12FRX28CM (UROLOGICAL SUPPLIES) IMPLANT
SHEATH URETERAL 12FRX35CM (MISCELLANEOUS) ×2 IMPLANT
STENT URET 6FRX26 CONTOUR (STENTS) ×2 IMPLANT
TRACTIP FLEXIVA PULS ID 200XHI (Laser) IMPLANT
TRACTIP FLEXIVA PULSE ID 200 (Laser) ×2
TUBING CONNECTING 10 (TUBING) ×2 IMPLANT
TUBING CONNECTING 10' (TUBING) ×1
TUBING UROLOGY SET (TUBING) ×3 IMPLANT

## 2020-06-07 NOTE — Interval H&P Note (Signed)
History and Physical Interval Note:  06/07/2020 4:38 PM  [name]  has presented today for surgery, with the diagnosis of right renal stone and renal insufficency.  The various methods of treatment have been discussed with the patient and family. After consideration of risks, benefits and other options for treatment, the patient has consented to  Procedure(s): CYSTOSCOPY/URETEROSCOPY/HOLMIUM LASER/STENT PLACEMENT (Right) as a surgical intervention.  The patient's history has been reviewed, patient examined, no change in status, stable for surgery.  I have reviewed the patient's chart and labs.  Questions were answered to the patient's satisfaction.     Marton Redwood, III

## 2020-06-07 NOTE — Transfer of Care (Signed)
Immediate Anesthesia Transfer of Care Note  Patient: [name]  Procedure(s) Performed: CYSTOSCOPY/URETEROSCOPY/HOLMIUM LASER/STENT PLACEMENT (Right Ureter)  Patient Location: PACU  Anesthesia Type:General  Level of Consciousness: awake, alert , oriented and patient cooperative  Airway & Oxygen Therapy: Patient Spontanous Breathing and Patient connected to face mask oxygen  Post-op Assessment: Report given to RN and Post -op Vital signs reviewed and stable  Post vital signs: Reviewed and stable  Last Vitals:  Vitals Value Taken Time  BP 145/80 06/07/20 1801  Temp    Pulse 104 06/07/20 1802  Resp 17 06/07/20 1802  SpO2 99 % 06/07/20 1802  Vitals shown include unvalidated device data.  Last Pain:  Vitals:   06/07/20 1509  TempSrc: Oral  PainSc:          Complications: No complications documented.

## 2020-06-07 NOTE — Anesthesia Preprocedure Evaluation (Addendum)
Anesthesia Evaluation  Patient identified by MRN, date of birth, ID band Patient awake    Reviewed: Allergy & Precautions, NPO status , Patient's Chart, lab work & pertinent test results  Airway Mallampati: II  TM Distance: >3 FB Neck ROM: Full    Dental  (+) Poor Dentition, Missing, Chipped, Dental Advisory Given   Pulmonary PE PE 02/09/20   Pulmonary exam normal        Cardiovascular hypertension, Pt. on medications Normal cardiovascular exam     Neuro/Psych Diabetic peripheral neuropathy negative psych ROS   GI/Hepatic negative GI ROS, Neg liver ROS,   Endo/Other  diabetes, Poorly Controlled, Type 2, Oral Hypoglycemic Agents, Insulin DependentHyperlipidemia Obesity  Renal/GU Renal InsufficiencyRenal diseaseRight renal calculs  negative genitourinary   Musculoskeletal  (+) Arthritis , Osteoarthritis,    Abdominal (+) - obese,   Peds  Hematology negative hematology ROS (+) Chronic anticoagulation- Coumadin and Pletal Last dose 7/29/21Last INR 4/3 on 7/29   Anesthesia Other Findings   Reproductive/Obstetrics                           Anesthesia Physical Anesthesia Plan  ASA: III  Anesthesia Plan: General   Post-op Pain Management:  Regional for Post-op pain   Induction: Intravenous  PONV Risk Score and Plan: 4 or greater and Ondansetron, Treatment may vary due to age or medical condition and Dexamethasone  Airway Management Planned: LMA  Additional Equipment: None  Intra-op Plan:   Post-operative Plan: Extubation in OR  Informed Consent: I have reviewed the patients History and Physical, chart, labs and discussed the procedure including the risks, benefits and alternatives for the proposed anesthesia with the patient or authorized representative who has indicated his/her understanding and acceptance.     Dental advisory given  Plan Discussed with:   Anesthesia Plan  Comments:       Anesthesia Quick Evaluation

## 2020-06-07 NOTE — Progress Notes (Signed)
Pt arrived to 5N @ 1910. VSS, pt denying any pain.

## 2020-06-07 NOTE — Op Note (Signed)
Operative Note  Preoperative diagnosis:  1.  Right ureteral calculus with acute renal insufficiency on top of CKD stage III  Postoperative diagnosis: 1.  Same  Procedure(s): 1.  Cystoscopy with right retrograde pyelogram, right ureteroscopy with laser lithotripsy, ureteral stent placement  Surgeon: Link Snuffer, MD  Assistants: None  Anesthesia: General  Complications: None immediate  EBL: Minimal  Specimens: 1.  None  Drains/Catheters: 1.  6 x 26 double-J ureteral stent  Intraoperative findings: 1.  Normal anterior urethra 2.  Borderline obstructing prostate 3.  Normal bladder mucosa 4.  Approximately 7 mm proximal ureteral calculus fragmented then pushed into the kidney followed by complete fragmentation of all renal stone fragments.  There were multiple balls of what appeared to be matrix.  All stone appeared to be fragmented to tiny fragments.  No clinically significant stone fragments were obvious.  Retrograde pyelogram revealed some mild hydronephrosis.  Indication: 64 year old male with history of nephrolithiasis presented with right-sided flank pain.  He was found to have a 7 mm proximal ureteral calculus.  He also had acute renal insufficiency.  Therefore, decision was made to proceed with the above operation.  Description of procedure:  The patient was identified and consent was obtained.  The patient was taken to the operating room and placed in the supine position.  The patient was placed under general anesthesia.  Perioperative antibiotics were administered.  The patient was placed in dorsal lithotomy.  Patient was prepped and draped in a standard sterile fashion and a timeout was performed.  A 21 French rigid cystoscope was advanced into the urethra and into the bladder.  Complete cystoscopy was performed with the findings noted above.  The right ureter was cannulated with a sensor wire which was advanced up to the kidney under fluoroscopic guidance.  A semirigid  ureteroscope was advanced alongside the wire up the ureter up to the stone of interest.  This was fragmented to smaller fragments and then retropulsed into the kidney.  A second wire was placed through the scope and the scope withdrawn.  Under continuous fluoroscopic guidance, a 12 x 14 ureteral access sheath was advanced up the ureter.  The inner sheath and the wire were withdrawn.  Flexible ureteroscopy was performed and all stone fragments that were encountered were laser fragmented to tiny fragments.  No clinically significant stone fragments were obvious.  Also lasered some matrix.  I then shot a retrograde pyelogram through the scope with the findings noted above.  I then withdrew the scope along with the access sheath visualizing the entire ureter upon removal.  There were no clinically significant stone fragments seen and no obvious ureteral trauma.  I backloaded the wire onto the rigid cystoscope and advanced that into the bladder followed by routine placement of a 6 x 26 double-J ureteral stent.  Fluoroscopy confirmed proximal placement and direct visualization confirmed a good coil within the bladder.  I drained the bladder and withdrew the scope.  Patient tolerated the procedure well and was stable postoperatively.  Plan: Follow-up in 1 week for stent removal.  Keep overnight to ensure renal function improvement

## 2020-06-07 NOTE — Progress Notes (Signed)
Patient transferred to [facility] for procedure via [facility] at 1434 on 06/07/20.  Belongings left in room per patient request.

## 2020-06-07 NOTE — Plan of Care (Signed)

## 2020-06-07 NOTE — Anesthesia Procedure Notes (Signed)
Procedure Name: LMA Insertion Date/Time: 06/07/2020 5:03 PM Performed by: Garrel Ridgel, CRNA Pre-anesthesia Checklist: Patient identified, Emergency Drugs available, Suction available, Patient being monitored and Timeout performed Patient Re-evaluated:Patient Re-evaluated prior to induction Oxygen Delivery Method: Circle system utilized Preoxygenation: Pre-oxygenation with 100% oxygen Induction Type: IV induction Ventilation: Mask ventilation without difficulty LMA: LMA inserted LMA Size: 4.0 Number of attempts: 1 Placement Confirmation: positive ETCO2 Tube secured with: Tape Dental Injury: Teeth and Oropharynx as per pre-operative assessment

## 2020-06-07 NOTE — Discharge Instructions (Signed)

## 2020-06-07 NOTE — Progress Notes (Signed)
PROGRESS NOTE    [name]  WVP:[id number] DOB: [birth year] DOA: 06/06/2020 PCP: [name], MD   Brief Narrative:  [name] is a 64 y.o. male with medical history significant of kidney stones, CKD stage III, IDDM, hypertension, PE on Coumadin, BPH, presented with new onset of right sided flank pain.  Symptoms started yesterday evening, sharp like, she took some Tylenol without significant improvement.  Overnight the pain radiated down to right-sided groin area.,  He denies any fever chills, no dysuria no diarrhea.  And no nauseous or vomit no chest pain or short of breath.  Patient reported he has chronic kidney stone problems, see past multiple small stones in April of this year.  Also has had worsening of kidney functions along with worsening of peripheral edema mainly on the right side leg and right ankle and right toes.  He denies any history of gout, but he does admit his father has gout. ED Course: CT abdomen showed 3 small stones in the mid third of the right ureter associated with hydronephrosis and hydroureter, with the largest one measuring 7 mm.  Multiple additional small kidney stones bilaterally.  WBC 11.1, creatinine 3.3, CO2 16, potassium 5.0 Assessment & Plan:   Active Problems:   Renal stones   Renal stone   Obstructive uropathy with right-sided hydronephrosis and hydroureter, secondary to right ureteral stone -Urology on board, plan for stenting at [facility] later today -No symptoms signs of UTI, monitor off antibiotics -Pain control and continue Flomax  AKI on CKD stage III, stable -Likely from obstructive uropathy, nature of recurrent kidney stone unknown -Urology hopefully able to determine stone type - Urin acid WNL Lab Results  Component Value Date   CREATININE 3.34 (H) 06/07/2020   CREATININE 3.38 (H) 06/06/2020   CREATININE 1.95 (H) 03/30/2020   IDDM with hyperglycemia -Change to sliding scale given fluctuation of kidney  functions  Hypertension -Continue amlodipine, stop ARB, consider Lasix -Add as needed hydralazine  Hx of PE -Hold Coumadin for today, start heparin subcu for DVT prophylaxis, resume Coumadin after urology procedure done.  DVT prophylaxis: Heparin subcu Code Status: Full code Family Communication: None present  Status is: Inpatient  Dispo: The patient is from: Home              Anticipated d/c is to: Home              Anticipated d/c date is: 24 to 48 hours pending clinical course              Patient currently not medically stable for discharge due to ongoing need for urological procedure, close monitoring, IV fluids in the setting of AKI with severe urinary obstruction.  Consultants:   Urology  Procedures:   Pending  Antimicrobials:  Per urology periprocedure  Subjective: No acute issues or events overnight, pain currently well controlled, denies nausea, vomiting, diarrhea, constipation, headache, fevers, chills.  Objective: Vitals:   06/07/20 0044 06/07/20 0100 06/07/20 0500 06/07/20 0735  BP: 136/68  138/62 121/62  Pulse: (!) 108 (!) 105 (!) 106 99  Resp: 18  17 17   Temp: 97.8 F (36.6 C)  98 F (36.7 C) 98.7 F (37.1 C)  TempSrc: Oral  Oral Oral  SpO2: 98%  100% 97%  Weight:      Height:       No intake or output data in the 24 hours ending 06/07/20 0833 Filed Weights   06/06/20 0152  Weight: 125 kg  Examination:  General:  Pleasantly resting in bed, No acute distress. HEENT:  Normocephalic atraumatic.  Sclerae nonicteric, noninjected.  Extraocular movements intact bilaterally.  Poor dentition Neck:  Without mass or deformity.  Trachea is midline. Lungs:  Clear to auscultate bilaterally without rhonchi, wheeze, or rales. Heart:  Regular rate and rhythm.  Without murmurs, rubs, or gallops. Abdomen:  Soft, nontender, nondistended.  Without guarding or rebound. Extremities: Without cyanosis, clubbing, edema, or obvious deformity. Vascular:   Dorsalis pedis and posterior tibial pulses palpable bilaterally. Skin:  Warm and dry, no erythema, no ulcerations.  Data Reviewed: I have personally reviewed following labs and imaging studies  CBC: Recent Labs  Lab 06/06/20 0202 06/07/20 0319  WBC 11.1* 8.5  HGB 11.7* 10.4*  HCT 37.0* 32.1*  MCV 91.8 90.2  PLT 212 326   Basic Metabolic Panel: Recent Labs  Lab 06/06/20 0202 06/07/20 0319  NA 135 136  K 5.0 5.2*  CL 106 108  CO2 16* 19*  GLUCOSE 215* 200*  BUN 42* 42*  CREATININE 3.38* 3.34*  CALCIUM 8.2* 8.2*   GFR: Estimated Creatinine Clearance: 30.6 mL/min (A) (by C-G formula based on SCr of 3.34 mg/dL (H)). Liver Function Tests: Recent Labs  Lab 06/06/20 0202  AST 12*  ALT 15  ALKPHOS 63  BILITOT 0.9  PROT 7.0  ALBUMIN 3.6   Recent Labs  Lab 06/06/20 0202  LIPASE 51   No results for input(s): AMMONIA in the last 168 hours. Coagulation Profile: Recent Labs  Lab 06/01/20 1018  INR 4.3*   Cardiac Enzymes: No results for input(s): CKTOTAL, CKMB, CKMBINDEX, TROPONINI in the last 168 hours. BNP (last 3 results) No results for input(s): PROBNP in the last 8760 hours. HbA1C: No results for input(s): HGBA1C in the last 72 hours. CBG: Recent Labs  Lab 06/06/20 1635 06/06/20 1818 06/06/20 2141 06/07/20 0646  GLUCAP 123* 119* 205* 147*   Lipid Profile: No results for input(s): CHOL, HDL, LDLCALC, TRIG, CHOLHDL, LDLDIRECT in the last 72 hours. Thyroid Function Tests: No results for input(s): TSH, T4TOTAL, FREET4, T3FREE, THYROIDAB in the last 72 hours. Anemia Panel: No results for input(s): VITAMINB12, FOLATE, FERRITIN, TIBC, IRON, RETICCTPCT in the last 72 hours. Sepsis Labs: No results for input(s): PROCALCITON, LATICACIDVEN in the last 168 hours.  Recent Results (from the past 240 hour(s))  SARS Coronavirus 2 by RT PCR (hospital order, performed in [facility] hospital lab) Nasopharyngeal Nasopharyngeal Swab     Status: None   Collection  Time: 06/06/20  5:37 PM   Specimen: Nasopharyngeal Swab  Result Value Ref Range Status   SARS Coronavirus 2 NEGATIVE NEGATIVE Final    Comment: (NOTE) SARS-CoV-2 target nucleic acids are NOT DETECTED.  The SARS-CoV-2 RNA is generally detectable in upper and lower respiratory specimens during the acute phase of infection. The lowest concentration of SARS-CoV-2 viral copies this assay can detect is 250 copies / mL. A negative result does not preclude SARS-CoV-2 infection and should not be used as the sole basis for treatment or other patient management decisions.  A negative result may occur with improper specimen collection / handling, submission of specimen other than nasopharyngeal swab, presence of viral mutation(s) within the areas targeted by this assay, and inadequate number of viral copies (<250 copies / mL). A negative result must be combined with clinical observations, patient history, and epidemiological information.  Fact Sheet for Patients:   StrictlyIdeas.no  Fact Sheet for Healthcare Providers: BankingDealers.co.za  This test is not yet approved or  cleared by the [place] and has been authorized for detection and/or diagnosis of SARS-CoV-2 by FDA under an Emergency Use Authorization (EUA).  This EUA will remain in effect (meaning this test can be used) for the duration of the COVID-19 declaration under Section 564(b)(1) of the Act, 21 U.S.C. section 360bbb-3(b)(1), unless the authorization is terminated or revoked sooner.  Performed at [facility], [place] [address] [place], [place] [address]   Surgical pcr screen     Status: Abnormal   Collection Time: 06/07/20  2:57 AM   Specimen: Nasal Mucosa; Nasal Swab  Result Value Ref Range Status   MRSA, PCR NEGATIVE NEGATIVE Final   Staphylococcus aureus POSITIVE (A) NEGATIVE Final    Comment: (NOTE) The Xpert SA Assay (FDA approved for NASAL specimens in  patients 41 years of age and older), is one component of a comprehensive surveillance program. It is not intended to diagnose infection nor to guide or monitor treatment. Performed at [facility], [place] [address] [place], [place] [address]          Radiology Studies: CT Renal Stone Study  Result Date: 06/06/2020 CLINICAL DATA:  Right-sided pain EXAM: CT ABDOMEN AND PELVIS WITHOUT CONTRAST TECHNIQUE: Multidetector CT imaging of the abdomen and pelvis was performed following the standard protocol without IV contrast. COMPARISON:  02/08/2020 FINDINGS: Lower chest: No acute abnormality. Hepatobiliary: No solid liver abnormality is seen. No gallstones, gallbladder wall thickening, or biliary dilatation. Pancreas: Unremarkable. No pancreatic ductal dilatation or surrounding inflammatory changes. Spleen: Normal in size without significant abnormality. Adrenals/Urinary Tract: Adrenal glands are unremarkable. There are at least 3 small calculi present in the middle third of the right ureter with moderate associated hydronephrosis and hydroureter. The largest and most distal calculus measures 7 mm, the additional calculi are tiny measuring no greater than 2-3 mm (series 6, image 77). There are multiple additional tiny renal calculi bilaterally. Fat stranding about the right kidney. Bladder is unremarkable. Stomach/Bowel: Stomach is within normal limits. Appendix appears normal. No evidence of bowel wall thickening, distention, or inflammatory changes. Vascular/Lymphatic: Scattered aortic atherosclerosis. No enlarged abdominal or pelvic lymph nodes. Reproductive: Mild prostatomegaly. Other: No abdominal wall hernia or abnormality. No abdominopelvic ascites. Musculoskeletal: No acute or significant osseous findings. IMPRESSION: 1. There are at least 3 small calculi present in the middle third of the right ureter with moderate associated hydronephrosis and hydroureter. The largest and most distal calculus  measures 7 mm, the additional calculi are tiny measuring no greater than 2-3 mm. 2. There are multiple additional tiny renal calculi bilaterally. 3. Mild prostatomegaly. 4. Aortic Atherosclerosis (ICD10-I70.0). Electronically Signed   By: Eddie Candle M.D.   On: 06/06/2020 13:41        Scheduled Meds: . amLODipine  10 mg Oral Daily  . aspirin EC  81 mg Oral Daily  . cilostazol  100 mg Oral BID  . heparin  5,000 Units Subcutaneous Q12H  . insulin aspart  0-15 Units Subcutaneous TID WC  . insulin aspart  0-5 Units Subcutaneous QHS  . montelukast  10 mg Oral QHS  . mupirocin ointment   Nasal BID  . pravastatin  40 mg Oral q1800  . sodium bicarbonate  650 mg Oral TID  . sodium chloride flush  3 mL Intravenous Once   Continuous Infusions:   LOS: 0 days   Time spent: 38min  Shahram Alexopoulos C Chipper Koudelka, DO [facility]  If 7PM-7AM, please contact night-coverage [email or web address]  06/07/2020, 8:33 AM

## 2020-06-07 NOTE — Anesthesia Postprocedure Evaluation (Signed)
Anesthesia Post Note  Patient: [name]  Procedure(s) Performed: CYSTOSCOPY/URETEROSCOPY/HOLMIUM LASER/STENT PLACEMENT (Right Ureter)     Patient location during evaluation: PACU Anesthesia Type: General Level of consciousness: awake and alert Pain management: pain level controlled Vital Signs Assessment: post-procedure vital signs reviewed and stable Respiratory status: spontaneous breathing, nonlabored ventilation and respiratory function stable Cardiovascular status: blood pressure returned to baseline and stable Postop Assessment: no apparent nausea or vomiting Anesthetic complications: no   No complications documented.  Last Vitals:  Vitals:   06/07/20 1830 06/07/20 1840  BP: (!) 144/82 (!) 145/81  Pulse: (!) 102 (!) 103  Resp: 14 (!) 22  Temp:  (!) 36.3 C  SpO2: 95% 97%    Last Pain:  Vitals:   06/07/20 1840  TempSrc:   PainSc: 0-No pain                 Lidia Collum

## 2020-06-07 NOTE — Plan of Care (Signed)

## 2020-06-08 ENCOUNTER — Encounter (HOSPITAL_COMMUNITY): Payer: Self-pay | Admitting: Urology

## 2020-06-08 DIAGNOSIS — N200 Calculus of kidney: Secondary | ICD-10-CM | POA: Diagnosis not present

## 2020-06-08 LAB — CBC
HCT: 33.2 % — ABNORMAL LOW (ref 39.0–52.0)
Hemoglobin: 11 g/dL — ABNORMAL LOW (ref 13.0–17.0)
MCH: 29.7 pg (ref 26.0–34.0)
MCHC: 33.1 g/dL (ref 30.0–36.0)
MCV: 89.7 fL (ref 80.0–100.0)
Platelets: 230 10*3/uL (ref 150–400)
RBC: 3.7 MIL/uL — ABNORMAL LOW (ref 4.22–5.81)
RDW: 12.7 % (ref 11.5–15.5)
WBC: 6.8 10*3/uL (ref 4.0–10.5)
nRBC: 0 % (ref 0.0–0.2)

## 2020-06-08 LAB — GLUCOSE, CAPILLARY
Glucose-Capillary: 191 mg/dL — ABNORMAL HIGH (ref 70–99)
Glucose-Capillary: 244 mg/dL — ABNORMAL HIGH (ref 70–99)

## 2020-06-08 LAB — BASIC METABOLIC PANEL
Anion gap: 13 (ref 5–15)
BUN: 48 mg/dL — ABNORMAL HIGH (ref 8–23)
CO2: 16 mmol/L — ABNORMAL LOW (ref 22–32)
Calcium: 8.5 mg/dL — ABNORMAL LOW (ref 8.9–10.3)
Chloride: 106 mmol/L (ref 98–111)
Creatinine, Ser: 3.07 mg/dL — ABNORMAL HIGH (ref 0.61–1.24)
GFR calc Af Amer: 23 mL/min — ABNORMAL LOW (ref >60)
GFR calc non Af Amer: 20 mL/min — ABNORMAL LOW (ref >60)
Glucose, Bld: 207 mg/dL — ABNORMAL HIGH (ref 70–99)
Potassium: 4.9 mmol/L (ref 3.5–5.1)
Sodium: 135 mmol/L (ref 135–145)

## 2020-06-08 MED ORDER — OXYCODONE HCL 5 MG PO TABS: 5.0000 mg | ORAL_TABLET | ORAL | 0 refills | Status: DC | PRN

## 2020-06-08 NOTE — Discharge Summary (Signed)
Physician Discharge Summary  [name] UJW:[id number] DOB: 1956-03-12 DOA: 06/06/2020  PCP: [name], MD  Admit date: 06/06/2020 Discharge date: 06/08/2020  Admitted From: Home Disposition: Home  Recommendations for Outpatient Follow-up:  1. Follow up with PCP in the next few days, urology in 1 week for stent removal 2. Please obtain BMP/CBC within 1 week  Discharge Condition: Stable CODE STATUS: Full Diet recommendation: As tolerated  Brief/Interim Summary: [name] a 64 y.o.malewith medical history significant ofkidney stones, CKD stage III, IDDM, hypertension,PE on Coumadin, BPH, presented with new onset of right sided flank pain. Symptoms started yesterday evening, sharp like, she took some Tylenol without significant improvement.Overnight the pain radiated down to right-sided groin area., He denies any fever chills, no dysuria no diarrhea. And no nauseous or vomit no chest pain or short of breath. Patient reported he has chronic kidney stone problems, see past multiple small stones in April of this year. Also has had worsening of kidney functions along with worsening of peripheral edema mainly on the right side leg and right ankle and right toes. He denies any history of gout, but he does admit his father has gout.  In the EDCT abdomen showed 3 small stones in the mid third of the right ureter associated with hydronephrosis and hydroureter, with the largest one measuring 7 mm. Multiple additional small kidney stones bilaterally. WBC 11.1, creatinine 3.3, CO2 16, potassium 5.0  Patient tolerated procedure with urology well, stone removed and stent placed without any complication.  Patient continues to advance diet, urine output improving, scant hematuria but improving since admission, and downtrending appropriately, albeit not back to baseline but markedly improving.  Given patient's advancement of p.o., well-controlled pain and removal of kidney stone he is  otherwise stable and agreeable for discharge home.  Patient will follow with PCP in the next 3 to 5 days for repeat BMP to follow creatinine, follow-up with urology in 1 week for stent removal.  Educated that if he has worsening pain, difficulty urinating or worsening hematuria he should hold his home warfarin and call his PCP.  Discharge Diagnoses:  Active Problems:   Renal stones   Renal stone   Nephrolithiasis    Discharge Instructions  Discharge Instructions    Call MD for:  extreme fatigue   Complete by: As directed    Call MD for:  persistant dizziness or light-headedness   Complete by: As directed    Call MD for:  persistant nausea and vomiting   Complete by: As directed    Call MD for:  redness, tenderness, or signs of infection (pain, swelling, redness, odor or green/yellow discharge around incision site)   Complete by: As directed    Call MD for:  severe uncontrolled pain   Complete by: As directed    Call MD for:  temperature >100.4   Complete by: As directed    Diet - low sodium heart healthy   Complete by: As directed    Increase activity slowly   Complete by: As directed      Allergies as of 06/08/2020      Reactions   Lisinopril Other (See Comments)   Patient states that this medication makes him feel "off". Tolerates losartan.      Medication List    STOP taking these medications   losartan 50 MG tablet Commonly known as: COZAAR     TAKE these medications   Accu-Chek Aviva device by Other route. Use as instructed   Accu-Chek Guide test  strip Generic drug: glucose blood Use to check blood sugar 3 times daily.E11.65   Accu-Chek Softclix Lancets lancets Use as instructed to check blood sugar 3 times daily.E11.65   amLODipine 10 MG tablet Commonly known as: NORVASC Take 1 tablet (10 mg total) by mouth daily.   aspirin EC 81 MG tablet Take 81 mg by mouth daily.   cilostazol 100 MG tablet Commonly known as: PLETAL Take 100 mg by mouth 2 (two)  times daily.   gabapentin 300 MG capsule Commonly known as: NEURONTIN Take 300 mg by mouth 3 (three) times daily as needed (pain).   HumuLIN 70/30 (70-30) 100 UNIT/ML injection Generic drug: insulin NPH-regular Human Inject 46 Units into the skin daily with breakfast AND 56 Units daily with supper. What changed: See the new instructions.   Insulin Syringes (Disposable) U-100 1 ML Misc 1 Device by Does not apply route as directed.   lovastatin 40 MG tablet Commonly known as: MEVACOR Take 1 tablet (40 mg total) by mouth at bedtime.   methocarbamol 500 MG tablet Commonly known as: Robaxin Take 1 tablet (500 mg total) by mouth every 8 (eight) hours as needed for muscle spasms.   montelukast 10 MG tablet Commonly known as: SINGULAIR TAKE 1 TABLET BY MOUTH EVERY DAY What changed: when to take this   oxyCODONE 5 MG immediate release tablet Commonly known as: Oxy IR/ROXICODONE Take 1 tablet (5 mg total) by mouth every 4 (four) hours as needed for moderate pain.   traMADol 50 MG tablet Commonly known as: ULTRAM Take 1 tablet (50 mg total) by mouth every 6 (six) hours as needed for moderate pain.   Tylenol 8 Hour Arthritis Pain 650 MG CR tablet Generic drug: acetaminophen Take 650 mg by mouth every 8 (eight) hours as needed for pain.   warfarin 5 MG tablet Commonly known as: COUMADIN Take as directed. If you are unsure how to take this medication, talk to your nurse or doctor. Original instructions: TAKE 1 TABLET (5 MG TOTAL) BY MOUTH DAILY. TAKE AS DIRECTED. What changed:   additional instructions  Another medication with the same name was removed. Continue taking this medication, and follow the directions you see here.       Allergies  Allergen Reactions  . Lisinopril Other (See Comments)    Patient states that this medication makes him feel "off". Tolerates losartan.    Consultations: Urology  Procedures/Studies: Nocturnal polysomnography  Result Date:  05/16/2020 [name] Age, MD     06/01/2020  4:49 PM PATIENT'S NAME:  [name] DOB:      02-Nov-1956     MR#:    [id number]    DATE OF RECORDING: 05/16/2020 REFERRING M.D.:  Dr. Gilmore Glass Performed:   Baseline Polysomnogram HISTORY: 64 year old man with a history of arthritis, diabetes, history of PE, hypertension, kidney stones, prostate enlargement, hyperlipidemia, and obesity, who reports snoring and excessive daytime somnolence. The patient endorsed the Epworth Sleepiness Scale at 7 points. The patient's weight 246 pounds with a height of 74 (inches), resulting in a BMI of 31.7 kg/m2. The patient's neck circumference measured 18.15 inches. CURRENT MEDICATIONS: Tylenol, Norvasc, Aspirin, Pletal, Neurontin, Cozaar, Mevacor, Robaxin, Ultram, Coumadin  PROCEDURE:  This is a multichannel digital polysomnogram utilizing the Somnostar 11.2 system.  Electrodes and sensors were applied and monitored per AASM Specifications.   EEG, EOG, Chin and Limb EMG, were sampled at 200 Hz.  ECG, Snore and Nasal Pressure, Thermal Airflow, Respiratory Effort, CPAP Flow and Pressure, Oximetry was sampled  at 50 Hz. Digital video and audio were recorded.    BASELINE STUDY Lights Out was at 22:32 and Lights On at 05:07.  Total recording time (TRT) was 395.5 minutes, with a total sleep time (TST) of 259 minutes.   The patient's sleep latency was 132.5 minutes, which is markedly delayed. REM latency was 89.5 minutes, which is normal.  The sleep efficiency was 65.5 %.   SLEEP ARCHITECTURE: WASO (Wake after sleep onset) was 3.5 minutes.  There were 3.5 minutes in Stage N1, 47 minutes Stage N2, 129.5 minutes Stage N3 and 79 minutes in Stage REM.  The percentage of Stage N1 was 1.4%, Stage N2 was 18.1%, Stage N3 was 50%, which is markedly increased, and Stage R (REM sleep) was 30.5%, which is increased. The arousals were noted as: 12 were spontaneous, 0 were associated with PLMs, 9 were associated with respiratory events. RESPIRATORY ANALYSIS:   There were a total of 50 respiratory events:  0 obstructive apneas, 0 central apneas and 0 mixed apneas with a total of 0 apneas and an apnea index (AI) of 0 /hour. There were 50 hypopneas with a hypopnea index of 11.6 /hour. The patient also had 0 respiratory event related arousals (RERAs).    The total APNEA/HYPOPNEA INDEX (AHI) was 11.6/hour and the total RESPIRATORY DISTURBANCE INDEX was  11.6 /hour.  47 events occurred in REM sleep and 6 events in NREM. The REM AHI was  35.7 /hour, versus a non-REM AHI of 1. The patient spent 259 minutes of total sleep time in the supine position and 0 minutes in non-supine.. The supine AHI was 11.6 versus a non-supine AHI of 0.0. OXYGEN SATURATION & C02:  The Wake baseline 02 saturation was 98%, with the lowest being 77%. Time spent below 89% saturation equaled 37 minutes. PERIODIC LIMB MOVEMENTS:  The patient had a total of 0 Periodic Limb Movements.  The Periodic Limb Movement (PLM) index was 0 and the PLM Arousal index was 0/hour. Audio and video analysis did not show any abnormal or unusual movements, behaviors, phonations or vocalizations. The patient took no bathroom breaks. Mild to moderate snoring was noted. The EKG was in keeping with normal sinus rhythm (NSR). Post-study, the patient indicated that sleep was the same as usual. IMPRESSION: 1. Obstructive Sleep Apnea (OSA) 2. Dysfunctions associated with sleep stages or arousal from sleep RECOMMENDATIONS: 1. This study demonstrates overall mild obstructive sleep apnea, severe during REM sleep, with a total AHI of 11.6/hour, REM AHI of 35.7/hour, and O2 nadir of 77%. Given the patient's medical history and sleep related complaints, treatment with positive airway pressure is recommended; this can be achieved in the form of autoPAP. Alternatively, a full-night CPAP titration study would allow optimization of therapy if needed. Other treatment options may include avoidance of supine sleep position along with weight loss,  upper airway or jaw surgery in selected patients or the use of an oral appliance in certain patients. ENT evaluation and/or consultation with a maxillofacial surgeon or dentist may be feasible in some instances.   2. Please note that untreated obstructive sleep apnea may carry additional perioperative morbidity. Patients with significant obstructive sleep apnea should receive perioperative PAP therapy and the surgeons and particularly the anesthesiologist should be informed of the diagnosis and the severity of the sleep disordered breathing. 3. This study shows delay in sleep onset and abnormal sleep stage percentages; these are nonspecific findings and per se do not signify an intrinsic sleep disorder or a cause for the patient's  sleep-related symptoms. Causes include (but are not limited to) the first night effect of the sleep study, circadian rhythm disturbances, medication effect or an underlying mood disorder or medical problem. 4. The patient should be cautioned not to drive, work at heights, or operate dangerous or heavy equipment when tired or sleepy. Review and reiteration of good sleep hygiene measures should be pursued with any patient. 5. The patient will be seen in follow-up by Dr. Rexene Alberts at [facility] for discussion of the test results and further management strategies. The referring provider will be notified of the test results. I certify that I have reviewed the entire raw data recording prior to the issuance of this report in accordance with the Standards of Accreditation of the American Academy of Sleep Medicine (AASM) [name] Age, MD, PhD Diplomat, American Board of Neurology and Sleep Medicine (Neurology and Sleep Medicine)   DG C-Arm 1-60 Min-No Report  Result Date: 06/07/2020 Fluoroscopy was utilized by the requesting physician.  No radiographic interpretation.   CT Renal Stone Study  Result Date: 06/06/2020 CLINICAL DATA:  Right-sided pain EXAM: CT ABDOMEN AND PELVIS WITHOUT CONTRAST TECHNIQUE:  Multidetector CT imaging of the abdomen and pelvis was performed following the standard protocol without IV contrast. COMPARISON:  02/08/2020 FINDINGS: Lower chest: No acute abnormality. Hepatobiliary: No solid liver abnormality is seen. No gallstones, gallbladder wall thickening, or biliary dilatation. Pancreas: Unremarkable. No pancreatic ductal dilatation or surrounding inflammatory changes. Spleen: Normal in size without significant abnormality. Adrenals/Urinary Tract: Adrenal glands are unremarkable. There are at least 3 small calculi present in the middle third of the right ureter with moderate associated hydronephrosis and hydroureter. The largest and most distal calculus measures 7 mm, the additional calculi are tiny measuring no greater than 2-3 mm (series 6, image 77). There are multiple additional tiny renal calculi bilaterally. Fat stranding about the right kidney. Bladder is unremarkable. Stomach/Bowel: Stomach is within normal limits. Appendix appears normal. No evidence of bowel wall thickening, distention, or inflammatory changes. Vascular/Lymphatic: Scattered aortic atherosclerosis. No enlarged abdominal or pelvic lymph nodes. Reproductive: Mild prostatomegaly. Other: No abdominal wall hernia or abnormality. No abdominopelvic ascites. Musculoskeletal: No acute or significant osseous findings. IMPRESSION: 1. There are at least 3 small calculi present in the middle third of the right ureter with moderate associated hydronephrosis and hydroureter. The largest and most distal calculus measures 7 mm, the additional calculi are tiny measuring no greater than 2-3 mm. 2. There are multiple additional tiny renal calculi bilaterally. 3. Mild prostatomegaly. 4. Aortic Atherosclerosis (ICD10-I70.0). Electronically Signed   By: Eddie Candle M.D.   On: 06/06/2020 13:41      Subjective: No acute issues or events overnight, mild hematuria this morning but markedly improving from previous.  Denies nausea,  vomiting, diarrhea, constipation, headache, fevers, chills.   Discharge Exam: Vitals:   06/08/20 0300 06/08/20 0721  BP: (!) 142/73 127/76  Pulse: 98 90  Resp: 18 17  Temp: 98.6 F (37 C) 97.6 F (36.4 C)  SpO2: 95% 96%   Vitals:   06/07/20 1840 06/07/20 1914 06/08/20 0300 06/08/20 0721  BP: (!) 145/81 (!) 148/76 (!) 142/73 127/76  Pulse: (!) 103 (!) 104 98 90  Resp: (!) 22 17 18 17   Temp: (!) 97.4 F (36.3 C) 98.9 F (37.2 C) 98.6 F (37 C) 97.6 F (36.4 C)  TempSrc:  Oral Oral Oral  SpO2: 97% 93% 95% 96%  Weight:      Height:        General: Pt is  alert, awake, not in acute distress Cardiovascular: RRR, S1/S2 +, no rubs, no gallops Respiratory: CTA bilaterally, no wheezing, no rhonchi Abdominal: Soft, NT, ND, bowel sounds + Extremities: no edema, no cyanosis    The results of significant diagnostics from this hospitalization (including imaging, microbiology, ancillary and laboratory) are listed below for reference.     Microbiology: Recent Results (from the past 240 hour(s))  Urine culture     Status: Abnormal   Collection Time: 06/06/20  2:04 AM   Specimen: Urine, Random  Result Value Ref Range Status   Specimen Description URINE, RANDOM  Final   Special Requests NONE  Final   Culture (A)  Final    <10,000 COLONIES/mL INSIGNIFICANT GROWTH Performed at [facility], [address], [place], [place] [address]    Report Status 06/07/2020 FINAL  Final  SARS Coronavirus 2 by RT PCR (hospital order, performed in [facility] hospital lab) Nasopharyngeal Nasopharyngeal Swab     Status: None   Collection Time: 06/06/20  5:37 PM   Specimen: Nasopharyngeal Swab  Result Value Ref Range Status   SARS Coronavirus 2 NEGATIVE NEGATIVE Final    Comment: (NOTE) SARS-CoV-2 target nucleic acids are NOT DETECTED.  The SARS-CoV-2 RNA is generally detectable in upper and lower respiratory specimens during the acute phase of infection. The lowest concentration of  SARS-CoV-2 viral copies this assay can detect is 250 copies / mL. A negative result does not preclude SARS-CoV-2 infection and should not be used as the sole basis for treatment or other patient management decisions.  A negative result may occur with improper specimen collection / handling, submission of specimen other than nasopharyngeal swab, presence of viral mutation(s) within the areas targeted by this assay, and inadequate number of viral copies (<250 copies / mL). A negative result must be combined with clinical observations, patient history, and epidemiological information.  Fact Sheet for Patients:   StrictlyIdeas.no  Fact Sheet for Healthcare Providers: BankingDealers.co.za  This test is not yet approved or  cleared by the [place] FDA and has been authorized for detection and/or diagnosis of SARS-CoV-2 by FDA under an Emergency Use Authorization (EUA).  This EUA will remain in effect (meaning this test can be used) for the duration of the COVID-19 declaration under Section 564(b)(1) of the Act, 21 U.S.C. section 360bbb-3(b)(1), unless the authorization is terminated or revoked sooner.  Performed at [facility], [place] [address], [place], [place] [address]   Surgical pcr screen     Status: Abnormal   Collection Time: 06/07/20  2:57 AM   Specimen: Nasal Mucosa; Nasal Swab  Result Value Ref Range Status   MRSA, PCR NEGATIVE NEGATIVE Final   Staphylococcus aureus POSITIVE (A) NEGATIVE Final    Comment: (NOTE) The Xpert SA Assay (FDA approved for NASAL specimens in patients 60 years of Glass and older), is one component of a comprehensive surveillance program. It is not intended to diagnose infection nor to guide or monitor treatment. Performed at [facility], [place] [address], [place], [place] [address]      Labs: BNP (last 3 results) Recent Labs    02/08/20 1849  BNP 45.8   Basic Metabolic  Panel: Recent Labs  Lab 06/06/20 0202 06/07/20 0319 06/08/20 0941  NA 135 136 135  K 5.0 5.2* 4.9  CL 106 108 106  CO2 16* 19* 16*  GLUCOSE 215* 200* 207*  BUN 42* 42* 48*  CREATININE 3.38* 3.34* 3.07*  CALCIUM 8.2* 8.2*  8.5*   Liver Function Tests: Recent Labs  Lab 06/06/20 0202  AST 12*  ALT 15  ALKPHOS 63  BILITOT 0.9  PROT 7.0  ALBUMIN 3.6   Recent Labs  Lab 06/06/20 0202  LIPASE 51   No results for input(s): AMMONIA in the last 168 hours. CBC: Recent Labs  Lab 06/06/20 0202 06/07/20 0319 06/08/20 0941  WBC 11.1* 8.5 6.8  HGB 11.7* 10.4* 11.0*  HCT 37.0* 32.1* 33.2*  MCV 91.8 90.2 89.7  PLT 212 170 230   Cardiac Enzymes: No results for input(s): CKTOTAL, CKMB, CKMBINDEX, TROPONINI in the last 168 hours. BNP: Invalid input(s): POCBNP CBG: Recent Labs  Lab 06/07/20 1129 06/07/20 1506 06/07/20 1816 06/07/20 2033 06/08/20 0636  GLUCAP 115* 126* 129* 164* 191*   D-Dimer No results for input(s): DDIMER in the last 72 hours. Hgb A1c No results for input(s): HGBA1C in the last 72 hours. Lipid Profile No results for input(s): CHOL, HDL, LDLCALC, TRIG, CHOLHDL, LDLDIRECT in the last 72 hours. Thyroid function studies No results for input(s): TSH, T4TOTAL, T3FREE, THYROIDAB in the last 72 hours.  Invalid input(s): FREET3 Anemia work up No results for input(s): VITAMINB12, FOLATE, FERRITIN, TIBC, IRON, RETICCTPCT in the last 72 hours. Urinalysis    Component Value Date/Time   COLORURINE YELLOW 06/06/2020 0204   APPEARANCEUR CLEAR 06/06/2020 0204   LABSPEC 1.017 06/06/2020 0204   PHURINE 5.0 06/06/2020 0204   GLUCOSEU 150 (A) 06/06/2020 0204   GLUCOSEU 500 (A) 01/17/2020 1442   HGBUR LARGE (A) 06/06/2020 0204   BILIRUBINUR NEGATIVE 06/06/2020 0204   KETONESUR NEGATIVE 06/06/2020 0204   PROTEINUR 30 (A) 06/06/2020 0204   UROBILINOGEN 0.2 01/17/2020 1442   NITRITE NEGATIVE 06/06/2020 0204   LEUKOCYTESUR NEGATIVE 06/06/2020 0204   Sepsis  Labs Invalid input(s): PROCALCITONIN,  WBC,  LACTICIDVEN Microbiology Recent Results (from the past 240 hour(s))  Urine culture     Status: Abnormal   Collection Time: 06/06/20  2:04 AM   Specimen: Urine, Random  Result Value Ref Range Status   Specimen Description URINE, RANDOM  Final   Special Requests NONE  Final   Culture (A)  Final    <10,000 COLONIES/mL INSIGNIFICANT GROWTH Performed at [facility], [address] [place], [place] [address]    Report Status 06/07/2020 FINAL  Final  SARS Coronavirus 2 by RT PCR (hospital order, performed in [facility] hospital lab) Nasopharyngeal Nasopharyngeal Swab     Status: None   Collection Time: 06/06/20  5:37 PM   Specimen: Nasopharyngeal Swab  Result Value Ref Range Status   SARS Coronavirus 2 NEGATIVE NEGATIVE Final    Comment: (NOTE) SARS-CoV-2 target nucleic acids are NOT DETECTED.  The SARS-CoV-2 RNA is generally detectable in upper and lower respiratory specimens during the acute phase of infection. The lowest concentration of SARS-CoV-2 viral copies this assay can detect is 250 copies / mL. A negative result does not preclude SARS-CoV-2 infection and should not be used as the sole basis for treatment or other patient management decisions.  A negative result may occur with improper specimen collection / handling, submission of specimen other than nasopharyngeal swab, presence of viral mutation(s) within the areas targeted by this assay, and inadequate number of viral copies (<250 copies / mL). A negative result must be combined with clinical observations, patient history, and epidemiological information.  Fact Sheet for Patients:   StrictlyIdeas.no  Fact Sheet for Healthcare Providers: BankingDealers.co.za  This test is not yet approved or  cleared  by the [place] and has been authorized for detection and/or diagnosis of SARS-CoV-2 by FDA under an Emergency  Use Authorization (EUA).  This EUA will remain in effect (meaning this test can be used) for the duration of the COVID-19 declaration under Section 564(b)(1) of the Act, 21 U.S.C. section 360bbb-3(b)(1), unless the authorization is terminated or revoked sooner.  Performed at [facility], [place] [address], [place], [place] [address]   Surgical pcr screen     Status: Abnormal   Collection Time: 06/07/20  2:57 AM   Specimen: Nasal Mucosa; Nasal Swab  Result Value Ref Range Status   MRSA, PCR NEGATIVE NEGATIVE Final   Staphylococcus aureus POSITIVE (A) NEGATIVE Final    Comment: (NOTE) The Xpert SA Assay (FDA approved for NASAL specimens in patients 51 years of Glass and older), is one component of a comprehensive surveillance program. It is not intended to diagnose infection nor to guide or monitor treatment. Performed at [facility], [place] [address], [place],  [address]      Time coordinating discharge: Over 30 minutes  SIGNED:   Little Ishikawa, DO [facility] 06/08/2020, 11:14 AM Pager   If 7PM-7AM, please contact night-coverage [email or web address]

## 2020-06-08 NOTE — Care Management Obs Status (Signed)
[facility] NOTIFICATION   Patient Details  Name: [name] MRN: [id number] Date of Birth: December 05, 1955   Medicare Observation Status Notification Given:  Yes    Curlene Labrum, RN 06/08/2020, 9:51 AM

## 2020-06-08 NOTE — Progress Notes (Signed)
Pt given discharge instructions and prescription. Gone over with him and wife present. All questions answered. Pt belongings gathered to be sent home with pt.

## 2020-06-08 NOTE — Plan of Care (Signed)
  Problem: Health Behavior/Discharge Planning: Goal: Ability to manage health-related needs will improve Outcome: Adequate for Discharge   Problem: Clinical Measurements: Goal: Diagnostic test results will improve Outcome: Adequate for Discharge   Problem: Pain Managment: Goal: General experience of comfort will improve Outcome: Adequate for Discharge

## 2020-06-08 NOTE — TOC Initial Note (Signed)
Transition of Care [facility]) - Initial/Assessment Note    Patient Details  Name: [name] MRN: [id number] Date of Birth: Mar 23, 1956  Transition of Care [facility]) CM/SW Contact:    Curlene Labrum, RN Phone Number: 06/08/2020, 12:43 PM  Clinical Narrative:                 Case management met with the patient prior to discharge concerning transitions of care.  The patient is under observation and will be discharged this afternoon after S/P right sided kidney stone.  The patient is having the family drive him home and does not have any needs prior to discharge to home.  Expected Discharge Plan: Home/Self Care Barriers to Discharge: No Barriers Identified   Patient Goals and CMS Choice Patient states their goals for this hospitalization and ongoing recovery are:: Patient plans to discharge home with family. CMS Medicare.gov Compare Post Acute Care list provided to:: Patient Choice offered to / list presented to : Patient  Expected Discharge Plan and Services Expected Discharge Plan: Home/Self Care   Discharge Planning Services: CM Consult   Living arrangements for the past 2 months: Single Family Home Expected Discharge Date: 06/08/20                                    Prior Living Arrangements/Services Living arrangements for the past 2 months: Single Family Home Lives with:: Significant Other Patient language and need for interpreter reviewed:: Yes Do you feel safe going back to the place where you live?: Yes      Need for Family Participation in Patient Care: Yes (Comment) Care giver support system in place?: Yes (comment)   Criminal Activity/Legal Involvement Pertinent to Current Situation/Hospitalization: No - Comment as needed  Activities of Daily Living Home Assistive Devices/Equipment: Eyeglasses, CBG Meter, Other (Comment) (does not have CPAP machine yet) ADL Screening (condition at time of admission) Patient's cognitive ability adequate to safely complete  daily activities?: Yes Is the patient deaf or have difficulty hearing?: No Does the patient have difficulty seeing, even when wearing glasses/contacts?: No Does the patient have difficulty concentrating, remembering, or making decisions?: No Patient able to express need for assistance with ADLs?: Yes Does the patient have difficulty dressing or bathing?: No Independently performs ADLs?: Yes (appropriate for developmental age) Does the patient have difficulty walking or climbing stairs?: Yes Weakness of Legs: Left (painful knee. Bone on bone) Weakness of Arms/Hands: Both  Permission Sought/Granted Permission sought to share information with : Case Manager Permission granted to share information with : Yes, Verbal Permission Granted        Permission granted to share info w Relationship: significant other     Emotional Assessment Appearance:: Appears stated age Attitude/Demeanor/Rapport: Gracious Affect (typically observed): Accepting Orientation: : Oriented to Self, Oriented to Place, Oriented to  Time, Oriented to Situation Alcohol / Substance Use: Not Applicable Psych Involvement: No (comment)  Admission diagnosis:  Kidney stone [N20.0] Renal stone [N20.0] AKI (acute kidney injury) ([place]) [N17.9] Nephrolithiasis [N20.0] Patient Active Problem List   Diagnosis Date Noted  . Nephrolithiasis 06/07/2020  . Renal stone 06/06/2020  . Type 2 diabetes mellitus with stage 3b chronic kidney disease, with long-term current use of insulin ([place]) 05/26/2020  . Visual disturbance 03/30/2020  . Chronic pain of left knee 03/30/2020  . Snores 03/30/2020  . Chronic kidney disease, stage 3b 03/30/2020  . Midline low back pain without sciatica 03/30/2020  .  Hospital discharge follow-up 02/25/2020  . Type 2 diabetes mellitus with diabetic polyneuropathy, with long-term current use of insulin ([place]) 02/23/2020  . Syncope 02/08/2020  . Renal stones 01/30/2020  . Diminished pulses in lower  extremity 01/26/2020  . AKI (acute kidney injury) ([place]) 01/26/2020  . CKD (chronic kidney disease) stage 4, GFR 15-29 ml/min (HCC) 01/05/2020  . Hyperkalemia 01/05/2020  . Long term (current) use of anticoagulants 12/22/2019  . Elevated cholesterol 12/17/2019  . Primary osteoarthritis of left knee 12/17/2019  . Arthritis 12/17/2019  . Type 2 diabetes mellitus with hyperglycemia, with long-term current use of insulin ([place]) 12/17/2019  . History of pulmonary embolism 12/17/2019  . Essential hypertension 12/17/2019   PCP:  [name], MD Pharmacy:   [id number] [place] [place] [address]Phone: [phone]: [phone]    Social Determinants of Health (SDOH) Interventions    Readmission Risk Interventions No flowsheet data found.

## 2020-06-12 ENCOUNTER — Telehealth: Payer: Self-pay

## 2020-06-12 NOTE — Telephone Encounter (Signed)
Transition Care Management Follow-up Telephone Call  Date of discharge and from where: 06/08/2020-[facility]  How have you been since you were released from the hospital? Good  Any questions or concerns? No  Items Reviewed:  Did the pt receive and understand the discharge instructions provided? Yes   Medications obtained and verified? Yes   Any new allergies since your discharge? No   Dietary orders reviewed? Yes  Do you have support at home? Yes   Functional Questionnaire: (I = Independent and D = Dependent) ADLs: I  Bathing/Dressing- I  Meal Prep- I  Eating- I  Maintaining continence- I  Transferring/Ambulation- I  Managing Meds- I  Follow up appointments reviewed:   PCP Hospital f/u appt confirmed? Yes  Scheduled to see Dr. Ethelene Hal on 06/17/20 @ 11:30am.  [facility] f/u appt confirmed? Yes  Scheduled to see Dr. Gloriann Loan  today.  Are transportation arrangements needed? No   If their condition worsens, is the pt aware to call PCP or go to the Emergency Dept.? Yes  Was the patient provided with contact information for the PCP's office or ED? Yes  Was to pt encouraged to call back with questions or concerns? Yes

## 2020-06-14 ENCOUNTER — Other Ambulatory Visit: Payer: Self-pay

## 2020-06-15 ENCOUNTER — Ambulatory Visit (INDEPENDENT_AMBULATORY_CARE_PROVIDER_SITE_OTHER): Payer: Medicare Other

## 2020-06-15 DIAGNOSIS — Z7901 Long term (current) use of anticoagulants: Secondary | ICD-10-CM | POA: Diagnosis not present

## 2020-06-15 DIAGNOSIS — Z86711 Personal history of pulmonary embolism: Secondary | ICD-10-CM

## 2020-06-15 LAB — POCT INR: INR: 1.3 — AB (ref 2.0–3.0)

## 2020-06-15 NOTE — Progress Notes (Addendum)
Pt here for INR check per  Goal INR = 2.0-3.0  Last INR = 4.3  Pt currently takes Coumadin 5 mg daily  Date of last Coumadin dose = 06/14/20  Pt denies recent antibiotics other Cephelaxin given during stent placement, no dietary changes and no unusual bruising / bleeding.  INR today = 1.3  Pt advised per Dr. Alfonso Ramus to take Coumadin 7.5 mg on Monday, Wednesday, and Friday. Take Coumadin 5 mg on Tuesday, Thursday, Saturday, and Sunday. Return in 2 weeks for repeat INR.   agreed

## 2020-06-16 ENCOUNTER — Encounter: Payer: Self-pay | Admitting: Family Medicine

## 2020-06-16 ENCOUNTER — Ambulatory Visit (INDEPENDENT_AMBULATORY_CARE_PROVIDER_SITE_OTHER): Payer: Medicare Other | Admitting: Family Medicine

## 2020-06-16 VITALS — BP 130/72 | HR 97 | Temp 98.2°F | Ht 74.0 in | Wt 242.8 lb

## 2020-06-16 DIAGNOSIS — N184 Chronic kidney disease, stage 4 (severe): Secondary | ICD-10-CM

## 2020-06-16 DIAGNOSIS — N200 Calculus of kidney: Secondary | ICD-10-CM

## 2020-06-16 DIAGNOSIS — M545 Low back pain, unspecified: Secondary | ICD-10-CM

## 2020-06-16 DIAGNOSIS — I10 Essential (primary) hypertension: Secondary | ICD-10-CM

## 2020-06-16 DIAGNOSIS — R310 Gross hematuria: Secondary | ICD-10-CM | POA: Diagnosis not present

## 2020-06-16 LAB — URINALYSIS, ROUTINE W REFLEX MICROSCOPIC
Bilirubin Urine: NEGATIVE
Ketones, ur: NEGATIVE
Leukocytes,Ua: NEGATIVE
Nitrite: NEGATIVE
Specific Gravity, Urine: 1.025 (ref 1.000–1.030)
Total Protein, Urine: NEGATIVE
Urine Glucose: 100 — AB
Urobilinogen, UA: 0.2 (ref 0.0–1.0)
pH: 5.5 (ref 5.0–8.0)

## 2020-06-16 LAB — BASIC METABOLIC PANEL
BUN: 26 mg/dL — ABNORMAL HIGH (ref 6–23)
CO2: 26 mEq/L (ref 19–32)
Calcium: 9.4 mg/dL (ref 8.4–10.5)
Chloride: 104 mEq/L (ref 96–112)
Creatinine, Ser: 1.91 mg/dL — ABNORMAL HIGH (ref 0.40–1.50)
GFR: 35.42 mL/min — ABNORMAL LOW (ref >60.00)
Glucose, Bld: 140 mg/dL — ABNORMAL HIGH (ref 70–99)
Potassium: 4.8 mEq/L (ref 3.5–5.1)
Sodium: 138 mEq/L (ref 135–145)

## 2020-06-16 LAB — CBC
HCT: 36.1 % — ABNORMAL LOW (ref 39.0–52.0)
Hemoglobin: 11.9 g/dL — ABNORMAL LOW (ref 13.0–17.0)
MCHC: 33.1 g/dL (ref 30.0–36.0)
MCV: 89.3 fl (ref 78.0–100.0)
Platelets: 293 10*3/uL (ref 150.0–400.0)
RBC: 4.04 Mil/uL — ABNORMAL LOW (ref 4.22–5.81)
RDW: 13.8 % (ref 11.5–15.5)
WBC: 7.5 10*3/uL (ref 4.0–10.5)

## 2020-06-16 NOTE — Progress Notes (Signed)
Established Patient Office Visit  Subjective:  Patient ID: [name], male    DOB: 03-14-1956  Age: 64 y.o. MRN: [id number]  CC:  Chief Complaint  Patient presents with  . Hospitalization Follow-up    hospital follow up seen for kidney stones.     HPI [name] presents for hospital discharge follow-up status post hematuria with renal lithiasis.  Renal colic has subsided and he is no longer having hematuria.  Follow-up as scheduled with urology in a few weeks.  Blood pressures well controlled with amlodipine.  Still reports mid lower back pain that radiates upward into the thoracic area.  He believes that prescribed methocarbamol led to visual field defects.  He does not feel as though the paraspinous muscles are in spasm.  Past Medical History:  Diagnosis Date  . Arthritis   . Diabetes mellitus without complication ([place])   . History of kidney stones   . History of pulmonary embolus (PE)   . Hypertension   . Lipidemia   . Prostate enlargement     Past Surgical History:  Procedure Laterality Date  . CYSTOSCOPY/URETEROSCOPY/HOLMIUM LASER/STENT PLACEMENT Right 06/07/2020   Procedure: CYSTOSCOPY/URETEROSCOPY/HOLMIUM LASER/STENT PLACEMENT;  Surgeon: Lucas Mallow, MD;  Location: [facility];  Service: Urology;  Laterality: Right;    Family History  Problem Relation Age of Onset  . Cancer Mother   . Heart disease Father     Social History   Socioeconomic History  . Marital status: Significant Other    Spouse name: Not on file  . Number of children: Not on file  . Years of education: Not on file  . Highest education level: Not on file  Occupational History  . Occupation: retired  Tobacco Use  . Smoking status: Never Smoker  . Smokeless tobacco: Never Used  Vaping Use  . Vaping Use: Never used  Substance and Sexual Activity  . Alcohol use: Yes    Alcohol/week: 1.0 standard drink    Types: 1 Cans of beer per week    Comment: social  . Drug use: Never  . Sexual  activity: Yes  Other Topics Concern  . Not on file  Social History Narrative  . Not on file   Social Determinants of Health   Financial Resource Strain:   . Difficulty of Paying Living Expenses:   Food Insecurity:   . Worried About Charity fundraiser in the Last Year:   . Arboriculturist in the Last Year:   Transportation Needs:   . Film/video editor (Medical):   [name] Lack of Transportation (Non-Medical):   Physical Activity:   . Days of Exercise per Week:   . Minutes of Exercise per Session:   Stress:   . Feeling of Stress :   Social Connections:   . Frequency of Communication with Friends and Family:   . Frequency of Social Gatherings with Friends and Family:   . Attends Religious Services:   . Active Member of Clubs or Organizations:   . Attends Archivist Meetings:   [name] Marital Status:   Intimate Partner Violence:   . Fear of Current or Ex-Partner:   . Emotionally Abused:   [name] Physically Abused:   . Sexually Abused:     Outpatient Medications Prior to Visit  Medication Sig Dispense Refill  . Accu-Chek Softclix Lancets lancets Use as instructed to check blood sugar 3 times daily.E11.65 100 each 2  . acetaminophen (TYLENOL 8 HOUR ARTHRITIS PAIN) 650 MG CR  tablet Take 650 mg by mouth every 8 (eight) hours as needed for pain.    [name] amLODipine (NORVASC) 10 MG tablet Take 1 tablet (10 mg total) by mouth daily. 90 tablet 3  . aspirin EC 81 MG tablet Take 81 mg by mouth daily.    . Blood Glucose Monitoring Suppl (ACCU-CHEK AVIVA) device by Other route. Use as instructed    . cilostazol (PLETAL) 100 MG tablet Take 100 mg by mouth 2 (two) times daily.    [name] gabapentin (NEURONTIN) 300 MG capsule Take 300 mg by mouth 3 (three) times daily as needed (pain).     [name] glucose blood (ACCU-CHEK GUIDE) test strip Use to check blood sugar 3 times daily.E11.65 100 each 12  . insulin NPH-regular Human (HUMULIN 70/30) (70-30) 100 UNIT/ML injection Inject 46 Units into the skin daily  with breakfast AND 56 Units daily with supper. (Patient taking differently: Inject 30-50 Units into the skin daily with breakfast AND 50 Units daily with supper.) 40 mL 3  . Insulin Syringes, Disposable, U-100 1 ML MISC 1 Device by Does not apply route as directed. 100 each 11  . lovastatin (MEVACOR) 40 MG tablet Take 1 tablet (40 mg total) by mouth at bedtime. 90 tablet 2  . montelukast (SINGULAIR) 10 MG tablet TAKE 1 TABLET BY MOUTH EVERY DAY (Patient taking differently: Take 10 mg by mouth at bedtime. ) 90 tablet 1  . traMADol (ULTRAM) 50 MG tablet Take 1 tablet (50 mg total) by mouth every 6 (six) hours as needed for moderate pain. 30 tablet 1  . warfarin (COUMADIN) 5 MG tablet TAKE 1 TABLET (5 MG TOTAL) BY MOUTH DAILY. TAKE AS DIRECTED. (Patient taking differently: Take 5 mg by mouth daily. ) 90 tablet 2  . methocarbamol (ROBAXIN) 500 MG tablet Take 1 tablet (500 mg total) by mouth every 8 (eight) hours as needed for muscle spasms. (Patient not taking: Reported on 06/16/2020) 30 tablet 0  . oxyCODONE (OXY IR/ROXICODONE) 5 MG immediate release tablet Take 1 tablet (5 mg total) by mouth every 4 (four) hours as needed for moderate pain. (Patient not taking: Reported on 06/16/2020) 30 tablet 0   No facility-administered medications prior to visit.    Allergies  Allergen Reactions  . Lisinopril Other (See Comments)    Patient states that this medication makes him feel "off". Tolerates losartan.    ROS Review of Systems  Constitutional: Negative.   HENT: Negative.   Eyes: Negative for photophobia and visual disturbance.  Respiratory: Negative.   Cardiovascular: Negative.   Gastrointestinal: Negative.   Genitourinary: Negative for difficulty urinating, dysuria, flank pain, frequency, hematuria and urgency.  Musculoskeletal: Positive for back pain. Negative for myalgias.  Skin: Negative for pallor and rash.  Allergic/Immunologic: Negative for immunocompromised state.  Neurological: Negative  for speech difficulty and light-headedness.  Hematological: Does not bruise/bleed easily.  Psychiatric/Behavioral: Negative.       Objective:    Physical Exam Vitals and nursing note reviewed.  Constitutional:      General: He is not in acute distress.    Appearance: Normal appearance. He is not ill-appearing, toxic-appearing or diaphoretic.  HENT:     Head: Normocephalic and atraumatic.     Right Ear: External ear normal.     Left Ear: External ear normal.  Eyes:     General: No scleral icterus.       Right eye: No discharge.        Left eye: No discharge.  Conjunctiva/sclera: Conjunctivae normal.  Cardiovascular:     Rate and Rhythm: Normal rate and regular rhythm.  Pulmonary:     Effort: Pulmonary effort is normal.     Breath sounds: Normal breath sounds.  Abdominal:     General: Bowel sounds are normal.     Tenderness: There is no right CVA tenderness or left CVA tenderness.  Musculoskeletal:     Cervical back: No rigidity or tenderness.  Lymphadenopathy:     Cervical: No cervical adenopathy.  Skin:    General: Skin is warm and dry.  Neurological:     Mental Status: He is alert and oriented to person, place, and time.  Psychiatric:        Mood and Affect: Mood normal.        Behavior: Behavior normal.     BP 130/72   Pulse 97   Temp 98.2 F (36.8 C) (Tympanic)   Ht 6\' 2"  (1.88 m)   Wt 242 lb 12.8 oz (110.1 kg)   SpO2 95%   BMI 31.17 kg/m  Wt Readings from Last 3 Encounters:  06/16/20 242 lb 12.8 oz (110.1 kg)  06/07/20 275 lb 9.2 oz (125 kg)  05/30/20 (!) 246 lb (111.6 kg)     Health Maintenance Due  Topic Date Due  . Hepatitis C Screening  Never done  . OPHTHALMOLOGY EXAM  Never done  . TETANUS/TDAP  Never done  . COLONOSCOPY  Never done  . INFLUENZA VACCINE  06/04/2020    There are no preventive care reminders to display for this patient.  Lab Results  Component Value Date   TSH 1.344 02/09/2020   Lab Results  Component Value Date     WBC 6.8 06/08/2020   HGB 11.0 (L) 06/08/2020   HCT 33.2 (L) 06/08/2020   MCV 89.7 06/08/2020   PLT 230 06/08/2020   Lab Results  Component Value Date   NA 135 06/08/2020   K 4.9 06/08/2020   CO2 16 (L) 06/08/2020   GLUCOSE 207 (H) 06/08/2020   BUN 48 (H) 06/08/2020   CREATININE 3.07 (H) 06/08/2020   BILITOT 0.9 06/06/2020   ALKPHOS 63 06/06/2020   AST 12 (L) 06/06/2020   ALT 15 06/06/2020   PROT 7.0 06/06/2020   ALBUMIN 3.6 06/06/2020   CALCIUM 8.5 (L) 06/08/2020   ANIONGAP 13 06/08/2020   GFR 34.61 (L) 03/30/2020   Lab Results  Component Value Date   CHOL 156 12/22/2019   Lab Results  Component Value Date   HDL 41.40 12/22/2019   No results found for: [facility] Lab Results  Component Value Date   TRIG 251.0 (H) 12/22/2019   Lab Results  Component Value Date   CHOLHDL 4 12/22/2019   Lab Results  Component Value Date   HGBA1C 9.2 (A) 05/26/2020      Assessment & Plan:   Problem List Items Addressed This Visit      Cardiovascular and Mediastinum   Essential hypertension   Relevant Orders   CBC   Urinalysis, Routine w reflex microscopic   Basic metabolic panel     Genitourinary   CKD (chronic kidney disease) stage 4, GFR 15-29 ml/min (HCC) - Primary   Relevant Orders   CBC   Urinalysis, Routine w reflex microscopic   Basic metabolic panel   Renal lithiasis   Relevant Orders   Urinalysis, Routine w reflex microscopic   Gross hematuria   Relevant Orders   CBC   Urinalysis, Routine w reflex microscopic  Other   Midline low back pain without sciatica      No orders of the defined types were placed in this encounter.   Follow-up: Return in about 3 months (around 09/16/2020).  Continue current medications for hypertension.  Follow-up with urology in few weeks.  Follow-up for Coumadin management as directed.  Will discontinue Robaxin and continue the Neurontin.  He says that this is helping his back as well.  [name], MD

## 2020-06-19 ENCOUNTER — Encounter: Payer: Self-pay | Admitting: Family Medicine

## 2020-06-19 DIAGNOSIS — G8929 Other chronic pain: Secondary | ICD-10-CM

## 2020-06-19 DIAGNOSIS — M1712 Unilateral primary osteoarthritis, left knee: Secondary | ICD-10-CM

## 2020-06-28 ENCOUNTER — Other Ambulatory Visit: Payer: Self-pay

## 2020-06-28 NOTE — Progress Notes (Signed)
Addendum created to obtain physician signature.

## 2020-06-29 ENCOUNTER — Ambulatory Visit: Payer: Medicare Other

## 2020-06-29 DIAGNOSIS — Z7901 Long term (current) use of anticoagulants: Secondary | ICD-10-CM

## 2020-06-29 DIAGNOSIS — Z86711 Personal history of pulmonary embolism: Secondary | ICD-10-CM

## 2020-06-29 LAB — POCT INR: INR: 2.9 (ref 2.0–3.0)

## 2020-06-29 NOTE — Progress Notes (Unsigned)
Pt here for INR check per order from Dr. Ethelene Hal  Goal INR = 2.0-3.0  Last INR = 1.3  Pt currently takes Coumadin 7.5 mg on Monday, Wednesday, and Friday and Coumadin 5 mg on Tuesday, Thursday, Saturday, and Sunday.  Date of last Coumadin dose = 06/28/2020  Pt denies recent antibiotics, no dietary changes and no unusual bruising / bleeding.  INR today = 2.9  Pt advised per Dr. Ethelene Hal to take Coumadin 7.5 mg on Monday, Wednesday, and Friday and to take Coumadin 5 mg on Tuesday, Thursday, Saturday, and Sunday. Patient verbalized understanding. Return in 1 month for repeat INR.

## 2020-06-30 ENCOUNTER — Ambulatory Visit (INDEPENDENT_AMBULATORY_CARE_PROVIDER_SITE_OTHER): Payer: Medicare Other | Admitting: Orthopaedic Surgery

## 2020-06-30 ENCOUNTER — Ambulatory Visit (INDEPENDENT_AMBULATORY_CARE_PROVIDER_SITE_OTHER): Payer: Medicare Other

## 2020-06-30 ENCOUNTER — Encounter: Payer: Self-pay | Admitting: Orthopaedic Surgery

## 2020-06-30 VITALS — Ht 73.0 in | Wt 241.0 lb

## 2020-06-30 DIAGNOSIS — Z794 Long term (current) use of insulin: Secondary | ICD-10-CM | POA: Diagnosis not present

## 2020-06-30 DIAGNOSIS — M1712 Unilateral primary osteoarthritis, left knee: Secondary | ICD-10-CM | POA: Diagnosis not present

## 2020-06-30 DIAGNOSIS — E1165 Type 2 diabetes mellitus with hyperglycemia: Secondary | ICD-10-CM | POA: Diagnosis not present

## 2020-06-30 NOTE — Progress Notes (Signed)
Office Visit Note   Patient: [name]           Date of Birth: [birth year]           MRN: [id number] Visit Date: 06/30/2020              Requested by: [name] Hams, MD [place],  [place] [address] PCP: [name] Maw, MD   Assessment & Plan: Visit Diagnoses:  1. Primary osteoarthritis of left knee   2. Type 2 diabetes mellitus with hyperglycemia, with long-term current use of insulin (HCC)     Plan: Impression is end-stage left knee DJD with lack of relief from conservative treatments.  Based on our discussion I have recommended a total knee replacement as a more permanent solution to his chronic knee pain.  Unfortunately we cannot proceed with surgery until his A1c is below 7.8.  He will work with his endocrinologist or PCP on getting the A1c down to an acceptable level for surgery.  He will also need to stop his warfarin prior to surgery.  INR needs to be less than 1.5 in order to safely have surgery.  Patient will contact us once his A1c is less than 7.8.  Questions encouraged and answered.  Follow-Up Instructions: Return if symptoms worsen or fail to improve.   Orders:  Orders Placed This Encounter  Procedures  . XR KNEE 3 VIEW LEFT   No orders of the defined types were placed in this encounter.     Procedures: No procedures performed   Clinical Data: No additional findings.   Subjective: Chief Complaint  Patient presents with  . Left Knee - Pain    [name] is a very pleasant 64 year old gentleman here for evaluation of chronic left knee pain for many years.  He describes pain throughout his knee that is worse with any sort of activity or using stairs.  Rest does improve the pain.  He has had 2 Synvisc injections with only partial relief and short-term relief.  He denies any giving way.  He feels a lot of grinding in his knee.  He is diabetic on insulin and on warfarin for history of PE.  He has had significant difficulty with ADLs and  quality of life due to the left knee pain.   Review of Systems  Constitutional: Negative.   All other systems reviewed and are negative.    Objective: Vital Signs: Ht 6\' 1"  (1.854 m)   Wt 241 lb (109.3 kg)   BMI 31.80 kg/m   Physical Exam Vitals and nursing note reviewed.  Constitutional:      Appearance: He is well-developed.  HENT:     Head: Normocephalic and atraumatic.  Eyes:     Pupils: Pupils are equal, round, and reactive to light.  Pulmonary:     Effort: Pulmonary effort is normal.  Abdominal:     Palpations: Abdomen is soft.  Musculoskeletal:        General: Normal range of motion.     Cervical back: Neck supple.  Skin:    General: Skin is warm.  Neurological:     Mental Status: He is alert and oriented to person, place, and time.  Psychiatric:        Behavior: Behavior normal.        Thought Content: Thought content normal.        Judgment: Judgment normal.     Ortho Exam Left knee shows a trace joint effusion.  He has  bilateral venous stasis skin changes.  Neurovascular intact.  Moderately limited range of motion with moderate pain.  Collaterals and cruciates are stable.  Patellofemoral crepitus. Specialty Comments:  No specialty comments available.  Imaging: XR KNEE 3 VIEW LEFT  Result Date: 06/30/2020 Advanced tricompartment degenerative joint disease.  There is wear of the medial tibial plateau.    PMFS History: Patient Active Problem List   Diagnosis Date Noted  . Gross hematuria 06/16/2020  . Renal lithiasis 06/07/2020  . Renal stone 06/06/2020  . Type 2 diabetes mellitus with stage 3b chronic kidney disease, with long-term current use of insulin ([place]) 05/26/2020  . Visual disturbance 03/30/2020  . Chronic pain of left knee 03/30/2020  . Snores 03/30/2020  . Chronic kidney disease, stage 3b 03/30/2020  . Midline low back pain without sciatica 03/30/2020  . Hospital discharge follow-up 02/25/2020  . Type 2 diabetes mellitus with  diabetic polyneuropathy, with long-term current use of insulin ([place]) 02/23/2020  . Syncope 02/08/2020  . Renal stones 01/30/2020  . Diminished pulses in lower extremity 01/26/2020  . AKI (acute kidney injury) ([place]) 01/26/2020  . CKD (chronic kidney disease) stage 4, GFR 15-29 ml/min (HCC) 01/05/2020  . Hyperkalemia 01/05/2020  . Long term (current) use of anticoagulants 12/22/2019  . Elevated cholesterol 12/17/2019  . Primary osteoarthritis of left knee 12/17/2019  . Arthritis 12/17/2019  . Type 2 diabetes mellitus with hyperglycemia, with long-term current use of insulin ([place]) 12/17/2019  . History of pulmonary embolism 12/17/2019  . Essential hypertension 12/17/2019   Past Medical History:  Diagnosis Date  . Arthritis   . Diabetes mellitus without complication ([place])   . History of kidney stones   . History of pulmonary embolus (PE)   . Hypertension   . Lipidemia   . Prostate enlargement     Family History  Problem Relation Age of Onset  . Cancer Mother   . Heart disease Father     Past Surgical History:  Procedure Laterality Date  . CYSTOSCOPY/URETEROSCOPY/HOLMIUM LASER/STENT PLACEMENT Right 06/07/2020   Procedure: CYSTOSCOPY/URETEROSCOPY/HOLMIUM LASER/STENT PLACEMENT;  Surgeon: [name] Mallow, MD;  Location: [facility];  Service: Urology;  Laterality: Right;   Social History   Occupational History  . Occupation: retired  Tobacco Use  . Smoking status: Never Smoker  . Smokeless tobacco: Never Used  Vaping Use  . Vaping Use: Never used  Substance and Sexual Activity  . Alcohol use: Yes    Alcohol/week: 1.0 standard drink    Types: 1 Cans of beer per week    Comment: social  . Drug use: Never  . Sexual activity: Yes

## 2020-07-01 ENCOUNTER — Other Ambulatory Visit: Payer: Self-pay | Admitting: Family Medicine

## 2020-07-21 NOTE — Progress Notes (Addendum)
Cardiology Office Note:    Date:  07/21/2020   ID:  [name], DOB [birth year], MRN [id number]  PCP:  [name], MD  Cardiologist:  Elouise Munroe, MD  Electrophysiologist:  None   Referring MD: [name]   Chief Complaint/Reason for Referral: syncope  History of Present Illness:    [name] is a 64 y.o. male with a history of PE on Coumadin, diabetes on insulin, stage IV chronic kidney disease with baseline creatinine around 1.7, recent kidney stone and emergency room visit with AKI. Initially met him in hospital for an episode of syncope which responded to IV fluids and stopping his lisinopril and chlorthalidone.   He feels well since hospital discharge and denies chest pain, shortness of breath, recurrent syncope. Blood pressure was mildly elevated, we initiated amlodipine, he is tolerating well. No CP, SOB, lightheadedness. No significant LE edema.   Past Medical History:  Diagnosis Date  . Arthritis   . Diabetes mellitus without complication ([place])   . History of kidney stones   . History of pulmonary embolus (PE)   . Hypertension   . Lipidemia   . Prostate enlargement     Past Surgical History:  Procedure Laterality Date  . CYSTOSCOPY/URETEROSCOPY/HOLMIUM LASER/STENT PLACEMENT Right 06/07/2020   Procedure: CYSTOSCOPY/URETEROSCOPY/HOLMIUM LASER/STENT PLACEMENT;  Surgeon: Lucas Mallow, MD;  Location: [facility];  Service: Urology;  Laterality: Right;    Current Medications: Current Outpatient Medications on File Prior to Visit  Medication Sig Dispense Refill  . Accu-Chek Softclix Lancets lancets Use as instructed to check blood sugar 3 times daily.E11.65 100 each 2  . acetaminophen (TYLENOL 8 HOUR ARTHRITIS PAIN) 650 MG CR tablet Take 650 mg by mouth every 8 (eight) hours as needed for pain.    [name] amLODipine (NORVASC) 10 MG tablet Take 1 tablet (10 mg total) by mouth daily. 90 tablet 3  . aspirin EC 81 MG tablet Take 81 mg by mouth  daily.    . Blood Glucose Monitoring Suppl (ACCU-CHEK AVIVA) device by Other route. Use as instructed    . gabapentin (NEURONTIN) 300 MG capsule Take 300 mg by mouth 3 (three) times daily as needed (pain).     [name] glucose blood (ACCU-CHEK GUIDE) test strip Use to check blood sugar 3 times daily.E11.65 100 each 12  . insulin NPH-regular Human (HUMULIN 70/30) (70-30) 100 UNIT/ML injection Inject 46 Units into the skin daily with breakfast AND 56 Units daily with supper. (Patient taking differently: Inject 30-50 Units into the skin daily with breakfast AND 50 Units daily with supper.) 40 mL 3  . Insulin Syringes, Disposable, U-100 1 ML MISC 1 Device by Does not apply route as directed. 100 each 11  . lovastatin (MEVACOR) 40 MG tablet Take 1 tablet (40 mg total) by mouth at bedtime. 90 tablet 2  . methocarbamol (ROBAXIN) 500 MG tablet Take 1 tablet (500 mg total) by mouth every 8 (eight) hours as needed for muscle spasms. 30 tablet 0  . montelukast (SINGULAIR) 10 MG tablet TAKE 1 TABLET BY MOUTH EVERY DAY (Patient taking differently: Take 10 mg by mouth at bedtime. ) 90 tablet 1  . traMADol (ULTRAM) 50 MG tablet Take 1 tablet (50 mg total) by mouth every 6 (six) hours as needed for moderate pain. 30 tablet 1  . warfarin (COUMADIN) 5 MG tablet TAKE 1 TABLET (5 MG TOTAL) BY MOUTH DAILY. TAKE AS DIRECTED. (Patient taking differently: Take 5 mg by mouth daily. ) 90 tablet 2  No current facility-administered medications on file prior to visit.     Allergies:   Lisinopril   Social History   Tobacco Use  . Smoking status: Never Smoker  . Smokeless tobacco: Never Used  Vaping Use  . Vaping Use: Never used  Substance Use Topics  . Alcohol use: Yes    Alcohol/week: 1.0 standard drink    Types: 1 Cans of beer per week    Comment: social  . Drug use: Never     Family History: The patient's family history includes Cancer in his mother; Heart disease in his father.  ROS:   Please see the history of  present illness.    All other systems reviewed and are negative.  EKGs/Labs/Other Studies Reviewed:    The following studies were reviewed today:  Recent Labs: 02/08/2020: B Natriuretic Peptide 11.3 02/09/2020: Magnesium 2.1; TSH 1.344 06/06/2020: ALT 15 06/16/2020: BUN 26; Creatinine, Ser 1.91; Hemoglobin 11.9; Platelets 293.0; Potassium 4.8; Sodium 138  Recent Lipid Panel    Component Value Date/Time   CHOL 156 12/22/2019 0949   TRIG 251.0 (H) 12/22/2019 0949   HDL 41.40 12/22/2019 0949   CHOLHDL 4 12/22/2019 0949   VLDL 50.2 (H) 12/22/2019 0949   LDLDIRECT 80.0 12/22/2019 0949    Physical Exam:    VS:  BP (!) 148/64   Pulse (!) 114   Ht 6' 1"  (1.854 m)   Wt (!) 246 lb (111.6 kg)   SpO2 96%   BMI 32.46 kg/m     Wt Readings from Last 5 Encounters:  06/30/20 241 lb (109.3 kg)  06/16/20 242 lb 12.8 oz (110.1 kg)  06/07/20 275 lb 9.2 oz (125 kg)  05/30/20 (!) 246 lb (111.6 kg)  05/26/20 (!) 248 lb 9.6 oz (112.8 kg)    Constitutional: No acute distress Eyes: sclera non-icteric, normal conjunctiva and lids ENMT: normal dentition, moist mucous membranes Cardiovascular: regular rhythm, normal rate, no murmurs. S1 and S2 normal. Radial pulses normal bilaterally. No jugular venous distention.  Respiratory: clear to auscultation bilaterally GI : normal bowel sounds, soft and nontender. No distention.   MSK: extremities warm, well perfused. No edema.  NEURO: grossly nonfocal exam, moves all extremities. PSYCH: alert and oriented x 3, normal mood and affect.   ASSESSMENT:    1. Vasovagal syncope   2. Essential hypertension   3. Type 2 diabetes mellitus with diabetic polyneuropathy, with long-term current use of insulin ([place])   4. Mixed hyperlipidemia    PLAN:    Vasovagal syncope - no recurrence. Continue to hydrate, recommend compression stockings. Avoid hypoglycemia with diabetes.   Essential hypertension - continue amlodipine and losartan.  Type 2 diabetes mellitus  with diabetic polyneuropathy, with long-term current use of insulin ([facility]) - per PCP, A1C 9.2  Mixed hyperlipidemia - on lovastatin 40 mg daily. Discussed dietary contribution to hypertrig as well as blood sugar control.  Hx PE/long term AC - continue warfarin, INR 2.8  Total time of encounter: 30 minutes total time of encounter, including 18 minutes spent in face-to-face patient care on the date of this encounter. This time includes coordination of care and counseling regarding above mentioned problem list. Remainder of non-face-to-face time involved reviewing chart documents/testing relevant to the patient encounter and documentation in the medical record. I have independently reviewed documentation from referring provider.   Cherlynn Kaiser, MD [place]  [facility]    Medication Adjustments/Labs and Tests Ordered: Current medicines are reviewed at length with the patient today.  Concerns  regarding medicines are outlined above.  No orders of the defined types were placed in this encounter.  No orders of the defined types were placed in this encounter.   Patient Instructions  Medication Instructions:  Your Physician recommend you continue on your current medication as directed.    *If you need a refill on your cardiac medications before your next appointment, please call your pharmacy*   Lab Work: None   Testing/Procedures: None   Follow-Up: At [facility], you and your health needs are our priority.  As part of our continuing mission to provide you with exceptional heart care, we have created designated Provider Care Teams.  These Care Teams include your primary Cardiologist (physician) and Advanced Practice Providers (APPs -  Physician Assistants and Nurse Practitioners) who all work together to provide you with the care you need, when you need it.  We recommend signing up for the patient portal called "MyChart".  Sign up information is provided on this After Visit  Summary.  MyChart is used to connect with patients for Virtual Visits (Telemedicine).  Patients are able to view lab/test results, encounter notes, upcoming appointments, etc.  Non-urgent messages can be sent to your provider as well.   To learn more about what you can do with MyChart, go to [email or web address].    Your next appointment:   6 month(s)  The format for your next appointment:   In Person  Provider:   Dr. Margaretann Loveless

## 2020-07-27 ENCOUNTER — Ambulatory Visit (INDEPENDENT_AMBULATORY_CARE_PROVIDER_SITE_OTHER): Payer: Medicare Other

## 2020-07-27 ENCOUNTER — Other Ambulatory Visit: Payer: Self-pay

## 2020-07-27 DIAGNOSIS — Z7901 Long term (current) use of anticoagulants: Secondary | ICD-10-CM

## 2020-07-27 DIAGNOSIS — Z86711 Personal history of pulmonary embolism: Secondary | ICD-10-CM

## 2020-07-27 LAB — POCT INR: INR: 3.1 — AB (ref 2.0–3.0)

## 2020-07-27 NOTE — Progress Notes (Signed)
Pt here for INR check per Dr. Abelino Derrick  Goal INR = 2.0-3.0  Last INR = 2.9  Pt currently takes Coumadin 7.5 mg on Monday, Wednesday, and Friday and Coumadin 5 mg on Tuesday, Thursday, Saturday, and Sunday.   Date of last Coumadin dose = 07/26/20  Pt denies recent antibiotics, no dietary changes and no unusual bruising / bleeding.  INR today =  3.1  Pt advised per Dr. Ethelene Hal to take Coumadin 7.5 mg on Monday and Thursday and to take Coumadin 5 mg on Sunday, Tuesday, Wednesday, Friday, and Saturday. Repeat INR in 2 weeks. Appointment scheduled 08/10/20 @ 1020.

## 2020-08-04 ENCOUNTER — Other Ambulatory Visit: Payer: Self-pay | Admitting: Internal Medicine

## 2020-08-04 MED ORDER — HUMULIN 70/30 (70-30) 100 UNIT/ML ~~LOC~~ SUSP: SUBCUTANEOUS | 3 refills | Status: DC

## 2020-08-04 NOTE — Addendum Note (Signed)
Addended by: Amado Coe on: 08/04/2020 09:28 AM   Modules accepted: Orders

## 2020-08-10 ENCOUNTER — Other Ambulatory Visit: Payer: Self-pay

## 2020-08-10 ENCOUNTER — Ambulatory Visit (INDEPENDENT_AMBULATORY_CARE_PROVIDER_SITE_OTHER): Payer: Medicare Other

## 2020-08-10 DIAGNOSIS — Z7901 Long term (current) use of anticoagulants: Secondary | ICD-10-CM

## 2020-08-10 LAB — POCT INR: INR: 2.2 (ref 2.0–3.0)

## 2020-08-10 NOTE — Progress Notes (Signed)
Pt here for INR check per Dr. Abelino Derrick  Goal INR = 2.0 - 3.0  Last INR = 3.1  Pt currently takes Coumadin 7.5 mg on Monday, Thursday and Coumadin 5 mg on Sunday, Tuesday, Wednesday, Friday, Saturday.  Date of last Coumadin dose = 08/09/20  Pt denies recent antibiotics, no dietary changes and no unusual bruising / bleeding.  INR today = 2.2  Pt advised per Dr. Ethelene Hal to continue taking Coumadin 7.5 mg on Monday, Thursday and Coumadin 5 mg on Sunday, Tuesday, Wednesday, Friday, Saturday. Repeat INR in 1 month.

## 2020-08-16 ENCOUNTER — Other Ambulatory Visit: Payer: Self-pay | Admitting: Family Medicine

## 2020-08-28 ENCOUNTER — Ambulatory Visit (INDEPENDENT_AMBULATORY_CARE_PROVIDER_SITE_OTHER): Payer: Medicare Other | Admitting: Family Medicine

## 2020-08-28 ENCOUNTER — Encounter: Payer: Self-pay | Admitting: Family Medicine

## 2020-08-28 ENCOUNTER — Other Ambulatory Visit: Payer: Self-pay

## 2020-08-28 VITALS — BP 148/78 | HR 103 | Temp 98.0°F | Ht 73.0 in | Wt 248.2 lb

## 2020-08-28 DIAGNOSIS — N1832 Chronic kidney disease, stage 3b: Secondary | ICD-10-CM

## 2020-08-28 DIAGNOSIS — I10 Essential (primary) hypertension: Secondary | ICD-10-CM | POA: Diagnosis not present

## 2020-08-28 DIAGNOSIS — E7800 Pure hypercholesterolemia, unspecified: Secondary | ICD-10-CM

## 2020-08-28 MED ORDER — LOSARTAN POTASSIUM 50 MG PO TABS: 50.0000 mg | ORAL_TABLET | Freq: Every day | ORAL | 3 refills | Status: DC

## 2020-08-28 NOTE — Progress Notes (Signed)
Established Patient Office Visit  Subjective:  Patient ID: [name], male    DOB: 1956-08-26  Age: 64 y.o. MRN: [id number]  CC:  Chief Complaint  Patient presents with  . Follow-up    3 month follow up would like opinions on how to lower A1 C.     HPI [name] presents for hypertension, CKD and elevated cholesterol.  Somehow losartan was discontinued.  Blood pressure is elevated.  He does tolerate it.  Seeing endocrinology for diabetes care.  He has me want to do to lower his hemoglobin A1c's.  Toward I asked him to avoid carbohydrate which would include sweets pastas and breads.  He is nonfasting today.  Past Medical History:  Diagnosis Date  . Arthritis   . Diabetes mellitus without complication ([name])   . History of kidney stones   . History of pulmonary embolus (PE)   . Hypertension   . Lipidemia   . Prostate enlargement     Past Surgical History:  Procedure Laterality Date  . CYSTOSCOPY/URETEROSCOPY/HOLMIUM LASER/STENT PLACEMENT Right 06/07/2020   Procedure: CYSTOSCOPY/URETEROSCOPY/HOLMIUM LASER/STENT PLACEMENT;  Surgeon: Lucas Mallow, MD;  Location: [facility];  Service: Urology;  Laterality: Right;    Family History  Problem Relation Age of Onset  . Cancer Mother   . Heart disease Father     Social History   Socioeconomic History  . Marital status: Significant Other    Spouse name: Not on file  . Number of children: Not on file  . Years of education: Not on file  . Highest education level: Not on file  Occupational History  . Occupation: retired  Tobacco Use  . Smoking status: Never Smoker  . Smokeless tobacco: Never Used  Vaping Use  . Vaping Use: Never used  Substance and Sexual Activity  . Alcohol use: Yes    Alcohol/week: 1.0 standard drink    Types: 1 Cans of beer per week    Comment: social  . Drug use: Never  . Sexual activity: Yes  Other Topics Concern  . Not on file  Social History Narrative  . Not on file   Social Determinants  of Health   Financial Resource Strain:   . Difficulty of Paying Living Expenses: Not on file  Food Insecurity:   . Worried About Charity fundraiser in the Last Year: Not on file  . Ran Out of Food in the Last Year: Not on file  Transportation Needs:   . Lack of Transportation (Medical): Not on file  . Lack of Transportation (Non-Medical): Not on file  Physical Activity:   . Days of Exercise per Week: Not on file  . Minutes of Exercise per Session: Not on file  Stress:   . Feeling of Stress : Not on file  Social Connections:   . Frequency of Communication with Friends and Family: Not on file  . Frequency of Social Gatherings with Friends and Family: Not on file  . Attends Religious Services: Not on file  . Active Member of Clubs or Organizations: Not on file  . Attends Archivist Meetings: Not on file  . Marital Status: Not on file  Intimate Partner Violence:   . Fear of Current or Ex-Partner: Not on file  . Emotionally Abused: Not on file  . Physically Abused: Not on file  . Sexually Abused: Not on file    Outpatient Medications Prior to Visit  Medication Sig Dispense Refill  . Accu-Chek Softclix Lancets lancets Use  as instructed to check blood sugar 3 times daily.E11.65 100 each 2  . acetaminophen (TYLENOL 8 HOUR ARTHRITIS PAIN) 650 MG CR tablet Take 650 mg by mouth every 8 (eight) hours as needed for pain.    [name] amLODipine (NORVASC) 10 MG tablet Take 1 tablet (10 mg total) by mouth daily. 90 tablet 3  . aspirin EC 81 MG tablet Take 81 mg by mouth daily.    . Blood Glucose Monitoring Suppl (ACCU-CHEK AVIVA) device by Other route. Use as instructed    . cilostazol (PLETAL) 100 MG tablet TAKE 1 TABLET BY MOUTH TWICE A DAY 180 tablet 1  . gabapentin (NEURONTIN) 300 MG capsule Take 300 mg by mouth 3 (three) times daily as needed (pain).     [name] glucose blood (ACCU-CHEK GUIDE) test strip Use to check blood sugar 3 times daily.E11.65 100 each 12  . insulin NPH-regular Human  (HUMULIN 70/30) (70-30) 100 UNIT/ML injection INJECT 52 UNITS INTO THE SKIN 2 (TWO) TIMES DAILY WITH A MEAL. 40 mL 3  . Insulin Syringes, Disposable, U-100 1 ML MISC 1 Device by Does not apply route as directed. 100 each 11  . lovastatin (MEVACOR) 40 MG tablet Take 1 tablet (40 mg total) by mouth at bedtime. 90 tablet 2  . methocarbamol (ROBAXIN) 500 MG tablet Take 1 tablet (500 mg total) by mouth every 8 (eight) hours as needed for muscle spasms. 30 tablet 0  . montelukast (SINGULAIR) 10 MG tablet TAKE 1 TABLET BY MOUTH EVERY DAY (Patient taking differently: Take 10 mg by mouth at bedtime. ) 90 tablet 1  . warfarin (COUMADIN) 5 MG tablet TAKE 1 TABLET (5 MG TOTAL) BY MOUTH DAILY. TAKE AS DIRECTED. (Patient taking differently: Take 5 mg by mouth daily. ) 90 tablet 2  . traMADol (ULTRAM) 50 MG tablet Take 1 tablet (50 mg total) by mouth every 6 (six) hours as needed for moderate pain. (Patient not taking: Reported on 08/28/2020) 30 tablet 1   No facility-administered medications prior to visit.    Allergies  Allergen Reactions  . Lisinopril Other (See Comments)    Patient states that this medication makes him feel "off". Tolerates losartan.    ROS Review of Systems  Constitutional: Negative.   HENT: Negative.   Cardiovascular: Negative.   Gastrointestinal: Negative.   Endocrine: Negative for polyphagia and polyuria.  Genitourinary: Negative for difficulty urinating, hematuria and urgency.  Skin: Negative for pallor and rash.  Allergic/Immunologic: Negative for immunocompromised state.  Neurological: Negative for speech difficulty and light-headedness.  Hematological: Does not bruise/bleed easily.  Psychiatric/Behavioral: Negative.       Objective:    Physical Exam Vitals and nursing note reviewed.  Constitutional:      General: He is not in acute distress.    Appearance: Normal appearance. He is not ill-appearing, toxic-appearing or diaphoretic.  HENT:     Head: Normocephalic  and atraumatic.     Right Ear: External ear normal.     Left Ear: External ear normal.  Eyes:     General:        Right eye: No discharge.     Extraocular Movements: Extraocular movements intact.     Conjunctiva/sclera: Conjunctivae normal.     Pupils: Pupils are equal, round, and reactive to light.  Cardiovascular:     Rate and Rhythm: Normal rate and regular rhythm.  Pulmonary:     Effort: Pulmonary effort is normal.     Breath sounds: Normal breath sounds.  Genitourinary:  Prostate: Normal.  Musculoskeletal:     Cervical back: No rigidity.  Skin:    General: Skin is warm and dry.  Neurological:     Mental Status: He is alert.  Psychiatric:        Mood and Affect: Mood normal.        Behavior: Behavior normal.     BP (!) 148/78   Pulse (!) 103   Temp 98 F (36.7 C) (Tympanic)   Ht 6\' 1"  (1.854 m)   Wt 248 lb 3.2 oz (112.6 kg)   SpO2 94%   BMI 32.75 kg/m  Wt Readings from Last 3 Encounters:  08/28/20 248 lb 3.2 oz (112.6 kg)  06/30/20 241 lb (109.3 kg)  06/16/20 242 lb 12.8 oz (110.1 kg)     Health Maintenance Due  Topic Date Due  . Hepatitis C Screening  Never done  . OPHTHALMOLOGY EXAM  Never done  . TETANUS/TDAP  Never done  . COLONOSCOPY  Never done    There are no preventive care reminders to display for this patient.  Lab Results  Component Value Date   TSH 1.344 02/09/2020   Lab Results  Component Value Date   WBC 7.5 06/16/2020   HGB 11.9 (L) 06/16/2020   HCT 36.1 (L) 06/16/2020   MCV 89.3 06/16/2020   PLT 293.0 06/16/2020   Lab Results  Component Value Date   NA 138 06/16/2020   K 4.8 06/16/2020   CO2 26 06/16/2020   GLUCOSE 140 (H) 06/16/2020   BUN 26 (H) 06/16/2020   CREATININE 1.91 (H) 06/16/2020   BILITOT 0.9 06/06/2020   ALKPHOS 63 06/06/2020   AST 12 (L) 06/06/2020   ALT 15 06/06/2020   PROT 7.0 06/06/2020   ALBUMIN 3.6 06/06/2020   CALCIUM 9.4 06/16/2020   ANIONGAP 13 06/08/2020   GFR 35.42 (L) 06/16/2020   Lab  Results  Component Value Date   CHOL 156 12/22/2019   Lab Results  Component Value Date   HDL 41.40 12/22/2019   No results found for: [facility] Lab Results  Component Value Date   TRIG 251.0 (H) 12/22/2019   Lab Results  Component Value Date   CHOLHDL 4 12/22/2019   Lab Results  Component Value Date   HGBA1C 9.2 (A) 05/26/2020      Assessment & Plan:   Problem List Items Addressed This Visit      Cardiovascular and Mediastinum   Essential hypertension - Primary   Relevant Medications   losartan (COZAAR) 50 MG tablet   Other Relevant Orders   Basic metabolic panel   CBC   Microalbumin / creatinine urine ratio     Genitourinary   Chronic kidney disease, stage 3b ([facility]CC)   Relevant Medications   losartan (COZAAR) 50 MG tablet   Other Relevant Orders   Basic metabolic panel   Microalbumin / creatinine urine ratio     Other   Elevated cholesterol   Relevant Medications   losartan (COZAAR) 50 MG tablet   Other Relevant Orders   LDL cholesterol, direct      Meds ordered this encounter  Medications  . losartan (COZAAR) 50 MG tablet    Sig: Take 1 tablet (50 mg total) by mouth daily.    Dispense:  90 tablet    Refill:  3    Follow-up: Return in about 4 months (around 12/29/2020).   We discussed lowering carbohydrates in the diet.  He will also follow-up with endocrinology.  Restarting losartan.  Continue  [place]. [name], MD

## 2020-08-29 LAB — BASIC METABOLIC PANEL
BUN: 25 mg/dL — ABNORMAL HIGH (ref 6–23)
CO2: 23 mEq/L (ref 19–32)
Calcium: 8.8 mg/dL (ref 8.4–10.5)
Chloride: 105 mEq/L (ref 96–112)
Creatinine, Ser: 1.92 mg/dL — ABNORMAL HIGH (ref 0.40–1.50)
GFR: 35.92 mL/min — ABNORMAL LOW (ref >60.00)
Glucose, Bld: 210 mg/dL — ABNORMAL HIGH (ref 70–99)
Potassium: 4.4 mEq/L (ref 3.5–5.1)
Sodium: 137 mEq/L (ref 135–145)

## 2020-08-29 LAB — CBC
HCT: 38.2 % — ABNORMAL LOW (ref 39.0–52.0)
Hemoglobin: 12.7 g/dL — ABNORMAL LOW (ref 13.0–17.0)
MCHC: 33.3 g/dL (ref 30.0–36.0)
MCV: 88.6 fl (ref 78.0–100.0)
Platelets: 227 10*3/uL (ref 150.0–400.0)
RBC: 4.31 Mil/uL (ref 4.22–5.81)
RDW: 14.3 % (ref 11.5–15.5)
WBC: 7.7 10*3/uL (ref 4.0–10.5)

## 2020-08-29 LAB — LDL CHOLESTEROL, DIRECT: Direct LDL: 78 mg/dL

## 2020-08-29 LAB — MICROALBUMIN / CREATININE URINE RATIO
Creatinine,U: 111.3 mg/dL
Microalb Creat Ratio: 4.4 mg/g (ref 0.0–30.0)
Microalb, Ur: 4.9 mg/dL — ABNORMAL HIGH (ref 0.0–1.9)

## 2020-09-04 ENCOUNTER — Encounter: Payer: Self-pay | Admitting: Family Medicine

## 2020-09-04 ENCOUNTER — Ambulatory Visit: Payer: Medicare Other

## 2020-09-05 ENCOUNTER — Other Ambulatory Visit: Payer: Self-pay

## 2020-09-06 ENCOUNTER — Ambulatory Visit (INDEPENDENT_AMBULATORY_CARE_PROVIDER_SITE_OTHER): Payer: Medicare Other

## 2020-09-06 DIAGNOSIS — Z7901 Long term (current) use of anticoagulants: Secondary | ICD-10-CM

## 2020-09-06 LAB — POCT INR: INR: 2.1 (ref 2.0–3.0)

## 2020-09-06 NOTE — Progress Notes (Signed)
Pt here for INR check per Dr Ethelene Hal  Goal INR = 2.0 - 3.0  Last INR = 2.2  Pt currently takes Coumadin   Date of last Coumadin dose = 09/06/20  Pt denies recent antibiotics, no dietary changes and no unusual bruising / bleeding.  INR today =  2.1  Pt advised per DR Ciriglaino to continue taking: Coumadin 7.5 mg on Monday, Thursday  Coumadin 5 mg on Sunday, Tuesday, Wednesday, Friday, Saturday.   Repeat INR in 1 month.

## 2020-09-06 NOTE — Telephone Encounter (Signed)
My best guess is that you were probably dehydrated. This is a guess. Any fall, needs to be evaluated. I would advise that you make an appointment to be seen at our clinic or an urgent care to be evaluated.

## 2020-09-07 ENCOUNTER — Ambulatory Visit: Payer: Medicare Other

## 2020-09-08 ENCOUNTER — Encounter: Payer: Self-pay | Admitting: Internal Medicine

## 2020-09-12 ENCOUNTER — Encounter: Payer: Self-pay | Admitting: Family Medicine

## 2020-09-16 ENCOUNTER — Other Ambulatory Visit: Payer: Self-pay | Admitting: Internal Medicine

## 2020-09-20 NOTE — Patient Instructions (Addendum)
Please continue using your CPAP regularly. While your insurance requires that you use CPAP at least 4 hours each night on 70% of the nights, I recommend, that you not skip any nights and use it throughout the night if you can. Getting used to CPAP and staying with the treatment long term does take time and patience and discipline. Untreated obstructive sleep apnea when it is moderate to severe can have an adverse impact on cardiovascular health and raise her risk for heart disease, arrhythmias, hypertension, congestive heart failure, stroke and diabetes. Untreated obstructive sleep apnea causes sleep disruption, nonrestorative sleep, and sleep deprivation. This can have an impact on your day to day functioning and cause daytime sleepiness and impairment of cognitive function, memory loss, mood disturbance, and problems focussing. Using CPAP regularly can improve these symptoms.  Continue to monitor for leak at home. I will send orders to your DME to reach out for a mask refitting. Please let me know if you have not heard form DME in 1-2 weeks. I will repeat download in 2 months to monitor AHI. I will have you return to the office for follow up in 6 months     Sleep Apnea Sleep apnea affects breathing during sleep. It causes breathing to stop for a short time or to become shallow. It can also increase the risk of:  Heart attack.  Stroke.  Being very overweight (obese).  Diabetes.  Heart failure.  Irregular heartbeat. The goal of treatment is to help you breathe normally again. What are the causes? There are three kinds of sleep apnea:  Obstructive sleep apnea. This is caused by a blocked or collapsed airway.  Central sleep apnea. This happens when the brain does not send the right signals to the muscles that control breathing.  Mixed sleep apnea. This is a combination of obstructive and central sleep apnea. The most common cause of this condition is a collapsed or blocked airway. This  can happen if:  Your throat muscles are too relaxed.  Your tongue and tonsils are too large.  You are overweight.  Your airway is too small. What increases the risk?  Being overweight.  Smoking.  Having a small airway.  Being older.  Being male.  Drinking alcohol.  Taking medicines to calm yourself (sedatives or tranquilizers).  Having family members with the condition. What are the signs or symptoms?  Trouble staying asleep.  Being sleepy or tired during the day.  Getting angry a lot.  Loud snoring.  Headaches in the morning.  Not being able to focus your mind (concentrate).  Forgetting things.  Less interest in sex.  Mood swings.  Personality changes.  Feelings of sadness (depression).  Waking up a lot during the night to pee (urinate).  Dry mouth.  Sore throat. How is this diagnosed?  Your medical history.  A physical exam.  A test that is done when you are sleeping (sleep study). The test is most often done in a sleep lab but may also be done at home. How is this treated?   Sleeping on your side.  Using a medicine to get rid of mucus in your nose (decongestant).  Avoiding the use of alcohol, medicines to help you relax, or certain pain medicines (narcotics).  Losing weight, if needed.  Changing your diet.  Not smoking.  Using a machine to open your airway while you sleep, such as: ? An oral appliance. This is a mouthpiece that shifts your lower jaw forward. ? A CPAP device.  This device blows air through a mask when you breathe out (exhale). ? An EPAP device. This has valves that you put in each nostril. ? A BPAP device. This device blows air through a mask when you breathe in (inhale) and breathe out.  Having surgery if other treatments do not work. It is important to get treatment for sleep apnea. Without treatment, it can lead to:  High blood pressure.  Coronary artery disease.  In men, not being able to have an erection  (impotence).  Reduced thinking ability. Follow these instructions at home: Lifestyle  Make changes that your doctor recommends.  Eat a healthy diet.  Lose weight if needed.  Avoid alcohol, medicines to help you relax, and some pain medicines.  Do not use any products that contain nicotine or tobacco, such as cigarettes, e-cigarettes, and chewing tobacco. If you need help quitting, ask your doctor. General instructions  Take over-the-counter and prescription medicines only as told by your doctor.  If you were given a machine to use while you sleep, use it only as told by your doctor.  If you are having surgery, make sure to tell your doctor you have sleep apnea. You may need to bring your device with you.  Keep all follow-up visits as told by your doctor. This is important. Contact a doctor if:  The machine that you were given to use during sleep bothers you or does not seem to be working.  You do not get better.  You get worse. Get help right away if:  Your chest hurts.  You have trouble breathing in enough air.  You have an uncomfortable feeling in your back, arms, or stomach.  You have trouble talking.  One side of your body feels weak.  A part of your face is hanging down. These symptoms may be an emergency. Do not wait to see if the symptoms will go away. Get medical help right away. Call your local emergency services (911 in the [place] Do not drive yourself to the hospital. Summary  This condition affects breathing during sleep.  The most common cause is a collapsed or blocked airway.  The goal of treatment is to help you breathe normally while you sleep. This information is not intended to replace advice given to you by your health care provider. Make sure you discuss any questions you have with your health care provider. Document Revised: 08/07/2018 Document Reviewed: 06/16/2018 Elsevier Patient Education  [place].

## 2020-09-20 NOTE — Progress Notes (Addendum)
PATIENT: [name] DOB: 03/15/1956  REASON FOR VISIT: follow up HISTORY FROM: patient  Chief Complaint  Patient presents with  . Follow-up    rm 1  . Sleep Apnea    Pt has no new concerns. Pt said said he can sleep on his back again.     HISTORY OF PRESENT ILLNESS: Today 09/21/20 [name] is a 64 y.o. male here today for follow up for OSA on CPAP. Sleep study 05/16/2020 revealed "overall mild obstructive sleep apnea, severe during REM sleep, with a total AHI of 11.6/hour, REM AHI of 35.7/hour, and O2 nadir of 77%".  He reports that he has done fairly well with CPAP therapy.  He does note improvement in sleep quality and daytime energy after starting therapy.  He continues to notice significant leak with his mask.  He feels that this is due to his facial hair.  He is currently using a full facemask.  Compliance report dated 08/21/2020 through 09/19/2020 reveals that he used CPAP 30 of the past 30 days for compliance of 100%.  Use CPAP greater than 4 hours 30 of the past 30 days for compliance of 100%.  Average usage on days used was 8 hours and 5 minutes.  Residual AHI was 9.2 on 6 to 12 cm of water and EPR of 3.  There was a significant leak noted in the 95th percentile of 81.8 L/min.   HISTORY: (copied from Dr Guadelupe Sabin previous note)  Dear Dr. Ethelene Hal,   I saw your patient, [name], upon your kind request in my sleep clinic today for initial consultation of his sleep disorder, in particular, concern for underlying obstructive sleep apnea.  The patient is unaccompanied today.  As you know, Mr. Horsey is a 64 year old right-handed gentleman with an underlying medical history of arthritis, diabetes, history of PE, hypertension, kidney stones, prostate enlargement, hyperlipidemia, and obesity, who reports snoring and excessive daytime somnolence.  I reviewed your office note from 03/30/2020.  His Epworth sleepiness score is 7/24, fatigue severity score is 51 out of 63.  He has fatigue  and shortness of breath especially when physically active.  He has chronic knee pain and overall issues with arthritis, chronic back pain.  He takes Robaxin rarely, he takes tramadol rarely.  He is followed by sports medicine.  In April he was hospitalized for day for dehydration and a syncopal spell.  He has also seen urology recently for his kidney stone.  He lives with his girlfriend, has 2 dogs in the household, no kids.  They do have a TV in the bedroom and watches the news at night and has a TV on a timer.  Bedtime is generally around 11 and rise time between 7 and 8 AM.  He does not have night to night nocturia.  He does not typically wake up with a headache.  He denies any telltale symptoms of restless leg syndrome.  He has some trouble falling asleep and often difficulty staying asleep, this has become worse over the past 2 years.  In the past year he has gained about 15 pounds.  He likes to drink coffee and soda, coffee typically 16 ounce daily and soda several servings per day.  He rarely drinks alcohol and is a non-smoker, is retired as an Event organiser.   REVIEW OF SYSTEMS: Out of a complete 14 system review of symptoms, the patient complains only of the following symptoms, none and all other reviewed systems are negative.  ESS: 3  FSS: 22  ALLERGIES: Allergies  Allergen Reactions  . Lisinopril Other (See Comments)    Patient states that this medication makes him feel "off". Tolerates losartan.    HOME MEDICATIONS: Outpatient Medications Prior to Visit  Medication Sig Dispense Refill  . Accu-Chek Softclix Lancets lancets Use as instructed to check blood sugar 3 times daily Dx is E11.65 100 each 2  . acetaminophen (TYLENOL 8 HOUR ARTHRITIS PAIN) 650 MG CR tablet Take 650 mg by mouth every 8 (eight) hours as needed for pain.    [name] amLODipine (NORVASC) 10 MG tablet Take 1 tablet (10 mg total) by mouth daily. 90 tablet 3  . aspirin EC 81 MG tablet Take 81 mg by mouth daily.      . Blood Glucose Monitoring Suppl (ACCU-CHEK AVIVA) device by Other route. Use as instructed    . cilostazol (PLETAL) 100 MG tablet TAKE 1 TABLET BY MOUTH TWICE A DAY 180 tablet 1  . gabapentin (NEURONTIN) 300 MG capsule Take 300 mg by mouth 3 (three) times daily as needed (pain).     [name] glucose blood (ACCU-CHEK GUIDE) test strip Use to check blood sugar 3 times daily.E11.65 100 each 12  . insulin NPH-regular Human (HUMULIN 70/30) (70-30) 100 UNIT/ML injection INJECT 52 UNITS INTO THE SKIN 2 (TWO) TIMES DAILY WITH A MEAL. 40 mL 3  . Insulin Syringes, Disposable, U-100 1 ML MISC 1 Device by Does not apply route as directed. 100 each 11  . losartan (COZAAR) 50 MG tablet Take 1 tablet (50 mg total) by mouth daily. 90 tablet 3  . lovastatin (MEVACOR) 40 MG tablet Take 1 tablet (40 mg total) by mouth at bedtime. 90 tablet 2  . methocarbamol (ROBAXIN) 500 MG tablet Take 1 tablet (500 mg total) by mouth every 8 (eight) hours as needed for muscle spasms. 30 tablet 0  . montelukast (SINGULAIR) 10 MG tablet TAKE 1 TABLET BY MOUTH EVERY DAY (Patient taking differently: Take 10 mg by mouth at bedtime. ) 90 tablet 1  . warfarin (COUMADIN) 5 MG tablet TAKE 1 TABLET (5 MG TOTAL) BY MOUTH DAILY. TAKE AS DIRECTED. (Patient taking differently: Take 5 mg by mouth daily. ) 90 tablet 2   No facility-administered medications prior to visit.    PAST MEDICAL HISTORY: Past Medical History:  Diagnosis Date  . Arthritis   . Diabetes mellitus without complication ([place])   . History of kidney stones   . History of pulmonary embolus (PE)   . Hypertension   . Lipidemia   . Prostate enlargement     PAST SURGICAL HISTORY: Past Surgical History:  Procedure Laterality Date  . CYSTOSCOPY/URETEROSCOPY/HOLMIUM LASER/STENT PLACEMENT Right 06/07/2020   Procedure: CYSTOSCOPY/URETEROSCOPY/HOLMIUM LASER/STENT PLACEMENT;  Surgeon: Lucas Mallow, MD;  Location: [facility];  Service: Urology;  Laterality: Right;    FAMILY  HISTORY: Family History  Problem Relation Age of Onset  . Cancer Mother   . Heart disease Father     SOCIAL HISTORY: Social History   Socioeconomic History  . Marital status: Significant Other    Spouse name: Not on file  . Number of children: Not on file  . Years of education: Not on file  . Highest education level: Not on file  Occupational History  . Occupation: retired  Tobacco Use  . Smoking status: Never Smoker  . Smokeless tobacco: Never Used  Vaping Use  . Vaping Use: Never used  Substance and Sexual Activity  . Alcohol use: Yes    Alcohol/week:  1.0 standard drink    Types: 1 Cans of beer per week    Comment: social  . Drug use: Never  . Sexual activity: Yes  Other Topics Concern  . Not on file  Social History Narrative  . Not on file   Social Determinants of Health   Financial Resource Strain:   . Difficulty of Paying Living Expenses: Not on file  Food Insecurity:   . Worried About Charity fundraiser in the Last Year: Not on file  . Ran Out of Food in the Last Year: Not on file  Transportation Needs:   . Lack of Transportation (Medical): Not on file  . Lack of Transportation (Non-Medical): Not on file  Physical Activity:   . Days of Exercise per Week: Not on file  . Minutes of Exercise per Session: Not on file  Stress:   . Feeling of Stress : Not on file  Social Connections:   . Frequency of Communication with Friends and Family: Not on file  . Frequency of Social Gatherings with Friends and Family: Not on file  . Attends Religious Services: Not on file  . Active Member of Clubs or Organizations: Not on file  . Attends Archivist Meetings: Not on file  . Marital Status: Not on file  Intimate Partner Violence:   . Fear of Current or Ex-Partner: Not on file  . Emotionally Abused: Not on file  . Physically Abused: Not on file  . Sexually Abused: Not on file     PHYSICAL EXAM  Vitals:   09/21/20 0942  BP: (!) 144/85  Pulse: 89   Weight: 249 lb (112.9 kg)  Height: 6\' 1"  (1.854 m)   Body mass index is 32.85 kg/m.  Generalized: Well developed, in no acute distress  Cardiology: normal rate and rhythm, no murmur noted Respiratory: clear to auscultation bilaterally  Neurological examination  Mentation: Alert oriented to time, place, history taking. Follows all commands speech and language fluent Cranial nerve II-XII: Pupils were equal round reactive to light. Extraocular movements were full, visual field were full  Motor: The motor testing reveals 5 over 5 strength of all 4 extremities. Good symmetric motor tone is noted throughout.  Gait and station: arthritic gait, stable without assistive device.    DIAGNOSTIC DATA (LABS, IMAGING, TESTING) - I reviewed patient records, labs, notes, testing and imaging myself where available.  No flowsheet data found.   Lab Results  Component Value Date   WBC 7.7 08/28/2020   HGB 12.7 (L) 08/28/2020   HCT 38.2 (L) 08/28/2020   MCV 88.6 08/28/2020   PLT 227.0 08/28/2020      Component Value Date/Time   NA 137 08/28/2020 1413   K 4.4 08/28/2020 1413   CL 105 08/28/2020 1413   CO2 23 08/28/2020 1413   GLUCOSE 210 (H) 08/28/2020 1413   BUN 25 (H) 08/28/2020 1413   CREATININE 1.92 (H) 08/28/2020 1413   CALCIUM 8.8 08/28/2020 1413   PROT 7.0 06/06/2020 0202   ALBUMIN 3.6 06/06/2020 0202   AST 12 (L) 06/06/2020 0202   ALT 15 06/06/2020 0202   ALKPHOS 63 06/06/2020 0202   BILITOT 0.9 06/06/2020 0202   GFRNONAA 20 (L) 06/08/2020 0941   GFRAA 23 (L) 06/08/2020 0941   Lab Results  Component Value Date   CHOL 156 12/22/2019   HDL 41.40 12/22/2019   LDLDIRECT 78.0 08/28/2020   TRIG 251.0 (H) 12/22/2019   CHOLHDL 4 12/22/2019   Lab Results  Component  Value Date   HGBA1C 9.2 (A) 05/26/2020   No results found for: VITAMINB12 Lab Results  Component Value Date   TSH 1.344 02/09/2020     ASSESSMENT AND PLAN 64 y.o. year old male  has a past medical history of  Arthritis, Diabetes mellitus without complication ([place]), History of kidney stones, History of pulmonary embolus (PE), Hypertension, Lipidemia, and Prostate enlargement. here with     ICD-10-CM   1. OSA on CPAP  G47.33 For home use only DME continuous positive airway pressure (CPAP)   Z99.89      [name] is doing well on CPAP therapy. Compliance report reveals excellent daily and 4-hour compliance.  Residual AHI is elevated at 9 events per hour.  He has a significant leak.  I will send orders today for a mask refitting.  I will repeat download in 2 months to assess AHI following mask refitting.  He was encouraged to continue using CPAP nightly and for greater than 4 hours each night. We will update supply orders as indicated. Risks of untreated sleep apnea review and education materials provided. Healthy lifestyle habits encouraged. He will follow up in 6 months, sooner if needed. He verbalizes understanding and agreement with this plan.    Orders Placed This Encounter  Procedures  . For home use only DME continuous positive airway pressure (CPAP)    Mask refitting please, large leak. Patient has beard.    Order Specific Question:   Length of Need    Answer:   Lifetime    Order Specific Question:   Patient has OSA or probable OSA    Answer:   Yes    Order Specific Question:   Is the patient currently using CPAP in the home    Answer:   Yes    Order Specific Question:   Settings    Answer:   Other see comments    Order Specific Question:   CPAP supplies needed    Answer:   Mask, headgear, cushions, filters, heated tubing and water chamber     No orders of the defined types were placed in this encounter.     I spent 15 minutes with the patient. 50% of this time was spent counseling and educating patient on plan of care and medications.    Debbora Presto, FNP-C 09/21/2020, 10:20 AM [facility] [address], [place], [place] [address] [phone]   I  reviewed the above note and documentation by the Nurse Practitioner and agree with the history, exam, assessment and plan as outlined above. I was available for consultation. Star Age, MD, PhD  [facility])

## 2020-09-21 ENCOUNTER — Other Ambulatory Visit: Payer: Self-pay

## 2020-09-21 ENCOUNTER — Ambulatory Visit (INDEPENDENT_AMBULATORY_CARE_PROVIDER_SITE_OTHER): Payer: Medicare Other | Admitting: Family Medicine

## 2020-09-21 ENCOUNTER — Ambulatory Visit: Payer: Medicare Other | Admitting: Family Medicine

## 2020-09-21 ENCOUNTER — Encounter: Payer: Self-pay | Admitting: Family Medicine

## 2020-09-21 VITALS — BP 144/85 | HR 89 | Ht 73.0 in | Wt 249.0 lb

## 2020-09-21 DIAGNOSIS — G4733 Obstructive sleep apnea (adult) (pediatric): Secondary | ICD-10-CM

## 2020-09-21 DIAGNOSIS — Z9989 Dependence on other enabling machines and devices: Secondary | ICD-10-CM | POA: Diagnosis not present

## 2020-09-21 NOTE — Progress Notes (Addendum)
Order for mask refitting (pt has a beard- Large leak) sent to [facility] via community msg. Confirmation received that the order transmitted was successful.

## 2020-09-22 ENCOUNTER — Encounter: Payer: Self-pay | Admitting: Internal Medicine

## 2020-09-22 ENCOUNTER — Ambulatory Visit (INDEPENDENT_AMBULATORY_CARE_PROVIDER_SITE_OTHER): Payer: Medicare Other | Admitting: Internal Medicine

## 2020-09-22 ENCOUNTER — Other Ambulatory Visit: Payer: Self-pay

## 2020-09-22 VITALS — BP 140/80 | HR 90 | Ht 73.0 in | Wt 248.5 lb

## 2020-09-22 DIAGNOSIS — Z794 Long term (current) use of insulin: Secondary | ICD-10-CM

## 2020-09-22 DIAGNOSIS — E1165 Type 2 diabetes mellitus with hyperglycemia: Secondary | ICD-10-CM

## 2020-09-22 LAB — POCT GLYCOSYLATED HEMOGLOBIN (HGB A1C): Hemoglobin A1C: 7.2 % — AB (ref 4.0–5.6)

## 2020-09-22 MED ORDER — HUMULIN 70/30 (70-30) 100 UNIT/ML ~~LOC~~ SUSP
SUBCUTANEOUS | 11 refills | Status: DC
Start: 2020-09-22 — End: 2020-10-11

## 2020-09-22 NOTE — Progress Notes (Signed)
Name: Lela Gell  Age/ Sex: 64 y.o., male   MRN/ DOB: [id number], 01/09/1956     PCP: [name], MD   Reason for Endocrinology Evaluation: Type 2 Diabetes Mellitus  Initial Endocrine Consultative Visit: 02/23/2020    PATIENT IDENTIFIER: Mr. Renold Kozar is a 64 y.o. male with a past medical history of HTN, T2DM and Hx of PE. The patient has followed with Endocrinology clinic since 02/23/2020 for consultative assistance with management of his diabetes.  DIABETIC HISTORY:  Mr. Gilkes was diagnosed with T2DM in 2009, metformin- stopped 12/2019 due to AKI as well as Glyburide. Has been on insulin mix . His hemoglobin A1c has ranged from 7.6% ,peaking at 8.6% in 2021   On her initial visit to  [place] [facility] her A1c was 8.6% , she was on Humulin MIx which we adjusted    Moved from [place]  ~ early 2021 SUBJECTIVE:   During the last visit (05/26/2020): A1c 9.2 %. Adjusted Humulin Mix   Today (09/22/2020): Mr. Dickenson  He checks his blood sugars 2 times daily, preprandial to breakfast and supper. The patient has not had hypoglycemic episodes since the last clinic visit.  Denies nausea or diarrhea.    HOME DIABETES REGIMEN:  Humulin Mix 46 units with Breakfast and 56 units with Supper- Takes 52 units  twice daily      Statin: yes ACE-I/ARB: Intolerant to Lisinopril to AKI    METER DOWNLOAD SUMMARY:  Average Number Tests/Day = 0.4 Overall Mean FS Glucose = 123 Standard Deviation = 36  BG Ranges: Low = 88 High = 177   Hypoglycemic Events/30 Days: BG < 50 = 0 Episodes of symptomatic severe hypoglycemia =0    DIABETIC COMPLICATIONS: Microvascular complications:   CKD, neuropathy  Denies: retinopathy  Last eye exam: Completed 04/2020  Macrovascular complications:    Denies: CAD, PVD, CVA    HISTORY:  Past Medical History:  Past Medical History:  Diagnosis Date  . Arthritis   . Diabetes mellitus without complication ([place])   . History of kidney stones    . History of pulmonary embolus (PE)   . Hypertension   . Lipidemia   . Prostate enlargement    Past Surgical History:  Past Surgical History:  Procedure Laterality Date  . CYSTOSCOPY/URETEROSCOPY/HOLMIUM LASER/STENT PLACEMENT Right 06/07/2020   Procedure: CYSTOSCOPY/URETEROSCOPY/HOLMIUM LASER/STENT PLACEMENT;  Surgeon: Lucas Mallow, MD;  Location: [facility];  Service: Urology;  Laterality: Right;    Social History:  reports that he has never smoked. He has never used smokeless tobacco. He reports current alcohol use of about 1.0 standard drink of alcohol per week. He reports that he does not use drugs. Family History:  Family History  Problem Relation Age of Onset  . Cancer Mother   . Heart disease Father      HOME MEDICATIONS: Allergies as of 09/22/2020      Reactions   Lisinopril Other (See Comments)   Patient states that this medication makes him feel "off". Tolerates losartan.      Medication List       Accurate as of September 22, 2020  8:48 AM. If you have any questions, ask your nurse or doctor.        Accu-Chek Aviva device by Other route. Use as instructed   Accu-Chek Guide test strip Generic drug: glucose blood Use to check blood sugar 3 times daily.E11.65   Accu-Chek Softclix Lancets lancets Use as instructed to check blood sugar 3 times daily Dx is  E11.65   amLODipine 10 MG tablet Commonly known as: NORVASC Take 1 tablet (10 mg total) by mouth daily.   aspirin EC 81 MG tablet Take 81 mg by mouth daily.   cilostazol 100 MG tablet Commonly known as: PLETAL TAKE 1 TABLET BY MOUTH TWICE A DAY   gabapentin 300 MG capsule Commonly known as: NEURONTIN Take 300 mg by mouth 3 (three) times daily as needed (pain).   HumuLIN 70/30 (70-30) 100 UNIT/ML injection Generic drug: insulin NPH-regular Human INJECT 52 UNITS INTO THE SKIN 2 (TWO) TIMES DAILY WITH A MEAL.   Insulin Syringes (Disposable) U-100 1 ML Misc 1 Device by Does not apply route as  directed.   losartan 50 MG tablet Commonly known as: COZAAR Take 1 tablet (50 mg total) by mouth daily.   lovastatin 40 MG tablet Commonly known as: MEVACOR Take 1 tablet (40 mg total) by mouth at bedtime.   methocarbamol 500 MG tablet Commonly known as: Robaxin Take 1 tablet (500 mg total) by mouth every 8 (eight) hours as needed for muscle spasms.   montelukast 10 MG tablet Commonly known as: SINGULAIR TAKE 1 TABLET BY MOUTH EVERY DAY What changed: when to take this   Tylenol 8 Hour Arthritis Pain 650 MG CR tablet Generic drug: acetaminophen Take 650 mg by mouth every 8 (eight) hours as needed for pain.   warfarin 5 MG tablet Commonly known as: COUMADIN Take as directed by the anticoagulation clinic. If you are unsure how to take this medication, talk to your nurse or doctor. Original instructions: TAKE 1 TABLET (5 MG TOTAL) BY MOUTH DAILY. TAKE AS DIRECTED. What changed: additional instructions        OBJECTIVE:   Vital Signs: BP 140/80   Pulse 90   Ht 6\' 1"  (1.854 m)   Wt 248 lb 8 oz (112.7 kg)   SpO2 98%   BMI 32.79 kg/m   Wt Readings from Last 3 Encounters:  09/22/20 248 lb 8 oz (112.7 kg)  09/21/20 249 lb (112.9 kg)  08/28/20 248 lb 3.2 oz (112.6 kg)     Exam: General: Pt appears well and is in NAD  Lungs: Clear with good BS bilat with no rales, rhonchi, or wheezes  Heart: RRR   Extremities: 2+  pretibial edema.   Neuro: MS is good with appropriate affect, pt is alert and Ox3     DM foot exam: 02/23/2020  The skin of the feet is without sores or ulcerations. Toe nails thickened The pedal pulses are 2+ on right and 2+ on left. The sensation is decreased to a screening 5.07, 10 gram monofilament bilaterally    DATA REVIEWED:  Lab Results  Component Value Date   HGBA1C 7.2 (A) 09/22/2020   HGBA1C 9.2 (A) 05/26/2020   HGBA1C 8.6 (H) 02/09/2020   Lab Results  Component Value Date   MICROALBUR 4.9 (H) 08/28/2020   CREATININE 1.92 (H)  08/28/2020   Lab Results  Component Value Date   MICRALBCREAT 4.4 08/28/2020     Lab Results  Component Value Date   CHOL 156 12/22/2019   HDL 41.40 12/22/2019   LDLDIRECT 78.0 08/28/2020   TRIG 251.0 (H) 12/22/2019   CHOLHDL 4 12/22/2019         ASSESSMENT / PLAN / RECOMMENDATIONS:   1) Type 2 Diabetes Mellitus, Optimally controlled, With Neuropathic and CKD III complications - Most recent A1c of 7.2 %. Goal A1c < 7.0 %.    - I have praised the  pt on improved glycemic control , A1c down from 9.2 %  - I have discussed add- on therapy with GLP1 agonists, we discussed renal and cardiovascular benefits but he would like to continue to work on lifestyle changes  - I will try again reducing his AM dose due to tight BG's as low as 74 mg/dL during the day   MEDICATIONS: Humulin Mix  50 units Before breakfast and 52 units before supper    EDUCATION / INSTRUCTIONS:  BG monitoring instructions: Patient is instructed to check his blood sugars 2 times a day, before breakfast and supper.  Call [facility] if: BG persistently < 70 . I reviewed the Rule of 15 for the treatment of hypoglycemia in detail with the patient. Literature supplied.   2) Diabetic complications:   Eye: Does not have known diabetic retinopathy.   Neuro/ Feet: Does  have known diabetic peripheral neuropathy .   Renal: Patient does  have known baseline CKD. He  Is on ARB.      F/U in 4 months     Signed electronically by: Mack Guise, MD  [facility] Endocrinology  [facility] [address] [place], [place] [address] Phone: [phone] FAX: [phone]   CC: [name], MD [place] [address] Phone: [phone]  Fax: [phone]  Return to Endocrinology clinic as below: Future Appointments  Date Time Provider [place]  09/22/2020  8:50 AM Jamillia Closson, Melanie Crazier, MD [facility] None   10/04/2020  2:00 PM [facility] NURSE [facility]  11/28/2020 10:20 AM Elouise Munroe, MD [facility]  03/21/2021  9:30 AM Ubaldo Glassing, Amy, NP [facility] None

## 2020-09-22 NOTE — Patient Instructions (Addendum)
-   You have done amazing, please keep up the good Work !! - Humulin Mix 50 units Before breakfast and 52 units before supper      HOW TO TREAT LOW BLOOD SUGARS (Blood sugar LESS THAN 70 MG/DL)  Please follow the RULE OF 15 for the treatment of hypoglycemia treatment (when your (blood sugars are less than 70 mg/dL)    STEP 1: Take 15 grams of carbohydrates when your blood sugar is low, which includes:   3-4 GLUCOSE TABS  OR  3-4 OZ OF JUICE OR REGULAR SODA OR  ONE TUBE OF GLUCOSE GEL     STEP 2: RECHECK blood sugar in 15 MINUTES STEP 3: If your blood sugar is still low at the 15 minute recheck --> then, go back to STEP 1 and treat AGAIN with another 15 grams of carbohydrates.

## 2020-10-03 ENCOUNTER — Other Ambulatory Visit: Payer: Self-pay

## 2020-10-04 ENCOUNTER — Ambulatory Visit (INDEPENDENT_AMBULATORY_CARE_PROVIDER_SITE_OTHER): Payer: Medicare Other

## 2020-10-04 DIAGNOSIS — Z86711 Personal history of pulmonary embolism: Secondary | ICD-10-CM

## 2020-10-04 DIAGNOSIS — Z7901 Long term (current) use of anticoagulants: Secondary | ICD-10-CM

## 2020-10-04 LAB — POCT INR: INR: 1.2 — AB (ref 2.0–3.0)

## 2020-10-04 NOTE — Progress Notes (Signed)
Medical screening examination/treatment/procedure(s) were performed by the CMA. As primary care provider I was immediately available for consulation/collaboration. I agree with above documentation. Chanel Mckesson, AGNP-C 

## 2020-10-04 NOTE — Progress Notes (Signed)
Pt here for INR check per  Goal INR = 2.0-3.0  Last INR = 2.1  Pt currently takes Coumadin   Date of last Coumadin dose = 10/04/20 @ 9am  Pt denies recent antibiotics, no dietary changes and no unusual bruising / bleeding.  Pt states he has been bruising more lately at the site that he test. Pt also states he did not take his medication on Thanksgiving day and the day after.   INR today = 1.2  Pt advised per Baldo Ash Nche-NP to take 10 mg (2 pills) on 10/05/20 and then continue the previous dose of 5 mg on Sunday, Tuesday, Wednesday, Friday, Saturday and 7.5 mg on Monday, Thursday Repeat INR in 2 weeks.

## 2020-10-04 NOTE — Patient Instructions (Signed)
Health Maintenance Due  Topic Date Due  . Hepatitis C Screening  Never done  . TETANUS/TDAP  Never done  . COLONOSCOPY  Never done    Depression screen [facility] 2/9 08/28/2020 12/17/2019  Decreased Interest 0 0  Down, Depressed, Hopeless 0 0  PHQ - 2 Score 0 0

## 2020-10-08 ENCOUNTER — Emergency Department (HOSPITAL_COMMUNITY): Payer: Medicare Other | Admitting: Certified Registered Nurse Anesthetist

## 2020-10-08 ENCOUNTER — Observation Stay (HOSPITAL_COMMUNITY): Payer: Medicare Other

## 2020-10-08 ENCOUNTER — Encounter (HOSPITAL_COMMUNITY): Payer: Self-pay | Admitting: Emergency Medicine

## 2020-10-08 ENCOUNTER — Encounter (HOSPITAL_COMMUNITY)
Admission: EM | Disposition: A | Discharge status: Home / Self Care | Payer: Self-pay | Source: Home / Self Care | Attending: Internal Medicine

## 2020-10-08 ENCOUNTER — Other Ambulatory Visit: Payer: Self-pay

## 2020-10-08 ENCOUNTER — Emergency Department (HOSPITAL_COMMUNITY): Payer: Medicare Other

## 2020-10-08 ENCOUNTER — Inpatient Hospital Stay (HOSPITAL_COMMUNITY)
Admission: EM | Admit: 2020-10-08 | Discharge: 2020-10-11 | DRG: 660 | Disposition: A | Discharge status: Home / Self Care | Payer: Medicare Other | Attending: Internal Medicine | Admitting: Internal Medicine

## 2020-10-08 DIAGNOSIS — M1712 Unilateral primary osteoarthritis, left knee: Secondary | ICD-10-CM | POA: Diagnosis present

## 2020-10-08 DIAGNOSIS — N1832 Chronic kidney disease, stage 3b: Secondary | ICD-10-CM | POA: Diagnosis present

## 2020-10-08 DIAGNOSIS — N390 Urinary tract infection, site not specified: Secondary | ICD-10-CM

## 2020-10-08 DIAGNOSIS — E872 Acidosis: Secondary | ICD-10-CM | POA: Diagnosis present

## 2020-10-08 DIAGNOSIS — Z794 Long term (current) use of insulin: Secondary | ICD-10-CM

## 2020-10-08 DIAGNOSIS — Z79899 Other long term (current) drug therapy: Secondary | ICD-10-CM

## 2020-10-08 DIAGNOSIS — E869 Volume depletion, unspecified: Secondary | ICD-10-CM | POA: Diagnosis not present

## 2020-10-08 DIAGNOSIS — Z87442 Personal history of urinary calculi: Secondary | ICD-10-CM | POA: Diagnosis not present

## 2020-10-08 DIAGNOSIS — Z7901 Long term (current) use of anticoagulants: Secondary | ICD-10-CM | POA: Diagnosis not present

## 2020-10-08 DIAGNOSIS — E1122 Type 2 diabetes mellitus with diabetic chronic kidney disease: Secondary | ICD-10-CM | POA: Diagnosis present

## 2020-10-08 DIAGNOSIS — G4733 Obstructive sleep apnea (adult) (pediatric): Secondary | ICD-10-CM | POA: Diagnosis not present

## 2020-10-08 DIAGNOSIS — Z20822 Contact with and (suspected) exposure to covid-19: Secondary | ICD-10-CM | POA: Diagnosis present

## 2020-10-08 DIAGNOSIS — I129 Hypertensive chronic kidney disease with stage 1 through stage 4 chronic kidney disease, or unspecified chronic kidney disease: Secondary | ICD-10-CM | POA: Diagnosis present

## 2020-10-08 DIAGNOSIS — Z7982 Long term (current) use of aspirin: Secondary | ICD-10-CM

## 2020-10-08 DIAGNOSIS — N136 Pyonephrosis: Secondary | ICD-10-CM | POA: Diagnosis not present

## 2020-10-08 DIAGNOSIS — Z888 Allergy status to other drugs, medicaments and biological substances status: Secondary | ICD-10-CM | POA: Diagnosis not present

## 2020-10-08 DIAGNOSIS — Z86711 Personal history of pulmonary embolism: Secondary | ICD-10-CM | POA: Diagnosis not present

## 2020-10-08 DIAGNOSIS — N201 Calculus of ureter: Secondary | ICD-10-CM | POA: Diagnosis not present

## 2020-10-08 DIAGNOSIS — R52 Pain, unspecified: Secondary | ICD-10-CM

## 2020-10-08 DIAGNOSIS — N179 Acute kidney failure, unspecified: Secondary | ICD-10-CM | POA: Diagnosis not present

## 2020-10-08 DIAGNOSIS — E860 Dehydration: Secondary | ICD-10-CM | POA: Diagnosis present

## 2020-10-08 HISTORY — PX: CYSTOSCOPY W/ URETERAL STENT PLACEMENT: SHX1429

## 2020-10-08 LAB — CBC WITH DIFFERENTIAL/PLATELET
Abs Immature Granulocytes: 0.06 10*3/uL (ref 0.00–0.07)
Basophils Absolute: 0 10*3/uL (ref 0.0–0.1)
Basophils Relative: 0 %
Eosinophils Absolute: 0.3 10*3/uL (ref 0.0–0.5)
Eosinophils Relative: 3 %
HCT: 37.9 % — ABNORMAL LOW (ref 39.0–52.0)
Hemoglobin: 11.9 g/dL — ABNORMAL LOW (ref 13.0–17.0)
Immature Granulocytes: 1 %
Lymphocytes Relative: 11 %
Lymphs Abs: 1.1 10*3/uL (ref 0.7–4.0)
MCH: 29 pg (ref 26.0–34.0)
MCHC: 31.4 g/dL (ref 30.0–36.0)
MCV: 92.2 fL (ref 80.0–100.0)
Monocytes Absolute: 1.1 10*3/uL — ABNORMAL HIGH (ref 0.1–1.0)
Monocytes Relative: 10 %
Neutro Abs: 8 10*3/uL — ABNORMAL HIGH (ref 1.7–7.7)
Neutrophils Relative %: 75 %
Platelets: 232 10*3/uL (ref 150–400)
RBC: 4.11 MIL/uL — ABNORMAL LOW (ref 4.22–5.81)
RDW: 14 % (ref 11.5–15.5)
WBC: 10.6 10*3/uL — ABNORMAL HIGH (ref 4.0–10.5)
nRBC: 0 % (ref 0.0–0.2)

## 2020-10-08 LAB — URINALYSIS, ROUTINE W REFLEX MICROSCOPIC
Bilirubin Urine: NEGATIVE
Glucose, UA: NEGATIVE mg/dL
Ketones, ur: NEGATIVE mg/dL
Nitrite: POSITIVE — AB
Protein, ur: 30 mg/dL — AB
RBC / HPF: >50 RBC/hpf — ABNORMAL HIGH (ref 0–5)
Specific Gravity, Urine: 1.013 (ref 1.005–1.030)
WBC, UA: >50 WBC/hpf — ABNORMAL HIGH (ref 0–5)
pH: 5 (ref 5.0–8.0)

## 2020-10-08 LAB — GLUCOSE, CAPILLARY
Glucose-Capillary: 101 mg/dL — ABNORMAL HIGH (ref 70–99)
Glucose-Capillary: 107 mg/dL — ABNORMAL HIGH (ref 70–99)
Glucose-Capillary: 110 mg/dL — ABNORMAL HIGH (ref 70–99)
Glucose-Capillary: 210 mg/dL — ABNORMAL HIGH (ref 70–99)

## 2020-10-08 LAB — COMPREHENSIVE METABOLIC PANEL
ALT: 24 U/L (ref 0–44)
AST: 26 U/L (ref 15–41)
Albumin: 3.2 g/dL — ABNORMAL LOW (ref 3.5–5.0)
Alkaline Phosphatase: 78 U/L (ref 38–126)
Anion gap: 13 (ref 5–15)
BUN: 35 mg/dL — ABNORMAL HIGH (ref 8–23)
CO2: 16 mmol/L — ABNORMAL LOW (ref 22–32)
Calcium: 8.6 mg/dL — ABNORMAL LOW (ref 8.9–10.3)
Chloride: 109 mmol/L (ref 98–111)
Creatinine, Ser: 2.29 mg/dL — ABNORMAL HIGH (ref 0.61–1.24)
GFR, Estimated: 31 mL/min — ABNORMAL LOW (ref >60)
Glucose, Bld: 101 mg/dL — ABNORMAL HIGH (ref 70–99)
Potassium: 5.1 mmol/L (ref 3.5–5.1)
Sodium: 138 mmol/L (ref 135–145)
Total Bilirubin: 0.9 mg/dL (ref 0.3–1.2)
Total Protein: 7.1 g/dL (ref 6.5–8.1)

## 2020-10-08 LAB — RESP PANEL BY RT-PCR (FLU A&B, COVID) ARPGX2
Influenza A by PCR: NEGATIVE
Influenza B by PCR: NEGATIVE
SARS Coronavirus 2 by RT PCR: NEGATIVE

## 2020-10-08 LAB — PROTIME-INR
INR: 1.5 — ABNORMAL HIGH (ref 0.8–1.2)
Prothrombin Time: 17.4 seconds — ABNORMAL HIGH (ref 11.4–15.2)

## 2020-10-08 SURGERY — CYSTOSCOPY, WITH RETROGRADE PYELOGRAM AND URETERAL STENT INSERTION
Anesthesia: General | Site: Ureter | Laterality: Left

## 2020-10-08 SURGERY — CYSTOSCOPY WITH RETROGRADE URETHROGRAM
Anesthesia: General

## 2020-10-08 MED ORDER — 0.9 % SODIUM CHLORIDE (POUR BTL) OPTIME
TOPICAL | Status: DC | PRN
  Administered 2020-10-08: 1000 mL

## 2020-10-08 MED ORDER — CHLORHEXIDINE GLUCONATE CLOTH 2 % EX PADS
6.0000 | MEDICATED_PAD | Freq: Every day | CUTANEOUS | Status: DC
  Administered 2020-10-10 – 2020-10-11 (×2): 6 via TOPICAL

## 2020-10-08 MED ORDER — SODIUM CHLORIDE 0.9 % IV SOLN: INTRAVENOUS | Status: DC

## 2020-10-08 MED ORDER — PHENYLEPHRINE HCL-NACL 10-0.9 MG/250ML-% IV SOLN
INTRAVENOUS | Status: DC | PRN
  Administered 2020-10-08: 30 ug/min via INTRAVENOUS

## 2020-10-08 MED ORDER — CHLORHEXIDINE GLUCONATE 0.12 % MT SOLN
OROMUCOSAL | Status: AC
  Administered 2020-10-08: 15 mL via OROMUCOSAL
  Filled 2020-10-08: qty 15

## 2020-10-08 MED ORDER — ASPIRIN EC 81 MG PO TBEC
81.0000 mg | DELAYED_RELEASE_TABLET | Freq: Every day | ORAL | Status: DC
  Administered 2020-10-08 – 2020-10-11 (×4): 81 mg via ORAL
  Filled 2020-10-08 (×4): qty 1

## 2020-10-08 MED ORDER — SODIUM CHLORIDE 0.9 % IV BOLUS
500.0000 mL | Freq: Once | INTRAVENOUS | Status: AC
  Administered 2020-10-08: 500 mL via INTRAVENOUS

## 2020-10-08 MED ORDER — ACETAMINOPHEN 650 MG RE SUPP: 650.0000 mg | Freq: Four times a day (QID) | RECTAL | Status: DC | PRN

## 2020-10-08 MED ORDER — FENTANYL CITRATE (PF) 250 MCG/5ML IJ SOLN
INTRAMUSCULAR | Status: DC | PRN
  Administered 2020-10-08: 50 ug via INTRAVENOUS

## 2020-10-08 MED ORDER — MIDAZOLAM HCL 2 MG/2ML IJ SOLN
INTRAMUSCULAR | Status: AC
  Filled 2020-10-08: qty 2

## 2020-10-08 MED ORDER — ONDANSETRON HCL 4 MG PO TABS: 4.0000 mg | ORAL_TABLET | Freq: Four times a day (QID) | ORAL | Status: DC | PRN

## 2020-10-08 MED ORDER — ONDANSETRON HCL 4 MG/2ML IJ SOLN
INTRAMUSCULAR | Status: DC | PRN
  Administered 2020-10-08: 4 mg via INTRAVENOUS

## 2020-10-08 MED ORDER — METHOCARBAMOL 500 MG PO TABS: 500.0000 mg | ORAL_TABLET | Freq: Three times a day (TID) | ORAL | Status: DC | PRN

## 2020-10-08 MED ORDER — INSULIN ASPART 100 UNIT/ML ~~LOC~~ SOLN
0.0000 [IU] | Freq: Every day | SUBCUTANEOUS | Status: DC
  Administered 2020-10-08: 2 [IU] via SUBCUTANEOUS

## 2020-10-08 MED ORDER — LIDOCAINE HCL URETHRAL/MUCOSAL 2 % EX GEL
CUTANEOUS | Status: AC
  Filled 2020-10-08: qty 22

## 2020-10-08 MED ORDER — INSULIN ASPART 100 UNIT/ML ~~LOC~~ SOLN
0.0000 [IU] | Freq: Three times a day (TID) | SUBCUTANEOUS | Status: DC
  Administered 2020-10-09 (×2): 3 [IU] via SUBCUTANEOUS
  Administered 2020-10-09: 7 [IU] via SUBCUTANEOUS
  Administered 2020-10-10 – 2020-10-11 (×3): 1 [IU] via SUBCUTANEOUS

## 2020-10-08 MED ORDER — MIDAZOLAM HCL 2 MG/2ML IJ SOLN
INTRAMUSCULAR | Status: DC | PRN
  Administered 2020-10-08: 2 mg via INTRAVENOUS

## 2020-10-08 MED ORDER — ORAL CARE MOUTH RINSE: 15.0000 mL | Freq: Once | OROMUCOSAL | Status: AC

## 2020-10-08 MED ORDER — ONDANSETRON HCL 4 MG/2ML IJ SOLN: 4.0000 mg | Freq: Four times a day (QID) | INTRAMUSCULAR | Status: DC | PRN

## 2020-10-08 MED ORDER — TAMSULOSIN HCL 0.4 MG PO CAPS
0.4000 mg | ORAL_CAPSULE | Freq: Once | ORAL | Status: AC
  Administered 2020-10-08: 0.4 mg via ORAL
  Filled 2020-10-08: qty 1

## 2020-10-08 MED ORDER — MORPHINE SULFATE (PF) 4 MG/ML IV SOLN
4.0000 mg | Freq: Once | INTRAVENOUS | Status: AC
  Administered 2020-10-08: 4 mg via INTRAVENOUS
  Filled 2020-10-08: qty 1

## 2020-10-08 MED ORDER — SODIUM CHLORIDE 0.9 % IV SOLN
1.0000 g | INTRAVENOUS | Status: DC
  Administered 2020-10-09 – 2020-10-10 (×2): 1 g via INTRAVENOUS
  Filled 2020-10-08 (×3): qty 10

## 2020-10-08 MED ORDER — MONTELUKAST SODIUM 10 MG PO TABS
10.0000 mg | ORAL_TABLET | Freq: Every day | ORAL | Status: DC
  Administered 2020-10-08 – 2020-10-10 (×3): 10 mg via ORAL
  Filled 2020-10-08 (×4): qty 1

## 2020-10-08 MED ORDER — GABAPENTIN 300 MG PO CAPS
300.0000 mg | ORAL_CAPSULE | Freq: Three times a day (TID) | ORAL | Status: DC | PRN
  Administered 2020-10-09: 300 mg via ORAL
  Filled 2020-10-08: qty 1

## 2020-10-08 MED ORDER — WARFARIN SODIUM 5 MG PO TABS: 5.0000 mg | ORAL_TABLET | Freq: Every day | ORAL | Status: DC

## 2020-10-08 MED ORDER — DEXAMETHASONE SODIUM PHOSPHATE 10 MG/ML IJ SOLN
INTRAMUSCULAR | Status: DC | PRN
  Administered 2020-10-08: 10 mg via INTRAVENOUS

## 2020-10-08 MED ORDER — SODIUM CHLORIDE 0.9 % IV SOLN: 1.0000 g | Freq: Once | INTRAVENOUS | Status: DC

## 2020-10-08 MED ORDER — WATER FOR IRRIGATION, STERILE IR SOLN
Status: DC | PRN
  Administered 2020-10-08: 3000 mL

## 2020-10-08 MED ORDER — SODIUM CHLORIDE 0.9 % IV SOLN
1.0000 g | Freq: Once | INTRAVENOUS | Status: AC
  Administered 2020-10-08: 1 g via INTRAVENOUS
  Filled 2020-10-08: qty 10

## 2020-10-08 MED ORDER — FENTANYL CITRATE (PF) 100 MCG/2ML IJ SOLN: 25.0000 ug | INTRAMUSCULAR | Status: DC | PRN

## 2020-10-08 MED ORDER — LIDOCAINE 2% (20 MG/ML) 5 ML SYRINGE
INTRAMUSCULAR | Status: DC | PRN
  Administered 2020-10-08: 60 mg via INTRAVENOUS

## 2020-10-08 MED ORDER — IOHEXOL 300 MG/ML  SOLN
INTRAMUSCULAR | Status: DC | PRN
  Administered 2020-10-08: 3 mL via URETHRAL

## 2020-10-08 MED ORDER — PRAVASTATIN SODIUM 40 MG PO TABS
40.0000 mg | ORAL_TABLET | Freq: Every day | ORAL | Status: DC
  Administered 2020-10-08 – 2020-10-10 (×3): 40 mg via ORAL
  Filled 2020-10-08 (×3): qty 1

## 2020-10-08 MED ORDER — CHLORHEXIDINE GLUCONATE 0.12 % MT SOLN: 15.0000 mL | Freq: Once | OROMUCOSAL | Status: AC

## 2020-10-08 MED ORDER — EPHEDRINE SULFATE 50 MG/ML IJ SOLN
INTRAMUSCULAR | Status: DC | PRN
  Administered 2020-10-08: 10 mg via INTRAVENOUS

## 2020-10-08 MED ORDER — HYDRALAZINE HCL 25 MG PO TABS: 25.0000 mg | ORAL_TABLET | Freq: Four times a day (QID) | ORAL | Status: DC | PRN

## 2020-10-08 MED ORDER — HYDROMORPHONE HCL 1 MG/ML IJ SOLN: 0.5000 mg | INTRAMUSCULAR | Status: DC | PRN

## 2020-10-08 MED ORDER — HYDROMORPHONE HCL 1 MG/ML IJ SOLN
1.0000 mg | Freq: Once | INTRAMUSCULAR | Status: AC
  Administered 2020-10-08: 1 mg via INTRAVENOUS
  Filled 2020-10-08: qty 1

## 2020-10-08 MED ORDER — FENTANYL CITRATE (PF) 250 MCG/5ML IJ SOLN
INTRAMUSCULAR | Status: AC
  Filled 2020-10-08: qty 5

## 2020-10-08 MED ORDER — PHENYLEPHRINE HCL (PRESSORS) 10 MG/ML IV SOLN
INTRAVENOUS | Status: DC | PRN
  Administered 2020-10-08: 80 ug via INTRAVENOUS

## 2020-10-08 MED ORDER — PROPOFOL 10 MG/ML IV BOLUS
INTRAVENOUS | Status: DC | PRN
  Administered 2020-10-08: 200 mg via INTRAVENOUS

## 2020-10-08 MED ORDER — TAMSULOSIN HCL 0.4 MG PO CAPS
0.4000 mg | ORAL_CAPSULE | Freq: Every day | ORAL | Status: DC
  Administered 2020-10-09 – 2020-10-11 (×3): 0.4 mg via ORAL
  Filled 2020-10-08 (×3): qty 1

## 2020-10-08 MED ORDER — AMLODIPINE BESYLATE 10 MG PO TABS
10.0000 mg | ORAL_TABLET | Freq: Every day | ORAL | Status: DC
  Administered 2020-10-08: 10 mg via ORAL
  Filled 2020-10-08: qty 2

## 2020-10-08 MED ORDER — CILOSTAZOL 100 MG PO TABS
100.0000 mg | ORAL_TABLET | Freq: Two times a day (BID) | ORAL | Status: DC
  Administered 2020-10-08 – 2020-10-11 (×6): 100 mg via ORAL
  Filled 2020-10-08 (×8): qty 1

## 2020-10-08 MED ORDER — ALBUMIN HUMAN 5 % IV SOLN: INTRAVENOUS | Status: DC | PRN

## 2020-10-08 MED ORDER — ACETAMINOPHEN 325 MG PO TABS
650.0000 mg | ORAL_TABLET | Freq: Four times a day (QID) | ORAL | Status: DC | PRN
  Administered 2020-10-09: 650 mg via ORAL
  Filled 2020-10-08: qty 2

## 2020-10-08 SURGICAL SUPPLY — 22 items
BAG URINE DRAIN 2000ML AR STRL (UROLOGICAL SUPPLIES) ×2 IMPLANT
BAG URO CATCHER STRL LF (MISCELLANEOUS) ×2 IMPLANT
CATH FOLEY 2WAY SLVR  5CC 16FR (CATHETERS) ×1
CATH FOLEY 2WAY SLVR 5CC 16FR (CATHETERS) ×1 IMPLANT
CATH INTERMIT  6FR 70CM (CATHETERS) IMPLANT
GLOVE BIOGEL M 8.0 STRL (GLOVE) ×4 IMPLANT
GOWN STRL REUS W/ TWL LRG LVL3 (GOWN DISPOSABLE) ×1 IMPLANT
GOWN STRL REUS W/ TWL XL LVL3 (GOWN DISPOSABLE) ×2 IMPLANT
GOWN STRL REUS W/TWL LRG LVL3 (GOWN DISPOSABLE) ×1
GOWN STRL REUS W/TWL XL LVL3 (GOWN DISPOSABLE) ×2
GUIDEWIRE STR DUAL SENSOR (WIRE) ×2 IMPLANT
KIT TURNOVER KIT B (KITS) ×2 IMPLANT
MANIFOLD NEPTUNE II (INSTRUMENTS) ×2 IMPLANT
NS IRRIG 1000ML POUR BTL (IV SOLUTION) IMPLANT
PACK CYSTO (CUSTOM PROCEDURE TRAY) ×2 IMPLANT
STENT ×2 IMPLANT
STENT URET 6FRX26 CONTOUR (STENTS) ×2 IMPLANT
SYPHON OMNI JUG (MISCELLANEOUS) IMPLANT
SYR 10ML LL (SYRINGE) ×2 IMPLANT
TOWEL GREEN STERILE FF (TOWEL DISPOSABLE) ×2 IMPLANT
TUBE CONNECTING 12X1/4 (SUCTIONS) IMPLANT
WATER STERILE IRR 3000ML UROMA (IV SOLUTION) ×2 IMPLANT

## 2020-10-08 NOTE — Anesthesia Postprocedure Evaluation (Signed)
Anesthesia Post Note  Patient: [name]  Procedure(s) Performed: CYSTOSCOPY WITH RETROGRADE PYELOGRAM/URETERAL STENT PLACEMENT (Left Ureter)     Patient location during evaluation: PACU Anesthesia Type: General Level of consciousness: awake Pain management: pain level controlled Vital Signs Assessment: post-procedure vital signs reviewed and stable Respiratory status: spontaneous breathing Cardiovascular status: stable Postop Assessment: no apparent nausea or vomiting Anesthetic complications: no   No complications documented.  Last Vitals:  Vitals:   10/08/20 1630 10/08/20 1645  BP: 111/61 113/62  Pulse: (!) 115 (!) 110  Resp: 16 17  Temp: 37.1 C   SpO2: 99% 98%    Last Pain:  Vitals:   10/08/20 1645  TempSrc:   PainSc: Asleep                 Meg Niemeier

## 2020-10-08 NOTE — ED Notes (Signed)
Pt returned from CT. Pt placed back on monitors

## 2020-10-08 NOTE — ED Notes (Signed)
Pt signed informed consent. Placed with pt at bedside.

## 2020-10-08 NOTE — Consult Note (Signed)
Urology Consult Note   Requesting Attending Physician:  Blanchie Dessert, MD Service Providing Consult: Urology   Reason for Consult:  Nephrolithiasis  HPI: [name] is seen in consultation for reasons noted above at the request of Blanchie Dessert, MD for evaluation of obstructing stone in the presence of UTI  He notes that he has had symptoms for the last day, including flank pain, suprapubic pain, dysuria similar to previous stone episodes.  The patient complains of lower urinary tract symptoms, specifically frequency, dysuria. The patient has a history of and nausea and chills.  He has a previous history of kidney stones which includes a recent cystoscopy, ureteroscopy and stent placement for a 78mm obstructing stone 06/2020.  He denies a history of TUMS, current use of calcium supplementation, history of diarrhea, history of GI surgery, or history of gout.    Past Medical History: Past Medical History:  Diagnosis Date  . Arthritis   . Diabetes mellitus without complication ([place])   . History of kidney stones   . History of pulmonary embolus (PE)   . Hypertension   . Lipidemia   . Prostate enlargement     Past Surgical History:  Past Surgical History:  Procedure Laterality Date  . CYSTOSCOPY/URETEROSCOPY/HOLMIUM LASER/STENT PLACEMENT Right 06/07/2020   Procedure: CYSTOSCOPY/URETEROSCOPY/HOLMIUM LASER/STENT PLACEMENT;  Surgeon: Lucas Mallow, MD;  Location: [facility];  Service: Urology;  Laterality: Right;    Medication: No current facility-administered medications for this encounter.   Current Outpatient Medications  Medication Sig Dispense Refill  . Accu-Chek Softclix Lancets lancets Use as instructed to check blood sugar 3 times daily Dx is E11.65 100 each 2  . acetaminophen (TYLENOL 8 HOUR ARTHRITIS PAIN) 650 MG CR tablet Take 650 mg by mouth every 8 (eight) hours as needed for pain.    [name] amLODipine (NORVASC) 10 MG tablet Take 1 tablet (10 mg total) by mouth daily. 90  tablet 3  . aspirin EC 81 MG tablet Take 81 mg by mouth daily.    . Blood Glucose Monitoring Suppl (ACCU-CHEK AVIVA) device by Other route. Use as instructed    . cilostazol (PLETAL) 100 MG tablet TAKE 1 TABLET BY MOUTH TWICE A DAY 180 tablet 1  . gabapentin (NEURONTIN) 300 MG capsule Take 300 mg by mouth 3 (three) times daily as needed (pain).     [name] glucose blood (ACCU-CHEK GUIDE) test strip Use to check blood sugar 3 times daily.E11.65 100 each 12  . insulin NPH-regular Human (HUMULIN 70/30) (70-30) 100 UNIT/ML injection Inject 50 Units into the skin daily with breakfast AND 52 Units daily with supper. INJECT 52 UNITS INTO THE SKIN 2 (TWO) TIMES DAILY WITH A MEAL.[name] 40 mL 11  . Insulin Syringes, Disposable, U-100 1 ML MISC 1 Device by Does not apply route as directed. 100 each 11  . losartan (COZAAR) 50 MG tablet Take 1 tablet (50 mg total) by mouth daily. 90 tablet 3  . lovastatin (MEVACOR) 40 MG tablet Take 1 tablet (40 mg total) by mouth at bedtime. 90 tablet 2  . methocarbamol (ROBAXIN) 500 MG tablet Take 1 tablet (500 mg total) by mouth every 8 (eight) hours as needed for muscle spasms. 30 tablet 0  . montelukast (SINGULAIR) 10 MG tablet TAKE 1 TABLET BY MOUTH EVERY DAY (Patient taking differently: Take 10 mg by mouth at bedtime. ) 90 tablet 1  . warfarin (COUMADIN) 5 MG tablet TAKE 1 TABLET (5 MG TOTAL) BY MOUTH DAILY. TAKE AS DIRECTED. (Patient taking differently:  Take 5 mg by mouth daily. ) 90 tablet 2    Allergies: Allergies  Allergen Reactions  . Lisinopril Other (See Comments)    Patient states that this medication makes him feel "off". Tolerates losartan.    Social History: Social History   Tobacco Use  . Smoking status: Never Smoker  . Smokeless tobacco: Never Used  Vaping Use  . Vaping Use: Never used  Substance Use Topics  . Alcohol use: Yes    Alcohol/week: 1.0 standard drink    Types: 1 Cans of beer per week    Comment: social  . Drug use: Never    Family  History Family History  Problem Relation Age of Onset  . Cancer Mother   . Heart disease Father     Review of Systems 10 systems were reviewed and are negative except as noted specifically in the HPI.  Objective   Vital signs in last 24 hours: BP (!) 152/88 (BP Location: Right Arm)   Pulse (!) 104   Temp 98.7 F (37.1 C) (Oral)   Resp 13   Ht 6\' 1"  (1.854 m)   Wt 111.1 kg   SpO2 100%   BMI 32.32 kg/m   Physical Exam General: NAD, A&O, resting, appropriate HEENT: [place]/AT, EOMI, MMM Pulmonary: Normal work of breathing Cardiovascular: HDS, adequate peripheral perfusion Abdomen: Soft, NTTP, nondistended. GU: Mild left CVA tenderness Extremities: warm and well perfused Neuro: Appropriate, no focal neurological deficits  Most Recent Labs: Lab Results  Component Value Date   WBC 10.6 (H) 10/08/2020   HGB 11.9 (L) 10/08/2020   HCT 37.9 (L) 10/08/2020   PLT 232 10/08/2020    Lab Results  Component Value Date   NA 138 10/08/2020   K 5.1 10/08/2020   CL 109 10/08/2020   CO2 16 (L) 10/08/2020   BUN 35 (H) 10/08/2020   CREATININE 2.29 (H) 10/08/2020   CALCIUM 8.6 (L) 10/08/2020   MG 2.1 02/09/2020   PHOS 3.3 02/09/2020    Lab Results  Component Value Date   INR 1.5 (H) 10/08/2020     Urine Culture: @[id number] (laburin,org,[id number],[id number])@   IMAGING: CT Renal Stone Study  Result Date: 10/08/2020 CLINICAL DATA:  Left-sided flank pain EXAM: CT ABDOMEN AND PELVIS WITHOUT CONTRAST TECHNIQUE: Multidetector CT imaging of the abdomen and pelvis was performed following the standard protocol without IV contrast. COMPARISON:  06/06/2020 FINDINGS: Lower chest: No acute abnormality. Hepatobiliary: No focal liver abnormality is seen. No gallstones, gallbladder wall thickening, or biliary dilatation. Pancreas: Unremarkable. No pancreatic ductal dilatation or surrounding inflammatory changes. Spleen: Normal in size without focal abnormality. Adrenals/Urinary Tract: Adrenal glands  are within normal limits. Right kidney is unremarkable. Left kidney demonstrates nonobstructing renal calculi measuring approximately 38mm. Additionally mild left-sided hydronephrosis is noted secondary to a small 2-3 mm left mid ureteral stone. The bladder is within normal limits. Left renal cyst is again noted. Stomach/Bowel: Appendix is within normal limits. No obstructive or inflammatory changes of large or small bowel are seen. No gastric abnormality is noted. Vascular/Lymphatic: Aortic atherosclerosis. No enlarged abdominal or pelvic lymph nodes. Reproductive: Prostate is unremarkable. Other: No abdominal wall hernia or abnormality. No abdominopelvic ascites. Musculoskeletal: No acute or significant osseous findings. IMPRESSION: 2-3 mm partially obstructing left mid ureteral stone. Nonobstructing renal stones are noted on the left the largest of which measures approximately 5 mm. No other focal abnormality is noted. Electronically Signed   By: Inez Catalina M.D.   On: 10/08/2020 09:34    ------  Assessment:  64 y.o. male with history of nephrolithiasis who presented with left flank pain, dysuria and chills and CT that demonstrates 52mm left obstructing stone with mild associated hydronephrosis.   Concerning for infection given UA findings  We discussed the indications for acute intervention including infected obstruction, bilateral ureteral obstruction or unilateral obstruction of solitary kidney as well as other less urgent indications for decompression which would included intractable pain, N/V, and acute renal injury. We also discussed the possible inability to place a ureteral stent in which case, the next intervention recommended would be a percutaneous nephrostomy tube. Given concern for infection is present, plan for urgent left ureteral stent placement.   Recommendations: 1. Plan for left ureteral stent placement today 2. Case posted; Urology discussed with OR front desk 3. Keep patient  NPO 4. Consent obtained from patient, will be verified in pre op 5. Send urine culture at this time, prior to OR 6. Start ceftriaxone for antibiotics 7. Fluid resuscitation as needed 8. Negative COVID test    Thank you for this consult. Please contact the urology consult pager with any further questions/concerns.

## 2020-10-08 NOTE — Anesthesia Procedure Notes (Signed)
Procedure Name: LMA Insertion Date/Time: 10/08/2020 3:58 PM Performed by: Clearnce Sorrel, CRNA Pre-anesthesia Checklist: Patient identified, Emergency Drugs available, Suction available, Patient being monitored and Timeout performed Patient Re-evaluated:Patient Re-evaluated prior to induction Oxygen Delivery Method: Circle system utilized Preoxygenation: Pre-oxygenation with 100% oxygen Induction Type: IV induction LMA: LMA inserted LMA Size: 5.0 Number of attempts: 1 Placement Confirmation: positive ETCO2 and breath sounds checked- equal and bilateral Tube secured with: Tape Dental Injury: Teeth and Oropharynx as per pre-operative assessment

## 2020-10-08 NOTE — ED Triage Notes (Signed)
C/o L flank pain since 4am.  Also reports pain across lower back but L flank is worse.  Decreased urine flow x 3 days.  Denies hematuria.  History of kidney stones.

## 2020-10-08 NOTE — H&P (Signed)
History and Physical    Reynold Mantell RXV:[id number] DOB: 02/20/1956 DOA: 10/08/2020  PCP: [name], MD (Confirm with patient/family/NH records and if not entered, this has to be entered at [facility] point of entry) Patient coming from: Home  I have personally briefly reviewed patient's old medical records in [id number]  Chief Complaint: Left flank pain  HPI: Merrit Friesen is a 64 y.o. male with medical history significant of kidney stone, CKD stage II, HTN, IDDM, HLD, PE on Coumadin, PVD, presented with new onset of left-sided flank pain and urinary difficulty for 3 days.  Patient has had weak stream of urine and decreased urine output last 3 days, started with dysuria.  No fever.  This morning, patient woke up with severe left-sided flank pain, 10/10, sharp like radiating to umbilical area, and associated with nauseous but no vomiting, denied any hematuria. ED Course: CT abdomen: Obstructing left mid ureter 2 to 3 mm stone.  Bicarb 16, creatinine 2.2.  UA cloudy, compatible with UTI.  Review of Systems: As per HPI otherwise 14 point review of systems negative.    Past Medical History:  Diagnosis Date  . Arthritis   . Diabetes mellitus without complication ([place])   . History of kidney stones   . History of pulmonary embolus (PE)   . Hypertension   . Lipidemia   . Prostate enlargement     Past Surgical History:  Procedure Laterality Date  . CYSTOSCOPY/URETEROSCOPY/HOLMIUM LASER/STENT PLACEMENT Right 06/07/2020   Procedure: CYSTOSCOPY/URETEROSCOPY/HOLMIUM LASER/STENT PLACEMENT;  Surgeon: Lucas Mallow, MD;  Location: [facility];  Service: Urology;  Laterality: Right;     reports that he has never smoked. He has never used smokeless tobacco. He reports current alcohol use of about 1.0 standard drink of alcohol per week. He reports that he does not use drugs.  Allergies  Allergen Reactions  . Lisinopril Other (See Comments)    Patient states that this medication makes him  feel "off". Tolerates losartan.    Family History  Problem Relation Age of Onset  . Cancer Mother   . Heart disease Father     Prior to Admission medications   Medication Sig Start Date End Date Taking? Authorizing Provider  Accu-Chek Softclix Lancets lancets Use as instructed to check blood sugar 3 times daily Dx is E11.65 09/18/20   Shamleffer, Melanie Crazier, MD  acetaminophen (TYLENOL 8 HOUR ARTHRITIS PAIN) 650 MG CR tablet Take 650 mg by mouth every 8 (eight) hours as needed for pain.    [provider]  amLODipine (NORVASC) 10 MG tablet Take 1 tablet (10 mg total) by mouth daily. 02/18/20 06/06/21  Elouise Munroe, MD  aspirin EC 81 MG tablet Take 81 mg by mouth daily.    [provider]  Blood Glucose Monitoring Suppl (ACCU-CHEK AVIVA) device by Other route. Use as instructed    [provider]  cilostazol (PLETAL) 100 MG tablet TAKE 1 TABLET BY MOUTH TWICE A DAY 07/01/20   [name], MD  gabapentin (NEURONTIN) 300 MG capsule Take 300 mg by mouth 3 (three) times daily as needed (pain).     [provider]  glucose blood (ACCU-CHEK GUIDE) test strip Use to check blood sugar 3 times daily.E11.65 05/22/20   Shamleffer, Melanie Crazier, MD  insulin NPH-regular Human (HUMULIN 70/30) (70-30) 100 UNIT/ML injection Inject 50 Units into the skin daily with breakfast AND 52 Units daily with supper. INJECT 52 UNITS INTO THE SKIN 2 (TWO) TIMES DAILY WITH  A MEAL.. 09/22/20   Shamleffer, Melanie Crazier, MD  Insulin Syringes, Disposable, U-100 1 ML MISC 1 Device by Does not apply route as directed. 02/23/20   Shamleffer, Melanie Crazier, MD  losartan (COZAAR) 50 MG tablet Take 1 tablet (50 mg total) by mouth daily. 08/28/20   [name], MD  lovastatin (MEVACOR) 40 MG tablet Take 1 tablet (40 mg total) by mouth at bedtime. 04/07/20   [name], MD  methocarbamol (ROBAXIN) 500 MG tablet Take 1 tablet (500 mg total) by mouth every 8  (eight) hours as needed for muscle spasms. 03/30/20   [name], MD  montelukast (SINGULAIR) 10 MG tablet TAKE 1 TABLET BY MOUTH EVERY DAY Patient taking differently: Take 10 mg by mouth at bedtime.  05/19/20   [name], MD  warfarin (COUMADIN) 5 MG tablet TAKE 1 TABLET (5 MG TOTAL) BY MOUTH DAILY. TAKE AS DIRECTED. Patient taking differently: Take 5 mg by mouth daily.  03/21/20   [name], MD    Physical Exam: Vitals:   10/08/20 1030 10/08/20 1051 10/08/20 1200 10/08/20 1215  BP: (!) 149/92 (!) 152/88 (!) 159/88 (!) 149/81  Pulse: 99 (!) 104 (!) 109 (!) 108  Resp: 15 13 20 20   Temp:      TempSrc:      SpO2: 100% 100% 99% 96%  Weight:      Height:        Constitutional: NAD, calm, comfortable Vitals:   10/08/20 1030 10/08/20 1051 10/08/20 1200 10/08/20 1215  BP: (!) 149/92 (!) 152/88 (!) 159/88 (!) 149/81  Pulse: 99 (!) 104 (!) 109 (!) 108  Resp: 15 13 20 20   Temp:      TempSrc:      SpO2: 100% 100% 99% 96%  Weight:      Height:       Eyes: PERRL, lids and conjunctivae normal ENMT: Mucous membranes are dry. Posterior pharynx clear of any exudate or lesions.Normal dentition.  Neck: normal, supple, no masses, no thyromegaly Respiratory: clear to auscultation bilaterally, no wheezing, no crackles. Normal respiratory effort. No accessory muscle use.  Cardiovascular: Regular rate and rhythm, no murmurs / rubs / gallops. No extremity edema. 2+ pedal pulses. No carotid bruits.  Abdomen: Left CVA tenderness, no masses palpated. No hepatosplenomegaly. Bowel sounds positive.  Musculoskeletal: no clubbing / cyanosis. No joint deformity upper and lower extremities. Good ROM, no contractures. Normal muscle tone.  Skin: no rashes, lesions, ulcers. No induration Neurologic: CN 2-12 grossly intact. Sensation intact, DTR normal. Strength 5/5 in all 4.  Psychiatric: Normal judgment and insight. Alert and oriented x 3. Normal mood.     Labs on  Admission: I have personally reviewed following labs and imaging studies  CBC: Recent Labs  Lab 10/08/20 0833  WBC 10.6*  NEUTROABS 8.0*  HGB 11.9*  HCT 37.9*  MCV 92.2  PLT 268   Basic Metabolic Panel: Recent Labs  Lab 10/08/20 0833  NA 138  K 5.1  CL 109  CO2 16*  GLUCOSE 101*  BUN 35*  CREATININE 2.29*  CALCIUM 8.6*   GFR: Estimated Creatinine Clearance: 41.5 mL/min (A) (by C-G formula based on SCr of 2.29 mg/dL (H)). Liver Function Tests: Recent Labs  Lab 10/08/20 0833  AST 26  ALT 24  ALKPHOS 78  BILITOT 0.9  PROT 7.1  ALBUMIN 3.2*   No results for input(s): LIPASE, AMYLASE in the last 168 hours. No results for input(s): AMMONIA in the last 168  hours. Coagulation Profile: Recent Labs  Lab 10/04/20 1419 10/08/20 0833  INR 1.2* 1.5*   Cardiac Enzymes: No results for input(s): CKTOTAL, CKMB, CKMBINDEX, TROPONINI in the last 168 hours. BNP (last 3 results) No results for input(s): PROBNP in the last 8760 hours. HbA1C: No results for input(s): HGBA1C in the last 72 hours. CBG: No results for input(s): GLUCAP in the last 168 hours. Lipid Profile: No results for input(s): CHOL, HDL, LDLCALC, TRIG, CHOLHDL, LDLDIRECT in the last 72 hours. Thyroid Function Tests: No results for input(s): TSH, T4TOTAL, FREET4, T3FREE, THYROIDAB in the last 72 hours. Anemia Panel: No results for input(s): VITAMINB12, FOLATE, FERRITIN, TIBC, IRON, RETICCTPCT in the last 72 hours. Urine analysis:    Component Value Date/Time   COLORURINE YELLOW 10/08/2020 0828   APPEARANCEUR CLOUDY (A) 10/08/2020 0828   LABSPEC 1.013 10/08/2020 0828   PHURINE 5.0 10/08/2020 0828   GLUCOSEU NEGATIVE 10/08/2020 0828   GLUCOSEU 100 (A) 06/16/2020 1159   HGBUR LARGE (A) 10/08/2020 0828   BILIRUBINUR NEGATIVE 10/08/2020 0828   KETONESUR NEGATIVE 10/08/2020 0828   PROTEINUR 30 (A) 10/08/2020 0828   UROBILINOGEN 0.2 06/16/2020 1159   NITRITE POSITIVE (A) 10/08/2020 0828   LEUKOCYTESUR  LARGE (A) 10/08/2020 0828    Radiological Exams on Admission: CT Renal Stone Study  Result Date: 10/08/2020 CLINICAL DATA:  Left-sided flank pain EXAM: CT ABDOMEN AND PELVIS WITHOUT CONTRAST TECHNIQUE: Multidetector CT imaging of the abdomen and pelvis was performed following the standard protocol without IV contrast. COMPARISON:  06/06/2020 FINDINGS: Lower chest: No acute abnormality. Hepatobiliary: No focal liver abnormality is seen. No gallstones, gallbladder wall thickening, or biliary dilatation. Pancreas: Unremarkable. No pancreatic ductal dilatation or surrounding inflammatory changes. Spleen: Normal in size without focal abnormality. Adrenals/Urinary Tract: Adrenal glands are within normal limits. Right kidney is unremarkable. Left kidney demonstrates nonobstructing renal calculi measuring approximately 14mm. Additionally mild left-sided hydronephrosis is noted secondary to a small 2-3 mm left mid ureteral stone. The bladder is within normal limits. Left renal cyst is again noted. Stomach/Bowel: Appendix is within normal limits. No obstructive or inflammatory changes of large or small bowel are seen. No gastric abnormality is noted. Vascular/Lymphatic: Aortic atherosclerosis. No enlarged abdominal or pelvic lymph nodes. Reproductive: Prostate is unremarkable. Other: No abdominal wall hernia or abnormality. No abdominopelvic ascites. Musculoskeletal: No acute or significant osseous findings. IMPRESSION: 2-3 mm partially obstructing left mid ureteral stone. Nonobstructing renal stones are noted on the left the largest of which measures approximately 5 mm. No other focal abnormality is noted. Electronically Signed   By: Inez Catalina M.D.   On: 10/08/2020 09:34    EKG: None  Assessment/Plan Active Problems:   UTI (urinary tract infection)  (please populate well all problems here in Problem List. (For example, if patient is on BP meds at home and you resume or decide to hold them, it is a problem  that needs to be her. Same for CAD, COPD, HLD and so on)  Complicated UTI -Ceftriaxone now -Most recent urine culture result reviewed.  Left obstructive uropathy -Urology informed, plan to take patient to or for left ureteral stenting and cystoscopy this afternoon. -Hold today's Coumadin -IV pain meds  Uncontrolled hypertension -Likely from flank pain. -Continue Amlodipine, Hold ARB given the risk of uncertainty of kidney function trending  Dehydration -Start IV fluid  CKD stage II with metabolic acidosis -Signs of volume depleted, start IV fluid -Reevaluate after hydration and ureter stenting  Recent PE with subtherapeutic INR -Hold today's Coumadin for  incoming urology procedure -Restart Coumadin tomorrow   DVT prophylaxis: INR=1.5, hold today's Coumadin Code Status: Full code Family Communication: None at bedside Disposition Plan: Expect patient can be discharged tomorrow once urology procedure completed, if symptoms stable can de-escalate to the p.o. antibiotics and discharge while following with PCP with urine culture result. Consults called: Urology Admission status: MedSurg observation   Lequita Halt MD [facility] Pager [phone]  10/08/2020, 1:03 PM

## 2020-10-08 NOTE — Plan of Care (Signed)
  Problem: Education: Goal: Knowledge of General Education information will improve Description Including pain rating scale, medication(s)/side effects and non-pharmacologic comfort measures Outcome: Progressing   Problem: Health Behavior/Discharge Planning: Goal: Ability to manage health-related needs will improve Outcome: Progressing   

## 2020-10-08 NOTE — Discharge Instructions (Signed)

## 2020-10-08 NOTE — Op Note (Addendum)
PATIENT:  [name]  Preoperative diagnosis:  1. Left obstructing ureteral stone   Postoperative diagnosis:  1. Left obstructing ureteral stone   Procedure:  1. Cystoscopy 2. Left ureteral stent placement  3. Left retrograde pyelography with interpretation   Surgeon: Irine Seal, M.D. Assistant: Ermelinda Das MD  Anesthesia: General  Complications: None  EBL: Minimal  Specimens: None  Indication: [name] is a 64 y.o. male with left obstructing ureteral stone and UTI. After reviewing the management options for treatment, they have elected to proceed with the above surgical procedure(s). We have discussed the potential benefits and risks of the procedure, side effects of the proposed treatment, the likelihood of the patient achieving the goals of the procedure, and any potential problems that might occur during the procedure or recuperation. Informed consent has been obtained.  Findings: Normal anterior urethra, slight narrowing of urethra just distal to external urethral sphincter. Normal without lesions mass or stones. Urine in bladder cloudy and with large amount of debris. Diffuse inflammation throughout bladder. Bilateral UOs identified. Left RPG with moderate hydro, possible filling defects in distal ureter. Purulent efflux noted following successful placement of left ureteral stent. Foley catheter was placed due to purulent efflux and debris in bladder to ensure drainage.   Description of procedure:   The patient was taken to the operating room and general anesthesia was induced.  The patient was placed in the dorsal lithotomy position, prepped and draped in the usual sterile fashion, and preoperative antibiotics were administered. A preoperative time-out was performed.   Cystourethroscopy was performed.  The patient's urethra was examined and was normal. There was a large amount of debris.The bladder was then systematically examined in its entirety. There was no evidence  for any bladder tumors, stones, or other mucosal pathology.    Attention then turned to the left ureteral orifice and a ureteral catheter was used to intubate the ureteral orifice.  Omnipaque contrast was injected through the ureteral catheter and a retrograde pyelogram which revealed the above findings.  A Sensor guidewire was then advanced up the left ureter into the renal pelvis under fluoroscopic guidance.  The wire was then backloaded through the cystoscope and a ureteral stent was advance over the wire using Seldinger technique.  The stent was positioned appropriately under fluoroscopic and cystoscopic guidance.  The wire was then removed with an adequate stent curl noted in the renal pelvis as well as in the bladder.  The bladder was then emptied and the procedure ended. A foley catheter was placed without difficulty with 10cc in balloon. The patient appeared to tolerate the procedure well and without complications.  The patient was able to be awakened and transferred to the recovery unit in satisfactory condition.

## 2020-10-08 NOTE — ED Notes (Signed)
Patient transported to CT 

## 2020-10-08 NOTE — Anesthesia Preprocedure Evaluation (Signed)
Anesthesia Evaluation  Patient identified by MRN, date of birth, ID band Patient awake    Reviewed: Allergy & Precautions, NPO status , Patient's Chart, lab work & pertinent test results  Airway Mallampati: II  TM Distance: >3 FB     Dental   Pulmonary    breath sounds clear to auscultation       Cardiovascular hypertension,  Rhythm:Regular Rate:Normal     Neuro/Psych  Neuromuscular disease    GI/Hepatic negative GI ROS, Neg liver ROS,   Endo/Other  diabetes  Renal/GU Renal disease     Musculoskeletal  (+) Arthritis ,   Abdominal   Peds  Hematology   Anesthesia Other Findings   Reproductive/Obstetrics                             Anesthesia Physical Anesthesia Plan  ASA: III  Anesthesia Plan: General   Post-op Pain Management:    Induction: Intravenous  PONV Risk Score and Plan: 2 and Ondansetron, Dexamethasone and Midazolam  Airway Management Planned: LMA  Additional Equipment:   Intra-op Plan:   Post-operative Plan: Extubation in OR  Informed Consent:   Plan Discussed with: CRNA and Anesthesiologist  Anesthesia Plan Comments:         Anesthesia Quick Evaluation

## 2020-10-08 NOTE — Transfer of Care (Signed)
Immediate Anesthesia Transfer of Care Note  Patient: [name]  Procedure(s) Performed: CYSTOSCOPY WITH RETROGRADE PYELOGRAM/URETERAL STENT PLACEMENT (Left )  Patient Location: PACU  Anesthesia Type:General  Level of Consciousness: drowsy  Airway & Oxygen Therapy: Patient Spontanous Breathing and Patient connected to face mask oxygen  Post-op Assessment: Report given to RN and Post -op Vital signs reviewed and stable  Post vital signs: Reviewed and stable  Last Vitals:  Vitals Value Taken Time  BP 111/61 10/08/20 1631  Temp    Pulse 112 10/08/20 1636  Resp 17 10/08/20 1636  SpO2 98 % 10/08/20 1636  Vitals shown include unvalidated device data.  Last Pain:  Vitals:   10/08/20 1630  TempSrc:   PainSc: Asleep         Complications: No complications documented.

## 2020-10-08 NOTE — ED Provider Notes (Signed)
[facility] EMERGENCY DEPARTMENT Provider Note   CSN: [id number] Arrival date & time: 10/08/20  0800     History Chief Complaint  Patient presents with  . Flank Pain    [name] is a 64 y.o. male with PMH of IDDM, HTN, HLD, stage III CKD, OSA, kidney stones, and PE on warfarin who presents the ED with a 4-hour history of left-sided flank pain.  Patient tells me that he intermittently has mild left-sided low back pain due to significant left knee osteoarthritis, however he developed severe 8 out of 10 left flank pain around 4 AM this morning.  He also states that he he has had mild dysuria and inconsistent urinary streams recently.  While he has a history of PE, he states that he has been taking his warfarin as directed and getting his INR checked regularly.  He denies any pleuritic symptoms or shortness of breath.  No chest pain.  He also denies any abdominal pain, nausea or vomiting, unilateral extremity swelling or edema, fevers or chills, or other symptoms.  His urologist is Dr. Link Snuffer.  HPI     Past Medical History:  Diagnosis Date  . Arthritis   . Diabetes mellitus without complication ([place])   . History of kidney stones   . History of pulmonary embolus (PE)   . Hypertension   . Lipidemia   . Prostate enlargement     Patient Active Problem List   Diagnosis Date Noted  . Gross hematuria 06/16/2020  . Renal lithiasis 06/07/2020  . Renal stone 06/06/2020  . 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 Visual disturbance 03/30/2020  . Chronic pain of left knee 03/30/2020  . Snores 03/30/2020  . Chronic kidney disease, stage 3b ([place]) 03/30/2020  . Midline low back pain without sciatica 03/30/2020  . Hospital discharge follow-up 02/25/2020  . Type 2 diabetes mellitus with diabetic polyneuropathy, with long-term current use of insulin ([place]) 02/23/2020  . Syncope 02/08/2020   . Renal stones 01/30/2020  . Diminished pulses in lower extremity 01/26/2020  . AKI (acute kidney injury) ([place]) 01/26/2020  . CKD (chronic kidney disease) stage 4, GFR 15-29 ml/min ([facility]) 01/05/2020  . Hyperkalemia 01/05/2020  . Long term (current) use of anticoagulants 12/22/2019  . Elevated cholesterol 12/17/2019  . Primary osteoarthritis of left knee 12/17/2019  . Arthritis 12/17/2019  . Type 2 diabetes mellitus with hyperglycemia, with long-term current use of insulin ([place]) 12/17/2019  . History of pulmonary embolism 12/17/2019  . Essential hypertension 12/17/2019    Past Surgical History:  Procedure Laterality Date  . CYSTOSCOPY/URETEROSCOPY/HOLMIUM LASER/STENT PLACEMENT Right 06/07/2020   Procedure: CYSTOSCOPY/URETEROSCOPY/HOLMIUM LASER/STENT PLACEMENT;  Surgeon: Lucas Mallow, MD;  Location: [facility];  Service: Urology;  Laterality: Right;       Family History  Problem Relation Age of Onset  . Cancer Mother   . Heart disease Father     Social History   Tobacco Use  . Smoking status: Never Smoker  . Smokeless tobacco: Never Used  Vaping Use  . Vaping Use: Never used  Substance Use Topics  . Alcohol use: Yes    Alcohol/week: 1.0 standard drink    Types: 1 Cans of beer per week    Comment: social  . Drug use: Never    Home Medications Prior to Admission medications   Medication Sig Start Date End Date Taking? Authorizing Provider  Accu-Chek Softclix Lancets lancets  Use as instructed to check blood sugar 3 times daily Dx is E11.65 09/18/20   Shamleffer, Melanie Crazier, MD  acetaminophen (TYLENOL 8 HOUR ARTHRITIS PAIN) 650 MG CR tablet Take 650 mg by mouth every 8 (eight) hours as needed for pain.    [provider]  amLODipine (NORVASC) 10 MG tablet Take 1 tablet (10 mg total) by mouth daily. 02/18/20 06/06/21  Elouise Munroe, MD  aspirin EC 81 MG tablet Take 81 mg by mouth daily.    [provider]  Blood Glucose Monitoring Suppl (ACCU-CHEK  AVIVA) device by Other route. Use as instructed    [provider]  cilostazol (PLETAL) 100 MG tablet TAKE 1 TABLET BY MOUTH TWICE A DAY 07/01/20   [name], MD  gabapentin (NEURONTIN) 300 MG capsule Take 300 mg by mouth 3 (three) times daily as needed (pain).     [provider]  glucose blood (ACCU-CHEK GUIDE) test strip Use to check blood sugar 3 times daily.E11.65 05/22/20   Shamleffer, Melanie Crazier, MD  insulin NPH-regular Human (HUMULIN 70/30) (70-30) 100 UNIT/ML injection Inject 50 Units into the skin daily with breakfast AND 52 Units daily with supper. INJECT 52 UNITS INTO THE SKIN 2 (TWO) TIMES DAILY WITH A MEAL.. 09/22/20   Shamleffer, Melanie Crazier, MD  Insulin Syringes, Disposable, U-100 1 ML MISC 1 Device by Does not apply route as directed. 02/23/20   Shamleffer, Melanie Crazier, MD  losartan (COZAAR) 50 MG tablet Take 1 tablet (50 mg total) by mouth daily. 08/28/20   [name], MD  lovastatin (MEVACOR) 40 MG tablet Take 1 tablet (40 mg total) by mouth at bedtime. 04/07/20   [name], MD  methocarbamol (ROBAXIN) 500 MG tablet Take 1 tablet (500 mg total) by mouth every 8 (eight) hours as needed for muscle spasms. 03/30/20   [name], MD  montelukast (SINGULAIR) 10 MG tablet TAKE 1 TABLET BY MOUTH EVERY DAY Patient taking differently: Take 10 mg by mouth at bedtime.  05/19/20   [name], MD  warfarin (COUMADIN) 5 MG tablet TAKE 1 TABLET (5 MG TOTAL) BY MOUTH DAILY. TAKE AS DIRECTED. Patient taking differently: Take 5 mg by mouth daily.  03/21/20   [name], MD    Allergies    Lisinopril  Review of Systems   Review of Systems  All other systems reviewed and are negative.   Physical Exam Updated Vital Signs BP (!) 159/88   Pulse (!) 109   Temp 98.7 F (37.1 C) (Oral)   Resp 20   Ht 6\' 1"  (1.854 m)   Wt 111.1 kg   SpO2 99%   BMI 32.32 kg/m   Physical Exam Vitals and nursing  note reviewed. Exam conducted with a chaperone present.  HENT:     Head: Normocephalic and atraumatic.  Eyes:     General: No scleral icterus.    Conjunctiva/sclera: Conjunctivae normal.  Cardiovascular:     Rate and Rhythm: Normal rate and regular rhythm.     Pulses: Normal pulses.  Pulmonary:     Effort: Pulmonary effort is normal. No respiratory distress.     Breath sounds: Normal breath sounds. No wheezing or rales.  Abdominal:     General: Abdomen is flat. There is no distension.     Palpations: Abdomen is soft.     Tenderness: There is no abdominal tenderness. There is no right CVA tenderness, left CVA tenderness or guarding.     Comments:  No significant abdominal or flank tenderness to palpation.  No distention.  Bruising over right side of abdomen due to his insulin injections.  No associated tenderness.  Musculoskeletal:     Right lower leg: Edema present.     Left lower leg: Edema present.     Comments: Bilateral lower extremity pitting edema and overlying venous stasis dermatitis.  Skin:    General: Skin is dry.     Capillary Refill: Capillary refill takes less than 2 seconds.  Neurological:     Mental Status: He is alert and oriented to person, place, and time.     GCS: GCS eye subscore is 4. GCS verbal subscore is 5. GCS motor subscore is 6.  Psychiatric:        Mood and Affect: Mood normal.        Behavior: Behavior normal.        Thought Content: Thought content normal.     ED Results / Procedures / Treatments   Labs (all labs ordered are listed, but only abnormal results are displayed) Labs Reviewed  URINALYSIS, ROUTINE W REFLEX MICROSCOPIC - Abnormal; Notable for the following components:      Result Value   APPearance CLOUDY (*)    Hgb urine dipstick LARGE (*)    Protein, ur 30 (*)    Nitrite POSITIVE (*)    Leukocytes,Ua LARGE (*)    RBC / HPF >50 (*)    WBC, UA >50 (*)    Bacteria, UA FEW (*)    All other components within normal limits   PROTIME-INR - Abnormal; Notable for the following components:   Prothrombin Time 17.4 (*)    INR 1.5 (*)    All other components within normal limits  CBC WITH DIFFERENTIAL/PLATELET - Abnormal; Notable for the following components:   WBC 10.6 (*)    RBC 4.11 (*)    Hemoglobin 11.9 (*)    HCT 37.9 (*)    Neutro Abs 8.0 (*)    Monocytes Absolute 1.1 (*)    All other components within normal limits  COMPREHENSIVE METABOLIC PANEL - Abnormal; Notable for the following components:   CO2 16 (*)    Glucose, Bld 101 (*)    BUN 35 (*)    Creatinine, Ser 2.29 (*)    Calcium 8.6 (*)    Albumin 3.2 (*)    GFR, Estimated 31 (*)    All other components within normal limits  RESP PANEL BY RT-PCR (FLU A&B, COVID) [id number]  URINE CULTURE    EKG None  Radiology CT Renal Stone Study  Result Date: 10/08/2020 CLINICAL DATA:  Left-sided flank pain EXAM: CT ABDOMEN AND PELVIS WITHOUT CONTRAST TECHNIQUE: Multidetector CT imaging of the abdomen and pelvis was performed following the standard protocol without IV contrast. COMPARISON:  06/06/2020 FINDINGS: Lower chest: No acute abnormality. Hepatobiliary: No focal liver abnormality is seen. No gallstones, gallbladder wall thickening, or biliary dilatation. Pancreas: Unremarkable. No pancreatic ductal dilatation or surrounding inflammatory changes. Spleen: Normal in size without focal abnormality. Adrenals/Urinary Tract: Adrenal glands are within normal limits. Right kidney is unremarkable. Left kidney demonstrates nonobstructing renal calculi measuring approximately 23mm. Additionally mild left-sided hydronephrosis is noted secondary to a small 2-3 mm left mid ureteral stone. The bladder is within normal limits. Left renal cyst is again noted. Stomach/Bowel: Appendix is within normal limits. No obstructive or inflammatory changes of large or small bowel are seen. No gastric abnormality is noted. Vascular/Lymphatic: Aortic atherosclerosis. No enlarged abdominal or  pelvic lymph  nodes. Reproductive: Prostate is unremarkable. Other: No abdominal wall hernia or abnormality. No abdominopelvic ascites. Musculoskeletal: No acute or significant osseous findings. IMPRESSION: 2-3 mm partially obstructing left mid ureteral stone. Nonobstructing renal stones are noted on the left the largest of which measures approximately 5 mm. No other focal abnormality is noted. Electronically Signed   By: Inez Catalina M.D.   On: 10/08/2020 09:34    Procedures Procedures (including critical care time)  Medications Ordered in ED Medications  cefTRIAXone (ROCEPHIN) 1 g in sodium chloride 0.9 % 100 mL IVPB (has no administration in time range)  morphine 4 MG/ML injection 4 mg (4 mg Intravenous Given 10/08/20 0903)  tamsulosin (FLOMAX) capsule 0.4 mg (0.4 mg Oral Given 10/08/20 1043)  sodium chloride 0.9 % bolus 500 mL (0 mLs Intravenous Stopped 10/08/20 1204)  HYDROmorphone (DILAUDID) injection 1 mg (1 mg Intravenous Given 10/08/20 1045)    ED Course  I have reviewed the triage vital signs and the nursing notes.  Pertinent labs & imaging results that were available during my care of the patient were reviewed by me and considered in my medical decision making (see chart for details).  Clinical Course as of Oct 08 1240  Nancy Fetter Oct 08, 2020  1030 Spoke with Dr. Ermelinda Das, urology, who states that patient will need to be admitted given urinary tract infection, +/- ureteral stent placement. Recommending fluids, pain control, and tamsulosin in interim.   [GG]  [id number] Spoke with Dr. Roosevelt Locks who will see and admit patient.     [GG]    Clinical Course User Index [GG] Corena Herter, PA-C   MDM Rules/Calculators/A&P                          Patient reports he has a history of kidney stones and this feels comparable.  He is also endorsing urinary symptoms.  We will obtain laboratory work-up and CT renal stone study.  He is followed by Dr. Gloriann Loan, urology.  Labs CBC with differential:  Leukocytosis to 10.6. Mild anemia with hemoglobin reduced to 11.9. CMP: Pro time INR: Subtherapeutic INR of 1.5. UA: Cloudy, positive nitrite, greater than 50 RBCs and greater than 50 WBCs.  Imaging I personally reviewed CT renal stone study which reveals a 2 to 3 mm partially obstructing left mid ureteral stone.    Patient is history, physical exam, and work-up is consistent with a partially obstructing ureteral stone in the context of UTI. Patient is nitrite positive and complaining of dysuria.  Spoke with Dr. Ermelinda Das, urology, who states that patient will need to be admitted given urinary tract infection, +/- ureteral stent placement. Recommending fluids, pain control, and tamsulosin in interim.  He has not had anything to eat or drink since last evening.  Will provide patient with an additional dose of Dilaudid pain medication given that his symptoms were unimproved with 4 mg morphine.  PCP is with [facility].    Spoke with Dr. Al Pimple.  She will plan for ureteral stent this afternoon, requests admission to hospitalist for continued IV antibiotics and pain control.  Spoke with Dr. Roosevelt Locks who will see and admit patient.    Final Clinical Impression(s) / ED Diagnoses Final diagnoses:  Acute UTI  Ureterolithiasis    Rx / DC Orders ED Discharge Orders    None       Corena Herter, PA-C 10/08/20 1241    Blanchie Dessert, MD 10/14/20 1201

## 2020-10-08 NOTE — Progress Notes (Signed)
Urology communicated. Severe inflammation in bladder and Pyelonephritis was noticed during cystoscope and Foley was placed in, in addition to left ureter stenting. Foley may be discontinue in AM if pt stable as per urology.

## 2020-10-09 ENCOUNTER — Telehealth: Payer: Self-pay

## 2020-10-09 ENCOUNTER — Encounter (HOSPITAL_COMMUNITY): Payer: Self-pay | Admitting: Urology

## 2020-10-09 ENCOUNTER — Other Ambulatory Visit: Payer: Self-pay | Admitting: Urology

## 2020-10-09 DIAGNOSIS — N201 Calculus of ureter: Secondary | ICD-10-CM | POA: Diagnosis present

## 2020-10-09 DIAGNOSIS — Z794 Long term (current) use of insulin: Secondary | ICD-10-CM | POA: Diagnosis not present

## 2020-10-09 DIAGNOSIS — I129 Hypertensive chronic kidney disease with stage 1 through stage 4 chronic kidney disease, or unspecified chronic kidney disease: Secondary | ICD-10-CM | POA: Diagnosis present

## 2020-10-09 DIAGNOSIS — Z20822 Contact with and (suspected) exposure to covid-19: Secondary | ICD-10-CM | POA: Diagnosis present

## 2020-10-09 DIAGNOSIS — Z86711 Personal history of pulmonary embolism: Secondary | ICD-10-CM | POA: Diagnosis not present

## 2020-10-09 DIAGNOSIS — E1122 Type 2 diabetes mellitus with diabetic chronic kidney disease: Secondary | ICD-10-CM | POA: Diagnosis present

## 2020-10-09 DIAGNOSIS — N1832 Chronic kidney disease, stage 3b: Secondary | ICD-10-CM | POA: Diagnosis present

## 2020-10-09 DIAGNOSIS — M1712 Unilateral primary osteoarthritis, left knee: Secondary | ICD-10-CM | POA: Diagnosis present

## 2020-10-09 DIAGNOSIS — N179 Acute kidney failure, unspecified: Secondary | ICD-10-CM | POA: Diagnosis present

## 2020-10-09 DIAGNOSIS — E860 Dehydration: Secondary | ICD-10-CM | POA: Diagnosis present

## 2020-10-09 DIAGNOSIS — N136 Pyonephrosis: Secondary | ICD-10-CM | POA: Diagnosis present

## 2020-10-09 DIAGNOSIS — Z79899 Other long term (current) drug therapy: Secondary | ICD-10-CM | POA: Diagnosis not present

## 2020-10-09 DIAGNOSIS — G4733 Obstructive sleep apnea (adult) (pediatric): Secondary | ICD-10-CM | POA: Diagnosis present

## 2020-10-09 DIAGNOSIS — E869 Volume depletion, unspecified: Secondary | ICD-10-CM | POA: Diagnosis present

## 2020-10-09 DIAGNOSIS — E872 Acidosis: Secondary | ICD-10-CM | POA: Diagnosis present

## 2020-10-09 DIAGNOSIS — Z7982 Long term (current) use of aspirin: Secondary | ICD-10-CM | POA: Diagnosis not present

## 2020-10-09 DIAGNOSIS — Z7901 Long term (current) use of anticoagulants: Secondary | ICD-10-CM | POA: Diagnosis not present

## 2020-10-09 DIAGNOSIS — N390 Urinary tract infection, site not specified: Secondary | ICD-10-CM | POA: Diagnosis present

## 2020-10-09 DIAGNOSIS — Z888 Allergy status to other drugs, medicaments and biological substances status: Secondary | ICD-10-CM | POA: Diagnosis not present

## 2020-10-09 DIAGNOSIS — Z87442 Personal history of urinary calculi: Secondary | ICD-10-CM | POA: Diagnosis not present

## 2020-10-09 LAB — CBC
HCT: 34.3 % — ABNORMAL LOW (ref 39.0–52.0)
Hemoglobin: 11.4 g/dL — ABNORMAL LOW (ref 13.0–17.0)
MCH: 30 pg (ref 26.0–34.0)
MCHC: 33.2 g/dL (ref 30.0–36.0)
MCV: 90.3 fL (ref 80.0–100.0)
Platelets: 184 10*3/uL (ref 150–400)
RBC: 3.8 MIL/uL — ABNORMAL LOW (ref 4.22–5.81)
RDW: 14 % (ref 11.5–15.5)
WBC: 9.7 10*3/uL (ref 4.0–10.5)
nRBC: 0 % (ref 0.0–0.2)

## 2020-10-09 LAB — BASIC METABOLIC PANEL
Anion gap: 12 (ref 5–15)
BUN: 42 mg/dL — ABNORMAL HIGH (ref 8–23)
CO2: 16 mmol/L — ABNORMAL LOW (ref 22–32)
Calcium: 8 mg/dL — ABNORMAL LOW (ref 8.9–10.3)
Chloride: 110 mmol/L (ref 98–111)
Creatinine, Ser: 2.76 mg/dL — ABNORMAL HIGH (ref 0.61–1.24)
GFR, Estimated: 25 mL/min — ABNORMAL LOW (ref >60)
Glucose, Bld: 286 mg/dL — ABNORMAL HIGH (ref 70–99)
Potassium: 5.1 mmol/L (ref 3.5–5.1)
Sodium: 138 mmol/L (ref 135–145)

## 2020-10-09 LAB — GLUCOSE, CAPILLARY
Glucose-Capillary: 246 mg/dL — ABNORMAL HIGH (ref 70–99)
Glucose-Capillary: 340 mg/dL — ABNORMAL HIGH (ref 70–99)
Glucose-Capillary: 222 mg/dL — ABNORMAL HIGH (ref 70–99)
Glucose-Capillary: 133 mg/dL — ABNORMAL HIGH (ref 70–99)

## 2020-10-09 LAB — PROTIME-INR
INR: 1.7 — ABNORMAL HIGH (ref 0.8–1.2)
Prothrombin Time: 19.5 seconds — ABNORMAL HIGH (ref 11.4–15.2)

## 2020-10-09 MED ORDER — INSULIN ASPART PROT & ASPART (70-30 MIX) 100 UNIT/ML ~~LOC~~ SUSP
20.0000 [IU] | Freq: Two times a day (BID) | SUBCUTANEOUS | Status: DC
  Administered 2020-10-09 – 2020-10-11 (×4): 20 [IU] via SUBCUTANEOUS
  Filled 2020-10-09 (×2): qty 10

## 2020-10-09 MED ORDER — SODIUM CHLORIDE 0.9 % IV SOLN: INTRAVENOUS | Status: AC

## 2020-10-09 MED ORDER — WARFARIN SODIUM 7.5 MG PO TABS
7.5000 mg | ORAL_TABLET | Freq: Once | ORAL | Status: AC
  Administered 2020-10-09: 7.5 mg via ORAL
  Filled 2020-10-09: qty 1

## 2020-10-09 MED ORDER — WARFARIN - PHARMACIST DOSING INPATIENT: Freq: Every day | Status: DC

## 2020-10-09 MED ORDER — ENOXAPARIN SODIUM 60 MG/0.6ML ~~LOC~~ SOLN
60.0000 mg | SUBCUTANEOUS | Status: DC
  Administered 2020-10-09 – 2020-10-11 (×3): 60 mg via SUBCUTANEOUS
  Filled 2020-10-09 (×3): qty 0.6

## 2020-10-09 NOTE — Telephone Encounter (Signed)
Returned Pam's call to inform of message below no answer left detailed message on VM also asked to give [place] a call back.

## 2020-10-09 NOTE — H&P (View-Only) (Signed)
1 Day Post-Op  Subjective: [name] is doing well without complaints this morning.  His urine is clearing and he is afebrile but his Cr. Is up.  Urine culture pending.   He had a severe UTI with pyonephrosis noted at cystoscopy. ROS:  Review of Systems  All other systems reviewed and are negative.   Anti-infectives: Anti-infectives (From admission, onward)   Start     Dose/Rate Route Frequency Ordered Stop   10/09/20 1330  cefTRIAXone (ROCEPHIN) 1 g in sodium chloride 0.9 % 100 mL IVPB        1 g 200 mL/hr over 30 Minutes Intravenous Every 24 hours 10/08/20 1309     10/08/20 1215  cefTRIAXone (ROCEPHIN) 1 g in sodium chloride 0.9 % 100 mL IVPB  Status:  Discontinued        1 g 200 mL/hr over 30 Minutes Intravenous  Once 10/08/20 1210 10/08/20 1210   10/08/20 1215  cefTRIAXone (ROCEPHIN) 1 g in sodium chloride 0.9 % 100 mL IVPB        1 g 200 mL/hr over 30 Minutes Intravenous  Once 10/08/20 1211 10/08/20 1359      Current Facility-Administered Medications  Medication Dose Route Frequency Provider Last Rate Last Admin  . 0.9 %  sodium chloride infusion   Intravenous Continuous Irine Seal, MD 100 mL/hr at 10/09/20 0225 New Bag at 10/09/20 0225  . acetaminophen (TYLENOL) tablet 650 mg  650 mg Oral Q6H PRN Irine Seal, MD       Or  . acetaminophen (TYLENOL) suppository 650 mg  650 mg Rectal Q6H PRN Irine Seal, MD      . amLODipine (NORVASC) tablet 10 mg  10 mg Oral Daily Irine Seal, MD   10 mg at 10/08/20 1329  . aspirin EC tablet 81 mg  81 mg Oral Daily Irine Seal, MD   81 mg at 10/08/20 1329  . cefTRIAXone (ROCEPHIN) 1 g in sodium chloride 0.9 % 100 mL IVPB  1 g Intravenous Q24H Irine Seal, MD      . Chlorhexidine Gluconate Cloth 2 % PADS 6 each  6 each Topical Daily Wynetta Fines T, MD      . cilostazol (PLETAL) tablet 100 mg  100 mg Oral BID Irine Seal, MD   100 mg at 10/08/20 2149  . gabapentin (NEURONTIN) capsule 300 mg  300 mg Oral TID PRN Irine Seal, MD      . hydrALAZINE  (APRESOLINE) tablet 25 mg  25 mg Oral Q6H PRN Irine Seal, MD      . HYDROmorphone (DILAUDID) injection 0.5-1 mg  0.5-1 mg Intravenous Q2H PRN Irine Seal, MD      . insulin aspart (novoLOG) injection 0-5 Units  0-5 Units Subcutaneous QHS Irine Seal, MD   2 Units at 10/08/20 2151  . insulin aspart (novoLOG) injection 0-9 Units  0-9 Units Subcutaneous TID WC Irine Seal, MD      . methocarbamol (ROBAXIN) tablet 500 mg  500 mg Oral Q8H PRN Irine Seal, MD      . montelukast (SINGULAIR) tablet 10 mg  10 mg Oral QHS Irine Seal, MD   10 mg at 10/08/20 2149  . ondansetron (ZOFRAN) tablet 4 mg  4 mg Oral Q6H PRN Irine Seal, MD       Or  . ondansetron [facility]) injection 4 mg  4 mg Intravenous Q6H PRN Irine Seal, MD      . pravastatin (PRAVACHOL) tablet 40 mg  40 mg Oral q1800 Irine Seal, MD  40 mg at 10/08/20 1844  . tamsulosin (FLOMAX) capsule 0.4 mg  0.4 mg Oral Daily Irine Seal, MD         Objective: Vital signs in last 24 hours: Temp:  [98.7 F (37.1 C)-99.8 F (37.7 C)] 98.8 F (37.1 C) (12/06 0430) Pulse Rate:  [85-124] 95 (12/06 0430) Resp:  [13-26] 18 (12/06 0430) BP: (111-159)/(61-92) 119/74 (12/06 0430) SpO2:  [91 %-100 %] 92 % (12/06 0430) Weight:  [111.1 kg-113.6 kg] 113.6 kg (12/05 1733)  Intake/Output from previous day: 12/05 0701 - 12/06 0700 In: 3347.5 [P.O.:460; I.V.:2137.5; IV Piggyback:750] Out: 1200 [Urine:1200] Intake/Output this shift: No intake/output data recorded.   Physical Exam Vitals reviewed.  Constitutional:      Appearance: Normal appearance.  Neurological:     Mental Status: He is alert.     Lab Results:  Recent Labs    10/08/20 0833 10/09/20 0241  WBC 10.6* 9.7  HGB 11.9* 11.4*  HCT 37.9* 34.3*  PLT 232 184   BMET Recent Labs    10/08/20 0833 10/09/20 0241  NA 138 138  K 5.1 5.1  CL 109 110  CO2 16* 16*  GLUCOSE 101* 286*  BUN 35* 42*  CREATININE 2.29* 2.76*  CALCIUM 8.6* 8.0*   PT/INR Recent Labs    10/08/20 0833  10/09/20 0241  LABPROT 17.4* 19.5*  INR 1.5* 1.7*   ABG No results for input(s): PHART, HCO3 in the last 72 hours.  Invalid input(s): PCO2, PO2  Studies/Results: DG Retrograde Pyelogram  Result Date: 10/08/2020 CLINICAL DATA:  64 year old with left ureter stones. EXAM: RETROGRADE PYELOGRAM COMPARISON:  CT stone study 10/08/2020 FINDINGS: Retrograde pyelogram demonstrates small filling defects in the distal left ureter and findings are suggestive for small stones. Evidence for left ureter stent placement. IMPRESSION: 1. Filling defects in the distal left ureter. Findings are suggestive for stones. 2. Placement of left ureter stent. Electronically Signed   By: Markus Daft M.D.   On: 10/08/2020 17:05   CT Renal Stone Study  Result Date: 10/08/2020 CLINICAL DATA:  Left-sided flank pain EXAM: CT ABDOMEN AND PELVIS WITHOUT CONTRAST TECHNIQUE: Multidetector CT imaging of the abdomen and pelvis was performed following the standard protocol without IV contrast. COMPARISON:  06/06/2020 FINDINGS: Lower chest: No acute abnormality. Hepatobiliary: No focal liver abnormality is seen. No gallstones, gallbladder wall thickening, or biliary dilatation. Pancreas: Unremarkable. No pancreatic ductal dilatation or surrounding inflammatory changes. Spleen: Normal in size without focal abnormality. Adrenals/Urinary Tract: Adrenal glands are within normal limits. Right kidney is unremarkable. Left kidney demonstrates nonobstructing renal calculi measuring approximately 87mm. Additionally mild left-sided hydronephrosis is noted secondary to a small 2-3 mm left mid ureteral stone. The bladder is within normal limits. Left renal cyst is again noted. Stomach/Bowel: Appendix is within normal limits. No obstructive or inflammatory changes of large or small bowel are seen. No gastric abnormality is noted. Vascular/Lymphatic: Aortic atherosclerosis. No enlarged abdominal or pelvic lymph nodes. Reproductive: Prostate is unremarkable.  Other: No abdominal wall hernia or abnormality. No abdominopelvic ascites. Musculoskeletal: No acute or significant osseous findings. IMPRESSION: 2-3 mm partially obstructing left mid ureteral stone. Nonobstructing renal stones are noted on the left the largest of which measures approximately 5 mm. No other focal abnormality is noted. Electronically Signed   By: Inez Catalina M.D.   On: 10/08/2020 09:34     Assessment and Plan: Left ureteral stone with sepsis of urinary origin.  Continue IV antibiotics pending culture then he will need coverage until ureteroscopy in  7-14 days.     AKI. Slightly worse this morning but UOP is good.  I will order the foley out. Continue medical management.      LOS: 0 days    Irine Seal 10/09/2020 [phone] ID: [name], male   DOB: 02/26/1956, 64 y.o.   MRN: [id number]

## 2020-10-09 NOTE — Telephone Encounter (Signed)
Looks like he is admitted at this time. With his hx of PE, I recommend he is bridged with lovenox per hospital protocol. Thank you

## 2020-10-09 NOTE — Progress Notes (Signed)
ANTICOAGULATION CONSULT NOTE - Initial Consult  Pharmacy Consult for Coumadin Indication: hx DVT and PE  Allergies  Allergen Reactions  . Lisinopril Other (See Comments)    Patient states that this medication makes him feel "off". Tolerates losartan.    Patient Measurements: Height: 6\' 1"  (185.4 cm) Weight: 113.6 kg (250 lb 7.1 oz) IBW/kg (Calculated) : 79.9  Vital Signs: Temp: 98.3 F (36.8 C) (12/06 1023) Temp Source: Oral (12/06 1023) BP: 131/69 (12/06 1023) Pulse Rate: 98 (12/06 1023)  Labs: Recent Labs    10/08/20 0833 10/09/20 0241  HGB 11.9* 11.4*  HCT 37.9* 34.3*  PLT 232 184  LABPROT 17.4* 19.5*  INR 1.5* 1.7*  CREATININE 2.29* 2.76*    Estimated Creatinine Clearance: 34.8 mL/min (A) (by C-G formula based on SCr of 2.76 mg/dL (H)).   Medical History: Past Medical History:  Diagnosis Date  . Arthritis   . Diabetes mellitus without complication ([place])   . History of kidney stones   . History of pulmonary embolus (PE)   . Hypertension   . Lipidemia   . Prostate enlargement     Medications:  Medications Prior to Admission  Medication Sig Dispense Refill Last Dose  . Accu-Chek Softclix Lancets lancets Use as instructed to check blood sugar 3 times daily Dx is E11.65 100 each 2 unknown at unknown  . acetaminophen (TYLENOL 8 HOUR ARTHRITIS PAIN) 650 MG CR tablet Take 650 mg by mouth in the morning and at bedtime.    10/07/2020 at Unknown time  . amLODipine (NORVASC) 10 MG tablet Take 1 tablet (10 mg total) by mouth daily. 90 tablet 3 10/07/2020 at Unknown time  . aspirin EC 81 MG tablet Take 81 mg by mouth daily.   10/07/2020 at Unknown time  . Blood Glucose Monitoring Suppl (ACCU-CHEK AVIVA) device by Other route. Use as instructed   unknown at unknown  . cilostazol (PLETAL) 100 MG tablet TAKE 1 TABLET BY MOUTH TWICE A DAY (Patient taking differently: Take 100 mg by mouth 2 (two) times daily. ) 180 tablet 1 10/07/2020 at Unknown time  . gabapentin (NEURONTIN)  300 MG capsule Take 300 mg by mouth 3 (three) times daily as needed (pain).    10/08/2020 at Unknown time  . glucose blood (ACCU-CHEK GUIDE) test strip Use to check blood sugar 3 times daily.E11.65 100 each 12 unknown at unknown  . insulin NPH-regular Human (70-30) 100 UNIT/ML injection Inject 50 Units into the skin in the morning and at bedtime.   10/07/2020 at Unknown time  . Insulin Syringes, Disposable, U-100 1 ML MISC 1 Device by Does not apply route as directed. 100 each 11 unknown at unknown  . losartan (COZAAR) 50 MG tablet Take 1 tablet (50 mg total) by mouth daily. 90 tablet 3 10/07/2020 at Unknown time  . lovastatin (MEVACOR) 40 MG tablet Take 1 tablet (40 mg total) by mouth at bedtime. 90 tablet 2 10/07/2020 at Unknown time  . montelukast (SINGULAIR) 10 MG tablet TAKE 1 TABLET BY MOUTH EVERY DAY (Patient taking differently: Take 10 mg by mouth at bedtime. ) 90 tablet 1 10/07/2020 at Unknown time  . warfarin (COUMADIN) 5 MG tablet TAKE 1 TABLET (5 MG TOTAL) BY MOUTH DAILY. TAKE AS DIRECTED. (Patient taking differently: Take 5-7.5 mg by mouth daily. Take one 5 mg tablet by mouth daily except on Thursdays and Saturdays take7.5 mg tablet) 90 tablet 2 10/07/2020 at 0900  . insulin NPH-regular Human (HUMULIN 70/30) (70-30) 100 UNIT/ML injection Inject 50  Units into the skin daily with breakfast AND 52 Units daily with supper. INJECT 52 UNITS INTO THE SKIN 2 (TWO) TIMES DAILY WITH A MEAL.. (Patient not taking: Reported on 10/08/2020) 40 mL 11 Not Taking at Unknown time  . methocarbamol (ROBAXIN) 500 MG tablet Take 1 tablet (500 mg total) by mouth every 8 (eight) hours as needed for muscle spasms. (Patient not taking: Reported on 10/08/2020) 30 tablet 0 Not Taking at Unknown time    Assessment: 64 yo M admitted with new onset left-sided flank pain and urinary difficulty x 3 days.  CT abd showed ureter obstruction.  Pt was on Coumadin PTA which was help prior to urology procedure.  INR today subtherapeutic  at 1.7.  MD also requests starting Lovenox for VTE ppx until INR therapeutic.  PTA dose: Coumadin 5mg  daily except 7.5mg  on Mon/Thurs  Goal of Therapy:  INR 2-3 Monitor platelets by anticoagulation protocol: Yes   Plan:  Coumadin 7.5mg  PO x 1 tonight Daily INR Lovenox 60mg  SQ q24h  Tashana Haberl, Pharm.D., BCPS Clinical Pharmacist Clinical phone for 10/09/2020 from 8:30-4:00 is [phone].  **Pharmacist phone directory can be found on [email or web address] listed under [place].  10/09/2020 10:56 AM   Gaylene Moylan, [place] 10/09/2020,10:50 AM

## 2020-10-09 NOTE — Telephone Encounter (Signed)
Pam is calling in from [facility] asking if okay to hold warfarin (COUMADIN) 5 MG tablet for 3 days, as patient is scheduled for surgery on 12/16.

## 2020-10-09 NOTE — Progress Notes (Addendum)
Inpatient Diabetes Program Recommendations  AACE/ADA: New Consensus Statement on Inpatient Glycemic Control (2015)  Target Ranges:  Prepandial:   less than 140 mg/dL      Peak postprandial:   less than 180 mg/dL (1-2 hours)      Critically ill patients:  140 - 180 mg/dL   Lab Results  Component Value Date   GLUCAP 340 (H) 10/09/2020   HGBA1C 7.2 (A) 09/22/2020    Review of Glycemic Control Results for [name], [name] (MRN [id number]) as of 10/09/2020 13:43  Ref. Range 10/08/2020 21:18 10/09/2020 06:53 10/09/2020 11:00  Glucose-Capillary Latest Ref Range: 70 - 99 mg/dL 210 (H) 246 (H) 340 (H)   Diabetes history: DM2 Outpatient Diabetes medications: Humulin 70/30 insulin 50 units bid ac meals Current orders for Inpatient glycemic control: Novolog sensitive correction tid + hs 0-5 units  Inpatient Diabetes Program Recommendations:   -Add 50% home insulin 70/30 insulin 25 units bid Spoke with patient to verify amount and type of insulin and patient states he has been taking insulin as prescribed.  Secure chat sent to Dr. Tyrell Antonio.  Thank you, Nani Gasser. Shantana Christon, RN, MSN, CDE  Diabetes Coordinator Inpatient Glycemic Control Team Team Pager [phone] (8am-5pm) 10/09/2020 1:45 PM

## 2020-10-09 NOTE — Progress Notes (Addendum)
PROGRESS NOTE    [name]  OVF:[id number] DOB: 1955/12/30 DOA: 10/08/2020 PCP: [name] Maw, MD   Brief Narrative: 64 year old with past medical history significant for kidney stone, CKD stage, hypertension, insulin-dependent diabetes, PE on Coumadin, PVD presented with new onset left-sided flank pain and urinary difficulty for 3 days prior to admission.  Patient reported dysuria. Evaluation in the ED CT abdomen showed obstructing left mid ureter 2 to 3 mm stone, bicarb 16, creatinine 2.2, UA cloudy, compatible with UTI. Was evaluated by urology, underwent cystoscopy  left ureteral stent placement and left retrograde pyelogram.    Assessment & Plan:   Active Problems:   Acute UTI  1-Left obstructing ureteral stone; urinary tract infection Patient underwent cystoscopy and left ureteral stent placement and left retrograde pyelogram on 12/5 Continue with IV fluids and IV antibiotics. Urine culture growing gram-negative rods. Cruciate urology evaluation.  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 Presented with a creatinine 2.4, today increased to 2.7. Plan to continue with IV fluids for another 24 hours. Worsening Kidney function likely obstructive uropathy  3-Hypertension: Blood pressure today soft continue to hold ARB and amlodipine 4-Dehydration: Continue with IV fluids 5-History of PE; Resume Coumadin And Lovenox for prophylaxis 6-DM; resume lower home dose 70/30. SSI.    Estimated body mass index is 33.04 kg/m as calculated from the following:   Height as of this encounter: 6\' 1"  (1.854 m).   Weight as of this encounter: 113.6 kg.   DVT prophylaxis: Coumadin Code Status: Full Code Family Communication: Care discussed with patient Disposition Plan:  Status is: Observation  The patient will require care spanning > 2 midnights and should be moved to inpatient because: IV  treatments appropriate due to intensity of illness or inability to take PO  Dispo: The patient is from: Home              Anticipated d/c is to: Home              Anticipated d/c date is: 1 day              Patient currently is not medically stable to d/c.        Consultants:   Urology  Procedures:  Patient underwent cystoscopy and left ureteral stent placement and left retrograde pyelogram on 12/5  Antimicrobials:  Ceftriaxone  Subjective: He complains of his chronic back pain, he denies flank pain.  Objective: Vitals:   10/08/20 1733 10/08/20 1742 10/08/20 2117 10/09/20 0430  BP:  118/70 114/66 119/74  Pulse:  (!) 110 100 95  Resp:  17 18 18   Temp:  99.7 F (37.6 C) 98.7 F (37.1 C) 98.8 F (37.1 C)  TempSrc:  Oral Oral Oral  SpO2:  91% 94% 92%  Weight: 113.6 kg     Height: 6\' 1"  (1.854 m)       Intake/Output Summary (Last 24 hours) at 10/09/2020 0738 Last data filed at 10/09/2020 0600 Gross per 24 hour  Intake 3347.46 ml  Output 1200 ml  Net 2147.46 ml   Filed Weights   10/08/20 0809 10/08/20 1733  Weight: 111.1 kg 113.6 kg    Examination:  General exam: Appears calm and comfortable  Respiratory system: Clear to auscultation. Respiratory effort normal. Cardiovascular system: S1 & S2 heard, RRR. No JVD, murmurs, rubs, gallops or clicks. No pedal edema. Gastrointestinal system: Abdomen is nondistended, soft and nontender.  No organomegaly or masses felt. Normal bowel sounds heard. Central nervous system: Alert and oriented. No focal neurological deficits. Extremities: Symmetric 5 x 5 power.    Data Reviewed: I have personally reviewed following labs and imaging studies  CBC: Recent Labs  Lab 10/08/20 0833 10/09/20 0241  WBC 10.6* 9.7  NEUTROABS 8.0*  --   HGB 11.9* 11.4*  HCT 37.9* 34.3*  MCV 92.2 90.3  PLT 232 800   Basic Metabolic Panel: Recent Labs  Lab 10/08/20 0833 10/09/20 0241  NA 138 138  K 5.1 5.1  CL 109 110  CO2 16* 16*   GLUCOSE 101* 286*  BUN 35* 42*  CREATININE 2.29* 2.76*  CALCIUM 8.6* 8.0*   GFR: Estimated Creatinine Clearance: 34.8 mL/min (A) (by C-G formula based on SCr of 2.76 mg/dL (H)). Liver Function Tests: Recent Labs  Lab 10/08/20 0833  AST 26  ALT 24  ALKPHOS 78  BILITOT 0.9  PROT 7.1  ALBUMIN 3.2*   No results for input(s): LIPASE, AMYLASE in the last 168 hours. No results for input(s): AMMONIA in the last 168 hours. Coagulation Profile: Recent Labs  Lab 10/04/20 1419 10/08/20 0833 10/09/20 0241  INR 1.2* 1.5* 1.7*   Cardiac Enzymes: No results for input(s): CKTOTAL, CKMB, CKMBINDEX, TROPONINI in the last 168 hours. BNP (last 3 results) No results for input(s): PROBNP in the last 8760 hours. HbA1C: No results for input(s): HGBA1C in the last 72 hours. CBG: Recent Labs  Lab 10/08/20 1507 10/08/20 1715 10/08/20 1754 10/08/20 2118 10/09/20 0653  GLUCAP 101* 107* 110* 210* 246*   Lipid Profile: No results for input(s): CHOL, HDL, LDLCALC, TRIG, CHOLHDL, LDLDIRECT in the last 72 hours. Thyroid Function Tests: No results for input(s): TSH, T4TOTAL, FREET4, T3FREE, THYROIDAB in the last 72 hours. Anemia Panel: No results for input(s): VITAMINB12, FOLATE, FERRITIN, TIBC, IRON, RETICCTPCT in the last 72 hours. Sepsis Labs: No results for input(s): PROCALCITON, LATICACIDVEN in the last 168 hours.  Recent Results (from the past 240 hour(s))  Resp Panel by RT-PCR (Flu A&B, Covid) Nasopharyngeal Swab     Status: None   Collection Time: 10/08/20 10:33 AM   Specimen: Nasopharyngeal Swab; Nasopharyngeal(NP) swabs in vial transport medium  Result Value Ref Range Status   SARS Coronavirus 2 by RT PCR NEGATIVE NEGATIVE Final    Comment: (NOTE) SARS-CoV-2 target nucleic acids are NOT DETECTED.  The SARS-CoV-2 RNA is generally detectable in upper respiratory specimens during the acute phase of infection. The lowest concentration of SARS-CoV-2 viral copies this assay can  detect is 138 copies/mL. A negative result does not preclude SARS-Cov-2 infection and should not be used as the sole basis for treatment or other patient management decisions. A negative result may occur with  improper specimen collection/handling, submission of specimen other than nasopharyngeal swab, presence of viral mutation(s) within the areas targeted by this assay, and inadequate number of viral copies(<138 copies/mL). A negative result must be combined with clinical observations, patient history, and epidemiological information. The expected result is Negative.  Fact Sheet for Patients:  EntrepreneurPulse.com.au  Fact Sheet for Healthcare Providers:  IncredibleEmployment.be  This test is no t yet approved or cleared by the [place] FDA and  has been authorized for detection and/or diagnosis of SARS-CoV-2 by FDA under an Emergency Use Authorization (EUA). This EUA will remain  in effect (meaning this test can be used) for the duration of the COVID-19 declaration under Section 564(b)(1) of the Act, 21 U.S.C.section 360bbb-3(b)(1), unless the authorization is  terminated  or revoked sooner.       Influenza A by PCR NEGATIVE NEGATIVE Final   Influenza B by PCR NEGATIVE NEGATIVE Final    Comment: (NOTE) The Xpert Xpress SARS-CoV-2/FLU/RSV plus assay is intended as an aid in the diagnosis of influenza from Nasopharyngeal swab specimens and should not be used as a sole basis for treatment. Nasal washings and aspirates are unacceptable for Xpert Xpress SARS-CoV-2/FLU/RSV testing.  Fact Sheet for Patients: EntrepreneurPulse.com.au  Fact Sheet for Healthcare Providers: IncredibleEmployment.be  This test is not yet approved or cleared by the [place] FDA and has been authorized for detection and/or diagnosis of SARS-CoV-2 by FDA under an Emergency Use Authorization (EUA). This EUA will remain in  effect (meaning this test can be used) for the duration of the COVID-19 declaration under Section 564(b)(1) of the Act, 21 U.S.C. section 360bbb-3(b)(1), unless the authorization is terminated or revoked.  Performed at [facility], [place] [address], [place],  [address]          Radiology Studies: DG Retrograde Pyelogram  Result Date: 10/08/2020 CLINICAL DATA:  64 year old with left ureter stones. EXAM: RETROGRADE PYELOGRAM COMPARISON:  CT stone study 10/08/2020 FINDINGS: Retrograde pyelogram demonstrates small filling defects in the distal left ureter and findings are suggestive for small stones. Evidence for left ureter stent placement. IMPRESSION: 1. Filling defects in the distal left ureter. Findings are suggestive for stones. 2. Placement of left ureter stent. Electronically Signed   By: Markus Daft M.D.   On: 10/08/2020 17:05   CT Renal Stone Study  Result Date: 10/08/2020 CLINICAL DATA:  Left-sided flank pain EXAM: CT ABDOMEN AND PELVIS WITHOUT CONTRAST TECHNIQUE: Multidetector CT imaging of the abdomen and pelvis was performed following the standard protocol without IV contrast. COMPARISON:  06/06/2020 FINDINGS: Lower chest: No acute abnormality. Hepatobiliary: No focal liver abnormality is seen. No gallstones, gallbladder wall thickening, or biliary dilatation. Pancreas: Unremarkable. No pancreatic ductal dilatation or surrounding inflammatory changes. Spleen: Normal in size without focal abnormality. Adrenals/Urinary Tract: Adrenal glands are within normal limits. Right kidney is unremarkable. Left kidney demonstrates nonobstructing renal calculi measuring approximately 53mm. Additionally mild left-sided hydronephrosis is noted secondary to a small 2-3 mm left mid ureteral stone. The bladder is within normal limits. Left renal cyst is again noted. Stomach/Bowel: Appendix is within normal limits. No obstructive or inflammatory changes of large or small bowel are seen. No  gastric abnormality is noted. Vascular/Lymphatic: Aortic atherosclerosis. No enlarged abdominal or pelvic lymph nodes. Reproductive: Prostate is unremarkable. Other: No abdominal wall hernia or abnormality. No abdominopelvic ascites. Musculoskeletal: No acute or significant osseous findings. IMPRESSION: 2-3 mm partially obstructing left mid ureteral stone. Nonobstructing renal stones are noted on the left the largest of which measures approximately 5 mm. No other focal abnormality is noted. Electronically Signed   By: Inez Catalina M.D.   On: 10/08/2020 09:34        Scheduled Meds: . aspirin EC  81 mg Oral Daily  . Chlorhexidine Gluconate Cloth  6 each Topical Daily  . cilostazol  100 mg Oral BID  . insulin aspart  0-5 Units Subcutaneous QHS  . insulin aspart  0-9 Units Subcutaneous TID WC  . montelukast  10 mg Oral QHS  . pravastatin  40 mg Oral q1800  . tamsulosin  0.4 mg Oral Daily   Continuous Infusions: . sodium chloride    . cefTRIAXone (ROCEPHIN)  IV       LOS: 0 days  Time spent: 35 minutes    Garnie Borchardt A Tyrell Antonio, MD [facility]   If 7PM-7AM, please contact night-coverage [email or web address]  10/09/2020, 7:38 AM

## 2020-10-09 NOTE — Progress Notes (Signed)
1 Day Post-Op  Subjective: [name] is doing well without complaints this morning.  His urine is clearing and he is afebrile but his Cr. Is up.  Urine culture pending.   He had a severe UTI with pyonephrosis noted at cystoscopy. ROS:  Review of Systems  All other systems reviewed and are negative.   Anti-infectives: Anti-infectives (From admission, onward)   Start     Dose/Rate Route Frequency Ordered Stop   10/09/20 1330  cefTRIAXone (ROCEPHIN) 1 g in sodium chloride 0.9 % 100 mL IVPB        1 g 200 mL/hr over 30 Minutes Intravenous Every 24 hours 10/08/20 1309     10/08/20 1215  cefTRIAXone (ROCEPHIN) 1 g in sodium chloride 0.9 % 100 mL IVPB  Status:  Discontinued        1 g 200 mL/hr over 30 Minutes Intravenous  Once 10/08/20 1210 10/08/20 1210   10/08/20 1215  cefTRIAXone (ROCEPHIN) 1 g in sodium chloride 0.9 % 100 mL IVPB        1 g 200 mL/hr over 30 Minutes Intravenous  Once 10/08/20 1211 10/08/20 1359      Current Facility-Administered Medications  Medication Dose Route Frequency Provider Last Rate Last Admin  . 0.9 %  sodium chloride infusion   Intravenous Continuous Irine Seal, MD 100 mL/hr at 10/09/20 0225 New Bag at 10/09/20 0225  . acetaminophen (TYLENOL) tablet 650 mg  650 mg Oral Q6H PRN Irine Seal, MD       Or  . acetaminophen (TYLENOL) suppository 650 mg  650 mg Rectal Q6H PRN Irine Seal, MD      . amLODipine (NORVASC) tablet 10 mg  10 mg Oral Daily Irine Seal, MD   10 mg at 10/08/20 1329  . aspirin EC tablet 81 mg  81 mg Oral Daily Irine Seal, MD   81 mg at 10/08/20 1329  . cefTRIAXone (ROCEPHIN) 1 g in sodium chloride 0.9 % 100 mL IVPB  1 g Intravenous Q24H Irine Seal, MD      . Chlorhexidine Gluconate Cloth 2 % PADS 6 each  6 each Topical Daily Wynetta Fines T, MD      . cilostazol (PLETAL) tablet 100 mg  100 mg Oral BID Irine Seal, MD   100 mg at 10/08/20 2149  . gabapentin (NEURONTIN) capsule 300 mg  300 mg Oral TID PRN Irine Seal, MD      . hydrALAZINE  (APRESOLINE) tablet 25 mg  25 mg Oral Q6H PRN Irine Seal, MD      . HYDROmorphone (DILAUDID) injection 0.5-1 mg  0.5-1 mg Intravenous Q2H PRN Irine Seal, MD      . insulin aspart (novoLOG) injection 0-5 Units  0-5 Units Subcutaneous QHS Irine Seal, MD   2 Units at 10/08/20 2151  . insulin aspart (novoLOG) injection 0-9 Units  0-9 Units Subcutaneous TID WC Irine Seal, MD      . methocarbamol (ROBAXIN) tablet 500 mg  500 mg Oral Q8H PRN Irine Seal, MD      . montelukast (SINGULAIR) tablet 10 mg  10 mg Oral QHS Irine Seal, MD   10 mg at 10/08/20 2149  . ondansetron (ZOFRAN) tablet 4 mg  4 mg Oral Q6H PRN Irine Seal, MD       Or  . ondansetron [facility]) injection 4 mg  4 mg Intravenous Q6H PRN Irine Seal, MD      . pravastatin (PRAVACHOL) tablet 40 mg  40 mg Oral q1800 Irine Seal, MD  40 mg at 10/08/20 1844  . tamsulosin (FLOMAX) capsule 0.4 mg  0.4 mg Oral Daily Irine Seal, MD         Objective: Vital signs in last 24 hours: Temp:  [98.7 F (37.1 C)-99.8 F (37.7 C)] 98.8 F (37.1 C) (12/06 0430) Pulse Rate:  [85-124] 95 (12/06 0430) Resp:  [13-26] 18 (12/06 0430) BP: (111-159)/(61-92) 119/74 (12/06 0430) SpO2:  [91 %-100 %] 92 % (12/06 0430) Weight:  [111.1 kg-113.6 kg] 113.6 kg (12/05 1733)  Intake/Output from previous day: 12/05 0701 - 12/06 0700 In: 3347.5 [P.O.:460; I.V.:2137.5; IV Piggyback:750] Out: 1200 [Urine:1200] Intake/Output this shift: No intake/output data recorded.   Physical Exam Vitals reviewed.  Constitutional:      Appearance: Normal appearance.  Neurological:     Mental Status: He is alert.     Lab Results:  Recent Labs    10/08/20 0833 10/09/20 0241  WBC 10.6* 9.7  HGB 11.9* 11.4*  HCT 37.9* 34.3*  PLT 232 184   BMET Recent Labs    10/08/20 0833 10/09/20 0241  NA 138 138  K 5.1 5.1  CL 109 110  CO2 16* 16*  GLUCOSE 101* 286*  BUN 35* 42*  CREATININE 2.29* 2.76*  CALCIUM 8.6* 8.0*   PT/INR Recent Labs    10/08/20 0833  10/09/20 0241  LABPROT 17.4* 19.5*  INR 1.5* 1.7*   ABG No results for input(s): PHART, HCO3 in the last 72 hours.  Invalid input(s): PCO2, PO2  Studies/Results: DG Retrograde Pyelogram  Result Date: 10/08/2020 CLINICAL DATA:  64 year old with left ureter stones. EXAM: RETROGRADE PYELOGRAM COMPARISON:  CT stone study 10/08/2020 FINDINGS: Retrograde pyelogram demonstrates small filling defects in the distal left ureter and findings are suggestive for small stones. Evidence for left ureter stent placement. IMPRESSION: 1. Filling defects in the distal left ureter. Findings are suggestive for stones. 2. Placement of left ureter stent. Electronically Signed   By: Markus Daft M.D.   On: 10/08/2020 17:05   CT Renal Stone Study  Result Date: 10/08/2020 CLINICAL DATA:  Left-sided flank pain EXAM: CT ABDOMEN AND PELVIS WITHOUT CONTRAST TECHNIQUE: Multidetector CT imaging of the abdomen and pelvis was performed following the standard protocol without IV contrast. COMPARISON:  06/06/2020 FINDINGS: Lower chest: No acute abnormality. Hepatobiliary: No focal liver abnormality is seen. No gallstones, gallbladder wall thickening, or biliary dilatation. Pancreas: Unremarkable. No pancreatic ductal dilatation or surrounding inflammatory changes. Spleen: Normal in size without focal abnormality. Adrenals/Urinary Tract: Adrenal glands are within normal limits. Right kidney is unremarkable. Left kidney demonstrates nonobstructing renal calculi measuring approximately 52mm. Additionally mild left-sided hydronephrosis is noted secondary to a small 2-3 mm left mid ureteral stone. The bladder is within normal limits. Left renal cyst is again noted. Stomach/Bowel: Appendix is within normal limits. No obstructive or inflammatory changes of large or small bowel are seen. No gastric abnormality is noted. Vascular/Lymphatic: Aortic atherosclerosis. No enlarged abdominal or pelvic lymph nodes. Reproductive: Prostate is unremarkable.  Other: No abdominal wall hernia or abnormality. No abdominopelvic ascites. Musculoskeletal: No acute or significant osseous findings. IMPRESSION: 2-3 mm partially obstructing left mid ureteral stone. Nonobstructing renal stones are noted on the left the largest of which measures approximately 5 mm. No other focal abnormality is noted. Electronically Signed   By: Inez Catalina M.D.   On: 10/08/2020 09:34     Assessment and Plan: Left ureteral stone with sepsis of urinary origin.  Continue IV antibiotics pending culture then he will need coverage until ureteroscopy in  7-14 days.     AKI. Slightly worse this morning but UOP is good.  I will order the foley out. Continue medical management.      LOS: 0 days    Irine Seal 10/09/2020 [phone] ID: [name], male   DOB: 04/21/1956, 64 y.o.   MRN: [id number]

## 2020-10-09 NOTE — Telephone Encounter (Signed)
Please advise on message below, patient PSP is Dr. Ethelene Hal.

## 2020-10-10 ENCOUNTER — Ambulatory Visit: Payer: Medicare Other | Admitting: Family

## 2020-10-10 DIAGNOSIS — N136 Pyonephrosis: Secondary | ICD-10-CM | POA: Diagnosis not present

## 2020-10-10 DIAGNOSIS — N201 Calculus of ureter: Secondary | ICD-10-CM | POA: Diagnosis not present

## 2020-10-10 DIAGNOSIS — N390 Urinary tract infection, site not specified: Secondary | ICD-10-CM | POA: Diagnosis not present

## 2020-10-10 LAB — GLUCOSE, CAPILLARY
Glucose-Capillary: 107 mg/dL — ABNORMAL HIGH (ref 70–99)
Glucose-Capillary: 161 mg/dL — ABNORMAL HIGH (ref 70–99)
Glucose-Capillary: 119 mg/dL — ABNORMAL HIGH (ref 70–99)
Glucose-Capillary: 125 mg/dL — ABNORMAL HIGH (ref 70–99)

## 2020-10-10 LAB — PROTIME-INR
INR: 1.7 — ABNORMAL HIGH (ref 0.8–1.2)
Prothrombin Time: 19 seconds — ABNORMAL HIGH (ref 11.4–15.2)

## 2020-10-10 LAB — BASIC METABOLIC PANEL
Anion gap: 11 (ref 5–15)
BUN: 41 mg/dL — ABNORMAL HIGH (ref 8–23)
CO2: 17 mmol/L — ABNORMAL LOW (ref 22–32)
Calcium: 8 mg/dL — ABNORMAL LOW (ref 8.9–10.3)
Chloride: 113 mmol/L — ABNORMAL HIGH (ref 98–111)
Creatinine, Ser: 2.13 mg/dL — ABNORMAL HIGH (ref 0.61–1.24)
GFR, Estimated: 34 mL/min — ABNORMAL LOW (ref >60)
Glucose, Bld: 211 mg/dL — ABNORMAL HIGH (ref 70–99)
Potassium: 4.6 mmol/L (ref 3.5–5.1)
Sodium: 141 mmol/L (ref 135–145)

## 2020-10-10 LAB — URINE CULTURE: Culture: >=100000 — AB

## 2020-10-10 MED ORDER — SODIUM BICARBONATE 650 MG PO TABS
650.0000 mg | ORAL_TABLET | Freq: Two times a day (BID) | ORAL | Status: DC
  Administered 2020-10-10 – 2020-10-11 (×2): 650 mg via ORAL
  Filled 2020-10-10 (×2): qty 1

## 2020-10-10 MED ORDER — WARFARIN SODIUM 7.5 MG PO TABS
7.5000 mg | ORAL_TABLET | Freq: Once | ORAL | Status: AC
  Administered 2020-10-10: 7.5 mg via ORAL
  Filled 2020-10-10: qty 1

## 2020-10-10 NOTE — Progress Notes (Signed)
PROGRESS NOTE    [name]  PPJ:[id number] DOB: 1956/10/26 DOA: 10/08/2020 PCP: [name], MD   Brief Narrative: 64 year old with past medical history significant for kidney stone, CKD stage, hypertension, insulin-dependent diabetes, PE on Coumadin, PVD presented with new onset left-sided flank pain and urinary difficulty for 3 days prior to admission.  Patient reported dysuria. Evaluation in the ED CT abdomen showed obstructing left mid ureter 2 to 3 mm stone, bicarb 16, creatinine 2.2, UA cloudy, compatible with UTI. Was evaluated by urology, underwent cystoscopy  left ureteral stent placement and left retrograde pyelogram.    Assessment & Plan:   Active Problems:   Acute UTI   UTI (urinary tract infection)  1-Left obstructing ureteral stone; urinary tract infection:  Patient underwent cystoscopy and left ureteral stent placement and left retrograde pyelogram on 12/5 Continue with IV fluids and IV antibiotics. Urine culture growing Enterobacter Aerogenes.  Appreciate urology evaluation.  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 Presented with a creatinine 2.4, today increased to 2.1. Worsening Kidney function likely obstructive uropathy Would continue with IV fluids for another 24 hours  Will start sodium Bicarb   3-Hypertension: Blood pressure today soft continue to hold ARB and amlodipine.  4-Dehydration: Continue with IV fluids.  5-History of PE; Resume Coumadin And Lovenox for prophylaxis. INR 1.7.  Continue with coumadin.  6-DM; resume lower home dose 70/30. SSI.    Estimated body mass index is 33.04 kg/m as calculated from the following:   Height as of this encounter: 6\' 1"  (1.854 m).   Weight as of this encounter: 113.6 kg.   DVT prophylaxis: Coumadin Code Status: Full Code Family Communication: Care discussed with patient Disposition Plan:  Status is:  Observation  The patient will require care spanning > 2 midnights and should be moved to inpatient because: IV treatments appropriate due to intensity of illness or inability to take PO  Dispo: The patient is from: Home              Anticipated d/c is to: Home              Anticipated d/c date is: 1 day              Patient currently is not medically stable to d/c.        Consultants:   Urology  Procedures:  Patient underwent cystoscopy and left ureteral stent placement and left retrograde pyelogram on 12/5  Antimicrobials:  Ceftriaxone  Subjective: He is feeling better, no new complaints.   Objective: Vitals:   10/09/20 1638 10/09/20 2114 10/10/20 0552 10/10/20 0955  BP: 137/74 131/79 (!) 131/94 134/81  Pulse: (!) 102 97 94 93  Resp: 18 17 18 17   Temp: 98.1 F (36.7 C) 98.7 F (37.1 C) 98.5 F (36.9 C) 98.8 F (37.1 C)  TempSrc: Oral  Oral Oral  SpO2: 96% 96% 97% 96%  Weight:      Height:        Intake/Output Summary (Last 24 hours) at 10/10/2020 1633 Last data filed at 10/10/2020 1329 Gross per 24 hour  Intake 2250.4 ml  Output 200 ml  Net 2050.4 ml   Filed Weights   10/08/20 0809 10/08/20 1733  Weight: 111.1 kg 113.6 kg    Examination:  General exam: NAD Respiratory system: CTA Cardiovascular system: S 1, S 2 RRR Gastrointestinal system: BS present, soft, nt Central nervous  system:  Alert. Follows command   Extremities: Symmetric power.     Data Reviewed: I have personally reviewed following labs and imaging studies  CBC: Recent Labs  Lab 10/08/20 0833 10/09/20 0241  WBC 10.6* 9.7  NEUTROABS 8.0*  --   HGB 11.9* 11.4*  HCT 37.9* 34.3*  MCV 92.2 90.3  PLT 232 010   Basic Metabolic Panel: Recent Labs  Lab 10/08/20 0833 10/09/20 0241 10/10/20 0856  NA 138 138 141  K 5.1 5.1 4.6  CL 109 110 113*  CO2 16* 16* 17*  GLUCOSE 101* 286* 211*  BUN 35* 42* 41*  CREATININE 2.29* 2.76* 2.13*  CALCIUM 8.6* 8.0* 8.0*   GFR: Estimated  Creatinine Clearance: 45.1 mL/min (A) (by C-G formula based on SCr of 2.13 mg/dL (H)). Liver Function Tests: Recent Labs  Lab 10/08/20 0833  AST 26  ALT 24  ALKPHOS 78  BILITOT 0.9  PROT 7.1  ALBUMIN 3.2*   No results for input(s): LIPASE, AMYLASE in the last 168 hours. No results for input(s): AMMONIA in the last 168 hours. Coagulation Profile: Recent Labs  Lab 10/04/20 1419 10/08/20 0833 10/09/20 0241 10/10/20 0316  INR 1.2* 1.5* 1.7* 1.7*   Cardiac Enzymes: No results for input(s): CKTOTAL, CKMB, CKMBINDEX, TROPONINI in the last 168 hours. BNP (last 3 results) No results for input(s): PROBNP in the last 8760 hours. HbA1C: No results for input(s): HGBA1C in the last 72 hours. CBG: Recent Labs  Lab 10/09/20 1100 10/09/20 1703 10/09/20 2115 10/10/20 0655 10/10/20 1119  GLUCAP 340* 222* 133* 107* 161*   Lipid Profile: No results for input(s): CHOL, HDL, LDLCALC, TRIG, CHOLHDL, LDLDIRECT in the last 72 hours. Thyroid Function Tests: No results for input(s): TSH, T4TOTAL, FREET4, T3FREE, THYROIDAB in the last 72 hours. Anemia Panel: No results for input(s): VITAMINB12, FOLATE, FERRITIN, TIBC, IRON, RETICCTPCT in the last 72 hours. Sepsis Labs: No results for input(s): PROCALCITON, LATICACIDVEN in the last 168 hours.  Recent Results (from the past 240 hour(s))  Resp Panel by RT-PCR (Flu A&B, Covid) Nasopharyngeal Swab     Status: None   Collection Time: 10/08/20 10:33 AM   Specimen: Nasopharyngeal Swab; Nasopharyngeal(NP) swabs in vial transport medium  Result Value Ref Range Status   SARS Coronavirus 2 by RT PCR NEGATIVE NEGATIVE Final    Comment: (NOTE) SARS-CoV-2 target nucleic acids are NOT DETECTED.  The SARS-CoV-2 RNA is generally detectable in upper respiratory specimens during the acute phase of infection. The lowest concentration of SARS-CoV-2 viral copies this assay can detect is 138 copies/mL. A negative result does not preclude SARS-Cov-2 infection  and should not be used as the sole basis for treatment or other patient management decisions. A negative result may occur with  improper specimen collection/handling, submission of specimen other than nasopharyngeal swab, presence of viral mutation(s) within the areas targeted by this assay, and inadequate number of viral copies(<138 copies/mL). A negative result must be combined with clinical observations, patient history, and epidemiological information. The expected result is Negative.  Fact Sheet for Patients:  EntrepreneurPulse.com.au  Fact Sheet for Healthcare Providers:  IncredibleEmployment.be  This test is no t yet approved or cleared by the [place] FDA and  has been authorized for detection and/or diagnosis of SARS-CoV-2 by FDA under an Emergency Use Authorization (EUA). This EUA will remain  in effect (meaning this test can be used) for the duration of the COVID-19 declaration under Section 564(b)(1) of the Act, 21 U.S.C.section 360bbb-3(b)(1), unless the authorization is terminated  or revoked sooner.       Influenza A by PCR NEGATIVE NEGATIVE Final   Influenza B by PCR NEGATIVE NEGATIVE Final    Comment: (NOTE) The Xpert Xpress SARS-CoV-2/FLU/RSV plus assay is intended as an aid in the diagnosis of influenza from Nasopharyngeal swab specimens and should not be used as a sole basis for treatment. Nasal washings and aspirates are unacceptable for Xpert Xpress SARS-CoV-2/FLU/RSV testing.  Fact Sheet for Patients: EntrepreneurPulse.com.au  Fact Sheet for Healthcare Providers: IncredibleEmployment.be  This test is not yet approved or cleared by the [place] FDA and has been authorized for detection and/or diagnosis of SARS-CoV-2 by FDA under an Emergency Use Authorization (EUA). This EUA will remain in effect (meaning this test can be used) for the duration of the COVID-19 declaration  under Section 564(b)(1) of the Act, 21 U.S.C. section 360bbb-3(b)(1), unless the authorization is terminated or revoked.  Performed at [facility], [place] [address] [place], [place] [address]   Urine culture     Status: Abnormal   Collection Time: 10/08/20 10:54 AM   Specimen: Urine, Random  Result Value Ref Range Status   Specimen Description URINE, RANDOM  Final   Special Requests   Final    NONE Performed at [facility], [place] [address], [place], [place] [address]    Culture >=100,000 COLONIES/mL ENTEROBACTER AEROGENES (A)  Final   Report Status 10/10/2020 FINAL  Final   Organism ID, Bacteria ENTEROBACTER AEROGENES (A)  Final      Susceptibility   Enterobacter aerogenes - MIC*    CEFAZOLIN >=64 RESISTANT Resistant     CEFEPIME <=0.12 SENSITIVE Sensitive     CEFTRIAXONE <=0.25 SENSITIVE Sensitive     CIPROFLOXACIN <=0.25 SENSITIVE Sensitive     GENTAMICIN <=1 SENSITIVE Sensitive     IMIPENEM 1 SENSITIVE Sensitive     NITROFURANTOIN 64 INTERMEDIATE Intermediate     TRIMETH/SULFA <=20 SENSITIVE Sensitive     PIP/TAZO <=4 SENSITIVE Sensitive     * >=100,000 COLONIES/mL ENTEROBACTER AEROGENES         Radiology Studies: DG Retrograde Pyelogram  Result Date: 10/08/2020 CLINICAL DATA:  64 year old with left ureter stones. EXAM: RETROGRADE PYELOGRAM COMPARISON:  CT stone study 10/08/2020 FINDINGS: Retrograde pyelogram demonstrates small filling defects in the distal left ureter and findings are suggestive for small stones. Evidence for left ureter stent placement. IMPRESSION: 1. Filling defects in the distal left ureter. Findings are suggestive for stones. 2. Placement of left ureter stent. Electronically Signed   By: Markus Daft M.D.   On: 10/08/2020 17:05        Scheduled Meds: . aspirin EC  81 mg Oral Daily  . Chlorhexidine Gluconate Cloth  6 each Topical Daily  . cilostazol  100 mg Oral BID  . enoxaparin (LOVENOX) injection  60 mg Subcutaneous Q24H  .  insulin aspart  0-5 Units Subcutaneous QHS  . insulin aspart  0-9 Units Subcutaneous TID WC  . insulin aspart protamine- aspart  20 Units Subcutaneous BID WC  . montelukast  10 mg Oral QHS  . pravastatin  40 mg Oral q1800  . tamsulosin  0.4 mg Oral Daily  . warfarin  7.5 mg Oral ONCE-1600  . Warfarin - Pharmacist Dosing Inpatient   Does not apply q1600   Continuous Infusions: . sodium chloride 100 mL/hr at 10/10/20 0328  . cefTRIAXone (ROCEPHIN)  IV 1 g (10/10/20 1327)     LOS: 1 day    Time spent: 35 minutes  Elmarie Shiley, MD [facility]   If 7PM-7AM, please contact night-coverage [email or web address]  10/10/2020, 4:33 PM

## 2020-10-10 NOTE — Telephone Encounter (Signed)
Spoke with Pam at [facility] per [facility] patient will be coming home from the hospital either this evening or tomorrow, they are needing PCP to write Rx and instructions on when to stop Warfarin and start Lovenox before his upcoming procedure on 10/19/20. Will we need to call patient when he is home form the hospital. Please advise.

## 2020-10-10 NOTE — Progress Notes (Signed)
ANTICOAGULATION CONSULT NOTE - [facility] for Coumadin Indication: hx DVT and PE  Allergies  Allergen Reactions  . Lisinopril Other (See Comments)    Patient states that this medication makes him feel "off". Tolerates losartan.    Patient Measurements: Height: 6\' 1"  (185.4 cm) Weight: 113.6 kg (250 lb 7.1 oz) IBW/kg (Calculated) : 79.9  Vital Signs: Temp: 98.5 F (36.9 C) (12/07 0552) Temp Source: Oral (12/07 0552) BP: 131/94 (12/07 0552) Pulse Rate: 94 (12/07 0552)  Labs: Recent Labs    10/08/20 0833 10/09/20 0241 10/10/20 0316  HGB 11.9* 11.4*  --   HCT 37.9* 34.3*  --   PLT 232 184  --   LABPROT 17.4* 19.5* 19.0*  INR 1.5* 1.7* 1.7*  CREATININE 2.29* 2.76*  --     Estimated Creatinine Clearance: 34.8 mL/min (A) (by C-G formula based on SCr of 2.76 mg/dL (H)).   Medical History: Past Medical History:  Diagnosis Date  . Arthritis   . Diabetes mellitus without complication ([place])   . History of kidney stones   . History of pulmonary embolus (PE)   . Hypertension   . Lipidemia   . Prostate enlargement     Medications:  Medications Prior to Admission  Medication Sig Dispense Refill Last Dose  . Accu-Chek Softclix Lancets lancets Use as instructed to check blood sugar 3 times daily Dx is E11.65 100 each 2 unknown at unknown  . acetaminophen (TYLENOL 8 HOUR ARTHRITIS PAIN) 650 MG CR tablet Take 650 mg by mouth in the morning and at bedtime.    10/07/2020 at Unknown time  . amLODipine (NORVASC) 10 MG tablet Take 1 tablet (10 mg total) by mouth daily. 90 tablet 3 10/07/2020 at Unknown time  . aspirin EC 81 MG tablet Take 81 mg by mouth daily.   10/07/2020 at Unknown time  . Blood Glucose Monitoring Suppl (ACCU-CHEK AVIVA) device by Other route. Use as instructed   unknown at unknown  . cilostazol (PLETAL) 100 MG tablet TAKE 1 TABLET BY MOUTH TWICE A DAY (Patient taking differently: Take 100 mg by mouth 2 (two) times daily. ) 180 tablet 1  10/07/2020 at Unknown time  . gabapentin (NEURONTIN) 300 MG capsule Take 300 mg by mouth 3 (three) times daily as needed (pain).    10/08/2020 at Unknown time  . glucose blood (ACCU-CHEK GUIDE) test strip Use to check blood sugar 3 times daily.E11.65 100 each 12 unknown at unknown  . insulin NPH-regular Human (70-30) 100 UNIT/ML injection Inject 50 Units into the skin in the morning and at bedtime.   10/07/2020 at Unknown time  . Insulin Syringes, Disposable, U-100 1 ML MISC 1 Device by Does not apply route as directed. 100 each 11 unknown at unknown  . losartan (COZAAR) 50 MG tablet Take 1 tablet (50 mg total) by mouth daily. 90 tablet 3 10/07/2020 at Unknown time  . lovastatin (MEVACOR) 40 MG tablet Take 1 tablet (40 mg total) by mouth at bedtime. 90 tablet 2 10/07/2020 at Unknown time  . montelukast (SINGULAIR) 10 MG tablet TAKE 1 TABLET BY MOUTH EVERY DAY (Patient taking differently: Take 10 mg by mouth at bedtime. ) 90 tablet 1 10/07/2020 at Unknown time  . warfarin (COUMADIN) 5 MG tablet TAKE 1 TABLET (5 MG TOTAL) BY MOUTH DAILY. TAKE AS DIRECTED. (Patient taking differently: Take 5-7.5 mg by mouth daily. Take one 5 mg tablet by mouth daily except on Thursdays and Saturdays take7.5 mg tablet) 90 tablet 2  10/07/2020 at 0900  . insulin NPH-regular Human (HUMULIN 70/30) (70-30) 100 UNIT/ML injection Inject 50 Units into the skin daily with breakfast AND 52 Units daily with supper. INJECT 52 UNITS INTO THE SKIN 2 (TWO) TIMES DAILY WITH A MEAL.. (Patient not taking: Reported on 10/08/2020) 40 mL 11 Not Taking at Unknown time  . methocarbamol (ROBAXIN) 500 MG tablet Take 1 tablet (500 mg total) by mouth every 8 (eight) hours as needed for muscle spasms. (Patient not taking: Reported on 10/08/2020) 30 tablet 0 Not Taking at Unknown time    Assessment: 64 yo M admitted with new onset left-sided flank pain and urinary difficulty x 3 days.  CT abd showed ureter obstruction.  Pt was on Coumadin PTA which was held  prior to urology procedure.  INR today remains subtherapeutic at 1.7.  MD also requests starting Lovenox for VTE ppx until INR therapeutic.  PTA dose: Coumadin 5mg  daily except 7.5mg  on Mon/Thurs  Goal of Therapy:  INR 2-3 Monitor platelets by anticoagulation protocol: Yes   Plan:  Repeat Coumadin 7.5mg  PO x 1 tonight Daily INR Lovenox 60mg  SQ q24h  [name], Pharm.D., BCPS Clinical Pharmacist Clinical phone for 10/10/2020 from 8:30-4:00 is [phone].  **Pharmacist phone directory can be found on [email or web address] listed under [email or web address].  10/10/2020 9:20 AM   [name], [place]s 10/10/2020,9:20 AM

## 2020-10-11 DIAGNOSIS — N136 Pyonephrosis: Secondary | ICD-10-CM | POA: Diagnosis not present

## 2020-10-11 DIAGNOSIS — N201 Calculus of ureter: Secondary | ICD-10-CM | POA: Diagnosis not present

## 2020-10-11 LAB — BASIC METABOLIC PANEL
Anion gap: 11 (ref 5–15)
BUN: 33 mg/dL — ABNORMAL HIGH (ref 8–23)
CO2: 17 mmol/L — ABNORMAL LOW (ref 22–32)
Calcium: 8.1 mg/dL — ABNORMAL LOW (ref 8.9–10.3)
Chloride: 112 mmol/L — ABNORMAL HIGH (ref 98–111)
Creatinine, Ser: 1.74 mg/dL — ABNORMAL HIGH (ref 0.61–1.24)
GFR, Estimated: 43 mL/min — ABNORMAL LOW (ref >60)
Glucose, Bld: 140 mg/dL — ABNORMAL HIGH (ref 70–99)
Potassium: 4.4 mmol/L (ref 3.5–5.1)
Sodium: 140 mmol/L (ref 135–145)

## 2020-10-11 LAB — PROTIME-INR
INR: 1.7 — ABNORMAL HIGH (ref 0.8–1.2)
Prothrombin Time: 19.4 seconds — ABNORMAL HIGH (ref 11.4–15.2)

## 2020-10-11 LAB — GLUCOSE, CAPILLARY
Glucose-Capillary: 136 mg/dL — ABNORMAL HIGH (ref 70–99)
Glucose-Capillary: 132 mg/dL — ABNORMAL HIGH (ref 70–99)

## 2020-10-11 MED ORDER — CIPROFLOXACIN HCL 250 MG PO TABS
250.0000 mg | ORAL_TABLET | Freq: Two times a day (BID) | ORAL | Status: DC
  Administered 2020-10-11: 250 mg via ORAL
  Filled 2020-10-11: qty 1

## 2020-10-11 MED ORDER — CEFDINIR 300 MG PO CAPS
300.0000 mg | ORAL_CAPSULE | Freq: Two times a day (BID) | ORAL | Status: DC
  Filled 2020-10-11: qty 1

## 2020-10-11 MED ORDER — TAMSULOSIN HCL 0.4 MG PO CAPS
0.4000 mg | ORAL_CAPSULE | Freq: Every day | ORAL | 0 refills | Status: DC
Start: 2020-10-11 — End: 2020-11-28

## 2020-10-11 MED ORDER — CEFDINIR 300 MG PO CAPS
300.0000 mg | ORAL_CAPSULE | Freq: Two times a day (BID) | ORAL | 0 refills | Status: DC
Start: 2020-10-11 — End: 2020-10-11

## 2020-10-11 MED ORDER — INSULIN NPH ISOPHANE & REGULAR (70-30) 100 UNIT/ML ~~LOC~~ SUSP
20.0000 [IU] | Freq: Two times a day (BID) | SUBCUTANEOUS | 11 refills | Status: DC
Start: 2020-10-11 — End: 2021-02-20

## 2020-10-11 MED ORDER — SODIUM BICARBONATE 650 MG PO TABS
650.0000 mg | ORAL_TABLET | Freq: Two times a day (BID) | ORAL | 0 refills | Status: DC
Start: 2020-10-11 — End: 2020-11-28

## 2020-10-11 MED ORDER — CIPROFLOXACIN HCL 250 MG PO TABS
250.0000 mg | ORAL_TABLET | Freq: Two times a day (BID) | ORAL | 0 refills | Status: DC
Start: 2020-10-11 — End: 2020-10-13

## 2020-10-11 MED ORDER — SACCHAROMYCES BOULARDII 250 MG PO CAPS: 250.0000 mg | ORAL_CAPSULE | Freq: Two times a day (BID) | ORAL | 0 refills | Status: DC

## 2020-10-11 NOTE — Progress Notes (Signed)
ANTICOAGULATION CONSULT NOTE - Follow-Up Consult  Pharmacy Consult for Coumadin Indication: hx DVT and PE  Patient Measurements: Height: 6\' 1"  (185.4 cm) Weight: 113.6 kg (250 lb 7.1 oz) IBW/kg (Calculated) : 79.9  Vital Signs: Temp: 98.7 F (37.1 C) (12/08 0513) Temp Source: Oral (12/08 0513) BP: 148/91 (12/08 0513) Pulse Rate: 96 (12/08 0513)  Labs: Recent Labs    10/09/20 0241 10/10/20 0316 10/10/20 0856 10/11/20 0321  HGB 11.4*  --   --   --   HCT 34.3*  --   --   --   PLT 184  --   --   --   LABPROT 19.5* 19.0*  --  19.4*  INR 1.7* 1.7*  --  1.7*  CREATININE 2.76*  --  2.13* 1.74*    Estimated Creatinine Clearance: 55.2 mL/min (A) (by C-G formula based on SCr of 1.74 mg/dL (H)).   Medications:  Medications Prior to Admission  Medication Sig Dispense Refill Last Dose  . Accu-Chek Softclix Lancets lancets Use as instructed to check blood sugar 3 times daily Dx is E11.65 100 each 2 unknown at unknown  . acetaminophen (TYLENOL 8 HOUR ARTHRITIS PAIN) 650 MG CR tablet Take 650 mg by mouth in the morning and at bedtime.    10/07/2020 at Unknown time  . amLODipine (NORVASC) 10 MG tablet Take 1 tablet (10 mg total) by mouth daily. 90 tablet 3 10/07/2020 at Unknown time  . aspirin EC 81 MG tablet Take 81 mg by mouth daily.   10/07/2020 at Unknown time  . Blood Glucose Monitoring Suppl (ACCU-CHEK AVIVA) device by Other route. Use as instructed   unknown at unknown  . cilostazol (PLETAL) 100 MG tablet TAKE 1 TABLET BY MOUTH TWICE A DAY (Patient taking differently: Take 100 mg by mouth 2 (two) times daily. ) 180 tablet 1 10/07/2020 at Unknown time  . gabapentin (NEURONTIN) 300 MG capsule Take 300 mg by mouth 3 (three) times daily as needed (pain).    10/08/2020 at Unknown time  . glucose blood (ACCU-CHEK GUIDE) test strip Use to check blood sugar 3 times daily.E11.65 100 each 12 unknown at unknown  . Insulin Syringes, Disposable, U-100 1 ML MISC 1 Device by Does not apply route as  directed. 100 each 11 unknown at unknown  . losartan (COZAAR) 50 MG tablet Take 1 tablet (50 mg total) by mouth daily. 90 tablet 3 10/07/2020 at Unknown time  . lovastatin (MEVACOR) 40 MG tablet Take 1 tablet (40 mg total) by mouth at bedtime. 90 tablet 2 10/07/2020 at Unknown time  . montelukast (SINGULAIR) 10 MG tablet TAKE 1 TABLET BY MOUTH EVERY DAY (Patient taking differently: Take 10 mg by mouth at bedtime. ) 90 tablet 1 10/07/2020 at Unknown time  . warfarin (COUMADIN) 5 MG tablet TAKE 1 TABLET (5 MG TOTAL) BY MOUTH DAILY. TAKE AS DIRECTED. (Patient taking differently: Take 5-7.5 mg by mouth daily. Take one 5 mg tablet by mouth daily except on Thursdays and Saturdays take7.5 mg tablet) 90 tablet 2 10/07/2020 at 0900  . [DISCONTINUED] insulin NPH-regular Human (70-30) 100 UNIT/ML injection Inject 50 Units into the skin in the morning and at bedtime.   10/07/2020 at Unknown time  . insulin NPH-regular Human (HUMULIN 70/30) (70-30) 100 UNIT/ML injection Inject 50 Units into the skin daily with breakfast AND 52 Units daily with supper. INJECT 52 UNITS INTO THE SKIN 2 (TWO) TIMES DAILY WITH A MEAL.. (Patient not taking: Reported on 10/08/2020) 40 mL 11 Not Taking  at Unknown time  . methocarbamol (ROBAXIN) 500 MG tablet Take 1 tablet (500 mg total) by mouth every 8 (eight) hours as needed for muscle spasms. (Patient not taking: Reported on 10/08/2020) 30 tablet 0 Not Taking at Unknown time    Assessment: 64 yo M admitted with new onset left-sided flank pain and urinary difficulty x 3 days.  CT abd showed ureter obstruction.  Pt was on Coumadin PTA which was held prior to urology procedure.  INR today remains subtherapeutic at 1.7.  MD also requests starting Lovenox for VTE ppx until INR therapeutic.  PTA dose: Coumadin 5mg  daily except 7.5mg  on Mon/Thurs  INR today remains SUBtherapeutic (INR 1.7 << 1.7). Noted plans to switch Rocephin to Cipro today and discharge the patient home. Cipro has a noted  interaction with Warfarin and will increase warfarin sensitivity.   Discussed with MD - will resume the patient's PTA dose given that the INR is still <2, the patient has a follow-up scheduled at the Warfarin clinic on Friday, 12/10.   Goal of Therapy:  INR 2-3 Monitor platelets by anticoagulation protocol: Yes   Plan:  - Warfarin 5 mg x 1 dose this evening (as per PTA dosing) - Noted plans for discharge - would recommend to continue PTA dosing until follow at anticoagulation clinic planned for Friday, 12/10 (5 mg on 12/8, 7.5 mg on 12/9).   Thank you for allowing pharmacy to be a part of this patient's care.  Alycia Rossetti, PharmD, BCPS Clinical Pharmacist Clinical phone for 10/11/2020: [phone] 10/11/2020 9:20 AM   **Pharmacist phone directory can now be found on [email or web address] ([email or web address]).  Listed under [place].

## 2020-10-11 NOTE — Progress Notes (Signed)
DISCHARGE NOTE HOME  Million Maharaj to be discharged Home per MD order. Discussed prescriptions and follow up appointments with the patient. Prescriptions given to patient; medication list explained in detail. Patient verbalized understanding.  Skin clean, dry and intact without evidence of skin break down, no evidence of skin tears noted. IV catheter discontinued intact. Site without signs and symptoms of complications. Dressing and pressure applied. Pt denies pain at the site currently. No complaints noted.  Patient free of lines, drains, and wounds.   An After Visit Summary (AVS) was printed and given to the patient. Patient escorted via wheelchair, and discharged home via private auto.  Beatris Ship, RN

## 2020-10-11 NOTE — Discharge Summary (Signed)
Physician Discharge Summary  [name] YOM:[id number] DOB: 07-Oct-1956 DOA: 10/08/2020  PCP: [name], MD  Admit date: 10/08/2020 Discharge date: 10/11/2020  Admitted From: Home  Disposition: Home   Recommendations for Outpatient Follow-up:  1. Follow up with PCP in 1-2 weeks 2. Please obtain BMP/CBC in one week 3. He needs INR on Friday, adjust coumadin as needed. 4. He needs to follow up with urology on  12-16 for ureteroscopy.  He will need Lovenox bridge, adjustment of coumadin prior to procedure.    Home Health: none  Discharge Condition: Stable.  CODE STATUS: Full code Diet recommendation: Heart Healthy / Carb Modified / Regular / Dysphagia   Brief/Interim Summary: 64 year old with past medical history significant for kidney stone, CKD stage, hypertension, insulin-dependent diabetes, PE on Coumadin, PVD presented with new onset left-sided flank pain and urinary difficulty for 3 days prior to admission.  Patient reported dysuria. Evaluation in the ED CT abdomen showed obstructing left mid ureter 2 to 3 mm stone, bicarb 16, creatinine 2.2, UA cloudy, compatible with UTI. Was evaluated by urology, underwent cystoscopy  left ureteral stent placement and left retrograde pyelogram.     1-Left obstructing ureteral stone; urinary tract infection:  Sepsis was ruled out.  Patient underwent cystoscopy and left ureteral stent placement and left retrograde pyelogram on 12/5 Continue with IV fluids and IV antibiotics. Urine culture growing Enterobacter Aerogenes. He will be discharge on Ciprofloxacin, will provide 10 days of antibiotics.  Appreciate urology evaluation. Patient will need ureteroscopy on 12-16. Will provide 10 days of antibiotics.   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 He is CKD stage IIIb.  Presented with a creatinine 2.4, cr down to 1.7 better than prior baseline.  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 Repeat B-met out patient.  He was treated with IV fluids.  Continue to hold ARB at discharge.   3-Hypertension: B Continue to hold ARB (due to AKI)  and resume  Amlodipine at discharge. [name]  4-Dehydration: Treated with IV fluids.   5-History of PE; INR 1.7.  Continue with coumadin.  Close follow up for INR, suspect will increase now he is on oral antibiotics.   6-DM; Resume lower home dose 70/30.     Discharge Diagnoses:  Active Problems:   Acute UTI   UTI (urinary tract infection)    Discharge Instructions  Discharge Instructions    Diet - low sodium heart healthy   Complete by: As directed    Increase activity slowly   Complete by: As directed    No wound care   Complete by: As directed      Allergies as of 10/11/2020      Reactions   Lisinopril Other (See Comments)   Patient states that this medication makes him feel "off". Tolerates losartan.      Medication List    STOP taking these medications   losartan 50 MG tablet Commonly known as: COZAAR   methocarbamol 500 MG tablet Commonly known as: Robaxin     TAKE these medications   Accu-Chek Aviva device by Other route. Use as instructed   Accu-Chek Guide test strip Generic drug: glucose blood Use to check blood sugar 3 times daily.E11.65   Accu-Chek Softclix Lancets lancets Use as instructed to check blood sugar 3 times daily Dx is E11.65   amLODipine 10 MG tablet Commonly known as: NORVASC Take 1  tablet (10 mg total) by mouth daily.   aspirin EC 81 MG tablet Take 81 mg by mouth daily.   cilostazol 100 MG tablet Commonly known as: PLETAL TAKE 1 TABLET BY MOUTH TWICE A DAY   ciprofloxacin 250 MG tablet Commonly known as: CIPRO Take 1 tablet (250 mg total) by mouth 2 (two) times daily for 10 days.   gabapentin 300 MG capsule Commonly known as: NEURONTIN Take 300 mg by mouth 3 (three) times daily as needed (pain).    insulin NPH-regular Human (70-30) 100 UNIT/ML injection Inject 20 Units into the skin in the morning and at bedtime. What changed:   how much to take  Another medication with the same name was removed. Continue taking this medication, and follow the directions you see here.   Insulin Syringes (Disposable) U-100 1 ML Misc 1 Device by Does not apply route as directed.   lovastatin 40 MG tablet Commonly known as: MEVACOR Take 1 tablet (40 mg total) by mouth at bedtime.   montelukast 10 MG tablet Commonly known as: SINGULAIR TAKE 1 TABLET BY MOUTH EVERY DAY What changed: when to take this   saccharomyces boulardii 250 MG capsule Commonly known as: Florastor Take 1 capsule (250 mg total) by mouth 2 (two) times daily.   sodium bicarbonate 650 MG tablet Take 1 tablet (650 mg total) by mouth 2 (two) times daily.   tamsulosin 0.4 MG Caps capsule Commonly known as: FLOMAX Take 1 capsule (0.4 mg total) by mouth daily.   Tylenol 8 Hour Arthritis Pain 650 MG CR tablet Generic drug: acetaminophen Take 650 mg by mouth in the morning and at bedtime.   warfarin 5 MG tablet Commonly known as: COUMADIN Take as directed. If you are unsure how to take this medication, talk to your nurse or doctor. Original instructions: TAKE 1 TABLET (5 MG TOTAL) BY MOUTH DAILY. TAKE AS DIRECTED. What changed:   how much to take  additional instructions       Follow-up Information    [name], MD Follow up in 2 day(s).   Specialty: Family Medicine Why: You need INR check on Friday.  you need follow up for your kidney function.  Contact information: [place] [address] [phone]        Elouise Munroe, MD .   Specialties: Cardiology, Radiology Contact information: [address] [place] [address] [phone]        Irine Seal, MD Follow up in 1 week(s).   Specialty: Urology Why: follow up with Dr Roni Bread for further care of  ureteral stent .  you have procedure 12-16. office will call you with detail.  Contact information: [address] [place] [address] [phone]              Allergies  Allergen Reactions  . Lisinopril Other (See Comments)    Patient states that this medication makes him feel "off". Tolerates losartan.    Consultations:  Urology, Dr Roni Bread.    Procedures/Studies: DG Retrograde Pyelogram  Result Date: 10/08/2020 CLINICAL DATA:  64 year old with left ureter stones. EXAM: RETROGRADE PYELOGRAM COMPARISON:  CT stone study 10/08/2020 FINDINGS: Retrograde pyelogram demonstrates small filling defects in the distal left ureter and findings are suggestive for small stones. Evidence for left ureter stent placement. IMPRESSION: 1. Filling defects in the distal left ureter. Findings are suggestive for stones. 2. Placement of left ureter stent. Electronically Signed   By: Scherrie Gerlach.D.  On: 10/08/2020 17:05   CT Renal Stone Study  Result Date: 10/08/2020 CLINICAL DATA:  Left-sided flank pain EXAM: CT ABDOMEN AND PELVIS WITHOUT CONTRAST TECHNIQUE: Multidetector CT imaging of the abdomen and pelvis was performed following the standard protocol without IV contrast. COMPARISON:  06/06/2020 FINDINGS: Lower chest: No acute abnormality. Hepatobiliary: No focal liver abnormality is seen. No gallstones, gallbladder wall thickening, or biliary dilatation. Pancreas: Unremarkable. No pancreatic ductal dilatation or surrounding inflammatory changes. Spleen: Normal in size without focal abnormality. Adrenals/Urinary Tract: Adrenal glands are within normal limits. Right kidney is unremarkable. Left kidney demonstrates nonobstructing renal calculi measuring approximately 62mm. Additionally mild left-sided hydronephrosis is noted secondary to a small 2-3 mm left mid ureteral stone. The bladder is within normal limits. Left renal cyst is again noted. Stomach/Bowel: Appendix is within normal limits. No obstructive  or inflammatory changes of large or small bowel are seen. No gastric abnormality is noted. Vascular/Lymphatic: Aortic atherosclerosis. No enlarged abdominal or pelvic lymph nodes. Reproductive: Prostate is unremarkable. Other: No abdominal wall hernia or abnormality. No abdominopelvic ascites. Musculoskeletal: No acute or significant osseous findings. IMPRESSION: 2-3 mm partially obstructing left mid ureteral stone. Nonobstructing renal stones are noted on the left the largest of which measures approximately 5 mm. No other focal abnormality is noted. Electronically Signed   By: Inez Catalina M.D.   On: 10/08/2020 09:34     Subjective: He is feeling well. Denies pain   Discharge Exam: Vitals:   10/11/20 0513 10/11/20 1044  BP: (!) 148/91 (!) 143/89  Pulse: 96 92  Resp: 20 17  Temp: 98.7 F (37.1 C) 99 F (37.2 C)  SpO2: 92% 96%     General: Pt is alert, awake, not in acute distress Cardiovascular: RRR, S1/S2 +, no rubs, no gallops Respiratory: CTA bilaterally, no wheezing, no rhonchi Abdominal: Soft, NT, ND, bowel sounds + Extremities: no edema, no cyanosis    The results of significant diagnostics from this hospitalization (including imaging, microbiology, ancillary and laboratory) are listed below for reference.     Microbiology: Recent Results (from the past 240 hour(s))  Resp Panel by RT-PCR (Flu A&B, Covid) Nasopharyngeal Swab     Status: None   Collection Time: 10/08/20 10:33 AM   Specimen: Nasopharyngeal Swab; Nasopharyngeal(NP) swabs in vial transport medium  Result Value Ref Range Status   SARS Coronavirus 2 by RT PCR NEGATIVE NEGATIVE Final    Comment: (NOTE) SARS-CoV-2 target nucleic acids are NOT DETECTED.  The SARS-CoV-2 RNA is generally detectable in upper respiratory specimens during the acute phase of infection. The lowest concentration of SARS-CoV-2 viral copies this assay can detect is 138 copies/mL. A negative result does not preclude SARS-Cov-2 infection  and should not be used as the sole basis for treatment or other patient management decisions. A negative result may occur with  improper specimen collection/handling, submission of specimen other than nasopharyngeal swab, presence of viral mutation(s) within the areas targeted by this assay, and inadequate number of viral copies(<138 copies/mL). A negative result must be combined with clinical observations, patient history, and epidemiological information. The expected result is Negative.  Fact Sheet for Patients:  EntrepreneurPulse.com.au  Fact Sheet for Healthcare Providers:  IncredibleEmployment.be  This test is no t yet approved or cleared by the [place] FDA and  has been authorized for detection and/or diagnosis of SARS-CoV-2 by FDA under an Emergency Use Authorization (EUA). This EUA will remain  in effect (meaning this test can be used) for the duration of the COVID-19 declaration  under Section 564(b)(1) of the Act, 21 U.S.C.section 360bbb-3(b)(1), unless the authorization is terminated  or revoked sooner.       Influenza A by PCR NEGATIVE NEGATIVE Final   Influenza B by PCR NEGATIVE NEGATIVE Final    Comment: (NOTE) The Xpert Xpress SARS-CoV-2/FLU/RSV plus assay is intended as an aid in the diagnosis of influenza from Nasopharyngeal swab specimens and should not be used as a sole basis for treatment. Nasal washings and aspirates are unacceptable for Xpert Xpress SARS-CoV-2/FLU/RSV testing.  Fact Sheet for Patients: EntrepreneurPulse.com.au  Fact Sheet for Healthcare Providers: IncredibleEmployment.be  This test is not yet approved or cleared by the [place] FDA and has been authorized for detection and/or diagnosis of SARS-CoV-2 by FDA under an Emergency Use Authorization (EUA). This EUA will remain in effect (meaning this test can be used) for the duration of the COVID-19 declaration  under Section 564(b)(1) of the Act, 21 U.S.C. section 360bbb-3(b)(1), unless the authorization is terminated or revoked.  Performed at [facility], [place] [address], [place], [place] [address]   Urine culture     Status: Abnormal   Collection Time: 10/08/20 10:54 AM   Specimen: Urine, Random  Result Value Ref Range Status   Specimen Description URINE, RANDOM  Final   Special Requests   Final    NONE Performed at [facility], [place] [address] [place],  [address]    Culture >=100,000 COLONIES/mL ENTEROBACTER AEROGENES (A)  Final   Report Status 10/10/2020 FINAL  Final   Organism ID, Bacteria ENTEROBACTER AEROGENES (A)  Final      Susceptibility   Enterobacter aerogenes - MIC*    CEFAZOLIN >=64 RESISTANT Resistant     CEFEPIME <=0.12 SENSITIVE Sensitive     CEFTRIAXONE <=0.25 SENSITIVE Sensitive     CIPROFLOXACIN <=0.25 SENSITIVE Sensitive     GENTAMICIN <=1 SENSITIVE Sensitive     IMIPENEM 1 SENSITIVE Sensitive     NITROFURANTOIN 64 INTERMEDIATE Intermediate     TRIMETH/SULFA <=20 SENSITIVE Sensitive     PIP/TAZO <=4 SENSITIVE Sensitive     * >=100,000 COLONIES/mL ENTEROBACTER AEROGENES     Labs: BNP (last 3 results) Recent Labs    02/08/20 1849  BNP 23.5   Basic Metabolic Panel: Recent Labs  Lab 10/08/20 0833 10/09/20 0241 10/10/20 0856 10/11/20 0321  NA 138 138 141 140  K 5.1 5.1 4.6 4.4  CL 109 110 113* 112*  CO2 16* 16* 17* 17*  GLUCOSE 101* 286* 211* 140*  BUN 35* 42* 41* 33*  CREATININE 2.29* 2.76* 2.13* 1.74*  CALCIUM 8.6* 8.0* 8.0* 8.1*   Liver Function Tests: Recent Labs  Lab 10/08/20 0833  AST 26  ALT 24  ALKPHOS 78  BILITOT 0.9  PROT 7.1  ALBUMIN 3.2*   No results for input(s): LIPASE, AMYLASE in the last 168 hours. No results for input(s): AMMONIA in the last 168 hours. CBC: Recent Labs  Lab 10/08/20 0833 10/09/20 0241  WBC 10.6* 9.7  NEUTROABS 8.0*  --   HGB 11.9* 11.4*  HCT 37.9* 34.3*  MCV 92.2 90.3  PLT 232  184   Cardiac Enzymes: No results for input(s): CKTOTAL, CKMB, CKMBINDEX, TROPONINI in the last 168 hours. BNP: Invalid input(s): POCBNP CBG: Recent Labs  Lab 10/10/20 1119 10/10/20 1649 10/10/20 2153 10/11/20 0649 10/11/20 1136  GLUCAP 161* 119* 125* 136* 132*   D-Dimer No results for input(s): DDIMER in the last 72 hours. Hgb A1c No results for input(s): HGBA1C in  the last 72 hours. Lipid Profile No results for input(s): CHOL, HDL, LDLCALC, TRIG, CHOLHDL, LDLDIRECT in the last 72 hours. Thyroid function studies No results for input(s): TSH, T4TOTAL, T3FREE, THYROIDAB in the last 72 hours.  Invalid input(s): FREET3 Anemia work up No results for input(s): VITAMINB12, FOLATE, FERRITIN, TIBC, IRON, RETICCTPCT in the last 72 hours. Urinalysis    Component Value Date/Time   COLORURINE YELLOW 10/08/2020 0828   APPEARANCEUR CLOUDY (A) 10/08/2020 0828   LABSPEC 1.013 10/08/2020 0828   PHURINE 5.0 10/08/2020 0828   GLUCOSEU NEGATIVE 10/08/2020 0828   GLUCOSEU 100 (A) 06/16/2020 1159   HGBUR LARGE (A) 10/08/2020 0828   BILIRUBINUR NEGATIVE 10/08/2020 0828   KETONESUR NEGATIVE 10/08/2020 0828   PROTEINUR 30 (A) 10/08/2020 0828   UROBILINOGEN 0.2 06/16/2020 1159   NITRITE POSITIVE (A) 10/08/2020 0828   LEUKOCYTESUR LARGE (A) 10/08/2020 0828   Sepsis Labs Invalid input(s): PROCALCITONIN,  WBC,  LACTICIDVEN Microbiology Recent Results (from the past 240 hour(s))  Resp Panel by RT-PCR (Flu A&B, Covid) Nasopharyngeal Swab     Status: None   Collection Time: 10/08/20 10:33 AM   Specimen: Nasopharyngeal Swab; Nasopharyngeal(NP) swabs in vial transport medium  Result Value Ref Range Status   SARS Coronavirus 2 by RT PCR NEGATIVE NEGATIVE Final    Comment: (NOTE) SARS-CoV-2 target nucleic acids are NOT DETECTED.  The SARS-CoV-2 RNA is generally detectable in upper respiratory specimens during the acute phase of infection. The lowest concentration of SARS-CoV-2 viral copies  this assay can detect is 138 copies/mL. A negative result does not preclude SARS-Cov-2 infection and should not be used as the sole basis for treatment or other patient management decisions. A negative result may occur with  improper specimen collection/handling, submission of specimen other than nasopharyngeal swab, presence of viral mutation(s) within the areas targeted by this assay, and inadequate number of viral copies(<138 copies/mL). A negative result must be combined with clinical observations, patient history, and epidemiological information. The expected result is Negative.  Fact Sheet for Patients:  EntrepreneurPulse.com.au  Fact Sheet for Healthcare Providers:  IncredibleEmployment.be  This test is no t yet approved or cleared by the [place] FDA and  has been authorized for detection and/or diagnosis of SARS-CoV-2 by FDA under an Emergency Use Authorization (EUA). This EUA will remain  in effect (meaning this test can be used) for the duration of the COVID-19 declaration under Section 564(b)(1) of the Act, 21 U.S.C.section 360bbb-3(b)(1), unless the authorization is terminated  or revoked sooner.       Influenza A by PCR NEGATIVE NEGATIVE Final   Influenza B by PCR NEGATIVE NEGATIVE Final    Comment: (NOTE) The Xpert Xpress SARS-CoV-2/FLU/RSV plus assay is intended as an aid in the diagnosis of influenza from Nasopharyngeal swab specimens and should not be used as a sole basis for treatment. Nasal washings and aspirates are unacceptable for Xpert Xpress SARS-CoV-2/FLU/RSV testing.  Fact Sheet for Patients: EntrepreneurPulse.com.au  Fact Sheet for Healthcare Providers: IncredibleEmployment.be  This test is not yet approved or cleared by the [place] FDA and has been authorized for detection and/or diagnosis of SARS-CoV-2 by FDA under an Emergency Use Authorization (EUA). This EUA  will remain in effect (meaning this test can be used) for the duration of the COVID-19 declaration under Section 564(b)(1) of the Act, 21 U.S.C. section 360bbb-3(b)(1), unless the authorization is terminated or revoked.  Performed at [facility], [place] [address], [place], [place] [address]   Urine culture  Status: Abnormal   Collection Time: 10/08/20 10:54 AM   Specimen: Urine, Random  Result Value Ref Range Status   Specimen Description URINE, RANDOM  Final   Special Requests   Final    NONE Performed at [facility], [place] [address] [place], [place] [address]    Culture >=100,000 COLONIES/mL ENTEROBACTER AEROGENES (A)  Final   Report Status 10/10/2020 FINAL  Final   Organism ID, Bacteria ENTEROBACTER AEROGENES (A)  Final      Susceptibility   Enterobacter aerogenes - MIC*    CEFAZOLIN >=64 RESISTANT Resistant     CEFEPIME <=0.12 SENSITIVE Sensitive     CEFTRIAXONE <=0.25 SENSITIVE Sensitive     CIPROFLOXACIN <=0.25 SENSITIVE Sensitive     GENTAMICIN <=1 SENSITIVE Sensitive     IMIPENEM 1 SENSITIVE Sensitive     NITROFURANTOIN 64 INTERMEDIATE Intermediate     TRIMETH/SULFA <=20 SENSITIVE Sensitive     PIP/TAZO <=4 SENSITIVE Sensitive     * >=100,000 COLONIES/mL ENTEROBACTER AEROGENES     Time coordinating discharge: 40 minutes  SIGNED:   Elmarie Shiley, MD  [facility]

## 2020-10-12 ENCOUNTER — Telehealth: Payer: Self-pay

## 2020-10-12 NOTE — Telephone Encounter (Signed)
During TCM call patient had a question regarding his Coumadin. He is scheduled to have a cystoscopy on 10/19/20 and the urologist told him to check with his PCP about stopping the Coumadin. He wants to know if & when he needs to stop it prior to the procedure. He is coming into the office tomorrow for an INR.

## 2020-10-12 NOTE — Telephone Encounter (Signed)
Pts INR looks to have been sub-therapeutic while hospitalized recently. INR will be checked tomorrow. I am not able to find what was done previously as far as pts anticoagulation for prior urologic procedures. This may be a question best answered by urologist and review of records from prior procedures and what was done regarding pts anticoagulation. Pt may need lovenox bridge and therefore would stop coumadin 5 days prior, 3 days prior start lovenox daily, and then resume coumadin hopefully 24hrs post-procedure. But again I think this is best determined by urologist and what has been done for this patient in the past when having same/similar procedure.

## 2020-10-12 NOTE — Telephone Encounter (Signed)
Transition Care Management Follow-up Telephone Call  Date of discharge and from where: 10/11/20- [facility]  How have you been since you were released from the hospital? Good  Any questions or concerns? No  Items Reviewed:  Did the pt receive and understand the discharge instructions provided? Yes   Medications obtained and verified? Yes   Other? Yes   Any new allergies since your discharge? No   Dietary orders reviewed? Yes  Do you have support at home? Yes   Home Care and Equipment/Supplies: Were home health services ordered? no If so, what is the name of the agency? n/a  Has the agency set up a time to come to the patient's home? not applicable Were any new equipment or medical supplies ordered?  No What is the name of the medical supply agency? n/a Were you able to get the supplies/equipment? not applicable Do you have any questions related to the use of the equipment or supplies? No  Functional Questionnaire: (I = Independent and D = Dependent) ADLs: I  Bathing/Dressing- I  Meal Prep- I  Eating- I  Maintaining continence- I  Transferring/Ambulation- I  Managing Meds- I  Follow up appointments reviewed:   PCP Hospital f/u appt confirmed? Yes  Scheduled to see Dutch Quint on 10/17/20 @ 2:40.  [facility] f/u appt confirmed? Yes  [facility]. 10/19/20  Are transportation arrangements needed? No   If their condition worsens, is the pt aware to call PCP or go to the Emergency Dept.? Yes  Was the patient provided with contact information for the PCP's office or ED? Yes  Was to pt encouraged to call back with questions or concerns? Yes

## 2020-10-13 ENCOUNTER — Other Ambulatory Visit (HOSPITAL_COMMUNITY): Payer: Self-pay | Admitting: *Deleted

## 2020-10-13 ENCOUNTER — Other Ambulatory Visit: Payer: Self-pay

## 2020-10-13 ENCOUNTER — Other Ambulatory Visit: Payer: Self-pay | Admitting: Nurse Practitioner

## 2020-10-13 ENCOUNTER — Ambulatory Visit (INDEPENDENT_AMBULATORY_CARE_PROVIDER_SITE_OTHER): Payer: Medicare Other | Admitting: Nurse Practitioner

## 2020-10-13 VITALS — Wt 244.4 lb

## 2020-10-13 DIAGNOSIS — Z7901 Long term (current) use of anticoagulants: Secondary | ICD-10-CM

## 2020-10-13 LAB — POCT INR: INR: 1.8 — AB (ref 2.0–3.0)

## 2020-10-13 MED ORDER — ENOXAPARIN SODIUM 120 MG/0.8ML ~~LOC~~ SOLN: 120.0000 mg | Freq: Two times a day (BID) | SUBCUTANEOUS | 0 refills | Status: DC

## 2020-10-13 NOTE — Progress Notes (Signed)
Hold coumadin Advise to start Lovenox injection 120mg  BID. Stop Lovenox injection 12hrs prior to procedure. Resume coumadin at 5mg  daily and lovenox injection 120mg  daily the day after surgery. Schedule nurse visit for INR check 3days after surgery.

## 2020-10-13 NOTE — Progress Notes (Signed)
Pt here for INR check per  Goal INR = 2.0-3.0  Last INR = 1.2  Pt currently takes Coumadin   Date of last Coumadin dose = 10/13/20  Pt denies recent antibiotics, no dietary changes and no unusual bruising / bleeding.  INR today = 1.8  Pt advised per Baldo Ash Nche-NP

## 2020-10-13 NOTE — Telephone Encounter (Signed)
Patient notified and verbalized understanding

## 2020-10-13 NOTE — Telephone Encounter (Signed)
Addressed during nurse visit today. Call patient with lovenox instructions

## 2020-10-13 NOTE — Telephone Encounter (Signed)
Received call from Dr. Claudia Desanctis with [facility] stating that she would advise pt to call [facility] on Monday for advice. She doesn't usually have pts stop coumadin for the procedure he is having. I called pt to advise and he said a nurse from [facility] called him earlier today and he was told to stop warfarin tomorrow and they sent in another medication for him today. Pt states it is all taken care of.

## 2020-10-16 ENCOUNTER — Other Ambulatory Visit: Payer: Self-pay

## 2020-10-16 ENCOUNTER — Other Ambulatory Visit (HOSPITAL_COMMUNITY)
Admit: 2020-10-16 | Discharge: 2020-10-16 | Disposition: A | Discharge status: Home / Self Care | Payer: Medicare Other | Attending: Urology | Admitting: Urology

## 2020-10-16 ENCOUNTER — Encounter (HOSPITAL_COMMUNITY): Payer: Self-pay

## 2020-10-16 ENCOUNTER — Encounter (HOSPITAL_COMMUNITY)
Admit: 2020-10-16 | Discharge: 2020-10-16 | Disposition: A | Discharge status: Home / Self Care | Payer: Medicare Other | Attending: Urology | Admitting: Urology

## 2020-10-16 DIAGNOSIS — Z01812 Encounter for preprocedural laboratory examination: Secondary | ICD-10-CM | POA: Diagnosis not present

## 2020-10-16 DIAGNOSIS — Z20822 Contact with and (suspected) exposure to covid-19: Secondary | ICD-10-CM | POA: Insufficient documentation

## 2020-10-16 HISTORY — DX: Chronic kidney disease, unspecified: N18.9

## 2020-10-16 HISTORY — DX: Dyspnea, unspecified: R06.00

## 2020-10-16 LAB — CBC
HCT: 39.7 % (ref 39.0–52.0)
Hemoglobin: 12.6 g/dL — ABNORMAL LOW (ref 13.0–17.0)
MCH: 29.4 pg (ref 26.0–34.0)
MCHC: 31.7 g/dL (ref 30.0–36.0)
MCV: 92.5 fL (ref 80.0–100.0)
Platelets: 314 10*3/uL (ref 150–400)
RBC: 4.29 MIL/uL (ref 4.22–5.81)
RDW: 13.9 % (ref 11.5–15.5)
WBC: 10.3 10*3/uL (ref 4.0–10.5)
nRBC: 0 % (ref 0.0–0.2)

## 2020-10-16 LAB — BASIC METABOLIC PANEL
Anion gap: 11 (ref 5–15)
BUN: 21 mg/dL (ref 8–23)
CO2: 24 mmol/L (ref 22–32)
Calcium: 9.3 mg/dL (ref 8.9–10.3)
Chloride: 106 mmol/L (ref 98–111)
Creatinine, Ser: 1.64 mg/dL — ABNORMAL HIGH (ref 0.61–1.24)
GFR, Estimated: 46 mL/min — ABNORMAL LOW (ref >60)
Glucose, Bld: 113 mg/dL — ABNORMAL HIGH (ref 70–99)
Potassium: 5.1 mmol/L (ref 3.5–5.1)
Sodium: 141 mmol/L (ref 135–145)

## 2020-10-16 LAB — GLUCOSE, CAPILLARY: Glucose-Capillary: 116 mg/dL — ABNORMAL HIGH (ref 70–99)

## 2020-10-16 NOTE — Patient Instructions (Addendum)
DUE TO COVID-19 ONLY ONE VISITOR IS ALLOWED TO COME WITH YOU AND STAY IN THE WAITING ROOM ONLY DURING PRE OP AND PROCEDURE DAY OF SURGERY. THE 1 VISITOR  MAY VISIT WITH YOU AFTER SURGERY IN YOUR PRIVATE ROOM DURING VISITING HOURS ONLY!  YOU NEED TO HAVE A COVID 19 TEST ON: 10/16/20, THIS TEST MUST BE DONE BEFORE SURGERY,  COVID TESTING SITE [address] [place] [address], IT IS ON THE RIGHT GOING OUT [address] APPROXIMATELY  2 MINUTES PAST [employer] ON THE RIGHT. ONCE YOUR COVID TEST IS COMPLETED,  PLEASE BEGIN THE QUARANTINE INSTRUCTIONS AS OUTLINED IN YOUR HANDOUT.                [name]    Your procedure is scheduled on: 10/19/20   Report to [facility] Main  Entrance   Report to admitting at: 9:20 AM     Call this number if you have problems the morning of surgery [phone]    Remember: Do not eat solid food :After Midnight. Clear liquids until: 8:20 am.  CLEAR LIQUID DIET   Foods Allowed                                                                     Foods Excluded  Coffee and tea, regular and decaf                             liquids that you cannot  Plain Jell-O any favor except red or purple                                           see through such as: Fruit ices (not with fruit pulp)                                     milk, soups, orange juice  Iced Popsicles                                    All solid food Carbonated beverages, regular and diet                                    Cranberry, grape and apple juices Sports drinks like Gatorade Lightly seasoned clear broth or consume(fat free) Sugar, honey syrup  Sample Menu Breakfast                                Lunch                                     Supper Cranberry juice                    Beef  broth                            Chicken broth Jell-O                                     Grape juice                           Apple juice Coffee or tea                         Jell-O                                      Popsicle                                                Coffee or tea                        Coffee or tea  _____________________________________________________________________  BRUSH YOUR TEETH MORNING OF SURGERY AND RINSE YOUR MOUTH OUT, NO CHEWING GUM CANDY OR MINTS.    Take these medicines the morning of surgery with A SIP OF WATER: amlodipine,florastor,cefdinir,gabapentin as needed.  How to Manage Your Diabetes Before and After Surgery  Why is it important to control my blood sugar before and after surgery? . Improving blood sugar levels before and after surgery helps healing and can limit problems. . A way of improving blood sugar control is eating a healthy diet by: o  Eating less sugar and carbohydrates o  Increasing activity/exercise o  Talking with your doctor about reaching your blood sugar goals . High blood sugars (greater than 180 mg/dL) can raise your risk of infections and slow your recovery, so you will need to focus on controlling your diabetes during the weeks before surgery. . Make sure that the doctor who takes care of your diabetes knows about your planned surgery including the date and location.  How do I manage my blood sugar before surgery? . Check your blood sugar at least 4 times a day, starting 2 days before surgery, to make sure that the level is not too high or low. o Check your blood sugar the morning of your surgery when you wake up and every 2 hours until you get to the Short Stay unit. . If your blood sugar is less than 70 mg/dL, you will need to treat for low blood sugar: o Do not take insulin. o Treat a low blood sugar (less than 70 mg/dL) with  cup of clear juice (cranberry or apple), 4 glucose tablets, OR glucose gel. o Recheck blood sugar in 15 minutes after treatment (to make sure it is greater than 70 mg/dL). If your blood sugar is not greater than 70 mg/dL on recheck, call [phone] for further  instructions. . Report your blood sugar to the short stay nurse when you get to Short Stay.  . If you are admitted to the hospital after surgery: o Your blood sugar will be checked by the staff and you will probably be given  insulin after surgery (instead of oral diabetes medicines) to make sure you have good blood sugar levels. o The goal for blood sugar control after surgery is 80-180 mg/dL.   WHAT DO I DO ABOUT MY DIABETES MEDICATION?  [name] Do not take oral diabetes medicines (pills) the morning of surgery.  . THE DAY BEFORE SURGERY, take NPH insulin as usual. Take ONLY half of the NPH insulin night dose.       . THE MORNING OF SURGERY, DO NOT take NPH insulin.  DO NOT TAKE ANY DIABETIC MEDICATIONS DAY OF YOUR SURGERY                               You may not have any metal on your body including hair pins and              piercings  Do not wear jewelry,lotions, powders or perfumes, deodorant             Men may shave face and neck.   Do not bring valuables to the hospital. [place].  Contacts, dentures or bridgework may not be worn into surgery.  Leave suitcase in the car. After surgery it may be brought to your room.     Patients discharged the day of surgery will not be allowed to drive home. IF YOU ARE HAVING SURGERY AND GOING HOME THE SAME DAY, YOU MUST HAVE AN ADULT TO DRIVE YOU HOME AND BE WITH YOU FOR 24 HOURS. YOU MAY GO HOME BY TAXI OR UBER OR ORTHERWISE, BUT AN ADULT MUST ACCOMPANY YOU HOME AND STAY WITH YOU FOR 24 HOURS.  Name and phone number of your driver:  Special Instructions: N/A              Please read over the following fact sheets you were given: _____________________________________________________________________         [facility] - Preparing for Surgery Before surgery, you can play an important role.  Because skin is not sterile, your skin needs to be as free of germs as possible.  You can reduce the  number of germs on your skin by washing with CHG (chlorahexidine gluconate) soap before surgery.  CHG is an antiseptic cleaner which kills germs and bonds with the skin to continue killing germs even after washing. Please DO NOT use if you have an allergy to CHG or antibacterial soaps.  If your skin becomes reddened/irritated stop using the CHG and inform your nurse when you arrive at Short Stay. Do not shave (including legs and underarms) for at least 48 hours prior to the first CHG shower.  You may shave your face/neck. Please follow these instructions carefully:  1.  Shower with CHG Soap the night before surgery and the  morning of Surgery.  2.  If you choose to wash your hair, wash your hair first as usual with your  normal  shampoo.  3.  After you shampoo, rinse your hair and body thoroughly to remove the  shampoo.                           4.  Use CHG as you would any other liquid soap.  You can apply chg directly  to the skin and wash  Gently with a scrungie or clean washcloth.  5.  Apply the CHG Soap to your body ONLY FROM THE NECK DOWN.   Do not use on face/ open                           Wound or open sores. Avoid contact with eyes, ears mouth and genitals (private parts).                       Wash face,  Genitals (private parts) with your normal soap.             6.  Wash thoroughly, paying special attention to the area where your surgery  will be performed.  7.  Thoroughly rinse your body with warm water from the neck down.  8.  DO NOT shower/wash with your normal soap after using and rinsing off  the CHG Soap.                9.  Pat yourself dry with a clean towel.            10.  Wear clean pajamas.            11.  Place clean sheets on your bed the night of your first shower and do not  sleep with pets. Day of Surgery : Do not apply any lotions/deodorants the morning of surgery.  Please wear clean clothes to the hospital/surgery center.  FAILURE TO FOLLOW  THESE INSTRUCTIONS MAY RESULT IN THE CANCELLATION OF YOUR SURGERY PATIENT SIGNATURE_________________________________  NURSE SIGNATURE__________________________________  ________________________________________________________________________

## 2020-10-16 NOTE — Progress Notes (Addendum)
COVID Vaccine Completed: Yes Date COVID Vaccine completed:08/2020. [place] COVID vaccine manufacturer: Pfizer PCP - Dr. Jon Billings. Cardiologist - Dr. Cherlynn Kaiser.  Chest x-ray -  EKG - 06/07/20 Stress Test -  ECHO -  Cardiac Cath -  Pacemaker/ICD device last checked: A1-C: 7.2: 09/22/20 Sleep Study -  CPAP -   Fasting Blood Sugar -  Checks Blood Sugar _____ times a day  Blood Thinner Instructions:Coumadin is on hold and was bridged to Lovenox. RN advised pt. To call MD. And get specific instructions when he should hold Lovenox. Aspirin Instructions: On hold as per cardiologist. Last Dose:  Anesthesia review: Hx: DIA,HTN,OSA (CPAP),PE.  Patient denies shortness of breath, fever, cough and chest pain at PAT appointment   Patient verbalized understanding of instructions that were given to them at the PAT appointment. Patient was also instructed that they will need to review over the PAT instructions again at home before surgery.

## 2020-10-17 ENCOUNTER — Ambulatory Visit: Payer: Medicare Other

## 2020-10-17 ENCOUNTER — Encounter: Payer: Self-pay | Admitting: Family

## 2020-10-17 ENCOUNTER — Ambulatory Visit (INDEPENDENT_AMBULATORY_CARE_PROVIDER_SITE_OTHER): Payer: Medicare Other | Admitting: Family

## 2020-10-17 VITALS — BP 124/70 | HR 83 | Temp 97.8°F | Ht 72.0 in | Wt 245.2 lb

## 2020-10-17 DIAGNOSIS — Z794 Long term (current) use of insulin: Secondary | ICD-10-CM

## 2020-10-17 DIAGNOSIS — N390 Urinary tract infection, site not specified: Secondary | ICD-10-CM

## 2020-10-17 DIAGNOSIS — I10 Essential (primary) hypertension: Secondary | ICD-10-CM | POA: Diagnosis not present

## 2020-10-17 DIAGNOSIS — N201 Calculus of ureter: Secondary | ICD-10-CM

## 2020-10-17 DIAGNOSIS — E1165 Type 2 diabetes mellitus with hyperglycemia: Secondary | ICD-10-CM | POA: Diagnosis not present

## 2020-10-17 DIAGNOSIS — Z7901 Long term (current) use of anticoagulants: Secondary | ICD-10-CM | POA: Diagnosis not present

## 2020-10-17 LAB — SARS CORONAVIRUS 2 (TAT 6-24 HRS): SARS Coronavirus 2: NEGATIVE

## 2020-10-17 NOTE — Patient Instructions (Signed)
Rivaroxaban oral tablets What is this medicine? RIVAROXABAN (ri va ROX a ban) is an anticoagulant (blood thinner). It is used to treat blood clots in the lungs or in the veins. It is also used to prevent blood clots in the lungs or in the veins. It is also used to lower the chance of stroke in people with a medical condition called atrial fibrillation. This medicine may be used for other purposes; ask your health care provider or pharmacist if you have questions. COMMON BRAND NAME(S): Xarelto, Xarelto Starter Pack What should I tell my health care provider before I take this medicine? They need to know if you have any of these conditions:  antiphospholipid antibody syndrome  artificial heart valve  bleeding disorders  bleeding in the brain  blood in your stools (black or tarry stools) or if you have blood in your vomit  history of blood clots  history of stomach bleeding  kidney disease  liver disease  low blood counts, like low white cell, platelet, or red cell counts  recent or planned spinal or epidural procedure  take medicines that treat or prevent blood clots  an unusual or allergic reaction to rivaroxaban, other medicines, foods, dyes, or preservatives  pregnant or trying to get pregnant  breast-feeding How should I use this medicine? Take this medicine by mouth with a glass of water. Follow the directions on the prescription label. Take your medicine at regular intervals. Do not take it more often than directed. Do not stop taking except on your doctor's advice. Stopping this medicine may increase your risk of a blood clot. Be sure to refill your prescription before you run out of medicine. If you are taking this medicine after hip or knee replacement surgery, take it with or without food. If you are taking this medicine for atrial fibrillation, take it with your evening meal. If you are taking this medicine to treat blood clots, take it with food at the same time each  day. If you are unable to swallow your tablet, you may crush the tablet and mix it in applesauce. Then, immediately eat the applesauce. You should eat more food right after you eat the applesauce containing the crushed tablet. Talk to your pediatrician regarding the use of this medicine in children. Special care may be needed. Overdosage: If you think you have taken too much of this medicine contact a poison control center or emergency room at once. NOTE: This medicine is only for you. Do not share this medicine with others. What if I miss a dose? If you take your medicine once a day and miss a dose, take the missed dose as soon as you remember. If it is almost time for your next dose, take only that dose. Do not take double or extra doses. If you take your medicine twice a day and miss a dose, take the missed dose immediately. In this instance, 2 tablets may be taken at the same time. The next day you should take 1 tablet twice a day as directed. What may interact with this medicine? Do not take this medicine with any of the following medications:  defibrotide This medicine may also interact with the following medications:  aspirin and aspirin-like medicines  certain antibiotics like erythromycin, azithromycin, and clarithromycin  certain medicines for fungal infections like ketoconazole and itraconazole  certain medicines for irregular heart beat like amiodarone, quinidine, dronedarone  certain medicines for seizures like carbamazepine, phenytoin  certain medicines that treat or prevent blood clots  like warfarin, enoxaparin, and dalteparin  conivaptan  felodipine  indinavir  lopinavir; ritonavir  NSAIDS, medicines for pain and inflammation, like ibuprofen or naproxen  ranolazine  rifampin  ritonavir  SNRIs, medicines for depression, like desvenlafaxine, duloxetine, levomilnacipran, venlafaxine  SSRIs, medicines for depression, like citalopram, escitalopram, fluoxetine,  fluvoxamine, paroxetine, sertraline  St. John's wort  verapamil This list may not describe all possible interactions. Give your health care provider a list of all the medicines, herbs, non-prescription drugs, or dietary supplements you use. Also tell them if you smoke, drink alcohol, or use illegal drugs. Some items may interact with your medicine. What should I watch for while using this medicine? Visit your healthcare professional for regular checks on your progress. You may need blood work done while you are taking this medicine. Your condition will be monitored carefully while you are receiving this medicine. It is important not to miss any appointments. Avoid sports and activities that might cause injury while you are using this medicine. Severe falls or injuries can cause unseen bleeding. Be careful when using sharp tools or knives. Consider using an Copy. Take special care brushing or flossing your teeth. Report any injuries, bruising, or red spots on the skin to your healthcare professional. If you are going to need surgery or other procedure, tell your healthcare professional that you are taking this medicine. Wear a medical ID bracelet or chain. Carry a card that describes your disease and details of your medicine and dosage times. What side effects may I notice from receiving this medicine? Side effects that you should report to your doctor or health care professional as soon as possible:  allergic reactions like skin rash, itching or hives, swelling of the face, lips, or tongue  back pain  redness, blistering, peeling or loosening of the skin, including inside the mouth  signs and symptoms of bleeding such as bloody or black, tarry stools; red or dark-brown urine; spitting up blood or brown material that looks like coffee grounds; red spots on the skin; unusual bruising or bleeding from the eye, gums, or nose  signs and symptoms of a blood clot such as chest pain;  shortness of breath; pain, swelling, or warmth in the leg  signs and symptoms of a stroke such as changes in vision; confusion; trouble speaking or understanding; severe headaches; sudden numbness or weakness of the face, arm or leg; trouble walking; dizziness; loss of coordination Side effects that usually do not require medical attention (report to your doctor or health care professional if they continue or are bothersome):  dizziness  muscle pain This list may not describe all possible side effects. Call your doctor for medical advice about side effects. You may report side effects to FDA at 1-800-FDA-1088. Where should I keep my medicine? Keep out of the reach of children. Store at room temperature between 15 and 30 degrees C (59 and 86 degrees F). Throw away any unused medicine after the expiration date. NOTE: This sheet is a summary. It may not cover all possible information. If you have questions about this medicine, talk to your doctor, pharmacist, or health care provider.  2020 Elsevier/Gold Standard (2019-01-18 09:45:59)   Apixaban oral tablets What is this medicine? APIXABAN (a PIX a ban) is an anticoagulant (blood thinner). It is used to lower the chance of stroke in people with a medical condition called atrial fibrillation. It is also used to treat or prevent blood clots in the lungs or in the veins. This medicine may  be used for other purposes; ask your health care provider or pharmacist if you have questions. COMMON BRAND NAME(S): Eliquis What should I tell my health care provider before I take this medicine? They need to know if you have any of these conditions:  antiphospholipid antibody syndrome  bleeding disorders  bleeding in the brain  blood in your stools (black or tarry stools) or if you have blood in your vomit  history of blood clots  history of stomach bleeding  kidney disease  liver disease  mechanical heart valve  an unusual or allergic reaction  to apixaban, other medicines, foods, dyes, or preservatives  pregnant or trying to get pregnant  breast-feeding How should I use this medicine? Take this medicine by mouth with a glass of water. Follow the directions on the prescription label. You can take it with or without food. If it upsets your stomach, take it with food. Take your medicine at regular intervals. Do not take it more often than directed. Do not stop taking except on your doctor's advice. Stopping this medicine may increase your risk of a blood clot. Be sure to refill your prescription before you run out of medicine. Talk to your pediatrician regarding the use of this medicine in children. Special care may be needed. Overdosage: If you think you have taken too much of this medicine contact a poison control center or emergency room at once. NOTE: This medicine is only for you. Do not share this medicine with others. What if I miss a dose? If you miss a dose, take it as soon as you can. If it is almost time for your next dose, take only that dose. Do not take double or extra doses. What may interact with this medicine? This medicine may interact with the following:  aspirin and aspirin-like medicines  certain medicines for fungal infections like ketoconazole and itraconazole  certain medicines for seizures like carbamazepine and phenytoin  certain medicines that treat or prevent blood clots like warfarin, enoxaparin, and dalteparin  clarithromycin  NSAIDs, medicines for pain and inflammation, like ibuprofen or naproxen  rifampin  ritonavir  St. John's wort This list may not describe all possible interactions. Give your health care provider a list of all the medicines, herbs, non-prescription drugs, or dietary supplements you use. Also tell them if you smoke, drink alcohol, or use illegal drugs. Some items may interact with your medicine. What should I watch for while using this medicine? Visit your healthcare  professional for regular checks on your progress. You may need blood work done while you are taking this medicine. Your condition will be monitored carefully while you are receiving this medicine. It is important not to miss any appointments. Avoid sports and activities that might cause injury while you are using this medicine. Severe falls or injuries can cause unseen bleeding. Be careful when using sharp tools or knives. Consider using an Copy. Take special care brushing or flossing your teeth. Report any injuries, bruising, or red spots on the skin to your healthcare professional. If you are going to need surgery or other procedure, tell your healthcare professional that you are taking this medicine. Wear a medical ID bracelet or chain. Carry a card that describes your disease and details of your medicine and dosage times. What side effects may I notice from receiving this medicine? Side effects that you should report to your doctor or health care professional as soon as possible:  allergic reactions like skin rash, itching or hives, swelling  of the face, lips, or tongue  signs and symptoms of bleeding such as bloody or black, tarry stools; red or dark-brown urine; spitting up blood or brown material that looks like coffee grounds; red spots on the skin; unusual bruising or bleeding from the eye, gums, or nose  signs and symptoms of a blood clot such as chest pain; shortness of breath; pain, swelling, or warmth in the leg  signs and symptoms of a stroke such as changes in vision; confusion; trouble speaking or understanding; severe headaches; sudden numbness or weakness of the face, arm or leg; trouble walking; dizziness; loss of coordination This list may not describe all possible side effects. Call your doctor for medical advice about side effects. You may report side effects to FDA at 1-800-FDA-1088. Where should I keep my medicine? Keep out of the reach of children. Store at room  temperature between 20 and 25 degrees C (68 and 77 degrees F). Throw away any unused medicine after the expiration date. NOTE: This sheet is a summary. It may not cover all possible information. If you have questions about this medicine, talk to your doctor, pharmacist, or health care provider.  2020 Elsevier/Gold Standard (2018-07-01 17:39:34)

## 2020-10-17 NOTE — Progress Notes (Signed)
Established Patient Office Visit  Subjective:  Patient ID: [name], male    DOB: February 18, 1956  Age: 64 y.o. MRN: [id number]  CC:  Chief Complaint  Patient presents with  . Follow-up    Hospital follow up, no concerns   w HPI [name] presents  For a hospital follow-up. He was seen in the ER with large renal stones and UTI. He has since been under the care or urology and had stents placed. He has a 2nd procedure on 12/16 to try to address the existing stones. He is off Coumadin and on Lovenox in preparation for the procedure. Has creatinine rechecked and it has returned to his baseline. Denies any concerns today/   Past Medical History:  Diagnosis Date  . Arthritis   . Chronic kidney disease    reduced kidney function. Stage III  . Diabetes mellitus without complication ([place])   . Dyspnea    On exertion  . History of kidney stones   . History of pulmonary embolus (PE)   . Hypertension   . Lipidemia   . Prostate enlargement     Past Surgical History:  Procedure Laterality Date  . CYSTOSCOPY W/ URETERAL STENT PLACEMENT Left 10/08/2020   Procedure: CYSTOSCOPY WITH RETROGRADE PYELOGRAM/URETERAL STENT PLACEMENT;  Surgeon: Irine Seal, MD;  Location: [facility];  Service: Urology;  Laterality: Left;  . CYSTOSCOPY/URETEROSCOPY/HOLMIUM LASER/STENT PLACEMENT Right 06/07/2020   Procedure: CYSTOSCOPY/URETEROSCOPY/HOLMIUM LASER/STENT PLACEMENT;  Surgeon: Lucas Mallow, MD;  Location: [facility];  Service: Urology;  Laterality: Right;    Family History  Problem Relation Age of Onset  . Cancer Mother   . Heart disease Father     Social History   Socioeconomic History  . Marital status: Significant Other    Spouse name: Not on file  . Number of children: Not on file  . Years of education: Not on file  . Highest education level: Not on file  Occupational History  . Occupation: retired  Tobacco Use  . Smoking status: Never Smoker  . Smokeless tobacco: Never Used  Vaping Use  .  Vaping Use: Never used  Substance and Sexual Activity  . Alcohol use: Yes    Alcohol/week: 1.0 standard drink    Types: 1 Cans of beer per week    Comment: social  . Drug use: Never  . Sexual activity: Yes  Other Topics Concern  . Not on file  Social History Narrative  . Not on file   Social Determinants of Health   Financial Resource Strain: Not on file  Food Insecurity: Not on file  Transportation Needs: Not on file  Physical Activity: Not on file  Stress: Not on file  Social Connections: Not on file  Intimate Partner Violence: Not on file    Outpatient Medications Prior to Visit  Medication Sig Dispense Refill  . Accu-Chek Softclix Lancets lancets Use as instructed to check blood sugar 3 times daily Dx is E11.65 100 each 2  . acetaminophen (TYLENOL) 650 MG CR tablet Take 650 mg by mouth in the morning and at bedtime.    [name] amLODipine (NORVASC) 10 MG tablet Take 1 tablet (10 mg total) by mouth daily. 90 tablet 3  . aspirin EC 81 MG tablet Take 81 mg by mouth daily.    . Blood Glucose Monitoring Suppl (ACCU-CHEK AVIVA) device by Other route. Use as instructed    . cefdinir (OMNICEF) 300 MG capsule Take 300 mg by mouth 2 (two) times daily.    [name]  cilostazol (PLETAL) 100 MG tablet TAKE 1 TABLET BY MOUTH TWICE A DAY (Patient taking differently: Take 100 mg by mouth 2 (two) times daily.) 180 tablet 1  . enoxaparin (LOVENOX) 120 MG/0.8ML injection Inject 0.8 mLs (120 mg total) into the skin every 12 (twelve) hours. 7.2 mL 0  . gabapentin (NEURONTIN) 300 MG capsule Take 300 mg by mouth 3 (three) times daily as needed (pain).     [name] glucose blood (ACCU-CHEK GUIDE) test strip Use to check blood sugar 3 times daily.E11.65 100 each 12  . insulin NPH-regular Human (70-30) 100 UNIT/ML injection Inject 20 Units into the skin in the morning and at bedtime. 10 mL 11  . Insulin Syringes, Disposable, U-100 1 ML MISC 1 Device by Does not apply route as directed. 100 each 11  . lovastatin (MEVACOR)  40 MG tablet Take 1 tablet (40 mg total) by mouth at bedtime. 90 tablet 2  . montelukast (SINGULAIR) 10 MG tablet TAKE 1 TABLET BY MOUTH EVERY DAY (Patient taking differently: Take 10 mg by mouth at bedtime.) 90 tablet 1  . saccharomyces boulardii (FLORASTOR) 250 MG capsule Take 1 capsule (250 mg total) by mouth 2 (two) times daily. 20 capsule 0  . sodium bicarbonate 650 MG tablet Take 1 tablet (650 mg total) by mouth 2 (two) times daily. 30 tablet 0  . tamsulosin (FLOMAX) 0.4 MG CAPS capsule Take 1 capsule (0.4 mg total) by mouth daily. 30 capsule 0  . warfarin (COUMADIN) 5 MG tablet TAKE 1 TABLET (5 MG TOTAL) BY MOUTH DAILY. TAKE AS DIRECTED. (Patient taking differently: Take 5-7.5 mg by mouth daily. Take one 5 mg tablet by mouth daily except on Thursdays and Saturdays take7.5 mg tablet) 90 tablet 2   No facility-administered medications prior to visit.    No Known Allergies  ROS Review of Systems  Constitutional: Negative.   Respiratory: Negative.   Cardiovascular: Negative.   Gastrointestinal: Negative.   Genitourinary: Negative for difficulty urinating, flank pain and frequency.       Renal stones  Musculoskeletal: Negative.   Allergic/Immunologic: Negative.   Neurological: Negative.   Psychiatric/Behavioral: Negative.   All other systems reviewed and are negative.     Objective:    Physical Exam Constitutional:      Appearance: Normal appearance. He is obese.  Cardiovascular:     Rate and Rhythm: Normal rate and regular rhythm.     Pulses: Normal pulses.     Heart sounds: Normal heart sounds.  Pulmonary:     Effort: Pulmonary effort is normal.     Breath sounds: Normal breath sounds.  Abdominal:     General: Bowel sounds are normal.     Palpations: Abdomen is soft.  Musculoskeletal:        General: Normal range of motion.     Cervical back: Normal range of motion.  Skin:    General: Skin is warm and dry.  Neurological:     General: No focal deficit present.      Mental Status: He is alert and oriented to person, place, and time.  Psychiatric:        Mood and Affect: Mood normal.     BP 124/70   Pulse 83   Temp 97.8 F (36.6 C) (Temporal)   Ht 6' (1.829 m)   Wt 245 lb 3.2 oz (111.2 kg)   SpO2 98%   BMI 33.26 kg/m  Wt Readings from Last 3 Encounters:  10/17/20 245 lb 3.2 oz (111.2 kg)  10/16/20 244 lb 8 oz (110.9 kg)  10/13/20 244 lb 6.4 oz (110.9 kg)     Health Maintenance Due  Topic Date Due  . Hepatitis C Screening  Never done  . TETANUS/TDAP  Never done  . COLONOSCOPY  Never done  . COVID-19 Vaccine (3 - Booster for Pfizer series) 07/31/2020    There are no preventive care reminders to display for this patient.  Lab Results  Component Value Date   TSH 1.344 02/09/2020   Lab Results  Component Value Date   WBC 10.3 10/16/2020   HGB 12.6 (L) 10/16/2020   HCT 39.7 10/16/2020   MCV 92.5 10/16/2020   PLT 314 10/16/2020   Lab Results  Component Value Date   NA 141 10/16/2020   K 5.1 10/16/2020   CO2 24 10/16/2020   GLUCOSE 113 (H) 10/16/2020   BUN 21 10/16/2020   CREATININE 1.64 (H) 10/16/2020   BILITOT 0.9 10/08/2020   ALKPHOS 78 10/08/2020   AST 26 10/08/2020   ALT 24 10/08/2020   PROT 7.1 10/08/2020   ALBUMIN 3.2 (L) 10/08/2020   CALCIUM 9.3 10/16/2020   ANIONGAP 11 10/16/2020   GFR 35.92 (L) 08/28/2020   Lab Results  Component Value Date   CHOL 156 12/22/2019   Lab Results  Component Value Date   HDL 41.40 12/22/2019   No results found for: [facility] Lab Results  Component Value Date   TRIG 251.0 (H) 12/22/2019   Lab Results  Component Value Date   CHOLHDL 4 12/22/2019   Lab Results  Component Value Date   HGBA1C 7.2 (A) 09/22/2020      Assessment & Plan:   Problem List Items Addressed This Visit    Ureterolithiasis - Primary   Type 2 diabetes mellitus with hyperglycemia, with long-term current use of insulin ([place])   Long term (current) use of anticoagulants   Essential hypertension    Acute UTI      No orders of the defined types were placed in this encounter.   Follow-up: Return in about 4 weeks (around 11/14/2020). Patient wishes to discuss the possibility of Eliquis or Xarelto with Dr. Ethelene Hal. Information provided today.   Kennyth Arnold, FNP

## 2020-10-19 ENCOUNTER — Ambulatory Visit (HOSPITAL_COMMUNITY): Payer: Medicare Other

## 2020-10-19 ENCOUNTER — Ambulatory Visit (HOSPITAL_COMMUNITY): Payer: Medicare Other | Admitting: Physician Assistant

## 2020-10-19 ENCOUNTER — Encounter (HOSPITAL_COMMUNITY): Payer: Self-pay | Admitting: Urology

## 2020-10-19 ENCOUNTER — Encounter (HOSPITAL_COMMUNITY)
Admission: RE | Disposition: A | Discharge status: Home / Self Care | Payer: Self-pay | Source: Home / Self Care | Attending: Urology

## 2020-10-19 ENCOUNTER — Ambulatory Visit (HOSPITAL_COMMUNITY): Payer: Medicare Other | Admitting: Certified Registered"

## 2020-10-19 ENCOUNTER — Ambulatory Visit (HOSPITAL_COMMUNITY)
Admission: RE | Admit: 2020-10-19 | Discharge: 2020-10-19 | Disposition: A | Discharge status: Home / Self Care | Payer: Medicare Other | Attending: Urology | Admitting: Urology

## 2020-10-19 DIAGNOSIS — Z79899 Other long term (current) drug therapy: Secondary | ICD-10-CM | POA: Insufficient documentation

## 2020-10-19 DIAGNOSIS — Z794 Long term (current) use of insulin: Secondary | ICD-10-CM | POA: Diagnosis not present

## 2020-10-19 DIAGNOSIS — N202 Calculus of kidney with calculus of ureter: Secondary | ICD-10-CM | POA: Diagnosis present

## 2020-10-19 DIAGNOSIS — Z7982 Long term (current) use of aspirin: Secondary | ICD-10-CM | POA: Diagnosis not present

## 2020-10-19 DIAGNOSIS — N132 Hydronephrosis with renal and ureteral calculous obstruction: Secondary | ICD-10-CM | POA: Insufficient documentation

## 2020-10-19 HISTORY — PX: CYSTOSCOPY/URETEROSCOPY/HOLMIUM LASER/STENT PLACEMENT: SHX6546

## 2020-10-19 LAB — GLUCOSE, CAPILLARY
Glucose-Capillary: 135 mg/dL — ABNORMAL HIGH (ref 70–99)
Glucose-Capillary: 144 mg/dL — ABNORMAL HIGH (ref 70–99)

## 2020-10-19 SURGERY — CYSTOSCOPY/URETEROSCOPY/HOLMIUM LASER/STENT PLACEMENT
Anesthesia: General | Laterality: Left

## 2020-10-19 MED ORDER — ACETAMINOPHEN 10 MG/ML IV SOLN: 1000.0000 mg | Freq: Once | INTRAVENOUS | Status: DC | PRN

## 2020-10-19 MED ORDER — SODIUM CHLORIDE 0.9 % IV SOLN
2.0000 g | INTRAVENOUS | Status: AC
  Administered 2020-10-19: 11:00:00 2 g via INTRAVENOUS
  Filled 2020-10-19: qty 20

## 2020-10-19 MED ORDER — MIDAZOLAM HCL 2 MG/2ML IJ SOLN
INTRAMUSCULAR | Status: DC | PRN
  Administered 2020-10-19: 2 mg via INTRAVENOUS

## 2020-10-19 MED ORDER — SODIUM CHLORIDE 0.9 % IR SOLN
Status: DC | PRN
  Administered 2020-10-19: 3000 mL

## 2020-10-19 MED ORDER — PHENYLEPHRINE HCL-NACL 20-0.9 MG/250ML-% IV SOLN
INTRAVENOUS | Status: DC | PRN
  Administered 2020-10-19: 25 ug/min via INTRAVENOUS

## 2020-10-19 MED ORDER — CHLORHEXIDINE GLUCONATE 0.12 % MT SOLN
15.0000 mL | Freq: Once | OROMUCOSAL | Status: AC
  Administered 2020-10-19: 09:00:00 15 mL via OROMUCOSAL

## 2020-10-19 MED ORDER — AMISULPRIDE (ANTIEMETIC) 5 MG/2ML IV SOLN: 10.0000 mg | Freq: Once | INTRAVENOUS | Status: DC | PRN

## 2020-10-19 MED ORDER — PROPOFOL 10 MG/ML IV BOLUS
INTRAVENOUS | Status: AC
  Filled 2020-10-19: qty 20

## 2020-10-19 MED ORDER — LACTATED RINGERS IV SOLN: INTRAVENOUS | Status: DC

## 2020-10-19 MED ORDER — DEXAMETHASONE SODIUM PHOSPHATE 10 MG/ML IJ SOLN
INTRAMUSCULAR | Status: DC | PRN
  Administered 2020-10-19: 10 mg via INTRAVENOUS

## 2020-10-19 MED ORDER — MIDAZOLAM HCL 2 MG/2ML IJ SOLN
INTRAMUSCULAR | Status: AC
  Filled 2020-10-19: qty 2

## 2020-10-19 MED ORDER — FENTANYL CITRATE (PF) 250 MCG/5ML IJ SOLN
INTRAMUSCULAR | Status: AC
  Filled 2020-10-19: qty 5

## 2020-10-19 MED ORDER — SODIUM CHLORIDE 0.9% FLUSH: 3.0000 mL | Freq: Two times a day (BID) | INTRAVENOUS | Status: DC

## 2020-10-19 MED ORDER — PROPOFOL 10 MG/ML IV BOLUS
INTRAVENOUS | Status: DC | PRN
  Administered 2020-10-19: 200 mg via INTRAVENOUS

## 2020-10-19 MED ORDER — ORAL CARE MOUTH RINSE: 15.0000 mL | Freq: Once | OROMUCOSAL | Status: AC

## 2020-10-19 MED ORDER — HYDROCODONE-ACETAMINOPHEN 5-325 MG PO TABS: 1.0000 | ORAL_TABLET | Freq: Four times a day (QID) | ORAL | 0 refills | Status: DC | PRN

## 2020-10-19 MED ORDER — FENTANYL CITRATE (PF) 100 MCG/2ML IJ SOLN: 25.0000 ug | INTRAMUSCULAR | Status: DC | PRN

## 2020-10-19 MED ORDER — PHENYLEPHRINE 40 MCG/ML (10ML) SYRINGE FOR IV PUSH (FOR BLOOD PRESSURE SUPPORT)
PREFILLED_SYRINGE | INTRAVENOUS | Status: DC | PRN
  Administered 2020-10-19: 120 ug via INTRAVENOUS
  Administered 2020-10-19: 80 ug via INTRAVENOUS
  Administered 2020-10-19 (×2): 120 ug via INTRAVENOUS
  Administered 2020-10-19: 100 ug via INTRAVENOUS

## 2020-10-19 MED ORDER — LIDOCAINE HCL (CARDIAC) PF 100 MG/5ML IV SOSY
PREFILLED_SYRINGE | INTRAVENOUS | Status: DC | PRN
Start: 2020-10-19 — End: 2020-10-19
  Administered 2020-10-19: 100 mg via INTRATRACHEAL

## 2020-10-19 MED ORDER — FENTANYL CITRATE (PF) 100 MCG/2ML IJ SOLN
INTRAMUSCULAR | Status: DC | PRN
  Administered 2020-10-19: 50 ug via INTRAVENOUS

## 2020-10-19 MED ORDER — ONDANSETRON HCL 4 MG/2ML IJ SOLN
INTRAMUSCULAR | Status: DC | PRN
  Administered 2020-10-19: 4 mg via INTRAVENOUS

## 2020-10-19 SURGICAL SUPPLY — 23 items
BAG URO CATCHER STRL LF (MISCELLANEOUS) ×3 IMPLANT
BASKET STONE NCOMPASS (UROLOGICAL SUPPLIES) IMPLANT
CATH URET 5FR 28IN OPEN ENDED (CATHETERS) IMPLANT
CATH URET DUAL LUMEN 6-10FR 50 (CATHETERS) IMPLANT
CLOTH BEACON ORANGE TIMEOUT ST (SAFETY) ×3 IMPLANT
EXTRACTOR STONE NITINOL NGAGE (UROLOGICAL SUPPLIES) ×3 IMPLANT
GLOVE SURG SS PI 8.0 STRL IVOR (GLOVE) ×3 IMPLANT
GOWN STRL REUS W/TWL XL LVL3 (GOWN DISPOSABLE) ×3 IMPLANT
GUIDEWIRE STR DUAL SENSOR (WIRE) ×3 IMPLANT
IV NS IRRIG 3000ML ARTHROMATIC (IV SOLUTION) ×3 IMPLANT
KIT TURNOVER KIT A (KITS) IMPLANT
LASER FIB FLEXIVA PULSE ID 365 (Laser) IMPLANT
LASER FIB FLEXIVA PULSE ID 550 (Laser) IMPLANT
LASER FIB FLEXIVA PULSE ID 910 (Laser) IMPLANT
MANIFOLD NEPTUNE II (INSTRUMENTS) ×3 IMPLANT
PACK CYSTO (CUSTOM PROCEDURE TRAY) ×3 IMPLANT
SHEATH URETERAL 12FRX35CM (MISCELLANEOUS) ×3 IMPLANT
STENT URET 6FRX26 CONTOUR (STENTS) ×3 IMPLANT
TRACTIP FLEXIVA PULS ID 200XHI (Laser) ×1 IMPLANT
TRACTIP FLEXIVA PULSE ID 200 (Laser) ×2
TUBING CONNECTING 10 (TUBING) ×2 IMPLANT
TUBING CONNECTING 10' (TUBING) ×1
TUBING UROLOGY SET (TUBING) ×3 IMPLANT

## 2020-10-19 NOTE — Anesthesia Procedure Notes (Signed)
Procedure Name: Intubation Date/Time: 10/19/2020 11:28 AM Performed by: Pilar Grammes, CRNA Pre-anesthesia Checklist: Patient identified, Emergency Drugs available, Suction available, Patient being monitored and Timeout performed Patient Re-evaluated:Patient Re-evaluated prior to induction Oxygen Delivery Method: Circle system utilized Preoxygenation: Pre-oxygenation with 100% oxygen Induction Type: IV induction Ventilation: Mask ventilation without difficulty LMA: LMA inserted LMA Size: 5.0 Number of attempts: 1 Placement Confirmation: positive ETCO2,  ETT inserted through vocal cords under direct vision,  CO2 detector and breath sounds checked- equal and bilateral Tube secured with: Tape Dental Injury: Teeth and Oropharynx as per pre-operative assessment

## 2020-10-19 NOTE — Interval H&P Note (Signed)
History and Physical Interval Note:  He is doing well with the stent and remains on antibiotic coverage.   10/19/2020 10:49 AM  [name]  has presented today for surgery, with the diagnosis of left ureteral and renal stonesw.  The various methods of treatment have been discussed with the patient and family. After consideration of risks, benefits and other options for treatment, the patient has consented to  Procedure(s): CYSTOSCOPY LEFT URETEROSCOPY/HOLMIUM LASER/STENT EXCHANGE (Left) as a surgical intervention.  The patient's history has been reviewed, patient examined, no change in status, stable for surgery.  I have reviewed the patient's chart and labs.  Questions were answered to the patient's satisfaction.     Irine Seal

## 2020-10-19 NOTE — Discharge Instructions (Signed)
Ureteral Stent Implantation, Care After This sheet gives you information about how to care for yourself after your procedure. Your health care provider may also give you more specific instructions. If you have problems or questions, contact your health care provider. What can I expect after the procedure? After the procedure, it is common to have:  Nausea.  Mild pain when you urinate. You may feel this pain in your lower back or lower abdomen. The pain should stop within a few minutes after you urinate. This may last for up to 1 week.  A small amount of blood in your urine for several days. Follow these instructions at home: Medicines  Take over-the-counter and prescription medicines only as told by your health care provider.  If you were prescribed an antibiotic medicine, take it as told by your health care provider. Do not stop taking the antibiotic even if you start to feel better.  Do not drive for 24 hours if you were given a sedative during your procedure.  Ask your health care provider if the medicine prescribed to you requires you to avoid driving or using heavy machinery. Activity  Rest as told by your health care provider.  Avoid sitting for a long time without moving. Get up to take short walks every 1-2 hours. This is important to improve blood flow and breathing. Ask for help if you feel weak or unsteady.  Return to your normal activities as told by your health care provider. Ask your health care provider what activities are safe for you. General instructions   Watch for any blood in your urine. Call your health care provider if the amount of blood in your urine increases.  If you have a catheter: ? Follow instructions from your health care provider about taking care of your catheter and collection bag. ? Do not take baths, swim, or use a hot tub until your health care provider approves. Ask your health care provider if you may take showers. You may only be allowed to  take sponge baths.  Drink enough fluid to keep your urine pale yellow.  Do not use any products that contain nicotine or tobacco, such as cigarettes, e-cigarettes, and chewing tobacco. These can delay healing after surgery. If you need help quitting, ask your health care provider.  Keep all follow-up visits as told by your health care provider. This is important. Contact a health care provider if:  You have pain that gets worse or does not get better with medicine, especially pain when you urinate.  You have difficulty urinating.  You feel nauseous or you vomit repeatedly during a period of more than 2 days after the procedure. Get help right away if:  Your urine is dark red or has blood clots in it.  You are leaking urine (have incontinence).  The end of the stent comes out of your urethra.  You cannot urinate.  You have sudden, sharp, or severe pain in your abdomen or lower back.  You have a fever.  You have swelling or pain in your legs.  You have difficulty breathing. Summary  After the procedure, it is common to have mild pain when you urinate that goes away within a few minutes after you urinate. This may last for up to 1 week.  Watch for any blood in your urine. Call your health care provider if the amount of blood in your urine increases.  Take over-the-counter and prescription medicines only as told by your health care provider.  Drink   enough fluid to keep your urine pale yellow.  You may removed the stent by pulling the attacked string on Monday morning.  If you don't feel comfortable with that, please call the office to have it removed.   This information is not intended to replace advice given to you by your health care provider. Make sure you discuss any questions you have with your health care provider. Document Revised: 07/28/2018 Document Reviewed: 07/29/2018 Elsevier Patient Education  2020 [employer].

## 2020-10-19 NOTE — Anesthesia Postprocedure Evaluation (Signed)
Anesthesia Post Note  Patient: [name]  Procedure(s) Performed: CYSTOSCOPY LEFT URETEROSCOPY/HOLMIUM LASER/STENT EXCHANGE (Left )     Patient location during evaluation: PACU Anesthesia Type: General Level of consciousness: awake and alert Pain management: pain level controlled Vital Signs Assessment: post-procedure vital signs reviewed and stable Respiratory status: spontaneous breathing, nonlabored ventilation, respiratory function stable and patient connected to nasal cannula oxygen Cardiovascular status: blood pressure returned to baseline and stable Postop Assessment: no apparent nausea or vomiting Anesthetic complications: no   No complications documented.  Last Vitals:  Vitals:   10/19/20 1303 10/19/20 1309  BP:  132/78  Pulse: 74 76  Resp: 11 13  Temp: 36.4 C   SpO2: 94% 95%    Last Pain:  Vitals:   10/19/20 1303  TempSrc:   PainSc: 0-No pain                 Elijiah Mickley S

## 2020-10-19 NOTE — Op Note (Signed)
Procedure: 1.  Cystoscopy with removal of left double-J stent. 2.  Left ureteroscopic stone extraction of left distal ureteral stones. 3.  Left renal ureteroscopy with holmium laser application, stone extraction and insertion of left double-J stent. 4.  Application of fluoroscopy.  Preop diagnosis: Left ureteral and renal stones with a history of sepsis.  Postop diagnosis: Same.  Surgeon: Dr. Irine Seal.  Anesthesia: General.  Specimen: Stone fragments.  Drain: 6 [place] by 26 cm left contour double-J stent with tether.  EBL: None.  Complications: None.  Indications: The patient is a 64 year old male who was initially seen last week with sepsis and an obstructing left 2 to 3 mm ureteral stone and upper and lower pole renal stones.  He underwent stenting and returns now for definitive stone management.  Procedure: He was given his preoperative antibiotic.  A general anesthetic was induced.  He was placed in lithotomy position and fitted with PAS hose.  His perineum and genitalia were prepped with Betadine solution he was draped in usual sterile fashion.  Cystoscopy was performed using a 23 [place] scope and 30 degree lens.  Examination revealed a normal urethra.  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  Examination of bladder revealed mild trabeculation.  The right ureteral orifice was normal.  The left ureteral orifice had a stent with some peristent edema.  No tumors or stones were identified.  The left ureteral stent was grasped with a grasping forceps and pulled the urethral meatus and a sensor wire was advanced the kidney.  The stent was then removed.  The 6.5 French semirigid ureteroscope was then advanced per urethra alongside the wire into the distal ureter where 2 stones were identified that appeared to measure approximately 5 mm and 4 mm.  The stones were removed with the engage basket.  The ureteroscope was then advanced into the proximal ureter without  other ureteral stones being identified.  A 35 cm 12/14 French digital access sheath was passed over the wire into the proximal ureter and the inner core and wire were then removed.  The dual-lumen digital flexible scope was then advanced through the sheath.  Inspection revealed no additional stones in the proximal ureter or renal pelvis.  Stones were identified in the upper pole of the kidney and in the lower pole of the kidney approximately 6 to 8 mm in size.  A 200 m laser fiber was then passed through the scope in the upper pole stone was fragmented with the laser settings of 0.2 J and 70 Hz.  The stone was quite soft and fragmented easily.  The larger fragments were then removed with the engage basket.  Once only grit remained, the lower pole stone was addressed.  That stone was also fragmented on the dusting setting of 0.2 J and 70 Hz and was easily broken into manageable fragments which were then removed with the engage basket.  Once final inspection revealed no significant fragments, the ureteroscope was removed.  A sensor wire was then advanced through the sheath into the kidney under fluoroscopic guidance and the sheath was removed.  The cystoscope was reinserted over the wire and a 6 [place] by 26 cm contour double-J stent with tether was advanced in the left kidney under fluoroscopic guidance.  The wire was removed, leaving a good coil in the kidney and a good coil in the bladder.  The bladder was drained and the stent string was left exiting the urethra upon removal of the scope.  The string  was secured to the patient's penis.  He was taken down from lithotomy position, his anesthetic was reversed and he was moved to recovery in stable condition.  There were no complications.  A portion of the stone fragments will be sent for analysis and the remainder will be given to  the patient.

## 2020-10-19 NOTE — Anesthesia Preprocedure Evaluation (Addendum)
Anesthesia Evaluation  Patient identified by MRN, date of birth, ID band Patient awake    Reviewed: Allergy & Precautions, H&P , NPO status , Patient's Chart, lab work & pertinent test results  Airway Mallampati: II  TM Distance: >3 FB Neck ROM: Full    Dental  (+) Missing, Poor Dentition, Chipped, Dental Advisory Given   Pulmonary PE   Pulmonary exam normal breath sounds clear to auscultation       Cardiovascular hypertension, Normal cardiovascular exam Rhythm:Regular Rate:Normal     Neuro/Psych negative neurological ROS  negative psych ROS   GI/Hepatic negative GI ROS, Neg liver ROS,   Endo/Other  diabetes  Renal/GU Renal InsufficiencyRenal disease  negative genitourinary   Musculoskeletal negative musculoskeletal ROS (+)   Abdominal   Peds negative pediatric ROS (+)  Hematology negative hematology ROS (+)   Anesthesia Other Findings   Reproductive/Obstetrics negative OB ROS                            Anesthesia Physical Anesthesia Plan  ASA: III  Anesthesia Plan: General   Post-op Pain Management:    Induction: Intravenous  PONV Risk Score and Plan: 2 and Ondansetron, Dexamethasone and Treatment may vary due to age or medical condition  Airway Management Planned: LMA  Additional Equipment:   Intra-op Plan:   Post-operative Plan: Extubation in OR  Informed Consent: I have reviewed the patients History and Physical, chart, labs and discussed the procedure including the risks, benefits and alternatives for the proposed anesthesia with the patient or authorized representative who has indicated his/her understanding and acceptance.     Dental advisory given  Plan Discussed with: CRNA and Surgeon  Anesthesia Plan Comments:         Anesthesia Quick Evaluation

## 2020-10-19 NOTE — Transfer of Care (Signed)
Immediate Anesthesia Transfer of Care Note  Patient: [name]  Procedure(s) Performed: CYSTOSCOPY LEFT URETEROSCOPY/HOLMIUM LASER/STENT EXCHANGE (Left )  Patient Location: PACU  Anesthesia Type:General  Level of Consciousness: awake, sedated and patient cooperative  Airway & Oxygen Therapy: Patient connected to face mask oxygen  Post-op Assessment: Report given to RN and Post -op Vital signs reviewed and stable  Post vital signs: stable  Last Vitals:  Vitals Value Taken Time  BP 139/86 10/19/20 1237  Temp    Pulse 74 10/19/20 1238  Resp 12 10/19/20 1238  SpO2 100 % 10/19/20 1238  Vitals shown include unvalidated device data.  Last Pain:  Vitals:   10/19/20 0919  TempSrc:   PainSc: 0-No pain         Complications: No complications documented.

## 2020-10-20 ENCOUNTER — Encounter (HOSPITAL_COMMUNITY): Payer: Self-pay | Admitting: Urology

## 2020-10-23 LAB — SURGICAL PATHOLOGY

## 2020-10-24 LAB — CALCULI, WITH PHOTOGRAPH (CLINICAL LAB)
Uric Acid Calculi: 100 %
Weight Calculi: 16 mg

## 2020-11-06 ENCOUNTER — Encounter: Payer: Self-pay | Admitting: Family Medicine

## 2020-11-06 ENCOUNTER — Other Ambulatory Visit: Payer: Self-pay | Admitting: Family

## 2020-11-06 MED ORDER — GABAPENTIN 300 MG PO CAPS: 300.0000 mg | ORAL_CAPSULE | Freq: Four times a day (QID) | ORAL | 1 refills | Status: DC

## 2020-11-17 ENCOUNTER — Encounter: Payer: Self-pay | Admitting: Family Medicine

## 2020-11-19 ENCOUNTER — Other Ambulatory Visit: Payer: Self-pay | Admitting: Family Medicine

## 2020-11-21 ENCOUNTER — Other Ambulatory Visit: Payer: Self-pay | Admitting: Family Medicine

## 2020-11-21 DIAGNOSIS — G4733 Obstructive sleep apnea (adult) (pediatric): Secondary | ICD-10-CM

## 2020-11-22 NOTE — Progress Notes (Addendum)
CM sent to [facility] for new cpap orders. -Mask refitting and supplies.

## 2020-11-23 NOTE — Progress Notes (Signed)
Brown, Jessica  Sophi Calligan S, RN Thank you, will process.   

## 2020-11-27 NOTE — Progress Notes (Signed)
Towanda Octave, RN Hey there!   Just wanted to keep you in the loop. We contacted this patient on 11/23/20 and he stated he did not need a mask fit nor did he want it. CSR explained that we were calling because we had received an RX but he asked [place] to cancel it.   Thanks!  Margreta Journey

## 2020-11-28 ENCOUNTER — Ambulatory Visit (INDEPENDENT_AMBULATORY_CARE_PROVIDER_SITE_OTHER): Payer: Medicare Other | Admitting: Internal Medicine

## 2020-11-28 ENCOUNTER — Encounter: Payer: Self-pay | Admitting: Internal Medicine

## 2020-11-28 ENCOUNTER — Other Ambulatory Visit: Payer: Self-pay

## 2020-11-28 VITALS — BP 138/64 | HR 91 | Ht 73.0 in | Wt 255.8 lb

## 2020-11-28 DIAGNOSIS — I10 Essential (primary) hypertension: Secondary | ICD-10-CM | POA: Diagnosis not present

## 2020-11-28 DIAGNOSIS — R55 Syncope and collapse: Secondary | ICD-10-CM

## 2020-11-28 DIAGNOSIS — Z7901 Long term (current) use of anticoagulants: Secondary | ICD-10-CM

## 2020-11-28 DIAGNOSIS — E1142 Type 2 diabetes mellitus with diabetic polyneuropathy: Secondary | ICD-10-CM

## 2020-11-28 DIAGNOSIS — E782 Mixed hyperlipidemia: Secondary | ICD-10-CM | POA: Diagnosis not present

## 2020-11-28 DIAGNOSIS — Z794 Long term (current) use of insulin: Secondary | ICD-10-CM

## 2020-11-28 NOTE — Patient Instructions (Signed)

## 2020-11-28 NOTE — Progress Notes (Unsigned)
Cardiology Office Note:    Date:  11/28/2020   ID:  [name], DOB 1956/08/02, MRN [id number]  PCP:  [name], MD  Cardiologist:  Elouise Munroe, MD  Electrophysiologist:  None   Referring MD: [name]   Chief Complaint/Reason for Referral: syncope  History of Present Illness:    [name] is a 65 y.o. male with a history of PE on Coumadin, diabetes on insulin, stage IV chronic kidney disease with baseline creatinine around 1.7, recent kidney stones with intervention. Initially met him in hospital for an episode of syncope which responded to IV fluids and stopping his lisinopril and chlorthalidone.   Another episode of syncope 09/03/20. Had taken his amlodipine, losartan, and took a dose of tramadol knee pain. He is contraindicated from taking NSAIDS in light of renal function. He went to sit at the end of his drive way to hand out halloween candy and next thing he knew he had fainted. He has identified tramadol in combination with his antiHTN drugs, and has since switched to gabapentin with no recurrent issues.   Amlodipine 10 mg and losartan 50 daily - current antihypertensive regimen.   INR slightly subtherapeutic, no clear cause he can identify. AC for history of PE. Follows with PCP for INR.   Past Medical History:  Diagnosis Date  . Arthritis   . Chronic kidney disease    reduced kidney function. Stage III  . Diabetes mellitus without complication ([place])   . Dyspnea    On exertion  . History of kidney stones   . History of pulmonary embolus (PE)   . Hypertension   . Lipidemia   . Prostate enlargement     Past Surgical History:  Procedure Laterality Date  . CYSTOSCOPY W/ URETERAL STENT PLACEMENT Left 10/08/2020   Procedure: CYSTOSCOPY WITH RETROGRADE PYELOGRAM/URETERAL STENT PLACEMENT;  Surgeon: Irine Seal, MD;  Location: [facility];  Service: Urology;  Laterality: Left;  . CYSTOSCOPY/URETEROSCOPY/HOLMIUM LASER/STENT PLACEMENT Right  06/07/2020   Procedure: CYSTOSCOPY/URETEROSCOPY/HOLMIUM LASER/STENT PLACEMENT;  Surgeon: Lucas Mallow, MD;  Location[facility];  Service: Urology;  Laterality: Right;  . CYSTOSCOPY/URETEROSCOPY/HOLMIUM LASER/STENT PLACEMENT Left 10/19/2020   Procedure: CYSTOSCOPY LEFT URETEROSCOPY/HOLMIUM LASER/STENT EXCHANGE;  Surgeon: Irine Seal, MD;  Location[facility];  Service: Urology;  Laterality: Left;    Current Medications: Current Meds  Medication Sig  . Accu-Chek Softclix Lancets lancets Use as instructed to check blood sugar 3 times daily Dx is E11.65  . acetaminophen (TYLENOL) 650 MG CR tablet Take 650 mg by mouth in the morning and at bedtime.  [name] amLODipine (NORVASC) 10 MG tablet Take 1 tablet (10 mg total) by mouth daily.  [name] aspirin EC 81 MG tablet Take 81 mg by mouth daily.  . Blood Glucose Monitoring Suppl (ACCU-CHEK AVIVA) device by Other route. Use as instructed  . cilostazol (PLETAL) 100 MG tablet TAKE 1 TABLET BY MOUTH TWICE A DAY (Patient taking differently: Take 100 mg by mouth 2 (two) times daily.)  . gabapentin (NEURONTIN) 300 MG capsule Take 1 capsule (300 mg total) by mouth 4 (four) times daily.  [name] glucose blood (ACCU-CHEK GUIDE) test strip Use to check blood sugar 3 times daily.E11.65  . insulin NPH-regular Human (70-30) 100 UNIT/ML injection Inject 20 Units into the skin in the morning and at bedtime.  . Insulin Syringes, Disposable, U-100 1 ML MISC 1 Device by Does not apply route as directed.  [name] losartan (COZAAR) 50 MG tablet Take 50 mg by mouth daily.  [name]  lovastatin (MEVACOR) 40 MG tablet Take 1 tablet (40 mg total) by mouth at bedtime.  . montelukast (SINGULAIR) 10 MG tablet Take 1 tablet (10 mg total) by mouth at bedtime.  [name] warfarin (COUMADIN) 5 MG tablet TAKE 1 TABLET (5 MG TOTAL) BY MOUTH DAILY. TAKE AS DIRECTED. (Patient taking differently: Take 5-7.5 mg by mouth daily. Take one 5 mg tablet by mouth daily except on Thursdays and Saturdays take7.5 mg tablet)      Allergies:   Patient has no known allergies.   Social History   Tobacco Use  . Smoking status: Never Smoker  . Smokeless tobacco: Never Used  Vaping Use  . Vaping Use: Never used  Substance Use Topics  . Alcohol use: Yes    Alcohol/week: 1.0 standard drink    Types: 1 Cans of beer per week    Comment: social  . Drug use: Never     Family History: The patient's family history includes Cancer in his mother; Heart disease in his father.  ROS:   Please see the history of present illness.    All other systems reviewed and are negative.  EKGs/Labs/Other Studies Reviewed:    The following studies were reviewed today:  EKG:  NSR, rate 91   Recent Labs: 02/08/2020: B Natriuretic Peptide 11.3 02/09/2020: Magnesium 2.1; TSH 1.344 10/08/2020: ALT 24 10/16/2020: BUN 21; Creatinine, Ser 1.64; Hemoglobin 12.6; Platelets 314; Potassium 5.1; Sodium 141  Recent Lipid Panel    Component Value Date/Time   CHOL 156 12/22/2019 0949   TRIG 251.0 (H) 12/22/2019 0949   HDL 41.40 12/22/2019 0949   CHOLHDL 4 12/22/2019 0949   VLDL 50.2 (H) 12/22/2019 0949   LDLDIRECT 78.0 08/28/2020 1413    Physical Exam:    VS:  BP 138/64 (BP Location: Left Arm, Patient Position: Sitting)   Pulse 91   Ht 6' 1"  (1.854 m)   Wt 255 lb 12.8 oz (116 kg)   SpO2 99%   BMI 33.75 kg/m     Wt Readings from Last 5 Encounters:  11/28/20 255 lb 12.8 oz (116 kg)  10/19/20 245 lb 3.2 oz (111.2 kg)  10/17/20 245 lb 3.2 oz (111.2 kg)  10/16/20 244 lb 8 oz (110.9 kg)  10/13/20 244 lb 6.4 oz (110.9 kg)    Constitutional: No acute distress Eyes: sclera non-icteric, normal conjunctiva and lids ENMT: normal dentition, moist mucous membranes Cardiovascular: regular rhythm, normal rate, no murmurs. S1 and S2 normal. Radial pulses normal bilaterally. No jugular venous distention.  Respiratory: clear to auscultation bilaterally GI : normal bowel sounds, soft and nontender. No distention.   MSK: extremities warm,  well perfused. No edema.  NEURO: grossly nonfocal exam, moves all extremities. PSYCH: alert and oriented x 3, normal mood and affect.   ASSESSMENT:    1. Essential hypertension   2. Syncope, unspecified syncope type   3. Long term (current) use of anticoagulants   4. Mixed hyperlipidemia   5. Type 2 diabetes mellitus with diabetic polyneuropathy, with long-term current use of insulin (HCC)    PLAN:    Essential hypertension - Plan: EKG 12-Lead - continue current regimen of amlodipine and losartan. Borderline BP but would want to avoid hypotensive syncope, which may be the source of his two episodes.   Syncope, unspecified syncope type - as above, remain well hydrated and avoid medications in combination which may case hypotension.   Long term (current) use of anticoagulants - followed by PCP  Mixed hyperlipidemia - continue lovastatin, discussed  that triglycerides reflect blood sugar control, recent A1C 7.2%. Can recheck with [place] or PCP in 6 mo, would recommend adjustment of HLD therapy if trigs remain elevated.  Type 2 diabetes mellitus with diabetic polyneuropathy, with long-term current use of insulin (HCC) - per PCP, recent A1C as above  Total time of encounter: 30 minutes total time of encounter, including 20 minutes spent in face-to-face patient care on the date of this encounter. This time includes coordination of care and counseling regarding above mentioned problem list. Remainder of non-face-to-face time involved reviewing chart documents/testing relevant to the patient encounter and documentation in the medical record. I have independently reviewed documentation from referring provider.   Cherlynn Kaiser, MD [facility]    Medication Adjustments/Labs and Tests Ordered: Current medicines are reviewed at length with the patient today.  Concerns regarding medicines are outlined above.   Orders Placed This Encounter  Procedures  . EKG 12-Lead    No  orders of the defined types were placed in this encounter.   Patient Instructions  Medication Instructions:  No Changes In Medications at this time.  *If you need a refill on your cardiac medications before your next appointment, please call your pharmacy*  Follow-Up: At [facility], you and your health needs are our priority.  As part of our continuing mission to provide you with exceptional heart care, we have created designated Provider Care Teams.  These Care Teams include your primary Cardiologist (physician) and Advanced Practice Providers (APPs -  Physician Assistants and Nurse Practitioners) who all work together to provide you with the care you need, when you need it.  Your next appointment:   6 month(s)  The format for your next appointment:   In Person  Provider:   Cherlynn Kaiser, MD

## 2020-12-11 ENCOUNTER — Other Ambulatory Visit: Payer: Self-pay | Admitting: Family Medicine

## 2020-12-30 ENCOUNTER — Other Ambulatory Visit: Payer: Self-pay | Admitting: Family Medicine

## 2021-01-05 ENCOUNTER — Other Ambulatory Visit: Payer: Self-pay | Admitting: Family

## 2021-01-05 ENCOUNTER — Other Ambulatory Visit: Payer: Self-pay | Admitting: Family Medicine

## 2021-01-26 ENCOUNTER — Ambulatory Visit (INDEPENDENT_AMBULATORY_CARE_PROVIDER_SITE_OTHER): Payer: Medicare Other | Admitting: Internal Medicine

## 2021-01-26 ENCOUNTER — Encounter: Payer: Self-pay | Admitting: Internal Medicine

## 2021-01-26 ENCOUNTER — Other Ambulatory Visit: Payer: Self-pay

## 2021-01-26 VITALS — BP 138/70 | HR 84 | Resp 18 | Ht 73.0 in | Wt 249.6 lb

## 2021-01-26 DIAGNOSIS — E1122 Type 2 diabetes mellitus with diabetic chronic kidney disease: Secondary | ICD-10-CM | POA: Diagnosis not present

## 2021-01-26 DIAGNOSIS — Z794 Long term (current) use of insulin: Secondary | ICD-10-CM | POA: Diagnosis not present

## 2021-01-26 DIAGNOSIS — N1832 Chronic kidney disease, stage 3b: Secondary | ICD-10-CM

## 2021-01-26 DIAGNOSIS — E1142 Type 2 diabetes mellitus with diabetic polyneuropathy: Secondary | ICD-10-CM

## 2021-01-26 LAB — POCT GLYCOSYLATED HEMOGLOBIN (HGB A1C): Hemoglobin A1C: 7 % — AB (ref 4.0–5.6)

## 2021-01-26 NOTE — Patient Instructions (Addendum)
-   Keep Up the good Work ! - Humulin Mix 50 units Before breakfast and 52 units before supper      HOW TO TREAT LOW BLOOD SUGARS (Blood sugar LESS THAN 70 MG/DL)  Please follow the RULE OF 15 for the treatment of hypoglycemia treatment (when your (blood sugars are less than 70 mg/dL)    STEP 1: Take 15 grams of carbohydrates when your blood sugar is low, which includes:   3-4 GLUCOSE TABS  OR  3-4 OZ OF JUICE OR REGULAR SODA OR  ONE TUBE OF GLUCOSE GEL     STEP 2: RECHECK blood sugar in 15 MINUTES STEP 3: If your blood sugar is still low at the 15 minute recheck --> then, go back to STEP 1 and treat AGAIN with another 15 grams of carbohydrates.

## 2021-01-26 NOTE — Progress Notes (Signed)
Name: [name]  Age/ Sex: 65 y.o., male   MRN/ DOB: [id number], 01/23/1956     PCP: [name], MD   Reason for Endocrinology Evaluation: Type 2 Diabetes Mellitus  Initial Endocrine Consultative Visit: 02/23/2020    PATIENT IDENTIFIER: [name] is a 65 y.o. male with a past medical history of HTN, T2DM and Hx of PE. The patient has followed with Endocrinology clinic since 02/23/2020 for consultative assistance with management of his diabetes.  DIABETIC HISTORY:  [name] was diagnosed with T2DM in 2009, metformin- stopped 12/2019 due to AKI as well as Glyburide. Has been on insulin mix . His hemoglobin A1c has ranged from 7.6% ,peaking at 8.6% in 2021   On her initial visit to  [facility] her A1c was 8.6% , she was on Humulin MIx which we adjusted    Moved from [place]  ~ early 2021 SUBJECTIVE:   During the last visit (10/02/2020): A1c 7.2 %. Adjusted Humulin Mix   Today (01/26/2021): [name]  He checks his blood sugars 1 times daily. The patient has had hypoglycemic episodes since the last clinic visit.  Denies nausea or diarrhea.     HOME DIABETES REGIMEN:  Humulin Mix 50 units with Breakfast and 52 units with Supper- has been taking 52 BID      Statin: yes ACE-I/ARB: Intolerant to Lisinopril to AKI    METER DOWNLOAD SUMMARY: 2/24-3/25/2022 Average Number Tests/Day = 0.7 Overall Mean FS Glucose = 105 Standard Deviation = 34  BG Ranges: Low = 69 High = 211    Hypoglycemic Events/30 Days: BG < 50 = 0 Episodes of symptomatic severe hypoglycemia =0    DIABETIC COMPLICATIONS: Microvascular complications:   CKD, neuropathy  Denies: retinopathy  Last eye exam: Completed 04/2020  Macrovascular complications:    Denies: CAD, PVD, CVA    HISTORY:  Past Medical History:  Past Medical History:  Diagnosis Date  . Arthritis   . Chronic kidney disease    reduced kidney function. Stage III  . Diabetes mellitus without complication  ([place])   . Dyspnea    On exertion  . History of kidney stones   . History of pulmonary embolus (PE)   . Hypertension   . Lipidemia   . Prostate enlargement    Past Surgical History:  Past Surgical History:  Procedure Laterality Date  . CYSTOSCOPY W/ URETERAL STENT PLACEMENT Left 10/08/2020   Procedure: CYSTOSCOPY WITH RETROGRADE PYELOGRAM/URETERAL STENT PLACEMENT;  Surgeon: Irine Seal, MD;  Location: [facility];  Service: Urology;  Laterality: Left;  . CYSTOSCOPY/URETEROSCOPY/HOLMIUM LASER/STENT PLACEMENT Right 06/07/2020   Procedure: CYSTOSCOPY/URETEROSCOPY/HOLMIUM LASER/STENT PLACEMENT;  Surgeon: Lucas Mallow, MD;  [facility];  Service: Urology;  Laterality: Right;  . CYSTOSCOPY/URETEROSCOPY/HOLMIUM LASER/STENT PLACEMENT Left 10/19/2020   Procedure: CYSTOSCOPY LEFT URETEROSCOPY/HOLMIUM LASER/STENT EXCHANGE;  Surgeon: Irine Seal, MD;  [facility];  Service: Urology;  Laterality: Left;    Social History:  reports that he has never smoked. He has never used smokeless tobacco. He reports current alcohol use of about 1.0 standard drink of alcohol per week. He reports that he does not use drugs. Family History:  Family History  Problem Relation Age of Onset  . Cancer Mother   . Heart disease Father      HOME MEDICATIONS: Allergies as of 01/26/2021   No Known Allergies     Medication List       Accurate as of January 26, 2021  9:29 AM. If you have any  questions, ask your nurse or doctor.        Accu-Chek Aviva device by Other route. Use as instructed   Accu-Chek Guide test strip Generic drug: glucose blood Use to check blood sugar 3 times daily.E11.65   Accu-Chek Softclix Lancets lancets Use as instructed to check blood sugar 3 times daily Dx is E11.65   acetaminophen 650 MG CR tablet Commonly known as: TYLENOL Take 650 mg by mouth in the morning and at bedtime.   amLODipine 10 MG tablet Commonly known as: NORVASC Take 1 tablet (10 mg total) by mouth  daily.   aspirin EC 81 MG tablet Take 81 mg by mouth daily.   cilostazol 100 MG tablet Commonly known as: PLETAL Take 1 tablet (100 mg total) by mouth 2 (two) times daily.   gabapentin 300 MG capsule Commonly known as: NEURONTIN TAKE 1 CAPSULE BY MOUTH 4 TIMES DAILY.   insulin NPH-regular Human (70-30) 100 UNIT/ML injection Inject 20 Units into the skin in the morning and at bedtime.   Insulin Syringes (Disposable) U-100 1 ML Misc 1 Device by Does not apply route as directed.   losartan 50 MG tablet Commonly known as: COZAAR Take 50 mg by mouth daily.   lovastatin 40 MG tablet Commonly known as: MEVACOR Take 1 tablet (40 mg total) by mouth at bedtime. **Needs appt before next refill**   montelukast 10 MG tablet Commonly known as: SINGULAIR Take 1 tablet (10 mg total) by mouth at bedtime.   warfarin 5 MG tablet Commonly known as: COUMADIN Take as directed by the anticoagulation clinic. If you are unsure how to take this medication, talk to your nurse or doctor. Original instructions: Take 1 tablet (5 mg total) by mouth daily. Take as directed.        OBJECTIVE:   Vital Signs: BP 138/70 (BP Location: Left Arm, Patient Position: Sitting, Cuff Size: Normal)   Pulse 84   Resp 18   Ht 6\' 1"  (1.854 m)   Wt 249 lb 9.6 oz (113.2 kg)   SpO2 98%   BMI 32.93 kg/m   Wt Readings from Last 3 Encounters:  01/26/21 249 lb 9.6 oz (113.2 kg)  11/28/20 255 lb 12.8 oz (116 kg)  10/19/20 245 lb 3.2 oz (111.2 kg)     Exam: General: Pt appears well and is in NAD  Lungs: Clear with good BS bilat with no rales, rhonchi, or wheezes  Heart: RRR   Extremities: 1+  pretibial edema.   Neuro: MS is good with appropriate affect, pt is alert and Ox3     DM foot exam: 02/23/2020  The skin of the feet is without sores or ulcerations. Toe nails thickened The pedal pulses are 2+ on right and 2+ on left. The sensation is decreased to a screening 5.07, 10 gram monofilament  bilaterally    DATA REVIEWED:  Lab Results  Component Value Date   HGBA1C 7.0 (A) 01/26/2021   HGBA1C 7.2 (A) 09/22/2020   HGBA1C 9.2 (A) 05/26/2020   Lab Results  Component Value Date   MICROALBUR 4.9 (H) 08/28/2020   CREATININE 1.64 (H) 10/16/2020   Lab Results  Component Value Date   MICRALBCREAT 4.4 08/28/2020     Lab Results  Component Value Date   CHOL 156 12/22/2019   HDL 41.40 12/22/2019   LDLDIRECT 78.0 08/28/2020   TRIG 251.0 (H) 12/22/2019   CHOLHDL 4 12/22/2019         ASSESSMENT / PLAN / RECOMMENDATIONS:   1)  Type 2 Diabetes Mellitus, Optimally controlled, With Neuropathic and CKD III complications - Most recent A1c of 7.2 %. Goal A1c < 7.0 %.    - I have praised the pt on improved glycemic control  - He had hypoglycemia early evening , he did not decrease his morning insulin dose as previously advised   - We had  discussed add- on therapy with GLP1 agonists in the past but he opted to continue with current regimen - Pt is cleared for knee replacement from the endocrinology stand point   MEDICATIONS: Humulin Mix  50 units Before breakfast and 52 units before supper    EDUCATION / INSTRUCTIONS:  BG monitoring instructions: Patient is instructed to check his blood sugars 2 times a day, before breakfast and supper.  Call [facility] clinic if: BG persistently < 70 . I reviewed the Rule of 15 for the treatment of hypoglycemia in detail with the patient. Literature supplied.   2) Diabetic complications:   Eye: Does not have known diabetic retinopathy.   Neuro/ Feet: Does  have known diabetic peripheral neuropathy .   Renal: Patient does  have known baseline CKD. He  Is on ARB.      F/U in 4 months     Signed electronically by: Mack Guise, MD  [facility]  [facility] [address] [place], [place] [address] Phone: [phone] FAX: [phone]   CC: [name], MD [place] [address] Phone: [phone]  Fax: [phone]  Return to Endocrinology clinic as below: Future Appointments  Date Time Provider [place]  03/21/2021  9:30 AM Debbora Presto, NP [facility] None

## 2021-01-31 ENCOUNTER — Encounter: Payer: Self-pay | Admitting: Orthopaedic Surgery

## 2021-02-08 ENCOUNTER — Encounter: Payer: Self-pay | Admitting: Family Medicine

## 2021-02-12 NOTE — Telephone Encounter (Signed)
I'm not sure if you have already or not but it sounds like his PCP wants a clearance form please. Thanks.

## 2021-02-13 NOTE — Telephone Encounter (Signed)
A surgical clearance request was faxed to Dr. Ethelene Hal today.  Can I please get a surgery sheet for this patient?

## 2021-02-14 ENCOUNTER — Other Ambulatory Visit: Payer: Self-pay | Admitting: Family Medicine

## 2021-02-14 ENCOUNTER — Other Ambulatory Visit: Payer: Self-pay | Admitting: Internal Medicine

## 2021-02-14 DIAGNOSIS — M545 Low back pain, unspecified: Secondary | ICD-10-CM

## 2021-02-15 ENCOUNTER — Other Ambulatory Visit: Payer: Self-pay | Admitting: Family Medicine

## 2021-02-15 DIAGNOSIS — M545 Low back pain, unspecified: Secondary | ICD-10-CM

## 2021-02-15 NOTE — Telephone Encounter (Signed)
Please see message and advise.  Thank you. ° °

## 2021-02-15 NOTE — Telephone Encounter (Signed)
Last OV 10/17/20 [facility] Last fill 02/18/20 #90/3

## 2021-02-20 ENCOUNTER — Other Ambulatory Visit: Payer: Self-pay | Admitting: Internal Medicine

## 2021-02-26 ENCOUNTER — Telehealth: Payer: Self-pay | Admitting: Orthopaedic Surgery

## 2021-02-26 NOTE — Telephone Encounter (Signed)
Patient is scheduled for left total knee arthroplasty with Dr. Erlinda Hong on 04-09-21.  Per Dr. Phoebe Sharps request, recommendations on stopping warfarin was sent to Dr. Ethelene Hal.    Dr. Bebe Shaggy instructions are to stop coumadin 5 days prior to surgery. Bridge with LMW Heparin.    Patient is asking what the instructions are for Cilostazol (his vasodilator Rx).  Patient needs to know if he is to stop this or continue.    Pt cb [phone]

## 2021-02-26 NOTE — Telephone Encounter (Signed)
I'm not sure about the cilostazol.  I will have to defer to anesthesia for that one.  Thanks.

## 2021-03-16 ENCOUNTER — Other Ambulatory Visit: Payer: Self-pay | Admitting: Family

## 2021-03-21 ENCOUNTER — Ambulatory Visit: Payer: Medicare Other | Admitting: Family Medicine

## 2021-03-25 ENCOUNTER — Other Ambulatory Visit: Payer: Self-pay | Admitting: Family Medicine

## 2021-03-28 ENCOUNTER — Other Ambulatory Visit: Payer: Self-pay | Admitting: Internal Medicine

## 2021-03-28 ENCOUNTER — Encounter: Payer: Self-pay | Admitting: Family Medicine

## 2021-03-28 ENCOUNTER — Other Ambulatory Visit: Payer: Self-pay

## 2021-03-28 MED ORDER — GABAPENTIN 300 MG PO CAPS: ORAL_CAPSULE | ORAL | 1 refills | Status: DC

## 2021-03-28 MED ORDER — INSULIN SYRINGES (DISPOSABLE) U-100 1 ML MISC
1.0000 | 11 refills | Status: DC
Start: 2021-03-28 — End: 2022-08-20

## 2021-03-29 NOTE — Pre-Procedure Instructions (Addendum)
Surgical Instructions    Your procedure is scheduled on Monday June 6th.  Report to [facility] Main Entrance "A" at 10:50 A.M., then check in with the Admitting office.  Call this number if you have problems the morning of surgery:  [phone]   If you have any questions prior to your surgery date call [phone]: Open Monday-Friday 8am-4pm    Remember:  Do not eat after midnight the night before your surgery  You may drink clear liquids until 09:50 A.M. the morning of your surgery.   Clear liquids allowed are: Water, Non-Citrus Juices (without pulp), Carbonated Beverages, Clear Tea, Black Coffee Only, and Gatorade  Patient Instructions   . The day of surgery (if you have diabetes): o Drink ONE (1) 10 oz water bottle given to you in your pre admission testing appointment by 09:50 A.M. the morning of surgery. Drink in one sitting. Do not sip.  o This drink was given to you during your hospital  pre-op appointment visit.  o Nothing else to drink after completing the  10 oz bottle of water.          If you have questions, please contact your surgeon's office.     Take these medicines the morning of surgery with A SIP OF WATER   acetaminophen (TYLENOL)- If needed  amLODipine (NORVASC)   gabapentin (NEURONTIN)    methocarbamol (ROBAXIN)- If needed  traMADol (ULTRAM)- If needed  Please follow your surgeons instructions on stopping cilostazol (Pletal) and warfarin (Coumadin). Per Dr. Bebe Shaggy instructions you are to stop Coumadin 5 days prior to surgery. Stop after dose on 04/03/21.   As of today, STOP taking any Aspirin (unless otherwise instructed by your surgeon) Aleve, Naproxen, Ibuprofen, Motrin, Advil, Goody's, BC's, all herbal medications, fish oil, and all vitamins.   WHAT DO I DO ABOUT MY DIABETES MEDICATION?   [name] Do not take oral diabetes medicines (pills) the morning of surgery.  . THE NIGHT BEFORE SURGERY, take 36 units of NPH-regular Human (HUMULIN 70/30)  insulin.     . THE MORNING OF SURGERY, do not take any  NPH-regular Human (HUMULIN 70/30) insulin.  . The day of surgery, do not take other diabetes injectables, including Byetta (exenatide), Bydureon (exenatide ER), Victoza (liraglutide), or Trulicity (dulaglutide).  . If your CBG is greater than 220 mg/dL, you may take  of your sliding scale (correction) dose of insulin.   HOW TO MANAGE YOUR DIABETES BEFORE AND AFTER SURGERY  Why is it important to control my blood sugar before and after surgery? . Improving blood sugar levels before and after surgery helps healing and can limit problems. . A way of improving blood sugar control is eating a healthy diet by: o  Eating less sugar and carbohydrates o  Increasing activity/exercise o  Talking with your doctor about reaching your blood sugar goals . High blood sugars (greater than 180 mg/dL) can raise your risk of infections and slow your recovery, so you will need to focus on controlling your diabetes during the weeks before surgery. . Make sure that the doctor who takes care of your diabetes knows about your planned surgery including the date and location.  How do I manage my blood sugar before surgery? . Check your blood sugar at least 4 times a day, starting 2 days before surgery, to make sure that the level is not too high or low.  . Check your blood sugar the morning of your surgery when you wake up and every 2 hours  until you get to the Short Stay unit.  o If your blood sugar is less than 70 mg/dL, you will need to treat for low blood sugar: - Do not take insulin. - Treat a low blood sugar (less than 70 mg/dL) with  cup of clear juice (cranberry or apple), 4 glucose tablets, OR glucose gel. - Recheck blood sugar in 15 minutes after treatment (to make sure it is greater than 70 mg/dL). If your blood sugar is not greater than 70 mg/dL on recheck, call [phone] for further instructions. . Report your blood sugar to the short stay  nurse when you get to Short Stay.  . If you are admitted to the hospital after surgery: o Your blood sugar will be checked by the staff and you will probably be given insulin after surgery (instead of oral diabetes medicines) to make sure you have good blood sugar levels. o The goal for blood sugar control after surgery is 80-180 mg/dL.                      Do NOT Smoke (Tobacco/Vaping) or drink Alcohol 24 hours prior to your procedure.  If you use a CPAP at night, you may bring all equipment for your overnight stay.   Contacts, glasses, piercing's, hearing aid's, dentures or partials may not be worn into surgery, please bring cases for these belongings.    For patients admitted to the hospital, discharge time will be determined by your treatment team.   Patients discharged the day of surgery will not be allowed to drive home, and someone needs to stay with them for 24 hours.    Special instructions:   [place]- Preparing For Surgery  Before surgery, you can play an important role. Because skin is not sterile, your skin needs to be as free of germs as possible. You can reduce the number of germs on your skin by washing with CHG (chlorahexidine gluconate) Soap before surgery.  CHG is an antiseptic cleaner which kills germs and bonds with the skin to continue killing germs even after washing.    Oral Hygiene is also important to reduce your risk of infection.  Remember - BRUSH YOUR TEETH THE MORNING OF SURGERY WITH YOUR REGULAR TOOTHPASTE  Please do not use if you have an allergy to CHG or antibacterial soaps. If your skin becomes reddened/irritated stop using the CHG.  Do not shave (including legs and underarms) for at least 48 hours prior to first CHG shower. It is OK to shave your face.  Please follow these instructions carefully.   1. Shower the NIGHT BEFORE SURGERY and the MORNING OF SURGERY  2. If you chose to wash your hair, wash your hair first as usual with your normal  shampoo.  3. After you shampoo, rinse your hair and body thoroughly to remove the shampoo.  4. Use CHG Soap as you would any other liquid soap. You can apply CHG directly to the skin and wash gently with a scrungie or a clean washcloth.   5. Apply the CHG Soap to your body ONLY FROM THE NECK DOWN.  Do not use on open wounds or open sores. Avoid contact with your eyes, ears, mouth and genitals (private parts). Wash Face and genitals (private parts)  with your normal soap.   6. Wash thoroughly, paying special attention to the area where your surgery will be performed.  7. Thoroughly rinse your body with warm water from the neck down.  8. DO  NOT shower/wash with your normal soap after using and rinsing off the CHG Soap.  9. Pat yourself dry with a CLEAN TOWEL.  10. Wear CLEAN PAJAMAS to bed the night before surgery  11. Place CLEAN SHEETS on your bed the night before your surgery  12. DO NOT SLEEP WITH PETS.   Day of Surgery: Shower with CHG soap. Do not wear jewelry, make up, or nail polish Do not wear lotions, powders, perfumes/colognes, or deodorant. Do not shave 48 hours prior to surgery.  Men may shave face and neck. Do not bring valuables to the hospital. [facility] is not responsible for any belongings or valuables. Wear Clean/Comfortable clothing the morning of surgery Remember to brush your teeth WITH YOUR REGULAR TOOTHPASTE.   Please read over the following fact sheets that you were given.

## 2021-03-30 ENCOUNTER — Encounter: Payer: Self-pay | Admitting: Family Medicine

## 2021-03-30 ENCOUNTER — Other Ambulatory Visit: Payer: Self-pay

## 2021-03-30 ENCOUNTER — Telehealth: Payer: Self-pay | Admitting: Orthopaedic Surgery

## 2021-03-30 ENCOUNTER — Encounter (HOSPITAL_COMMUNITY): Payer: Self-pay | Admitting: Physician Assistant

## 2021-03-30 ENCOUNTER — Encounter (HOSPITAL_COMMUNITY): Payer: Self-pay

## 2021-03-30 ENCOUNTER — Telehealth: Payer: Self-pay | Admitting: Family Medicine

## 2021-03-30 ENCOUNTER — Encounter (HOSPITAL_COMMUNITY)
Admission: RE | Admit: 2021-03-30 | Discharge: 2021-03-30 | Disposition: A | Discharge status: Home / Self Care | Payer: Medicare Other | Source: Ambulatory Visit | Attending: Orthopaedic Surgery | Admitting: Orthopaedic Surgery

## 2021-03-30 ENCOUNTER — Ambulatory Visit (HOSPITAL_COMMUNITY)
Admission: RE | Admit: 2021-03-30 | Discharge: 2021-03-30 | Disposition: A | Discharge status: Home / Self Care | Payer: Medicare Other | Source: Ambulatory Visit | Attending: Physician Assistant | Admitting: Physician Assistant

## 2021-03-30 DIAGNOSIS — Z01818 Encounter for other preprocedural examination: Secondary | ICD-10-CM | POA: Diagnosis not present

## 2021-03-30 DIAGNOSIS — M1712 Unilateral primary osteoarthritis, left knee: Secondary | ICD-10-CM | POA: Insufficient documentation

## 2021-03-30 DIAGNOSIS — R7309 Other abnormal glucose: Secondary | ICD-10-CM | POA: Insufficient documentation

## 2021-03-30 HISTORY — DX: Sleep apnea, unspecified: G47.30

## 2021-03-30 LAB — URINALYSIS, ROUTINE W REFLEX MICROSCOPIC
Bilirubin Urine: NEGATIVE
Glucose, UA: 50 mg/dL — AB
Hgb urine dipstick: NEGATIVE
Ketones, ur: NEGATIVE mg/dL
Leukocytes,Ua: NEGATIVE
Nitrite: NEGATIVE
Protein, ur: NEGATIVE mg/dL
Specific Gravity, Urine: 1.018 (ref 1.005–1.030)
pH: 5 (ref 5.0–8.0)

## 2021-03-30 LAB — APTT: aPTT: 30 seconds (ref 24–36)

## 2021-03-30 LAB — TYPE AND SCREEN
ABO/RH(D): O POS
Antibody Screen: NEGATIVE

## 2021-03-30 LAB — COMPREHENSIVE METABOLIC PANEL
ALT: 20 U/L (ref 0–44)
AST: 18 U/L (ref 15–41)
Albumin: 3.7 g/dL (ref 3.5–5.0)
Alkaline Phosphatase: 69 U/L (ref 38–126)
Anion gap: 8 (ref 5–15)
BUN: 20 mg/dL (ref 8–23)
CO2: 21 mmol/L — ABNORMAL LOW (ref 22–32)
Calcium: 8.9 mg/dL (ref 8.9–10.3)
Chloride: 110 mmol/L (ref 98–111)
Creatinine, Ser: 1.77 mg/dL — ABNORMAL HIGH (ref 0.61–1.24)
GFR, Estimated: 42 mL/min — ABNORMAL LOW (ref >60)
Glucose, Bld: 189 mg/dL — ABNORMAL HIGH (ref 70–99)
Potassium: 4.6 mmol/L (ref 3.5–5.1)
Sodium: 139 mmol/L (ref 135–145)
Total Bilirubin: 0.8 mg/dL (ref 0.3–1.2)
Total Protein: 7.3 g/dL (ref 6.5–8.1)

## 2021-03-30 LAB — CBC WITH DIFFERENTIAL/PLATELET
Abs Immature Granulocytes: 0.05 10*3/uL (ref 0.00–0.07)
Basophils Absolute: 0.1 10*3/uL (ref 0.0–0.1)
Basophils Relative: 1 %
Eosinophils Absolute: 0.3 10*3/uL (ref 0.0–0.5)
Eosinophils Relative: 4 %
HCT: 41.1 % (ref 39.0–52.0)
Hemoglobin: 13.3 g/dL (ref 13.0–17.0)
Immature Granulocytes: 1 %
Lymphocytes Relative: 18 %
Lymphs Abs: 1.3 10*3/uL (ref 0.7–4.0)
MCH: 29.7 pg (ref 26.0–34.0)
MCHC: 32.4 g/dL (ref 30.0–36.0)
MCV: 91.7 fL (ref 80.0–100.0)
Monocytes Absolute: 0.7 10*3/uL (ref 0.1–1.0)
Monocytes Relative: 10 %
Neutro Abs: 5.1 10*3/uL (ref 1.7–7.7)
Neutrophils Relative %: 66 %
Platelets: 236 10*3/uL (ref 150–400)
RBC: 4.48 MIL/uL (ref 4.22–5.81)
RDW: 13.8 % (ref 11.5–15.5)
WBC: 7.5 10*3/uL (ref 4.0–10.5)
nRBC: 0 % (ref 0.0–0.2)

## 2021-03-30 LAB — HEMOGLOBIN A1C
Hgb A1c MFr Bld: 7.9 % — ABNORMAL HIGH (ref 4.8–5.6)
Mean Plasma Glucose: 180 mg/dL

## 2021-03-30 LAB — GLUCOSE, CAPILLARY: Glucose-Capillary: 179 mg/dL — ABNORMAL HIGH (ref 70–99)

## 2021-03-30 LAB — SURGICAL PCR SCREEN
MRSA, PCR: NEGATIVE
Staphylococcus aureus: POSITIVE — AB

## 2021-03-30 LAB — PROTIME-INR
INR: 1.6 — ABNORMAL HIGH (ref 0.8–1.2)
Prothrombin Time: 19 seconds — ABNORMAL HIGH (ref 11.4–15.2)

## 2021-03-30 NOTE — Progress Notes (Addendum)
PCP - Dr. Abelino Derrick Cardiologist - Dr. Cherlynn Kaiser  PPM/ICD - n/a Device Orders - n/a Rep Notified - n/a  Chest x-ray - 03/30/21 EKG - 11/28/20 Stress Test - denies ECHO - 02/09/20 Cardiac Cath - denies  Sleep Study - Yes. 2021 CPAP - Yes. Wears nightly  Fasting Blood Sugar - 100-150 Checks Blood Sugar two to three times a day CBG today- 179. Patient had a bagel, cream cheese, and coffee today.   Blood Thinner Instructions: Patient is on Pletal and Coumadin. Per note in chart on 02/26/21, Dr. Bebe Shaggy instructions are to stop Coumadin 5 days prior to surgery and bridge with LMW Heparin. Note from Dr. Erlinda Hong on 4/25 states "I'm not sure about the cilostazol.  I will have to defer to anesthesia for that one." Talked to anesthesia and instructed that Dr. Ethelene Hal should instruct patient on when to stop Pletal and how to bridge with heparin. Called and spoke to [place] at Dr. Bebe Shaggy office and she sent a message to Dr. Bebe Shaggy nurse and Dr. Ethelene Hal. No response as of time patient finished his PAT appointment. Patient also sent a my chart message to Dr. Ethelene Hal. Patient is aware to stop Coumadin after dose on 5/31 and will await to hear from Dr. Bebe Shaggy office about stopping Pletal and starting Heparin. Karoline Caldwell, PA in anesthesia is aware and will follow up on Tuesday.    ERAS Protcol - Yes PRE-SURGERY Ensure or G2- Water  COVID TEST- Scheduled for 04/05/21. Patient is aware of date, time, and location.   Anesthesia review: Yes. Abnormal labs. PT/INR. Spoke to [place] at Dr. Phoebe Sharps office to notify of abnormal PT/INR and that patient is waiting on instructions when to stop Pletal and how to bridge heparin.  Inbasket message also sent to Dr. Erlinda Hong.   Patient denies shortness of breath, fever, cough and chest pain at PAT appointment   All instructions explained to the patient, with a verbal understanding of the material. Patient agrees to go over the instructions while at home for a better  understanding. Patient also instructed to self quarantine after being tested for COVID-19. The opportunity to ask questions was provided.

## 2021-03-30 NOTE — Telephone Encounter (Signed)
Please advise message below  °

## 2021-03-30 NOTE — Progress Notes (Signed)
Called and spoke to [place] at Dr. Phoebe Sharps office to notify of abnormal PT/INR. Inbasket message also sent to Dr. Erlinda Hong.

## 2021-03-30 NOTE — Progress Notes (Signed)
Received a call from Signe Colt, CMA at Dr. Bebe Shaggy office stating that per Dr. Ethelene Hal patient should hold coumadin 5 days prior to surgery and it is up to Dr. Erlinda Hong whether or not he wants the patient to bridge with heparin and when to stop Pletal. Called and spoke to [facility] at Dr. Phoebe Sharps office and made her aware of Dr. Bebe Shaggy response. Jackelyn Poling stated she will inform Dr. Erlinda Hong.

## 2021-03-30 NOTE — Telephone Encounter (Signed)
This is in reference to today's pt message that Dr. Ethelene Hal responded to. Natalie from short stay preadmission knows to stop the warfarin (COUMADIN; however, she is needing to know if they bridge with heparin  then they will need orders from Dr. Ethelene Hal to do so. They can not make this call. Please advise Lanelle Bal at [phone].

## 2021-03-30 NOTE — Telephone Encounter (Signed)
Patient's left total knee arthroplasty is scheduled with Dr Erlinda Hong 04-09-21.  Natalie with [facility] PAT called in reference to patient's  PT results of 19 and INR at 1.6.  Dr. Erlinda Hong is aware of the lab results. Patient has been instructed to stop Coumadin 5 days prior to surgery and bridge with LMW heparin. I have called patient with Instructions for the vasodilator Rx -Cilostazo.  Per Dr. Erlinda Hong, patient is to hold Cilostazol 7 days prior to surgery.

## 2021-03-30 NOTE — Telephone Encounter (Signed)
FYI

## 2021-03-31 NOTE — Telephone Encounter (Signed)
A1C is 7.9 at preop testing.  The surgery needs to be postponed for 2-3 months in order for the diabetes to be under better control.

## 2021-04-02 NOTE — Progress Notes (Signed)
Dr. Kelton Pillar, I want to send you a note about our mutual patient [name].  He's scheduled for a total knee replacement on 04/09/21 but unfortunately, the A1c is 7.9 which is too high for surgery.  It will need to be 7.7 or lower.  I called him tonight and explained everything and he asked that I send you a note so that you could bring him in to get his A1c down for surgery.  Please let me know if you have any questions.  Thank you.

## 2021-04-03 ENCOUNTER — Telehealth: Payer: Self-pay | Admitting: Internal Medicine

## 2021-04-03 NOTE — Progress Notes (Signed)
Thanks for all your help.  Take care.

## 2021-04-03 NOTE — Telephone Encounter (Signed)
-----   Message from Leandrew Koyanagi, MD sent at 04/02/2021  8:56 PM EDT ----- Dr. Kelton Pillar, I want to send you a note about our mutual patient [name].  He's scheduled for a total knee replacement on 04/09/21 but unfortunately, the A1c is 7.9 which is too high for surgery.  It will need to be 7.7 or lower.  I called him tonight and explained  everything and he asked that I send you a note so that you could bring him in to get his A1c down for surgery.  Please let me know if you have any questions.  Thank you.

## 2021-04-05 ENCOUNTER — Other Ambulatory Visit (HOSPITAL_COMMUNITY): Payer: Medicare Other

## 2021-04-05 NOTE — Telephone Encounter (Signed)
appt set for 6/20 at 4:00

## 2021-04-09 ENCOUNTER — Ambulatory Visit (HOSPITAL_COMMUNITY): Admission: RE | Admit: 2021-04-09 | Payer: Medicare Other | Source: Home / Self Care | Admitting: Orthopaedic Surgery

## 2021-04-09 ENCOUNTER — Encounter (HOSPITAL_COMMUNITY): Admission: RE | Payer: Self-pay | Source: Home / Self Care

## 2021-04-09 SURGERY — ARTHROPLASTY, KNEE, TOTAL
Anesthesia: Spinal | Site: Knee | Laterality: Left

## 2021-04-15 ENCOUNTER — Other Ambulatory Visit: Payer: Self-pay | Admitting: Family Medicine

## 2021-04-20 NOTE — Telephone Encounter (Signed)
Patient surgery have been postponed for 2-3 months out due to elevated HA1C levels.

## 2021-04-23 ENCOUNTER — Ambulatory Visit (INDEPENDENT_AMBULATORY_CARE_PROVIDER_SITE_OTHER): Payer: Medicare Other | Admitting: Internal Medicine

## 2021-04-23 ENCOUNTER — Other Ambulatory Visit: Payer: Self-pay

## 2021-04-23 ENCOUNTER — Encounter: Payer: Self-pay | Admitting: Internal Medicine

## 2021-04-23 VITALS — BP 110/70 | HR 92 | Ht 74.0 in | Wt 247.0 lb

## 2021-04-23 DIAGNOSIS — E1122 Type 2 diabetes mellitus with diabetic chronic kidney disease: Secondary | ICD-10-CM

## 2021-04-23 DIAGNOSIS — N1832 Chronic kidney disease, stage 3b: Secondary | ICD-10-CM

## 2021-04-23 DIAGNOSIS — E1142 Type 2 diabetes mellitus with diabetic polyneuropathy: Secondary | ICD-10-CM | POA: Diagnosis not present

## 2021-04-23 DIAGNOSIS — Z794 Long term (current) use of insulin: Secondary | ICD-10-CM

## 2021-04-23 DIAGNOSIS — E1165 Type 2 diabetes mellitus with hyperglycemia: Secondary | ICD-10-CM | POA: Diagnosis not present

## 2021-04-23 MED ORDER — RYBELSUS 7 MG PO TABS: 7.0000 mg | ORAL_TABLET | Freq: Every day | ORAL | 6 refills | Status: DC

## 2021-04-23 NOTE — Progress Notes (Signed)
Name: [name]  Age/ Sex: 65 y.o., male   MRN/ DOB: [id number], 27-May-1956     PCP: [name], MD   Reason for Endocrinology Evaluation: Type 2 Diabetes Mellitus  Initial Endocrine Consultative Visit: 02/23/2020    PATIENT IDENTIFIER: Mr. [name] is a 65 y.o. male with a past medical history of HTN, T2DM and Hx of PE. The patient has followed with Endocrinology clinic since 02/23/2020 for consultative assistance with management of his diabetes.  DIABETIC HISTORY:  Mr. [name] was diagnosed with T2DM in 2009, metformin- stopped 12/2019 due to AKI as well as Glyburide. Has been on insulin mix . His hemoglobin A1c has ranged from 7.6% ,peaking at 8.6% in 2021   On her initial visit to  [place] clinic her A1c was 8.6% , she was on Humulin MIx which we adjusted    Moved from [place]  ~ early 2021 SUBJECTIVE:   During the last visit (10/02/2020): A1c 7.2 %. Adjusted Humulin Mix      Today (04/23/2021): Mr. [name] is here for a follow up on diabetes management.  He checks his blood sugars 2- 3 times daily. The patient has had hypoglycemic episodes since the last clinic visit.  Denies nausea or diarrhea.   He self discontinued two of his BP meds ~ 4 days ago ( losartan and Amlodipine) He is under the impression that these meds and Tramadol cause his BP to drop too low based on an ED visit a year ago with systolic 80 mmhg.   Was supposed to have a left knee replacement but this was canceled due to hyperglycemia   HOME DIABETES REGIMEN:  Humulin Mix 50 units with Breakfast and 52 units with Supper- takes between 50- 55 units     Statin: yes ACE-I/ARB: Intolerant to Lisinopril to AKI    METER DOWNLOAD SUMMARY: 2/24-3/25/2022 Average Number Tests/Day = 0.7 Overall Mean FS Glucose = 105 Standard Deviation = 34  BG Ranges: Low = 69 High = 211    Hypoglycemic Events/30 Days: BG < 50 = 0 Episodes of symptomatic severe hypoglycemia =0    DIABETIC  COMPLICATIONS: Microvascular complications:  CKD, neuropathy Denies: retinopathy Last eye exam: Completed 04/2020   Macrovascular complications:   Denies: CAD, PVD, CVA      HISTORY:  Past Medical History:  Past Medical History:  Diagnosis Date   Arthritis    Chronic kidney disease    reduced kidney function. Stage III   Diabetes mellitus without complication (HCC)    Dyspnea    On exertion   History of kidney stones    History of pulmonary embolus (PE)    Hypertension    Lipidemia    Prostate enlargement    Sleep apnea    Past Surgical History:  Past Surgical History:  Procedure Laterality Date   CYSTOSCOPY W/ URETERAL STENT PLACEMENT Left 10/08/2020   Procedure: CYSTOSCOPY WITH RETROGRADE PYELOGRAM/URETERAL STENT PLACEMENT;  Surgeon: Irine Seal, MD;  Location: [facility];  Service: Urology;  Laterality: Left;   CYSTOSCOPY/URETEROSCOPY/HOLMIUM LASER/STENT PLACEMENT Right 06/07/2020   Procedure: CYSTOSCOPY/URETEROSCOPY/HOLMIUM LASER/STENT PLACEMENT;  Surgeon: Lucas Mallow, MD;  Location: [facility];  Service: Urology;  Laterality: Right;   CYSTOSCOPY/URETEROSCOPY/HOLMIUM LASER/STENT PLACEMENT Left 10/19/2020   Procedure: CYSTOSCOPY LEFT URETEROSCOPY/HOLMIUM LASER/STENT EXCHANGE;  Surgeon: Irine Seal, MD;  Location: [facility];  Service: Urology;  Laterality: Left;   Social History:  reports that he has never smoked. He has never used smokeless tobacco. He reports current alcohol use of about  1.0 standard drink of alcohol per week. He reports that he does not use drugs. Family History:  Family History  Problem Relation Age of Onset   Cancer Mother    Heart disease Father      HOME MEDICATIONS: Allergies as of 04/23/2021   No Known Allergies      Medication List        Accurate as of April 23, 2021  4:12 PM. If you have any questions, ask your nurse or doctor.          Accu-Chek Aviva device by Other route. Use as instructed   Accu-Chek Guide test strip Generic  drug: glucose blood Use to check blood sugar 3 times daily.E11.65   Accu-Chek Softclix Lancets lancets Use as instructed to check blood sugar 3 times daily Dx is E11.65   acetaminophen 650 MG CR tablet Commonly known as: TYLENOL Take 650-1,300 mg by mouth every 8 (eight) hours as needed for pain.   amLODipine 10 MG tablet Commonly known as: NORVASC Take 1 tablet (10 mg total) by mouth daily.   aspirin EC 81 MG tablet Take 81 mg by mouth daily.   BD Insulin Syringe U/F 31G X 5/16" 1 ML Misc Generic drug: Insulin Syringe-Needle U-100 USE AS DIRECTED   cilostazol 100 MG tablet Commonly known as: PLETAL Take 1 tablet (100 mg total) by mouth 2 (two) times daily.   diclofenac Sodium 1 % Gel Commonly known as: VOLTAREN Apply 1 application topically 4 (four) times daily as needed (pain).   gabapentin 300 MG capsule Commonly known as: NEURONTIN TAKE 1 CAPSULE BY MOUTH 4 TIMES DAILY. What changed:  how much to take how to take this when to take this additional instructions   HumuLIN 70/30 (70-30) 100 UNIT/ML injection Generic drug: insulin NPH-regular Human Inject 50 Units into the skin daily before breakfast AND 52 Units daily before supper. What changed: See the new instructions.   Insulin Syringes (Disposable) U-100 1 ML Misc 1 Device by Does not apply route as directed.   losartan 50 MG tablet Commonly known as: COZAAR Take 50 mg by mouth daily.   lovastatin 40 MG tablet Commonly known as: MEVACOR TAKE 1 TABLET (40 MG TOTAL) BY MOUTH AT BEDTIME. **NEEDS APPT BEFORE NEXT REFILL**   methocarbamol 500 MG tablet Commonly known as: ROBAXIN TAKE 1 TABLET (500 MG TOTAL) BY MOUTH EVERY 8 (EIGHT) HOURS AS NEEDED FOR MUSCLE SPASMS.   montelukast 10 MG tablet Commonly known as: SINGULAIR Take 1 tablet (10 mg total) by mouth at bedtime.   Rybelsus 7 MG Tabs Generic drug: Semaglutide Take 7 mg by mouth daily. Started by: Dorita Sciara, MD   traMADol 50 MG  tablet Commonly known as: ULTRAM Take 50 mg by mouth every 12 (twelve) hours as needed for moderate pain.   warfarin 5 MG tablet Commonly known as: COUMADIN Take as directed by the anticoagulation clinic. If you are unsure how to take this medication, talk to your nurse or doctor. Original instructions: Take 1 tablet (5 mg total) by mouth daily. Take as directed. What changed:  how much to take when to take this additional instructions         OBJECTIVE:   Vital Signs: BP 110/70   Pulse 92   Ht 6\' 2"  (1.88 m)   Wt 247 lb (112 kg)   SpO2 94%   BMI 31.71 kg/m   Wt Readings from Last 3 Encounters:  04/23/21 247 lb (112 kg)  03/30/21 248  lb 6 oz (112.7 kg)  01/26/21 249 lb 9.6 oz (113.2 kg)     Exam: General: Pt appears well and is in NAD  Lungs: Clear with good BS bilat with no rales, rhonchi, or wheezes  Heart: RRR   Extremities: 1+  pretibial edema.   Neuro: MS is good with appropriate affect, pt is alert and Ox3     DM foot exam: 02/23/2020   The skin of the feet is without sores or ulcerations. Toe nails thickened The pedal pulses are 2+ on right and 2+ on left. The sensation is decreased to a screening 5.07, 10 gram monofilament bilaterally     DATA REVIEWED:  Lab Results  Component Value Date   HGBA1C 7.9 (H) 03/30/2021   HGBA1C 7.0 (A) 01/26/2021   HGBA1C 7.2 (A) 09/22/2020   Lab Results  Component Value Date   MICROALBUR 4.9 (H) 08/28/2020   CREATININE 1.77 (H) 03/30/2021   Lab Results  Component Value Date   MICRALBCREAT 4.4 08/28/2020     Lab Results  Component Value Date   CHOL 156 12/22/2019   HDL 41.40 12/22/2019   LDLDIRECT 78.0 08/28/2020   TRIG 251.0 (H) 12/22/2019   CHOLHDL 4 12/22/2019         ASSESSMENT / PLAN / RECOMMENDATIONS:   1) Type 2 Diabetes Mellitus, Sub- Optimally controlled, With Neuropathic and CKD III complications - Most recent A1c of 7.9%. Goal A1c < 7.0 %.    -His A1c has increased from 7.2%, I have  encouraged him to check his glucose at home, I have reminded him to bring his meter on the next visit  - We had  discussed add- on therapy with GLP1 agonists in the past and again today, he agreed to trying Rybelsus, cautioned against GI side effects.  He was given a 30-day supply of 3 mg tablets.  No prior history of pancreatitis -He will have repeat A1c in 8 weeks  MEDICATIONS:  -Start Rybelsus 3 mg daily - Continue Humulin Mix  50 units Before breakfast and 52 units before supper    EDUCATION / INSTRUCTIONS: BG monitoring instructions: Patient is instructed to check his blood sugars 2 times a day, before breakfast and supper. Call [facility] if: BG persistently < 70 I reviewed the Rule of 15 for the treatment of hypoglycemia in detail with the patient. Literature supplied.   2) Diabetic complications:  Eye: Does not have known diabetic retinopathy.  Neuro/ Feet: Does  have known diabetic peripheral neuropathy .  Renal: Patient does  have known baseline CKD. He  Is on ARB.      F/U in 4 months     Signed electronically by: Mack Guise, MD  [facility]  [facility] [address] [place], [place] [address] Phone: [phone] FAX: [phone]   CC: [name], MD [place] [address] Phone: [phone]  Fax: [phone]  Return to Endocrinology clinic as below: Future Appointments  Date Time Provider [place]  05/31/2021  2:00 PM Ubaldo Glassing, Amy, NP [facility] None  06/19/2021  2:00 PM [facility] None  07/10/2021 10:30 AM [name], MD [facility]  08/29/2021  3:00 PM Kacy Conely, Melanie Crazier, MD [facility] None

## 2021-04-23 NOTE — Patient Instructions (Addendum)
-   Start Rybelsus 3 mg, 1 tablet before Breakfast  - Humulin Mix 50 units Before breakfast and 52 units before supper     HOW TO TREAT LOW BLOOD SUGARS (Blood sugar LESS THAN 70 MG/DL) Please follow the RULE OF 15 for the treatment of hypoglycemia treatment (when your (blood sugars are less than 70 mg/dL)   STEP 1: Take 15 grams of carbohydrates when your blood sugar is low, which includes:  3-4 GLUCOSE TABS  OR 3-4 OZ OF JUICE OR REGULAR SODA OR ONE TUBE OF GLUCOSE GEL    STEP 2: RECHECK blood sugar in 15 MINUTES STEP 3: If your blood sugar is still low at the 15 minute recheck --> then, go back to STEP 1 and treat AGAIN with another 15 grams of carbohydrates.

## 2021-04-24 ENCOUNTER — Encounter: Payer: Medicare Other | Admitting: Orthopaedic Surgery

## 2021-05-22 ENCOUNTER — Other Ambulatory Visit: Payer: Self-pay | Admitting: Internal Medicine

## 2021-05-25 ENCOUNTER — Encounter: Payer: Self-pay | Admitting: Internal Medicine

## 2021-05-31 ENCOUNTER — Ambulatory Visit (INDEPENDENT_AMBULATORY_CARE_PROVIDER_SITE_OTHER): Payer: Medicare Other | Admitting: Family Medicine

## 2021-05-31 ENCOUNTER — Other Ambulatory Visit: Payer: Self-pay

## 2021-05-31 ENCOUNTER — Encounter: Payer: Self-pay | Admitting: Family Medicine

## 2021-05-31 VITALS — BP 124/72 | HR 91 | Ht 73.5 in | Wt 242.5 lb

## 2021-05-31 DIAGNOSIS — Z9989 Dependence on other enabling machines and devices: Secondary | ICD-10-CM | POA: Diagnosis not present

## 2021-05-31 DIAGNOSIS — G4733 Obstructive sleep apnea (adult) (pediatric): Secondary | ICD-10-CM

## 2021-05-31 NOTE — Progress Notes (Addendum)
PATIENT: [name] DOB: June 24, 1956  REASON FOR VISIT: follow up HISTORY FROM: patient  Chief Complaint  Patient presents with   Follow-up    Follow up Cpap Room 13, alone in room      HISTORY OF PRESENT ILLNESS: 05/31/2021 ALL: Ed returns for follow up for OSA on CPAP. He is well with compliance. He is using a large FFM and feels he is comfortable. He does not feel he can tolerate it much tighter. He does have facial hair that could contribute to leak. He is sleeping well. He does feel better rested when using CPAP.     09/21/2020 ALL:  [name] is a 65 y.o. male here today for follow up for OSA on CPAP. Sleep study 05/16/2020 revealed "overall mild obstructive sleep apnea, severe during REM sleep, with a total AHI of 11.6/hour, REM AHI of 35.7/hour, and O2 nadir of 77%".  He reports that he has done fairly well with CPAP therapy.  He does note improvement in sleep quality and daytime energy after starting therapy.  He continues to notice significant leak with his mask.  He feels that this is due to his facial hair.  He is currently using a full facemask.  Compliance report dated 08/21/2020 through 09/19/2020 reveals that he used CPAP 30 of the past 30 days for compliance of 100%.  Use CPAP greater than 4 hours 30 of the past 30 days for compliance of 100%.  Average usage on days used was 8 hours and 5 minutes.  Residual AHI was 9.2 on 6 to 12 cm of water and EPR of 3.  There was a significant leak noted in the 95th percentile of 81.8 L/min.   HISTORY: (copied from [name] previous note)  Dear [name]. Ethelene Glass,    I saw your patient, [name], upon your kind request in my sleep clinic today for initial consultation of his sleep disorder, in particular, concern for underlying obstructive sleep apnea.  The patient is unaccompanied today.  As you know, [name] is a 64 year old right-handed gentleman with an underlying medical history of arthritis, diabetes, history of PE,  hypertension, kidney stones, prostate enlargement, hyperlipidemia, and obesity, who reports snoring and excessive daytime somnolence.  I reviewed your office note from 03/30/2020.  His Epworth sleepiness score is 7/24, fatigue severity score is 51 out of 63.  He has fatigue and shortness of breath especially when physically active.  He has chronic knee pain and overall issues with arthritis, chronic back pain.  He takes Robaxin rarely, he takes tramadol rarely.  He is followed by sports medicine.  In April he was hospitalized for day for dehydration and a syncopal spell.  He has also seen urology recently for his kidney stone.  He lives with his girlfriend, has 2 dogs in the household, no kids.  They do have a TV in the bedroom and watches the news at night and has a TV on a timer.  Bedtime is generally around 11 and rise time between 7 and 8 AM.  He does not have night to night nocturia.  He does not typically wake up with a headache.  He denies any telltale symptoms of restless leg syndrome.  He has some trouble falling asleep and often difficulty staying asleep, this has become worse over the past 2 years.  In the past year he has gained about 15 pounds.  He likes to drink coffee and soda, coffee typically 16 ounce daily and soda several servings  per day.  He rarely drinks alcohol and is a non-smoker, is retired as an Event organiser.     REVIEW OF SYSTEMS: Out of a complete 14 system review of symptoms, the patient complains only of the following symptoms, none and all other reviewed systems are negative.   ALLERGIES: No Known Allergies   HOME MEDICATIONS: Outpatient Medications Prior to Visit  Medication Sig Dispense Refill   ACCU-CHEK GUIDE test strip USE TO CHECK BLOOD SUGAR 3 TIMES DAILY.E11.65 100 strip 12   Accu-Chek Softclix Lancets lancets Use as instructed to check blood sugar 3 times daily Dx is E11.65 100 each 2   acetaminophen (TYLENOL) 650 MG CR tablet Take 650-1,300 mg by mouth  every 8 (eight) hours as needed for pain.     amLODipine (NORVASC) 10 MG tablet Take 1 tablet (10 mg total) by mouth daily. 90 tablet 3   aspirin EC 81 MG tablet Take 81 mg by mouth daily.     BD INSULIN SYRINGE U/F 31G X 5/16" 1 ML MISC USE AS DIRECTED 100 each 11   Blood Glucose Monitoring Suppl (ACCU-CHEK AVIVA) device by Other route. Use as instructed     cilostazol (PLETAL) 100 MG tablet Take 1 tablet (100 mg total) by mouth 2 (two) times daily. 180 tablet 1   diclofenac Sodium (VOLTAREN) 1 % GEL Apply 1 application topically 4 (four) times daily as needed (pain).     gabapentin (NEURONTIN) 300 MG capsule TAKE 1 CAPSULE BY MOUTH 4 TIMES DAILY. 120 capsule 1   insulin NPH-regular Human (HUMULIN 70/30) (70-30) 100 UNIT/ML injection Inject 50 Units into the skin daily before breakfast AND 52 Units daily before supper. 40 mL 3   Insulin Syringes, Disposable, U-100 1 ML MISC 1 Device by Does not apply route as directed. 100 each 11   losartan (COZAAR) 50 MG tablet Take 50 mg by mouth daily.     lovastatin (MEVACOR) 40 MG tablet TAKE 1 TABLET (40 MG TOTAL) BY MOUTH AT BEDTIME. **NEEDS APPT BEFORE NEXT REFILL** 90 tablet 0   methocarbamol (ROBAXIN) 500 MG tablet TAKE 1 TABLET (500 MG TOTAL) BY MOUTH EVERY 8 (EIGHT) HOURS AS NEEDED FOR MUSCLE SPASMS. 30 tablet 0   montelukast (SINGULAIR) 10 MG tablet Take 1 tablet (10 mg total) by mouth at bedtime. 90 tablet 1   Semaglutide (RYBELSUS) 7 MG TABS Take 7 mg by mouth daily. 30 tablet 6   traMADol (ULTRAM) 50 MG tablet Take 50 mg by mouth every 12 (twelve) hours as needed for moderate pain.     warfarin (COUMADIN) 5 MG tablet Take 1 tablet (5 mg total) by mouth daily. Take as directed. (Patient taking differently: Take 5-7.5 mg by mouth See admin instructions. Take 7.5 mg on Thurs and Sat, take 5 mg on Sun, Mon, Tue, Wed, and Fri) 90 tablet 2   No facility-administered medications prior to visit.    PAST MEDICAL HISTORY: Past Medical History:   Diagnosis Date   Arthritis    Chronic kidney disease    reduced kidney function. Stage III   Diabetes mellitus without complication (HCC)    Dyspnea    On exertion   History of kidney stones    History of pulmonary embolus (PE)    Hypertension    Lipidemia    Prostate enlargement    Sleep apnea     PAST SURGICAL HISTORY: Past Surgical History:  Procedure Laterality Date   CYSTOSCOPY W/ URETERAL STENT PLACEMENT Left 10/08/2020   Procedure:  CYSTOSCOPY WITH RETROGRADE PYELOGRAM/URETERAL STENT PLACEMENT;  Surgeon: Irine Seal, MD;  Location: [facility];  Service: Urology;  Laterality: Left;   CYSTOSCOPY/URETEROSCOPY/HOLMIUM LASER/STENT PLACEMENT Right 06/07/2020   Procedure: CYSTOSCOPY/URETEROSCOPY/HOLMIUM LASER/STENT PLACEMENT;  Surgeon: Lucas Mallow, MD;  Location: [facility];  Service: Urology;  Laterality: Right;   CYSTOSCOPY/URETEROSCOPY/HOLMIUM LASER/STENT PLACEMENT Left 10/19/2020   Procedure: CYSTOSCOPY LEFT URETEROSCOPY/HOLMIUM LASER/STENT EXCHANGE;  Surgeon: Irine Seal, MD;  Location: [facility];  Service: Urology;  Laterality: Left;    FAMILY HISTORY: Family History  Problem Relation Age of Onset   Cancer Mother    Heart disease Father     SOCIAL HISTORY: Social History   Socioeconomic History   Marital status: Significant Other    Spouse name: Not on file   Number of children: Not on file   Years of education: Not on file   Highest education level: Not on file  Occupational History   Occupation: retired  Tobacco Use   Smoking status: Never   Smokeless tobacco: Never  Vaping Use   Vaping Use: Never used  Substance and Sexual Activity   Alcohol use: Yes    Alcohol/week: 1.0 standard drink    Types: 1 Cans of beer per week    Comment: social   Drug use: Never   Sexual activity: Yes  Other Topics Concern   Not on file  Social History Narrative   Lives with Friend   Right handed   Drinks 1-2 cups caffeine daily   Social Determinants of Health   Financial  Resource Strain: Not on file  Food Insecurity: Not on file  Transportation Needs: Not on file  Physical Activity: Not on file  Stress: Not on file  Social Connections: Not on file  Intimate Partner Violence: Not on file     PHYSICAL EXAM  Vitals:   05/31/21 1410  BP: 124/72  Pulse: 91  Weight: 242 lb 8 oz (110 kg)  Height: 6' 1.5" (1.867 m)    Body mass index is 31.56 kg/m.  Generalized: Well developed, in no acute distress  Cardiology: normal rate and rhythm, no murmur noted Respiratory: clear to auscultation bilaterally  Neurological examination  Mentation: Alert oriented to time, place, history taking. Follows all commands speech and language fluent Cranial nerve II-XII: Pupils were equal round reactive to light. Extraocular movements were full, visual field were full  Motor: The motor testing reveals 5 over 5 strength of all 4 extremities. Good symmetric motor tone is noted throughout.  Gait and station: arthritic gait, stable without assistive device.    DIAGNOSTIC DATA (LABS, IMAGING, TESTING) - I reviewed patient records, labs, notes, testing and imaging myself where available.  No flowsheet data found.   Lab Results  Component Value Date   WBC 7.5 03/30/2021   HGB 13.3 03/30/2021   HCT 41.1 03/30/2021   MCV 91.7 03/30/2021   PLT 236 03/30/2021      Component Value Date/Time   NA 139 03/30/2021 1400   K 4.6 03/30/2021 1400   CL 110 03/30/2021 1400   CO2 21 (L) 03/30/2021 1400   GLUCOSE 189 (H) 03/30/2021 1400   BUN 20 03/30/2021 1400   CREATININE 1.77 (H) 03/30/2021 1400   CALCIUM 8.9 03/30/2021 1400   PROT 7.3 03/30/2021 1400   ALBUMIN 3.7 03/30/2021 1400   AST 18 03/30/2021 1400   ALT 20 03/30/2021 1400   ALKPHOS 69 03/30/2021 1400   BILITOT 0.8 03/30/2021 1400   GFRNONAA 42 (L) 03/30/2021 1400   GFRAA  23 (L) 06/08/2020 0941   Lab Results  Component Value Date   CHOL 156 12/22/2019   HDL 41.40 12/22/2019   LDLDIRECT 78.0 08/28/2020   TRIG  251.0 (H) 12/22/2019   CHOLHDL 4 12/22/2019   Lab Results  Component Value Date   HGBA1C 7.9 (H) 03/30/2021   No results found for: [id number] Lab Results  Component Value Date   TSH 1.344 02/09/2020     ASSESSMENT AND PLAN 65 y.o. year old male  has a past medical history of Arthritis, Chronic kidney disease, Diabetes mellitus without complication ([place]), Dyspnea, History of kidney stones, History of pulmonary embolus (PE), Hypertension, Lipidemia, Prostate enlargement, and Sleep apnea. here with     ICD-10-CM   1. OSA on CPAP  G47.33 For home use only DME continuous positive airway pressure (CPAP)   Z99.89 For home use only DME continuous positive airway pressure (CPAP)       [name] is doing well on CPAP therapy. Compliance report reveals excellent daily and 4-hour compliance.  Residual AHI has improved and now 4 events per hour.  He continues to have a significant leak.  I will send orders today for a mask refitting. He did not have a mask refitting as recommended at last visit. He will call me if he is unable to reach [facility] for refitting. I will repeat download in 2 months to assess AHI following mask refitting.  He was encouraged to continue using CPAP nightly and for greater than 4 hours each night. We will update supply orders as indicated. Risks of untreated sleep apnea review and education materials provided. Healthy lifestyle habits encouraged. He will follow up in 6 months, sooner if needed. He verbalizes understanding and agreement with this plan.    Orders Placed This Encounter  Procedures   For home use only DME continuous positive airway pressure (CPAP)    Supplies    Order Specific Question:   Length of Need    Answer:   Lifetime    Order Specific Question:   Patient has OSA or probable OSA    Answer:   Yes    Order Specific Question:   Is the patient currently using CPAP in the home    Answer:   Yes    Order Specific Question:   Settings    Answer:   Other  see comments    Order Specific Question:   CPAP supplies needed    Answer:   Mask, headgear, cushions, filters, heated tubing and water chamber   For home use only DME continuous positive airway pressure (CPAP)    Mask refitting. Using FFM, has facial hair, may do well with nasal pillow versus nasal mask with chin strap.    Order Specific Question:   Length of Need    Answer:   Lifetime    Order Specific Question:   Patient has OSA or probable OSA    Answer:   Yes    Order Specific Question:   Is the patient currently using CPAP in the home    Answer:   Yes    Order Specific Question:   Settings    Answer:   Other see comments    Order Specific Question:   CPAP supplies needed    Answer:   Mask, headgear, cushions, filters, heated tubing and water chamber      No orders of the defined types were placed in this encounter.    Debbora Presto, FNP-C 05/31/2021, 2:34 PM [facility]  [address], [place], [place] [address] [phone]  I reviewed the above note and documentation by the Nurse Practitioner and agree with the history, exam, assessment and plan as outlined above. I was available for consultation. Star Age, MD, PhD [facility])

## 2021-05-31 NOTE — Progress Notes (Signed)
CM sent to [facility] 

## 2021-05-31 NOTE — Patient Instructions (Addendum)
Please continue using your CPAP regularly. While your insurance requires that you use CPAP at least 4 hours each night on 70% of the nights, I recommend, that you not skip any nights and use it throughout the night if you can. Getting used to CPAP and staying with the treatment long term does take time and patience and discipline. Untreated obstructive sleep apnea when it is moderate to severe can have an adverse impact on cardiovascular health and raise her risk for heart disease, arrhythmias, hypertension, congestive heart failure, stroke and diabetes. Untreated obstructive sleep apnea causes sleep disruption, nonrestorative sleep, and sleep deprivation. This can have an impact on your day to day functioning and cause daytime sleepiness and impairment of cognitive function, memory loss, mood disturbance, and problems focussing. Using CPAP regularly can improve these symptoms.   We will send supply and mask refitting orders to [facility]. If you do not hear form them in 1-2 weeks, please call. You may do well with nasal pillow versus nasal mask with chin strap.

## 2021-06-01 ENCOUNTER — Other Ambulatory Visit: Payer: Self-pay | Admitting: Family Medicine

## 2021-06-04 ENCOUNTER — Telehealth: Payer: Self-pay

## 2021-06-04 ENCOUNTER — Other Ambulatory Visit: Payer: Self-pay | Admitting: Internal Medicine

## 2021-06-04 ENCOUNTER — Telehealth: Payer: Self-pay | Admitting: Pharmacy Technician

## 2021-06-04 MED ORDER — TRULICITY 1.5 MG/0.5ML ~~LOC~~ SOAJ: 1.5000 mg | SUBCUTANEOUS | 3 refills | Status: DC

## 2021-06-04 NOTE — Telephone Encounter (Signed)
Received PA back from [facility] his PA was denied for [facility], they will pay for TRULICITY . Call and notified pt of this  he is willing  to try Trulicity once a week injection. Advised pt that we will send this to his pharmacy per Dr. Kelton Pillar

## 2021-06-04 NOTE — Telephone Encounter (Signed)
Can you look in to a PA for this patient Rybelsus, Thanks

## 2021-06-04 NOTE — Telephone Encounter (Addendum)
Patient Advocate Encounter   Received notification from [employer] that prior authorization for RYBELSUS is required.   PA submitted on 06/04/2021 Key [id number] Status is DENIED  Rybelsus is denied because it is not on your plan's Drug List (formulary). Medication authorization requires the following: (1) You need to try two (2) of these covered drugs: (a) Bydureon. (b) Trulicity. (2) OR your doctor needs to give [place] specific medical reasons why two (2) of the covered drug(s) are not appropriate for you.   Information given to provider CMA.    [facility] will continue to follow   Ronney Asters, CPhT Patient Advocate [facility] Phone: [phone] Fax:  [phone]

## 2021-06-04 NOTE — Progress Notes (Signed)
Insurance is not covering Rybelsus, but they cover Trulicity or Bydureon   Trulicity will e sent at 1.5 mg weekly    Abby Nena Jordan, MD  [facility]  [facility] [address] [place], [place] [address] Phone: [phone] FAX: [phone]

## 2021-06-05 NOTE — Telephone Encounter (Signed)
Sent in an rx for trulicity for pt his insurance will not cover medication , per [facility] PA.

## 2021-06-19 ENCOUNTER — Other Ambulatory Visit: Payer: Self-pay

## 2021-06-19 ENCOUNTER — Other Ambulatory Visit (INDEPENDENT_AMBULATORY_CARE_PROVIDER_SITE_OTHER): Payer: Medicare Other

## 2021-06-19 DIAGNOSIS — Z794 Long term (current) use of insulin: Secondary | ICD-10-CM | POA: Diagnosis not present

## 2021-06-19 DIAGNOSIS — E1142 Type 2 diabetes mellitus with diabetic polyneuropathy: Secondary | ICD-10-CM

## 2021-06-20 ENCOUNTER — Encounter: Payer: Self-pay | Admitting: Orthopaedic Surgery

## 2021-06-20 LAB — HEMOGLOBIN A1C: Hgb A1c MFr Bld: 5.9 % (ref 4.6–6.5)

## 2021-06-21 ENCOUNTER — Telehealth: Payer: Self-pay | Admitting: Orthopaedic Surgery

## 2021-06-21 NOTE — Telephone Encounter (Signed)
Patient would like to proceed with left total knee.   A1c results are now 5.9.  Ok to secure a date of 08-06-21 for this patient?

## 2021-06-25 ENCOUNTER — Other Ambulatory Visit: Payer: Self-pay

## 2021-06-30 ENCOUNTER — Other Ambulatory Visit: Payer: Self-pay | Admitting: Family Medicine

## 2021-07-07 ENCOUNTER — Other Ambulatory Visit: Payer: Self-pay | Admitting: Family Medicine

## 2021-07-10 ENCOUNTER — Ambulatory Visit (INDEPENDENT_AMBULATORY_CARE_PROVIDER_SITE_OTHER): Payer: Medicare Other | Admitting: Family Medicine

## 2021-07-10 ENCOUNTER — Encounter: Payer: Self-pay | Admitting: Family Medicine

## 2021-07-10 ENCOUNTER — Telehealth: Payer: Self-pay

## 2021-07-10 ENCOUNTER — Other Ambulatory Visit: Payer: Self-pay

## 2021-07-10 VITALS — BP 132/68 | HR 94 | Temp 97.4°F | Ht 73.0 in | Wt 238.2 lb

## 2021-07-10 DIAGNOSIS — M1712 Unilateral primary osteoarthritis, left knee: Secondary | ICD-10-CM

## 2021-07-10 DIAGNOSIS — N1832 Chronic kidney disease, stage 3b: Secondary | ICD-10-CM | POA: Diagnosis not present

## 2021-07-10 DIAGNOSIS — Z0000 Encounter for general adult medical examination without abnormal findings: Secondary | ICD-10-CM

## 2021-07-10 DIAGNOSIS — M25562 Pain in left knee: Secondary | ICD-10-CM

## 2021-07-10 DIAGNOSIS — Z86711 Personal history of pulmonary embolism: Secondary | ICD-10-CM

## 2021-07-10 DIAGNOSIS — Z5181 Encounter for therapeutic drug level monitoring: Secondary | ICD-10-CM

## 2021-07-10 DIAGNOSIS — E7800 Pure hypercholesterolemia, unspecified: Secondary | ICD-10-CM | POA: Diagnosis not present

## 2021-07-10 DIAGNOSIS — Z1211 Encounter for screening for malignant neoplasm of colon: Secondary | ICD-10-CM | POA: Insufficient documentation

## 2021-07-10 DIAGNOSIS — I10 Essential (primary) hypertension: Secondary | ICD-10-CM

## 2021-07-10 DIAGNOSIS — M545 Low back pain, unspecified: Secondary | ICD-10-CM

## 2021-07-10 DIAGNOSIS — G8929 Other chronic pain: Secondary | ICD-10-CM

## 2021-07-10 LAB — URINALYSIS, ROUTINE W REFLEX MICROSCOPIC
Bilirubin Urine: NEGATIVE
Hgb urine dipstick: NEGATIVE
Ketones, ur: NEGATIVE
Leukocytes,Ua: NEGATIVE
Nitrite: NEGATIVE
RBC / HPF: NONE SEEN (ref 0–?)
Specific Gravity, Urine: 1.02 (ref 1.000–1.030)
Total Protein, Urine: NEGATIVE
Urine Glucose: NEGATIVE
Urobilinogen, UA: 0.2 (ref 0.0–1.0)
WBC, UA: NONE SEEN (ref 0–?)
pH: 6 (ref 5.0–8.0)

## 2021-07-10 MED ORDER — TRAMADOL HCL 50 MG PO TABS: 50.0000 mg | ORAL_TABLET | Freq: Two times a day (BID) | ORAL | 0 refills | Status: DC | PRN

## 2021-07-10 NOTE — Telephone Encounter (Signed)
Dr. Ethelene Hal, for coumadin clinic [facility] An will need patient INR ranges that you would like patient in along with any particulars you would like for her to know regarding the patient.

## 2021-07-10 NOTE — Progress Notes (Signed)
Established Patient Office Visit  Subjective:  Patient ID: [name], male    DOB: 09-19-1956  Age: 65 y.o. MRN: [id number]  CC:  Chief Complaint  Patient presents with   Follow-up    3 month follow up, patient states that Trulicity seems to be dropping his blood sugar levels. Would like handicap placement card.     HPI [name] presents for follow-up of hypertension, arthritic pains involving the left knee that is in need of replacement, elevated cholesterol and chronic kidney disease.  In need of left knee replacement but that is been delayed.  Diabetes is under better control through endocrinology.  He is nonfasting today.  Blood pressure is well controlled with amlodipine.  Continues Mevacor for cholesterol.  Continues warfarin and Pletal for history of PE.  Chart review shows that INR levels have been subtherapeutic.  He has not been followed regularly for this.  Past Medical History:  Diagnosis Date   Arthritis    Chronic kidney disease    reduced kidney function. Stage III   Diabetes mellitus without complication (HCC)    Dyspnea    On exertion   History of kidney stones    History of pulmonary embolus (PE)    Hypertension    Lipidemia    Prostate enlargement    Sleep apnea     Past Surgical History:  Procedure Laterality Date   CYSTOSCOPY W/ URETERAL STENT PLACEMENT Left 10/08/2020   Procedure: CYSTOSCOPY WITH RETROGRADE PYELOGRAM/URETERAL STENT PLACEMENT;  Surgeon: Irine Seal, MD;  Location: [facility];  Service: Urology;  Laterality: Left;   CYSTOSCOPY/URETEROSCOPY/HOLMIUM LASER/STENT PLACEMENT Right 06/07/2020   Procedure: CYSTOSCOPY/URETEROSCOPY/HOLMIUM LASER/STENT PLACEMENT;  Surgeon: Lucas Mallow, MD;  Location: [facility];  Service: Urology;  Laterality: Right;   CYSTOSCOPY/URETEROSCOPY/HOLMIUM LASER/STENT PLACEMENT Left 10/19/2020   Procedure: CYSTOSCOPY LEFT URETEROSCOPY/HOLMIUM LASER/STENT EXCHANGE;  Surgeon: Irine Seal, MD;  Location: [facility];  Service:  Urology;  Laterality: Left;    Family History  Problem Relation Age of Onset   Cancer Mother    Heart disease Father     Social History   Socioeconomic History   Marital status: Significant Other    Spouse name: Not on file   Number of children: Not on file   Years of education: Not on file   Highest education level: Not on file  Occupational History   Occupation: retired  Tobacco Use   Smoking status: Never   Smokeless tobacco: Never  Vaping Use   Vaping Use: Never used  Substance and Sexual Activity   Alcohol use: Yes    Alcohol/week: 1.0 standard drink    Types: 1 Cans of beer per week    Comment: social   Drug use: Never   Sexual activity: Yes  Other Topics Concern   Not on file  Social History Narrative   Lives with Friend   Right handed   Drinks 1-2 cups caffeine daily   Social Determinants of Health   Financial Resource Strain: Not on file  Food Insecurity: Not on file  Transportation Needs: Not on file  Physical Activity: Not on file  Stress: Not on file  Social Connections: Not on file  Intimate Partner Violence: Not on file    Outpatient Medications Prior to Visit  Medication Sig Dispense Refill   ACCU-CHEK GUIDE test strip USE TO CHECK BLOOD SUGAR 3 TIMES DAILY.E11.65 100 strip 12   Accu-Chek Softclix Lancets lancets Use as instructed to check blood sugar 3 times daily Dx is  E11.65 100 each 2   acetaminophen (TYLENOL) 650 MG CR tablet Take 650-1,300 mg by mouth every 8 (eight) hours as needed for pain.     amLODipine (NORVASC) 10 MG tablet Take 1 tablet (10 mg total) by mouth daily. 90 tablet 3   aspirin EC 81 MG tablet Take 81 mg by mouth daily.     BD INSULIN SYRINGE U/F 31G X 5/16" 1 ML MISC USE AS DIRECTED 100 each 11   Blood Glucose Monitoring Suppl (ACCU-CHEK AVIVA) device by Other route. Use as instructed     diclofenac Sodium (VOLTAREN) 1 % GEL Apply 1 application topically 4 (four) times daily as needed (pain).     Dulaglutide (TRULICITY)  1.5 LK/4.4WN SOPN Inject 1.5 mg into the skin once a week. 6 mL 3   gabapentin (NEURONTIN) 300 MG capsule TAKE 1 CAPSULE BY MOUTH FOUR TIMES A DAY 120 capsule 0   insulin NPH-regular Human (HUMULIN 70/30) (70-30) 100 UNIT/ML injection Inject 50 Units into the skin daily before breakfast AND 52 Units daily before supper. 40 mL 3   Insulin Syringes, Disposable, U-100 1 ML MISC 1 Device by Does not apply route as directed. 100 each 11   losartan (COZAAR) 50 MG tablet Take 50 mg by mouth daily.     lovastatin (MEVACOR) 40 MG tablet TAKE 1 TABLET (40 MG TOTAL) BY MOUTH AT BEDTIME. **NEEDS APPT BEFORE NEXT REFILL** 90 tablet 0   montelukast (SINGULAIR) 10 MG tablet Take 1 tablet (10 mg total) by mouth at bedtime. 90 tablet 1   warfarin (COUMADIN) 5 MG tablet Take 1 tablet (5 mg total) by mouth daily. Take as directed. (Patient taking differently: Take 5-7.5 mg by mouth See admin instructions. Take 7.5 mg on Thurs and Sat, take 5 mg on Sun, Mon, Tue, Wed, and Fri) 90 tablet 2   cilostazol (PLETAL) 100 MG tablet Take 1 tablet (100 mg total) by mouth 2 (two) times daily. 180 tablet 1   methocarbamol (ROBAXIN) 500 MG tablet TAKE 1 TABLET (500 MG TOTAL) BY MOUTH EVERY 8 (EIGHT) HOURS AS NEEDED FOR MUSCLE SPASMS. 30 tablet 0   traMADol (ULTRAM) 50 MG tablet Take 50 mg by mouth every 12 (twelve) hours as needed for moderate pain.     No facility-administered medications prior to visit.    No Known Allergies  ROS Review of Systems  Constitutional: Negative.   HENT: Negative.    Respiratory: Negative.    Cardiovascular: Negative.   Gastrointestinal: Negative.   Endocrine: Negative for polyphagia and polyuria.  Genitourinary: Negative.   Musculoskeletal:  Positive for arthralgias and gait problem.  Neurological:  Negative for weakness.  Psychiatric/Behavioral: Negative.       Objective:    Physical Exam Vitals and nursing note reviewed.  Constitutional:      General: He is not in acute  distress.    Appearance: Normal appearance. He is not ill-appearing, toxic-appearing or diaphoretic.  HENT:     Head: Normocephalic and atraumatic.     Right Ear: Tympanic membrane, ear canal and external ear normal.     Left Ear: Tympanic membrane, ear canal and external ear normal.     Mouth/Throat:     Mouth: Mucous membranes are moist.     Pharynx: Oropharynx is clear. No oropharyngeal exudate or posterior oropharyngeal erythema.  Eyes:     General: No scleral icterus.       Right eye: No discharge.        Left eye: No  discharge.     Extraocular Movements: Extraocular movements intact.     Conjunctiva/sclera: Conjunctivae normal.     Pupils: Pupils are equal, round, and reactive to light.  Neck:     Vascular: No carotid bruit.  Cardiovascular:     Rate and Rhythm: Normal rate and regular rhythm.  Pulmonary:     Effort: Pulmonary effort is normal.     Breath sounds: Normal breath sounds.  Musculoskeletal:     Cervical back: No rigidity or tenderness.     Right lower leg: No edema.     Left lower leg: No edema.  Lymphadenopathy:     Cervical: No cervical adenopathy.  Skin:    General: Skin is warm and dry.  Neurological:     Mental Status: He is alert and oriented to person, place, and time.  Psychiatric:        Mood and Affect: Mood normal.        Behavior: Behavior normal.    BP 132/68 (BP Location: Right Arm, Patient Position: Sitting, Cuff Size: Normal)   Pulse 94   Temp (!) 97.4 F (36.3 C) (Temporal)   Ht 6\' 1"  (1.854 m)   Wt 238 lb 3.2 oz (108 kg)   SpO2 97%   BMI 31.43 kg/m  Wt Readings from Last 3 Encounters:  07/10/21 238 lb 3.2 oz (108 kg)  05/31/21 242 lb 8 oz (110 kg)  04/23/21 247 lb (112 kg)     Health Maintenance Due  Topic Date Due   Hepatitis C Screening  Never done   TETANUS/TDAP  Never done   COLONOSCOPY (Pts 45-24yrs Insurance coverage will need to be confirmed)  Never done   Zoster Vaccines- Shingrix (1 of 2) Never done   FOOT EXAM   02/22/2021   INFLUENZA VACCINE  06/04/2021   PNA vac Low Risk Adult (2 of 2 - PCV13) 06/08/2021    There are no preventive care reminders to display for this patient.  Lab Results  Component Value Date   TSH 1.344 02/09/2020   Lab Results  Component Value Date   WBC 7.5 03/30/2021   HGB 13.3 03/30/2021   HCT 41.1 03/30/2021   MCV 91.7 03/30/2021   PLT 236 03/30/2021   Lab Results  Component Value Date   NA 139 03/30/2021   K 4.6 03/30/2021   CO2 21 (L) 03/30/2021   GLUCOSE 189 (H) 03/30/2021   BUN 20 03/30/2021   CREATININE 1.77 (H) 03/30/2021   BILITOT 0.8 03/30/2021   ALKPHOS 69 03/30/2021   AST 18 03/30/2021   ALT 20 03/30/2021   PROT 7.3 03/30/2021   ALBUMIN 3.7 03/30/2021   CALCIUM 8.9 03/30/2021   ANIONGAP 8 03/30/2021   GFR 35.92 (L) 08/28/2020   Lab Results  Component Value Date   CHOL 156 12/22/2019   Lab Results  Component Value Date   HDL 41.40 12/22/2019   No results found for: [facility] Lab Results  Component Value Date   TRIG 251.0 (H) 12/22/2019   Lab Results  Component Value Date   CHOLHDL 4 12/22/2019   Lab Results  Component Value Date   HGBA1C 5.9 06/19/2021      Assessment & Plan:   Problem List Items Addressed This Visit       Cardiovascular and Mediastinum   Essential hypertension - Primary   Relevant Orders   CBC   Comprehensive metabolic panel     Genitourinary   Chronic kidney disease, stage 3b ([place])  Relevant Orders   Comprehensive metabolic panel     Other   Elevated cholesterol   Relevant Orders   LDL cholesterol, direct   History of pulmonary embolism   Relevant Orders   Protime-INR   Chronic pain of left knee   Relevant Medications   traMADol (ULTRAM) 50 MG tablet   Midline low back pain without sciatica   Relevant Medications   traMADol (ULTRAM) 50 MG tablet   Encounter for medication monitoring   Other Visit Diagnoses     Healthcare maintenance       Relevant Orders   Urinalysis, Routine w  reflex microscopic   PSA   Osteoarthritis of left knee, unspecified osteoarthritis type       Relevant Medications   traMADol (ULTRAM) 50 MG tablet       Meds ordered this encounter  Medications   traMADol (ULTRAM) 50 MG tablet    Sig: Take 1 tablet (50 mg total) by mouth every 12 (twelve) hours as needed for moderate pain.    Dispense:  30 tablet    Refill:  0    Follow-up: Return in about 2 weeks (around 07/24/2021).   He will follow-up with Dr. Kelton Pillar for adjustment of his Trulicity.  Blood pressure and cholesterol have been controlled with amlodipine and lovastatin.  Tramadol was written to help as needed with severe arthritis of the knee and lower back pain that is chronic.  Rechecking PT with INR.  Believe that he needs to be in a Coumadin clinic or possibly followed by hematology. [name], MD

## 2021-07-12 ENCOUNTER — Ambulatory Visit: Payer: Self-pay

## 2021-07-12 NOTE — Telephone Encounter (Signed)
Contacted pt and he would like to go to the [facility] coumadin clinic location. Scheduled for 9/14 @ 2:30.  Pt reports he is taking 5mg  daily except taking 7.5mg  on Thurs and Sat and he is using 5mg  tablets. He thinks he has not had his INR checked in about 1 year but has been taking warfarin consistently at this dosing.

## 2021-07-18 ENCOUNTER — Ambulatory Visit (INDEPENDENT_AMBULATORY_CARE_PROVIDER_SITE_OTHER): Payer: Medicare Other

## 2021-07-18 ENCOUNTER — Other Ambulatory Visit: Payer: Self-pay

## 2021-07-18 DIAGNOSIS — Z7901 Long term (current) use of anticoagulants: Secondary | ICD-10-CM

## 2021-07-18 LAB — POCT INR: INR: 1.8 — AB (ref 2.0–3.0)

## 2021-07-18 NOTE — Patient Instructions (Addendum)
Pre visit review using our clinic review tool, if applicable. No additional management support is needed unless otherwise documented below in the visit note.  Increase dose today to take 7.5 mg and then continue to take 5 mg daily except take 7.5 mg on Thurs and Sat. Recheck in 4 wks.

## 2021-07-19 ENCOUNTER — Other Ambulatory Visit: Payer: Self-pay | Admitting: Family

## 2021-07-29 ENCOUNTER — Other Ambulatory Visit: Payer: Self-pay | Admitting: Family Medicine

## 2021-07-31 ENCOUNTER — Other Ambulatory Visit: Payer: Self-pay | Admitting: Physician Assistant

## 2021-07-31 ENCOUNTER — Telehealth: Payer: Self-pay | Admitting: Family Medicine

## 2021-07-31 MED ORDER — OXYCODONE-ACETAMINOPHEN 5-325 MG PO TABS: 1.0000 | ORAL_TABLET | Freq: Four times a day (QID) | ORAL | 0 refills | Status: DC | PRN

## 2021-07-31 MED ORDER — CEPHALEXIN 500 MG PO CAPS: 500.0000 mg | ORAL_CAPSULE | Freq: Four times a day (QID) | ORAL | 0 refills | Status: DC

## 2021-07-31 MED ORDER — METHOCARBAMOL 500 MG PO TABS: 500.0000 mg | ORAL_TABLET | Freq: Two times a day (BID) | ORAL | 2 refills | Status: DC | PRN

## 2021-07-31 MED ORDER — DOCUSATE SODIUM 100 MG PO CAPS: 100.0000 mg | ORAL_CAPSULE | Freq: Every day | ORAL | 2 refills | Status: DC | PRN

## 2021-07-31 MED ORDER — ONDANSETRON HCL 4 MG PO TABS: 4.0000 mg | ORAL_TABLET | Freq: Three times a day (TID) | ORAL | 0 refills | Status: DC | PRN

## 2021-07-31 NOTE — Telephone Encounter (Signed)
Mychart message sent.

## 2021-08-01 NOTE — Progress Notes (Signed)
Surgical Instructions    Your procedure is scheduled on 08/06/21.  Report to [facility] Main Entrance "A" at 08:00 A.M., then check in with the Admitting office.  Call this number if you have problems the morning of surgery:  [phone]   If you have any questions prior to your surgery date call [phone]: Open Monday-Friday 8am-4pm    Remember:  Do not eat after midnight the night before your surgery  You may drink clear liquids until 07:00am the morning of your surgery.   Clear liquids allowed are: Water, Non-Citrus Juices (without pulp), Carbonated Beverages, Clear Tea, Black Coffee ONLY (NO MILK, CREAM OR POWDERED CREAMER of any kind), and Gatorade  Patient Instructions  The night before surgery:  No food after midnight. ONLY clear liquids after midnight   The day of surgery (if you have diabetes): Drink ONE (1) 12 oz G2 given to you in your pre admission testing appointment by 07:00am the morning of surgery. Drink in one sitting. Do not sip.  This drink was given to you during your hospital  pre-op appointment visit.  Nothing else to drink after completing the  12 oz bottle of G2.         If you have questions, please contact your surgeon's office.     Take these medicines the morning of surgery with A SIP OF WATER  acetaminophen (TYLENOL)  amLODipine (NORVASC)  gabapentin (NEURONTIN)  IF NEEDED: ondansetron (ZOFRAN) traMADol (ULTRAM)   As of today, STOP taking any Aspirin (unless otherwise instructed by your surgeon) Aleve, Naproxen, Ibuprofen, Motrin, Advil, Goody's, BC's, all herbal medications, fish oil, diclofenac Sodium (VOLTAREN) gel and all vitamins.  Please follow your surgeons instructions in regards on when to stop taking warfarin(coumadin). If no instructions were given, please call your surgeon for further clarification.  WHAT DO I DO ABOUT MY DIABETES MEDICATION?   Do not take oral diabetes medicines (pills) the morning of surgery.  THE NIGHT  BEFORE SURGERY, take 70% (36 units) of insulin NPH-regular Human (HUMULIN 70/30) (70-30).     THE MORNING OF SURGERY, do not take insulin NPH-regular Human (HUMULIN 70/30) (70-30).   The day of surgery, do not take other diabetes injectables, including Byetta (exenatide), Bydureon (exenatide ER), Victoza (liraglutide), or Trulicity (dulaglutide).  If your CBG is greater than 220 mg/dL, you may take  of your sliding scale (correction) dose of insulin.   HOW TO MANAGE YOUR DIABETES BEFORE AND AFTER SURGERY  Why is it important to control my blood sugar before and after surgery? Improving blood sugar levels before and after surgery helps healing and can limit problems. A way of improving blood sugar control is eating a healthy diet by:  Eating less sugar and carbohydrates  Increasing activity/exercise  Talking with your doctor about reaching your blood sugar goals High blood sugars (greater than 180 mg/dL) can raise your risk of infections and slow your recovery, so you will need to focus on controlling your diabetes during the weeks before surgery. Make sure that the doctor who takes care of your diabetes knows about your planned surgery including the date and location.  How do I manage my blood sugar before surgery? Check your blood sugar at least 4 times a day, starting 2 days before surgery, to make sure that the level is not too high or low.  Check your blood sugar the morning of your surgery when you wake up and every 2 hours until you get to the Short Stay unit.  If  your blood sugar is less than 70 mg/dL, you will need to treat for low blood sugar: Do not take insulin. Treat a low blood sugar (less than 70 mg/dL) with  cup of clear juice (cranberry or apple), 4 glucose tablets, OR glucose gel. Recheck blood sugar in 15 minutes after treatment (to make sure it is greater than 70 mg/dL). If your blood sugar is not greater than 70 mg/dL on recheck, call [phone] for further  instructions. Report your blood sugar to the short stay nurse when you get to Short Stay.  If you are admitted to the hospital after surgery: Your blood sugar will be checked by the staff and you will probably be given insulin after surgery (instead of oral diabetes medicines) to make sure you have good blood sugar levels. The goal for blood sugar control after surgery is 80-180 mg/dL.           Do not wear jewelry or makeup Do not wear lotions, powders, perfumes/colognes, or deodorant. Men may shave face and neck. Do not bring valuables to the hospital.  DO Not wear nail polish, gel polish, artificial nails, or any other type of covering on natural nails including finger and toenails. If patients have artificial nails, gel coating, etc. that need to be removed by a nail salon please have this removed prior to surgery or surgery may need to be canceled/delayed if the surgeon/ anesthesia feels like the patient is unable to be adequately monitored.             [facility] is not responsible for any belongings or valuables.  Do NOT Smoke (Tobacco/Vaping)  24 hours prior to your procedure If you use a CPAP at night, you may bring your mask for your overnight stay.   Contacts, glasses, dentures or bridgework may not be worn into surgery, please bring cases for these belongings   For patients admitted to the hospital, discharge time will be determined by your treatment team.   Patients discharged the day of surgery will not be allowed to drive home, and someone needs to stay with them for 24 hours.  NO VISITORS WILL BE ALLOWED IN PRE-OP WHERE PATIENTS GET READY FOR SURGERY.  ONLY 1 SUPPORT PERSON MAY BE PRESENT IN THE WAITING ROOM WHILE YOU ARE IN SURGERY.  IF YOU ARE TO BE ADMITTED, ONCE YOU ARE IN YOUR ROOM YOU WILL BE ALLOWED TWO (2) VISITORS.  Minor children may have two parents present. Special consideration for safety and communication needs will be reviewed on a case by case  basis.  Special instructions:    Oral Hygiene is also important to reduce your risk of infection.  Remember - BRUSH YOUR TEETH THE MORNING OF SURGERY WITH YOUR REGULAR TOOTHPASTE   [facility]- Preparing For Surgery  Before surgery, you can play an important role. Because skin is not sterile, your skin needs to be as free of germs as possible. You can reduce the number of germs on your skin by washing with CHG (chlorahexidine gluconate) Soap before surgery.  CHG is an antiseptic cleaner which kills germs and bonds with the skin to continue killing germs even after washing.     Please do not use if you have an allergy to CHG or antibacterial soaps. If your skin becomes reddened/irritated stop using the CHG.  Do not shave (including legs and underarms) for at least 48 hours prior to first CHG shower. It is OK to shave your face.  Please follow these instructions  carefully.     Shower the NIGHT BEFORE SURGERY and the MORNING OF SURGERY with CHG Soap.   If you chose to wash your hair, wash your hair first as usual with your normal shampoo. After you shampoo, rinse your hair and body thoroughly to remove the shampoo.  Then [employer] and genitals (private parts) with your normal soap and rinse thoroughly to remove soap.  After that Use CHG Soap as you would any other liquid soap. You can apply CHG directly to the skin and wash gently with a scrungie or a clean washcloth.   Apply the CHG Soap to your body ONLY FROM THE NECK DOWN.  Do not use on open wounds or open sores. Avoid contact with your eyes, ears, mouth and genitals (private parts). Wash Face and genitals (private parts)  with your normal soap.   Wash thoroughly, paying special attention to the area where your surgery will be performed.  Thoroughly rinse your body with warm water from the neck down.  DO NOT shower/wash with your normal soap after using and rinsing off the CHG Soap.  Pat yourself dry with a CLEAN TOWEL.  Wear CLEAN  PAJAMAS to bed the night before surgery  Place CLEAN SHEETS on your bed the night before your surgery  DO NOT SLEEP WITH PETS.   Day of Surgery: Take a shower with CHG soap. Wear Clean/Comfortable clothing the morning of surgery Do not apply any deodorants/lotions.   Remember to brush your teeth WITH YOUR REGULAR TOOTHPASTE.   Please read over the following fact sheets that you were given.

## 2021-08-02 ENCOUNTER — Other Ambulatory Visit: Payer: Self-pay

## 2021-08-02 ENCOUNTER — Encounter (HOSPITAL_COMMUNITY)
Admission: RE | Admit: 2021-08-02 | Discharge: 2021-08-02 | Disposition: A | Discharge status: Home / Self Care | Payer: Medicare Other | Source: Ambulatory Visit | Attending: Orthopaedic Surgery | Admitting: Orthopaedic Surgery

## 2021-08-02 ENCOUNTER — Encounter (HOSPITAL_COMMUNITY): Payer: Self-pay

## 2021-08-02 DIAGNOSIS — N183 Chronic kidney disease, stage 3 unspecified: Secondary | ICD-10-CM | POA: Insufficient documentation

## 2021-08-02 DIAGNOSIS — E1122 Type 2 diabetes mellitus with diabetic chronic kidney disease: Secondary | ICD-10-CM | POA: Insufficient documentation

## 2021-08-02 DIAGNOSIS — E785 Hyperlipidemia, unspecified: Secondary | ICD-10-CM | POA: Insufficient documentation

## 2021-08-02 DIAGNOSIS — Z86711 Personal history of pulmonary embolism: Secondary | ICD-10-CM | POA: Diagnosis not present

## 2021-08-02 DIAGNOSIS — Z79899 Other long term (current) drug therapy: Secondary | ICD-10-CM | POA: Insufficient documentation

## 2021-08-02 DIAGNOSIS — Z20822 Contact with and (suspected) exposure to covid-19: Secondary | ICD-10-CM | POA: Insufficient documentation

## 2021-08-02 DIAGNOSIS — Z7901 Long term (current) use of anticoagulants: Secondary | ICD-10-CM | POA: Diagnosis not present

## 2021-08-02 DIAGNOSIS — Z794 Long term (current) use of insulin: Secondary | ICD-10-CM | POA: Insufficient documentation

## 2021-08-02 DIAGNOSIS — Z01812 Encounter for preprocedural laboratory examination: Secondary | ICD-10-CM | POA: Insufficient documentation

## 2021-08-02 DIAGNOSIS — Z86718 Personal history of other venous thrombosis and embolism: Secondary | ICD-10-CM | POA: Diagnosis not present

## 2021-08-02 DIAGNOSIS — I129 Hypertensive chronic kidney disease with stage 1 through stage 4 chronic kidney disease, or unspecified chronic kidney disease: Secondary | ICD-10-CM | POA: Diagnosis not present

## 2021-08-02 DIAGNOSIS — M1712 Unilateral primary osteoarthritis, left knee: Secondary | ICD-10-CM | POA: Diagnosis not present

## 2021-08-02 DIAGNOSIS — G4733 Obstructive sleep apnea (adult) (pediatric): Secondary | ICD-10-CM | POA: Diagnosis not present

## 2021-08-02 DIAGNOSIS — Z7982 Long term (current) use of aspirin: Secondary | ICD-10-CM | POA: Insufficient documentation

## 2021-08-02 HISTORY — DX: Acute embolism and thrombosis of unspecified deep veins of unspecified lower extremity: I82.409

## 2021-08-02 LAB — COMPREHENSIVE METABOLIC PANEL
ALT: 24 U/L (ref 0–44)
AST: 20 U/L (ref 15–41)
Albumin: 3.9 g/dL (ref 3.5–5.0)
Alkaline Phosphatase: 60 U/L (ref 38–126)
Anion gap: 9 (ref 5–15)
BUN: 29 mg/dL — ABNORMAL HIGH (ref 8–23)
CO2: 25 mmol/L (ref 22–32)
Calcium: 9 mg/dL (ref 8.9–10.3)
Chloride: 104 mmol/L (ref 98–111)
Creatinine, Ser: 1.68 mg/dL — ABNORMAL HIGH (ref 0.61–1.24)
GFR, Estimated: 44 mL/min — ABNORMAL LOW (ref >60)
Glucose, Bld: 149 mg/dL — ABNORMAL HIGH (ref 70–99)
Potassium: 4.8 mmol/L (ref 3.5–5.1)
Sodium: 138 mmol/L (ref 135–145)
Total Bilirubin: 0.7 mg/dL (ref 0.3–1.2)
Total Protein: 7.4 g/dL (ref 6.5–8.1)

## 2021-08-02 LAB — SURGICAL PCR SCREEN
MRSA, PCR: NEGATIVE
Staphylococcus aureus: POSITIVE — AB

## 2021-08-02 LAB — CBC WITH DIFFERENTIAL/PLATELET
Abs Immature Granulocytes: 0.03 10*3/uL (ref 0.00–0.07)
Basophils Absolute: 0 10*3/uL (ref 0.0–0.1)
Basophils Relative: 0 %
Eosinophils Absolute: 0.2 10*3/uL (ref 0.0–0.5)
Eosinophils Relative: 2 %
HCT: 46.1 % (ref 39.0–52.0)
Hemoglobin: 14.5 g/dL (ref 13.0–17.0)
Immature Granulocytes: 0 %
Lymphocytes Relative: 20 %
Lymphs Abs: 1.6 10*3/uL (ref 0.7–4.0)
MCH: 29.5 pg (ref 26.0–34.0)
MCHC: 31.5 g/dL (ref 30.0–36.0)
MCV: 93.7 fL (ref 80.0–100.0)
Monocytes Absolute: 0.8 10*3/uL (ref 0.1–1.0)
Monocytes Relative: 10 %
Neutro Abs: 5.6 10*3/uL (ref 1.7–7.7)
Neutrophils Relative %: 68 %
Platelets: 209 10*3/uL (ref 150–400)
RBC: 4.92 MIL/uL (ref 4.22–5.81)
RDW: 13.4 % (ref 11.5–15.5)
WBC: 8.2 10*3/uL (ref 4.0–10.5)
nRBC: 0 % (ref 0.0–0.2)

## 2021-08-02 LAB — URINALYSIS, ROUTINE W REFLEX MICROSCOPIC
Bilirubin Urine: NEGATIVE
Glucose, UA: NEGATIVE mg/dL
Hgb urine dipstick: NEGATIVE
Ketones, ur: NEGATIVE mg/dL
Leukocytes,Ua: NEGATIVE
Nitrite: NEGATIVE
Protein, ur: NEGATIVE mg/dL
Specific Gravity, Urine: 1.011 (ref 1.005–1.030)
pH: 5 (ref 5.0–8.0)

## 2021-08-02 LAB — PROTIME-INR
INR: 2.2 — ABNORMAL HIGH (ref 0.8–1.2)
Prothrombin Time: 24.7 seconds — ABNORMAL HIGH (ref 11.4–15.2)

## 2021-08-02 LAB — GLUCOSE, CAPILLARY: Glucose-Capillary: 163 mg/dL — ABNORMAL HIGH (ref 70–99)

## 2021-08-02 LAB — APTT: aPTT: 39 seconds — ABNORMAL HIGH (ref 24–36)

## 2021-08-02 LAB — SARS CORONAVIRUS 2 (TAT 6-24 HRS): SARS Coronavirus 2: NEGATIVE

## 2021-08-02 NOTE — Progress Notes (Signed)
Surgical Instructions    Your procedure is scheduled on Monday, August 06, 2021 at 10:03 AM.  Report to [facility] Main Entrance "A" at 08:00 A.M., then check in with the Admitting office.  Call this number if you have problems the morning of surgery:  [phone]   If you have any questions prior to your surgery date call [phone]: Open Monday-Friday 8am-4pm    Remember:  Do not eat after midnight the night before your surgery  You may drink clear liquids until 7:00am the morning of your surgery.   Clear liquids allowed are: Water, Non-Citrus Juices (without pulp), Carbonated Beverages, Clear Tea, Black Coffee ONLY (NO MILK, CREAM OR POWDERED CREAMER of any kind), and Gatorade  Please complete your G2 that was provided to you by 7:00 AM the morning of surgery.  Please, if able, drink it in one setting. DO NOT SIP.     Take these medicines the morning of surgery with A SIP OF WATER  acetaminophen (TYLENOL)  amLODipine (NORVASC)  gabapentin (NEURONTIN)  IF NEEDED: ondansetron (ZOFRAN) traMADol (ULTRAM)   As of today, STOP taking any Aleve, Naproxen, Ibuprofen, Motrin, Advil, Goody's, BC's, all herbal medications, fish oil, diclofenac Sodium (VOLTAREN) gel and all vitamins.  Please follow your surgeons instructions in regards on when to stop taking warfarin (COUMADIN) and Aspirin. If no instructions were given, please call your surgeon for further clarification.  WHAT DO I DO ABOUT MY DIABETES MEDICATION?  THE NIGHT BEFORE SURGERY, take 36 units of insulin NPH-regular Human (HUMULIN 70/30) (70-30).   THE MORNING OF SURGERY, do not take insulin NPH-regular Human (HUMULIN 70/30) (70-30).   The day of surgery, do not take Trulicity (dulaglutide).   HOW TO MANAGE YOUR DIABETES BEFORE AND AFTER SURGERY  Why is it important to control my blood sugar before and after surgery? Improving blood sugar levels before and after surgery helps healing and can limit problems. A way  of improving blood sugar control is eating a healthy diet by:  Eating less sugar and carbohydrates  Increasing activity/exercise  Talking with your doctor about reaching your blood sugar goals High blood sugars (greater than 180 mg/dL) can raise your risk of infections and slow your recovery, so you will need to focus on controlling your diabetes during the weeks before surgery. Make sure that the doctor who takes care of your diabetes knows about your planned surgery including the date and location.  How do I manage my blood sugar before surgery? Check your blood sugar at least 4 times a day, starting 2 days before surgery, to make sure that the level is not too high or low.  Check your blood sugar the morning of your surgery when you wake up and every 2 hours until you get to the Short Stay unit.  If your blood sugar is less than 70 mg/dL, you will need to treat for low blood sugar: Do not take insulin. Treat a low blood sugar (less than 70 mg/dL) with  cup of clear juice (cranberry or apple), 4 glucose tablets, OR glucose gel. Recheck blood sugar in 15 minutes after treatment (to make sure it is greater than 70 mg/dL). If your blood sugar is not greater than 70 mg/dL on recheck, call [phone] for further instructions. Report your blood sugar to the short stay nurse when you get to Short Stay.  If you are admitted to the hospital after surgery: Your blood sugar will be checked by the staff and you will probably be given  insulin after surgery (instead of oral diabetes medicines) to make sure you have good blood sugar levels. The goal for blood sugar control after surgery is 80-180 mg/dL.           Do not wear jewelry Do not wear lotions, powders, colognes, or deodorant. Men may shave face and neck. Do not bring valuables to the hospital.             [facility] is not responsible for any belongings or valuables.  Do NOT Smoke (Tobacco/Vaping)  24 hours prior to your procedure If  you use a CPAP at night, you may bring your mask for your overnight stay.   Contacts, glasses, dentures or bridgework may not be worn into surgery, please bring cases for these belongings   For patients admitted to the hospital, discharge time will be determined by your treatment team.   Patients discharged the day of surgery will not be allowed to drive home, and someone needs to stay with them for 24 hours.  NO VISITORS WILL BE ALLOWED IN PRE-OP WHERE PATIENTS GET READY FOR SURGERY.  ONLY 1 SUPPORT PERSON MAY BE PRESENT IN THE WAITING ROOM WHILE YOU ARE IN SURGERY.  IF YOU ARE TO BE ADMITTED, ONCE YOU ARE IN YOUR ROOM YOU WILL BE ALLOWED TWO (2) VISITORS.  Minor children may have two parents present. Special consideration for safety and communication needs will be reviewed on a case by case basis.  Special instructions:    Oral Hygiene is also important to reduce your risk of infection.  Remember - BRUSH YOUR TEETH THE MORNING OF SURGERY WITH YOUR REGULAR TOOTHPASTE   [place]- Preparing For Surgery  Before surgery, you can play an important role. Because skin is not sterile, your skin needs to be as free of germs as possible. You can reduce the number of germs on your skin by washing with CHG (chlorahexidine gluconate) Soap before surgery.  CHG is an antiseptic cleaner which kills germs and bonds with the skin to continue killing germs even after washing.     Please do not use if you have an allergy to CHG or antibacterial soaps. If your skin becomes reddened/irritated stop using the CHG.  Do not shave (including legs and underarms) for at least 48 hours prior to first CHG shower. It is OK to shave your face.  Please follow these instructions carefully.     Shower the NIGHT BEFORE SURGERY and the MORNING OF SURGERY with CHG Soap.   If you chose to wash your hair, wash your hair first as usual with your normal shampoo. After you shampoo, rinse your hair and body thoroughly to remove  the shampoo.  Then [employer] and genitals (private parts) with your normal soap and rinse thoroughly to remove soap.  After that Use CHG Soap as you would any other liquid soap. You can apply CHG directly to the skin and wash gently with a scrungie or a clean washcloth.   Apply the CHG Soap to your body ONLY FROM THE NECK DOWN.  Do not use on open wounds or open sores. Avoid contact with your eyes, ears, mouth and genitals (private parts). Wash Face and genitals (private parts)  with your normal soap.   Wash thoroughly, paying special attention to the area where your surgery will be performed.  Thoroughly rinse your body with warm water from the neck down.  DO NOT shower/wash with your normal soap after using and rinsing off the CHG Soap.  Pat yourself dry with a CLEAN TOWEL.  Wear CLEAN PAJAMAS to bed the night before surgery  Place CLEAN SHEETS on your bed the night before your surgery  DO NOT SLEEP WITH PETS.   Day of Surgery: Take a shower with CHG soap. Wear Clean/Comfortable clothing the morning of surgery Do not apply any deodorants/lotions.   Remember to brush your teeth WITH YOUR REGULAR TOOTHPASTE.   Please read over the following fact sheets that you were given.

## 2021-08-02 NOTE — Progress Notes (Signed)
PCP - Dr. Abelino Derrick Cardiologist - Dr. Jonette Pesa Urologist: Dr. Irine Seal Neuro: Debbora Presto NP  PPM/ICD - Denies  Chest x-ray - N/A EKG - 11/28/20 Stress Test - Denies ECHO - 02/09/20 Cardiac Cath -Denies   Sleep Study - Yes, Patient has OSA CPAP - Yes  Fasting Blood Sugar - 90 Checks Blood Sugar ___3__ times a day  Blood Thinner Instructions: Called Debbie from Dr. Phoebe Sharps office about when patient should stop Coumadin and ASA. She said that she would have to call Dr. Bebe Shaggy office for instructions and that she would call patient personally with those instructions. Patient made aware.  ERAS Protcol - Yes, G2  COVID TEST- Done in PAT 08/02/21   Anesthesia review: Yes, cardiac hx  Patient denies shortness of breath, fever, cough and chest pain at PAT appointment   All instructions explained to the patient, with a verbal understanding of the material. Patient agrees to go over the instructions while at home for a better understanding. Patient also instructed to self quarantine after being tested for COVID-19. The opportunity to ask questions was provided.

## 2021-08-03 ENCOUNTER — Other Ambulatory Visit: Payer: Self-pay | Admitting: Physician Assistant

## 2021-08-03 ENCOUNTER — Encounter (HOSPITAL_COMMUNITY): Payer: Self-pay

## 2021-08-03 MED ORDER — TRANEXAMIC ACID 1000 MG/10ML IV SOLN
2000.0000 mg | INTRAVENOUS | Status: DC
  Filled 2021-08-03: qty 20

## 2021-08-03 NOTE — Progress Notes (Signed)
On coumadin, but I believe he is bridging with lovenox?   Jackelyn Poling, can you have him come in to pre-op 45 min earlier than already scheduled to have repeat labs?

## 2021-08-03 NOTE — Anesthesia Preprocedure Evaluation (Addendum)
Anesthesia Evaluation  Patient identified by MRN, date of birth, ID band Patient awake    Reviewed: Allergy & Precautions, NPO status , Patient's Chart, lab work & pertinent test results  History of Anesthesia Complications Negative for: history of anesthetic complications  Airway Mallampati: II  TM Distance: >3 FB Neck ROM: Full    Dental  (+) Dental Advisory Given   Pulmonary sleep apnea and Continuous Positive Airway Pressure Ventilation , PE   Pulmonary exam normal        Cardiovascular hypertension, Pt. on medications + DVT  Normal cardiovascular exam   '21 TTE - EF 60 to 65%. Mild left ventricular hypertrophy.  Grade I diastolic dysfunction (impaired relaxation).Left atrial size was mildly dilated. Trivial MR    Neuro/Psych negative neurological ROS  negative psych ROS   GI/Hepatic negative GI ROS, Neg liver ROS,   Endo/Other  diabetes, Type 2, Insulin Dependent Obesity   Renal/GU CRFRenal disease     Musculoskeletal  (+) Arthritis ,   Abdominal   Peds  Hematology  On coumadin Coags ordered    Anesthesia Other Findings   Reproductive/Obstetrics                           Anesthesia Physical Anesthesia Plan  ASA: 3  Anesthesia Plan: Spinal   Post-op Pain Management:  Regional for Post-op pain   Induction:   PONV Risk Score and Plan: 1 and Treatment may vary due to age or medical condition and Propofol infusion  Airway Management Planned: Natural Airway and Simple Face Mask  Additional Equipment: None  Intra-op Plan:   Post-operative Plan:   Informed Consent: I have reviewed the patients History and Physical, chart, labs and discussed the procedure including the risks, benefits and alternatives for the proposed anesthesia with the patient or authorized representative who has indicated his/her understanding and acceptance.       Plan Discussed with: CRNA and  Anesthesiologist  Anesthesia Plan Comments: (Labs reviewed, platelets acceptable. Discussed risks and benefits of spinal, including spinal/epidural hematoma, infection, failed block, and PDPH. Patient expressed understanding and wished to proceed. )      Anesthesia Quick Evaluation

## 2021-08-03 NOTE — Progress Notes (Signed)
Anesthesia Chart Review:  Case: [id number] Date/Time: 08/06/21 0948   Procedure: LEFT TOTAL KNEE ARTHROPLASTY (Left: Knee)   Anesthesia type: Spinal   Pre-op diagnosis: left knee degenerative joint disease   Location: [facility] OR ROOM 04 / [place] OR   Surgeons: Leandrew Koyanagi, MD       DISCUSSION: Patient is a 65 year old male scheduled for the above procedure.  History includes never smoker, DM2, HTN, DOE, CKD (stage III), hyperlipidemia, OSA (uses CPAP), PE (2009), DVT (age indeterminate LLE DVT 02/09/20), syncope (02/08/20, likely vasovagal), BPH, nephrolithiasis.  Debbie at Dr. Phoebe Sharps office to clarify perioperative ASA and warfarin instructions and contact patient. He was on Pletal as well for PE/DVT history, but discontinued by Dr. Ethelene Hal 07/10/21. He is scheduled to get PT/PTT repeated on the day of surgery.   08/02/2021 presurgical COVID-19 test negative.  Anesthesia team to evaluate on the day of surgery. Ortho is asking him to come 2 hours 45 minutes early for surgery given need to repeat PT/PTT.   VS: BP (!) 143/79   Pulse 85   Temp 36.7 C (Oral)   Resp 18   Ht 6\' 2"  (1.88 m)   Wt 106.8 kg   SpO2 100%   BMI 30.24 kg/m    PROVIDERS: [name], MD is PCP. Last visit 07/10/21. He is aware that left TKA was planned.  Jonette Pesa, MD is cardiologist. Last visit 11/28/20 for follow-up syncope 02/08/20, likely vasovagal.  Wendie Simmer, MD is urologist Star Age, MD is neurologist (OSA) Kelton Pillar, Mammie Lorenzo, MD is endocrinologist. Last visit 04/23/21.    LABS: Preoperative labs noted. INR 2.2, PTT 39. Creatinine 1.68, consistent with previous results and known CKD (Cr 1.64-1.77 since 10/11/20, up to 2.76 10/2020 and 3.38 06/2020 during admissions for ureteral calculus). A1c 5.9% on 06/19/21. He is for repeat PT/PTT on arival.  (all labs ordered are listed, but only abnormal results are displayed)  Labs Reviewed  SURGICAL PCR SCREEN - Abnormal; Notable for the following  components:      Result Value   Staphylococcus aureus POSITIVE (*)    All other components within normal limits  GLUCOSE, CAPILLARY - Abnormal; Notable for the following components:   Glucose-Capillary 163 (*)    All other components within normal limits  COMPREHENSIVE METABOLIC PANEL - Abnormal; Notable for the following components:   Glucose, Bld 149 (*)    BUN 29 (*)    Creatinine, Ser 1.68 (*)    GFR, Estimated 44 (*)    All other components within normal limits  PROTIME-INR - Abnormal; Notable for the following components:   Prothrombin Time 24.7 (*)    INR 2.2 (*)    All other components within normal limits  APTT - Abnormal; Notable for the following components:   aPTT 39 (*)    All other components within normal limits  SARS CORONAVIRUS 2 (TAT 6-24 HRS)  CBC WITH DIFFERENTIAL/PLATELET  URINALYSIS, ROUTINE W REFLEX MICROSCOPIC    Baseline Polysomnogram 05/16/20: IMPRESSION:  1. Obstructive Sleep Apnea (OSA)  2. Dysfunctions associated with sleep stages or arousal from  sleep  RECOMMENDATIONS:  1. This study demonstrates overall mild obstructive sleep apnea,  severe during REM sleep, with a total AHI of 11.6/hour, REM AHI  of 35.7/hour, and O2 nadir of 77%. Given the patient's medical  history and sleep related complaints, treatment with positive  airway pressure is recommended..   IMAGES: CXR 03/30/21: FINDINGS: - Cardiac silhouette is normal in size and configuration. Normal mediastinal  and hilar contours. - Clear lungs.  No pleural effusion or pneumothorax. - Skeletal structures are intact. IMPRESSION: No active cardiopulmonary disease.    EKG: 11/28/20: NSR   CV: Echo 02/09/20: IMPRESSIONS   1. Left ventricular ejection fraction, by estimation, is 60 to 65%. The  left ventricle has normal function. The left ventricle has no regional  wall motion abnormalities. There is mild left ventricular hypertrophy.  Left ventricular diastolic parameters  are  consistent with Grade I diastolic dysfunction (impaired relaxation).   2. Right ventricular systolic function is normal. The right ventricular  size is normal.   3. Left atrial size was mildly dilated.   4. The mitral valve is grossly normal. Trivial mitral valve  regurgitation.   5. The aortic valve is tricuspid. Aortic valve regurgitation is not  visualized.   6. The inferior vena cava is normal in size with <50% respiratory  variability, suggesting right atrial pressure of 8 mmHg.   US Carotid 02/09/20: Summary:  Right Carotid: Velocities in the right ICA are consistent with a 1-39%  stenosis.  Left Carotid: Velocities in the left ICA are consistent with a 1-39%  stenosis.  Vertebrals: Bilateral vertebral arteries demonstrate antegrade flow.    LE Venous US 02/09/20: Summary:  RIGHT:  - No evidence of common femoral vein obstruction.  LEFT:  - Findings consistent with age indeterminate deep vein thrombosis  involving the left femoral vein, left popliteal vein, and left peroneal  veins.  - A cystic structure is found in the popliteal fossa.    Past Medical History:  Diagnosis Date   Arthritis    Chronic kidney disease    reduced kidney function. Stage III   Diabetes mellitus without complication (HCC)    DVT (deep venous thrombosis) [facility])    age indeterminate LLE DVT 02/09/20   Dyspnea    On exertion   History of kidney stones    History of pulmonary embolus (PE)    Hypertension    Lipidemia    Prostate enlargement    Pulmonary embolism (IXL) 2009   Sleep apnea     Past Surgical History:  Procedure Laterality Date   CYSTOSCOPY W/ URETERAL STENT PLACEMENT Left 10/08/2020   Procedure: CYSTOSCOPY WITH RETROGRADE PYELOGRAM/URETERAL STENT PLACEMENT;  Surgeon: Irine Seal, MD;  Location: [facility];  Service: Urology;  Laterality: Left;   CYSTOSCOPY/URETEROSCOPY/HOLMIUM LASER/STENT PLACEMENT Right 06/07/2020   Procedure: CYSTOSCOPY/URETEROSCOPY/HOLMIUM LASER/STENT PLACEMENT;   Surgeon: Lucas Mallow, MD;  Location: [facility];  Service: Urology;  Laterality: Right;   CYSTOSCOPY/URETEROSCOPY/HOLMIUM LASER/STENT PLACEMENT Left 10/19/2020   Procedure: CYSTOSCOPY LEFT URETEROSCOPY/HOLMIUM LASER/STENT EXCHANGE;  Surgeon: Irine Seal, MD;  Location: [facility];  Service: Urology;  Laterality: Left;    MEDICATIONS:  ACCU-CHEK GUIDE test strip   Accu-Chek Softclix Lancets lancets   acetaminophen (TYLENOL) 650 MG CR tablet   amLODipine (NORVASC) 10 MG tablet   aspirin EC 81 MG tablet   BD INSULIN SYRINGE U/F 31G X 5/16" 1 ML MISC   Blood Glucose Monitoring Suppl (ACCU-CHEK AVIVA) device   cephALEXin (KEFLEX) 500 MG capsule   diclofenac Sodium (VOLTAREN) 1 % GEL   docusate sodium (COLACE) 100 MG capsule   Dulaglutide (TRULICITY) 1.5 KD/9.8PJ SOPN   gabapentin (NEURONTIN) 300 MG capsule   insulin NPH-regular Human (HUMULIN 70/30) (70-30) 100 UNIT/ML injection   Insulin Syringes, Disposable, U-100 1 ML MISC   losartan (COZAAR) 50 MG tablet   lovastatin (MEVACOR) 40 MG tablet   methocarbamol (ROBAXIN) 500 MG tablet  montelukast (SINGULAIR) 10 MG tablet   ondansetron (ZOFRAN) 4 MG tablet   oxyCODONE-acetaminophen (PERCOCET) 5-325 MG tablet   traMADol (ULTRAM) 50 MG tablet   warfarin (COUMADIN) 5 MG tablet   No current facility-administered medications for this encounter.    Myra Gianotti, PA-C Surgical Short Stay/Anesthesiology [facility] Phone [phone] [facility] Phone [phone] 08/03/2021 12:32 PM

## 2021-08-06 ENCOUNTER — Encounter (HOSPITAL_COMMUNITY)
Admission: RE | Disposition: A | Discharge status: Home / Self Care | Payer: Self-pay | Source: Ambulatory Visit | Attending: Orthopaedic Surgery

## 2021-08-06 ENCOUNTER — Encounter (HOSPITAL_COMMUNITY): Payer: Self-pay | Admitting: Orthopaedic Surgery

## 2021-08-06 ENCOUNTER — Ambulatory Visit (HOSPITAL_COMMUNITY): Payer: Medicare Other | Admitting: Vascular Surgery

## 2021-08-06 ENCOUNTER — Observation Stay (HOSPITAL_COMMUNITY): Payer: Medicare Other

## 2021-08-06 ENCOUNTER — Observation Stay (HOSPITAL_COMMUNITY)
Admission: RE | Admit: 2021-08-06 | Discharge: 2021-08-07 | Disposition: A | Discharge status: Home / Self Care | Payer: Medicare Other | Source: Ambulatory Visit | Attending: Orthopaedic Surgery | Admitting: Orthopaedic Surgery

## 2021-08-06 ENCOUNTER — Ambulatory Visit (HOSPITAL_COMMUNITY): Payer: Medicare Other | Admitting: Anesthesiology

## 2021-08-06 ENCOUNTER — Other Ambulatory Visit: Payer: Self-pay

## 2021-08-06 DIAGNOSIS — M1712 Unilateral primary osteoarthritis, left knee: Secondary | ICD-10-CM | POA: Diagnosis present

## 2021-08-06 DIAGNOSIS — Z79899 Other long term (current) drug therapy: Secondary | ICD-10-CM | POA: Diagnosis not present

## 2021-08-06 DIAGNOSIS — I129 Hypertensive chronic kidney disease with stage 1 through stage 4 chronic kidney disease, or unspecified chronic kidney disease: Secondary | ICD-10-CM | POA: Diagnosis not present

## 2021-08-06 DIAGNOSIS — R52 Pain, unspecified: Secondary | ICD-10-CM

## 2021-08-06 DIAGNOSIS — Z96652 Presence of left artificial knee joint: Secondary | ICD-10-CM

## 2021-08-06 DIAGNOSIS — Z7982 Long term (current) use of aspirin: Secondary | ICD-10-CM | POA: Diagnosis not present

## 2021-08-06 DIAGNOSIS — N183 Chronic kidney disease, stage 3 unspecified: Secondary | ICD-10-CM | POA: Insufficient documentation

## 2021-08-06 DIAGNOSIS — E1122 Type 2 diabetes mellitus with diabetic chronic kidney disease: Secondary | ICD-10-CM | POA: Diagnosis not present

## 2021-08-06 DIAGNOSIS — Z794 Long term (current) use of insulin: Secondary | ICD-10-CM | POA: Insufficient documentation

## 2021-08-06 DIAGNOSIS — Z7984 Long term (current) use of oral hypoglycemic drugs: Secondary | ICD-10-CM | POA: Diagnosis not present

## 2021-08-06 DIAGNOSIS — Z7901 Long term (current) use of anticoagulants: Secondary | ICD-10-CM | POA: Insufficient documentation

## 2021-08-06 HISTORY — PX: TOTAL KNEE ARTHROPLASTY: SHX125

## 2021-08-06 LAB — GLUCOSE, CAPILLARY
Glucose-Capillary: 78 mg/dL (ref 70–99)
Glucose-Capillary: 61 mg/dL — ABNORMAL LOW (ref 70–99)
Glucose-Capillary: 101 mg/dL — ABNORMAL HIGH (ref 70–99)
Glucose-Capillary: 109 mg/dL — ABNORMAL HIGH (ref 70–99)
Glucose-Capillary: 116 mg/dL — ABNORMAL HIGH (ref 70–99)
Glucose-Capillary: 60 mg/dL — ABNORMAL LOW (ref 70–99)
Glucose-Capillary: 78 mg/dL (ref 70–99)
Glucose-Capillary: 127 mg/dL — ABNORMAL HIGH (ref 70–99)

## 2021-08-06 LAB — APTT: aPTT: 29 seconds (ref 24–36)

## 2021-08-06 LAB — PROTIME-INR
INR: 1.2 (ref 0.8–1.2)
Prothrombin Time: 15.5 seconds — ABNORMAL HIGH (ref 11.4–15.2)

## 2021-08-06 SURGERY — ARTHROPLASTY, KNEE, TOTAL
Anesthesia: Spinal | Site: Knee | Laterality: Left

## 2021-08-06 MED ORDER — TRANEXAMIC ACID-NACL 1000-0.7 MG/100ML-% IV SOLN
1000.0000 mg | Freq: Once | INTRAVENOUS | Status: DC
  Filled 2021-08-06: qty 100

## 2021-08-06 MED ORDER — OXYCODONE HCL 5 MG PO TABS: 10.0000 mg | ORAL_TABLET | ORAL | Status: DC | PRN

## 2021-08-06 MED ORDER — ACETAMINOPHEN 325 MG PO TABS: 325.0000 mg | ORAL_TABLET | Freq: Four times a day (QID) | ORAL | Status: DC | PRN

## 2021-08-06 MED ORDER — SODIUM CHLORIDE 0.9 % IV SOLN
INTRAVENOUS | Status: DC | PRN
  Administered 2021-08-06: 50 ug/min via INTRAVENOUS

## 2021-08-06 MED ORDER — FENTANYL CITRATE (PF) 100 MCG/2ML IJ SOLN
INTRAMUSCULAR | Status: AC
  Administered 2021-08-06: 50 ug via INTRAVENOUS
  Filled 2021-08-06: qty 2

## 2021-08-06 MED ORDER — LACTATED RINGERS IV SOLN: INTRAVENOUS | Status: DC | PRN

## 2021-08-06 MED ORDER — DEXTROSE 50 % IV SOLN: 12.5000 g | Freq: Once | INTRAVENOUS | Status: AC

## 2021-08-06 MED ORDER — BUPIVACAINE-EPINEPHRINE (PF) 0.5% -1:200000 IJ SOLN
INTRAMUSCULAR | Status: DC | PRN
  Administered 2021-08-06: 30 mL via PERINEURAL

## 2021-08-06 MED ORDER — AMLODIPINE BESYLATE 5 MG PO TABS
10.0000 mg | ORAL_TABLET | Freq: Every day | ORAL | Status: DC
  Administered 2021-08-07: 10 mg via ORAL
  Filled 2021-08-06: qty 2

## 2021-08-06 MED ORDER — BUPIVACAINE IN DEXTROSE 0.75-8.25 % IT SOLN
INTRATHECAL | Status: DC | PRN
  Administered 2021-08-06: 1.8 mL via INTRATHECAL

## 2021-08-06 MED ORDER — LACTATED RINGERS IV SOLN: INTRAVENOUS | Status: DC

## 2021-08-06 MED ORDER — WARFARIN - PHARMACIST DOSING INPATIENT: Freq: Every day | Status: DC

## 2021-08-06 MED ORDER — CHLORHEXIDINE GLUCONATE 0.12 % MT SOLN
15.0000 mL | Freq: Once | OROMUCOSAL | Status: AC
  Administered 2021-08-06: 15 mL via OROMUCOSAL
  Filled 2021-08-06: qty 15

## 2021-08-06 MED ORDER — METOCLOPRAMIDE HCL 5 MG PO TABS: 5.0000 mg | ORAL_TABLET | Freq: Three times a day (TID) | ORAL | Status: DC | PRN

## 2021-08-06 MED ORDER — 0.9 % SODIUM CHLORIDE (POUR BTL) OPTIME
TOPICAL | Status: DC | PRN
  Administered 2021-08-06: 1000 mL

## 2021-08-06 MED ORDER — DOCUSATE SODIUM 100 MG PO CAPS
100.0000 mg | ORAL_CAPSULE | Freq: Two times a day (BID) | ORAL | Status: DC
  Administered 2021-08-06 – 2021-08-07 (×2): 100 mg via ORAL
  Filled 2021-08-06 (×2): qty 1

## 2021-08-06 MED ORDER — VANCOMYCIN HCL 1000 MG IV SOLR
INTRAVENOUS | Status: AC
  Filled 2021-08-06: qty 20

## 2021-08-06 MED ORDER — ORAL CARE MOUTH RINSE: 15.0000 mL | Freq: Once | OROMUCOSAL | Status: AC

## 2021-08-06 MED ORDER — DEXTROSE 50 % IV SOLN
INTRAVENOUS | Status: AC
  Administered 2021-08-06: 12.5 g via INTRAVENOUS
  Filled 2021-08-06: qty 50

## 2021-08-06 MED ORDER — BUPIVACAINE-MELOXICAM ER 400-12 MG/14ML IJ SOLN
INTRAMUSCULAR | Status: AC
  Filled 2021-08-06: qty 1

## 2021-08-06 MED ORDER — TRANEXAMIC ACID-NACL 1000-0.7 MG/100ML-% IV SOLN
1000.0000 mg | INTRAVENOUS | Status: AC
  Administered 2021-08-06: 1000 mg via INTRAVENOUS

## 2021-08-06 MED ORDER — POVIDONE-IODINE 10 % EX SWAB
2.0000 "application " | Freq: Once | CUTANEOUS | Status: AC
  Administered 2021-08-06: 2 via TOPICAL

## 2021-08-06 MED ORDER — PHENOL 1.4 % MT LIQD: 1.0000 | OROMUCOSAL | Status: DC | PRN

## 2021-08-06 MED ORDER — SODIUM CHLORIDE 0.9 % IR SOLN
Status: DC | PRN
  Administered 2021-08-06: 1000 mL

## 2021-08-06 MED ORDER — METOCLOPRAMIDE HCL 5 MG PO TABS
5.0000 mg | ORAL_TABLET | Freq: Three times a day (TID) | ORAL | Status: DC | PRN
  Filled 2021-08-06: qty 2

## 2021-08-06 MED ORDER — TRANEXAMIC ACID-NACL 1000-0.7 MG/100ML-% IV SOLN
INTRAVENOUS | Status: AC
  Filled 2021-08-06: qty 100

## 2021-08-06 MED ORDER — METOCLOPRAMIDE HCL 5 MG/ML IJ SOLN: 5.0000 mg | Freq: Three times a day (TID) | INTRAMUSCULAR | Status: DC | PRN

## 2021-08-06 MED ORDER — MENTHOL 3 MG MT LOZG: 1.0000 | LOZENGE | OROMUCOSAL | Status: DC | PRN

## 2021-08-06 MED ORDER — CEFAZOLIN SODIUM-DEXTROSE 2-4 GM/100ML-% IV SOLN
2.0000 g | Freq: Four times a day (QID) | INTRAVENOUS | Status: AC
Start: 2021-08-06 — End: 2021-08-06
  Administered 2021-08-06 (×2): 2 g via INTRAVENOUS
  Filled 2021-08-06 (×2): qty 100

## 2021-08-06 MED ORDER — INSULIN ASPART 100 UNIT/ML IJ SOLN
0.0000 [IU] | Freq: Three times a day (TID) | INTRAMUSCULAR | Status: DC
  Administered 2021-08-07: 5 [IU] via SUBCUTANEOUS
  Administered 2021-08-07: 3 [IU] via SUBCUTANEOUS

## 2021-08-06 MED ORDER — ONDANSETRON HCL 4 MG/2ML IJ SOLN: 4.0000 mg | Freq: Four times a day (QID) | INTRAMUSCULAR | Status: DC | PRN

## 2021-08-06 MED ORDER — FENTANYL CITRATE (PF) 100 MCG/2ML IJ SOLN: 50.0000 ug | Freq: Once | INTRAMUSCULAR | Status: AC

## 2021-08-06 MED ORDER — PHENYLEPHRINE HCL (PRESSORS) 10 MG/ML IV SOLN
INTRAVENOUS | Status: DC | PRN
  Administered 2021-08-06: 80 ug via INTRAVENOUS
  Administered 2021-08-06: 120 ug via INTRAVENOUS

## 2021-08-06 MED ORDER — DEXAMETHASONE SODIUM PHOSPHATE 10 MG/ML IJ SOLN
10.0000 mg | Freq: Once | INTRAMUSCULAR | Status: AC
  Administered 2021-08-07: 10 mg via INTRAVENOUS
  Filled 2021-08-06: qty 1

## 2021-08-06 MED ORDER — ONDANSETRON HCL 4 MG PO TABS: 4.0000 mg | ORAL_TABLET | Freq: Four times a day (QID) | ORAL | Status: DC | PRN

## 2021-08-06 MED ORDER — HYDROMORPHONE HCL 1 MG/ML IJ SOLN
0.5000 mg | INTRAMUSCULAR | Status: DC | PRN
  Administered 2021-08-06: 1 mg via INTRAVENOUS
  Filled 2021-08-06: qty 1

## 2021-08-06 MED ORDER — PROPOFOL 10 MG/ML IV BOLUS
INTRAVENOUS | Status: DC | PRN
  Administered 2021-08-06: 50 mg via INTRAVENOUS

## 2021-08-06 MED ORDER — METHOCARBAMOL 1000 MG/10ML IJ SOLN
500.0000 mg | Freq: Four times a day (QID) | INTRAMUSCULAR | Status: DC | PRN
  Filled 2021-08-06: qty 5

## 2021-08-06 MED ORDER — METHOCARBAMOL 500 MG PO TABS: 500.0000 mg | ORAL_TABLET | Freq: Four times a day (QID) | ORAL | Status: DC | PRN

## 2021-08-06 MED ORDER — OXYCODONE HCL 5 MG PO TABS: 5.0000 mg | ORAL_TABLET | ORAL | Status: DC | PRN

## 2021-08-06 MED ORDER — ACETAMINOPHEN 500 MG PO TABS
1000.0000 mg | ORAL_TABLET | Freq: Four times a day (QID) | ORAL | Status: DC
  Administered 2021-08-06 – 2021-08-07 (×3): 1000 mg via ORAL
  Filled 2021-08-06 (×3): qty 2

## 2021-08-06 MED ORDER — METHOCARBAMOL 500 MG PO TABS
500.0000 mg | ORAL_TABLET | Freq: Four times a day (QID) | ORAL | Status: DC | PRN
  Administered 2021-08-06 – 2021-08-07 (×3): 500 mg via ORAL
  Filled 2021-08-06 (×3): qty 1

## 2021-08-06 MED ORDER — METHOCARBAMOL 1000 MG/10ML IJ SOLN
500.0000 mg | Freq: Four times a day (QID) | INTRAVENOUS | Status: DC | PRN
  Filled 2021-08-06: qty 5

## 2021-08-06 MED ORDER — SODIUM CHLORIDE 0.9 % IV SOLN: INTRAVENOUS | Status: DC

## 2021-08-06 MED ORDER — TRANEXAMIC ACID-NACL 1000-0.7 MG/100ML-% IV SOLN
1000.0000 mg | Freq: Once | INTRAVENOUS | Status: AC
  Administered 2021-08-06: 1000 mg via INTRAVENOUS
  Filled 2021-08-06: qty 100

## 2021-08-06 MED ORDER — IRRISEPT - 450ML BOTTLE WITH 0.05% CHG IN STERILE WATER, USP 99.95% OPTIME
TOPICAL | Status: DC | PRN
  Administered 2021-08-06: 450 mL via TOPICAL

## 2021-08-06 MED ORDER — GABAPENTIN 300 MG PO CAPS
300.0000 mg | ORAL_CAPSULE | Freq: Four times a day (QID) | ORAL | Status: DC
  Administered 2021-08-06 – 2021-08-07 (×4): 300 mg via ORAL
  Filled 2021-08-06 (×4): qty 1

## 2021-08-06 MED ORDER — PROPOFOL 500 MG/50ML IV EMUL
INTRAVENOUS | Status: DC | PRN
  Administered 2021-08-06: 100 ug/kg/min via INTRAVENOUS

## 2021-08-06 MED ORDER — MIDAZOLAM HCL 2 MG/2ML IJ SOLN
INTRAMUSCULAR | Status: AC
  Filled 2021-08-06: qty 2

## 2021-08-06 MED ORDER — OXYCODONE HCL 5 MG PO TABS
5.0000 mg | ORAL_TABLET | ORAL | Status: DC | PRN
  Administered 2021-08-06 – 2021-08-07 (×4): 10 mg via ORAL
  Filled 2021-08-06 (×4): qty 2

## 2021-08-06 MED ORDER — CEFAZOLIN SODIUM-DEXTROSE 2-4 GM/100ML-% IV SOLN
2.0000 g | INTRAVENOUS | Status: AC
  Administered 2021-08-06: 2 g via INTRAVENOUS

## 2021-08-06 MED ORDER — WARFARIN SODIUM 5 MG PO TABS
5.0000 mg | ORAL_TABLET | Freq: Once | ORAL | Status: AC
  Administered 2021-08-06: 5 mg via ORAL
  Filled 2021-08-06: qty 1

## 2021-08-06 MED ORDER — ACETAMINOPHEN 500 MG PO TABS: 1000.0000 mg | ORAL_TABLET | Freq: Four times a day (QID) | ORAL | Status: DC

## 2021-08-06 MED ORDER — HYDROMORPHONE HCL 1 MG/ML IJ SOLN: 0.5000 mg | INTRAMUSCULAR | Status: DC | PRN

## 2021-08-06 MED ORDER — ONDANSETRON HCL 4 MG PO TABS
4.0000 mg | ORAL_TABLET | Freq: Four times a day (QID) | ORAL | Status: DC | PRN
  Filled 2021-08-06: qty 1

## 2021-08-06 MED ORDER — ONDANSETRON HCL 4 MG/2ML IJ SOLN
INTRAMUSCULAR | Status: DC | PRN
  Administered 2021-08-06: 4 mg via INTRAVENOUS

## 2021-08-06 MED ORDER — CEFAZOLIN SODIUM-DEXTROSE 2-4 GM/100ML-% IV SOLN
INTRAVENOUS | Status: AC
  Filled 2021-08-06: qty 100

## 2021-08-06 MED ORDER — INSULIN ASPART 100 UNIT/ML IJ SOLN: 0.0000 [IU] | Freq: Every day | INTRAMUSCULAR | Status: DC

## 2021-08-06 SURGICAL SUPPLY — 79 items
ALCOHOL 70% 16 OZ (MISCELLANEOUS) ×2 IMPLANT
BAG COUNTER SPONGE SURGICOUNT (BAG) IMPLANT
BAG DECANTER FOR FLEXI CONT (MISCELLANEOUS) ×2 IMPLANT
BANDAGE ESMARK 6X9 LF (GAUZE/BANDAGES/DRESSINGS) IMPLANT
BLADE SAG 18X100X1.27 (BLADE) ×2 IMPLANT
BNDG ESMARK 6X9 LF (GAUZE/BANDAGES/DRESSINGS)
BOWL SMART MIX CTS (DISPOSABLE) ×2 IMPLANT
CEMENT BONE REFOBACIN R1X40 US (Cement) ×4 IMPLANT
CLSR STERI-STRIP ANTIMIC 1/2X4 (GAUZE/BANDAGES/DRESSINGS) ×4 IMPLANT
COOLER ICEMAN CLASSIC (MISCELLANEOUS) ×2 IMPLANT
COVER SURGICAL LIGHT HANDLE (MISCELLANEOUS) ×2 IMPLANT
CUFF TOURN SGL QUICK 34 (TOURNIQUET CUFF) ×1
CUFF TOURN SGL QUICK 42 (TOURNIQUET CUFF) IMPLANT
CUFF TRNQT CYL 34X4.125X (TOURNIQUET CUFF) ×1 IMPLANT
DERMABOND ADHESIVE PROPEN (GAUZE/BANDAGES/DRESSINGS) ×1
DERMABOND ADVANCED (GAUZE/BANDAGES/DRESSINGS) ×1
DERMABOND ADVANCED .7 DNX12 (GAUZE/BANDAGES/DRESSINGS) ×1 IMPLANT
DERMABOND ADVANCED .7 DNX6 (GAUZE/BANDAGES/DRESSINGS) ×1 IMPLANT
DRAPE EXTREMITY T 121X128X90 (DISPOSABLE) ×2 IMPLANT
DRAPE HALF SHEET 40X57 (DRAPES) ×2 IMPLANT
DRAPE INCISE IOBAN 66X45 STRL (DRAPES) IMPLANT
DRAPE ORTHO SPLIT 77X108 STRL (DRAPES) ×2
DRAPE POUCH INSTRU U-SHP 10X18 (DRAPES) ×2 IMPLANT
DRAPE SURG ORHT 6 SPLT 77X108 (DRAPES) ×2 IMPLANT
DRAPE U-SHAPE 47X51 STRL (DRAPES) ×4 IMPLANT
DRSG AQUACEL AG ADV 3.5X10 (GAUZE/BANDAGES/DRESSINGS) ×2 IMPLANT
DRSG AQUACEL AG ADV 3.5X14 (GAUZE/BANDAGES/DRESSINGS) ×2 IMPLANT
DURAPREP 26ML APPLICATOR (WOUND CARE) ×6 IMPLANT
ELECT CAUTERY BLADE 6.4 (BLADE) ×2 IMPLANT
ELECT REM PT RETURN 9FT ADLT (ELECTROSURGICAL) ×2
ELECTRODE REM PT RTRN 9FT ADLT (ELECTROSURGICAL) ×1 IMPLANT
FEMORAL KNEE COMP SZ 8 STND LT (Knees) ×2 IMPLANT
FEMORAL KNEE COMP SZ 8STD LT (Knees) ×1 IMPLANT
GLOVE SURG NEOP MICRO LF SZ7.5 (GLOVE) ×6 IMPLANT
GLOVE SURG SYN 7.5  E (GLOVE) ×4
GLOVE SURG SYN 7.5 E (GLOVE) ×4 IMPLANT
GLOVE SURG UNDER LTX SZ7.5 (GLOVE) ×6 IMPLANT
GLOVE SURG UNDER POLY LF SZ7 (GLOVE) ×10 IMPLANT
GOWN STRL REIN XL XLG (GOWN DISPOSABLE) ×2 IMPLANT
GOWN STRL REUS W/ TWL LRG LVL3 (GOWN DISPOSABLE) ×1 IMPLANT
GOWN STRL REUS W/TWL LRG LVL3 (GOWN DISPOSABLE) ×1
HANDPIECE INTERPULSE COAX TIP (DISPOSABLE) ×1
HDLS TROCR DRIL PIN KNEE 75 (PIN) ×4
HOOD PEEL AWAY FLYTE STAYCOOL (MISCELLANEOUS) ×4 IMPLANT
INSERT TIBIA ARTIC SZ 8-11 13 (Joint) ×2 IMPLANT
JET LAVAGE IRRISEPT WOUND (IRRIGATION / IRRIGATOR) ×2
KIT BASIN OR (CUSTOM PROCEDURE TRAY) ×2 IMPLANT
KIT TURNOVER KIT B (KITS) ×2 IMPLANT
LAVAGE JET IRRISEPT WOUND (IRRIGATION / IRRIGATOR) ×1 IMPLANT
MANIFOLD NEPTUNE II (INSTRUMENTS) ×2 IMPLANT
MARKER SKIN DUAL TIP RULER LAB (MISCELLANEOUS) ×2 IMPLANT
NEEDLE SPNL 18GX3.5 QUINCKE PK (NEEDLE) ×2 IMPLANT
NS IRRIG 1000ML POUR BTL (IV SOLUTION) ×2 IMPLANT
PACK TOTAL JOINT (CUSTOM PROCEDURE TRAY) ×2 IMPLANT
PAD ARMBOARD 7.5X6 YLW CONV (MISCELLANEOUS) ×4 IMPLANT
PAD COLD SHLDR WRAP-ON (PAD) ×2 IMPLANT
PIN DRILL HDLS TROCAR 75 4PK (PIN) ×4 IMPLANT
SAW OSC TIP CART 19.5X105X1.3 (SAW) ×2 IMPLANT
SCREW FEMALE HEX FIX 25X2.5 (ORTHOPEDIC DISPOSABLE SUPPLIES) ×2 IMPLANT
SET HNDPC FAN SPRY TIP SCT (DISPOSABLE) ×1 IMPLANT
STAPLER VISISTAT 35W (STAPLE) IMPLANT
STEM POLY PAT PLY 35M KNEE (Knees) ×2 IMPLANT
STEM TIBIA 5 DEG SZ G L KNEE (Knees) ×1 IMPLANT
SUCTION FRAZIER HANDLE 10FR (MISCELLANEOUS) ×1
SUCTION TUBE FRAZIER 10FR DISP (MISCELLANEOUS) ×1 IMPLANT
SUT ETHILON 2 0 FS 18 (SUTURE) IMPLANT
SUT MNCRL AB 4-0 PS2 18 (SUTURE) IMPLANT
SUT VIC AB 0 CT1 27 (SUTURE) ×2
SUT VIC AB 0 CT1 27XBRD ANBCTR (SUTURE) ×2 IMPLANT
SUT VIC AB 1 CTX 27 (SUTURE) ×6 IMPLANT
SUT VIC AB 2-0 CT1 27 (SUTURE) ×4
SUT VIC AB 2-0 CT1 TAPERPNT 27 (SUTURE) ×4 IMPLANT
SYR 50ML LL SCALE MARK (SYRINGE) ×4 IMPLANT
TIBIA STEM 5 DEG SZ G L KNEE (Knees) ×2 IMPLANT
TOWEL GREEN STERILE (TOWEL DISPOSABLE) ×2 IMPLANT
TOWEL GREEN STERILE FF (TOWEL DISPOSABLE) ×2 IMPLANT
TRAY CATH 16FR W/PLASTIC CATH (SET/KITS/TRAYS/PACK) IMPLANT
UNDERPAD 30X36 HEAVY ABSORB (UNDERPADS AND DIAPERS) ×2 IMPLANT
YANKAUER SUCT BULB TIP NO VENT (SUCTIONS) ×2 IMPLANT

## 2021-08-06 NOTE — H&P (Signed)
PREOPERATIVE H&P  Chief Complaint: left knee degenerative joint disease  HPI: [name] is a 64 y.o. male who presents for surgical treatment of left knee degenerative joint disease.  He denies any changes in medical history.  Past Medical History:  Diagnosis Date   Arthritis    Chronic kidney disease    reduced kidney function. Stage III   Diabetes mellitus without complication (HCC)    DVT (deep venous thrombosis) [facility])    age indeterminate LLE DVT 02/09/20   Dyspnea    On exertion   History of kidney stones    History of pulmonary embolus (PE)    Hypertension    Lipidemia    Prostate enlargement    Pulmonary embolism ([place]) 2009   Sleep apnea    Past Surgical History:  Procedure Laterality Date   CYSTOSCOPY W/ URETERAL STENT PLACEMENT Left 10/08/2020   Procedure: CYSTOSCOPY WITH RETROGRADE PYELOGRAM/URETERAL STENT PLACEMENT;  Surgeon: Irine Seal, MD;  Location: [facility];  Service: Urology;  Laterality: Left;   CYSTOSCOPY/URETEROSCOPY/HOLMIUM LASER/STENT PLACEMENT Right 06/07/2020   Procedure: CYSTOSCOPY/URETEROSCOPY/HOLMIUM LASER/STENT PLACEMENT;  Surgeon: Lucas Mallow, MD;  Location: [facility];  Service: Urology;  Laterality: Right;   CYSTOSCOPY/URETEROSCOPY/HOLMIUM LASER/STENT PLACEMENT Left 10/19/2020   Procedure: CYSTOSCOPY LEFT URETEROSCOPY/HOLMIUM LASER/STENT EXCHANGE;  Surgeon: Irine Seal, MD;  Location: [facility];  Service: Urology;  Laterality: Left;   Social History   Socioeconomic History   Marital status: Significant Other    Spouse name: Not on file   Number of children: Not on file   Years of education: Not on file   Highest education level: Not on file  Occupational History   Occupation: retired  Tobacco Use   Smoking status: Never   Smokeless tobacco: Never  Vaping Use   Vaping Use: Never used  Substance and Sexual Activity   Alcohol use: Yes    Alcohol/week: 1.0 standard drink    Types: 1 Cans of beer per week    Comment: social   Drug use:  Never   Sexual activity: Yes  Other Topics Concern   Not on file  Social History Narrative   Lives with Friend   Right handed   Drinks 1-2 cups caffeine daily   Social Determinants of Health   Financial Resource Strain: Not on file  Food Insecurity: Not on file  Transportation Needs: Not on file  Physical Activity: Not on file  Stress: Not on file  Social Connections: Not on file   Family History  Problem Relation Age of Onset   Cancer Mother    Heart disease Father    No Known Allergies Prior to Admission medications   Medication Sig Start Date End Date Taking? Authorizing Provider  acetaminophen (TYLENOL) 650 MG CR tablet Take 1,300 mg by mouth in the morning and at bedtime.   Yes [provider]  amLODipine (NORVASC) 10 MG tablet Take 1 tablet (10 mg total) by mouth daily. 02/18/20 07/26/22 Yes Elouise Munroe, MD  aspirin EC 81 MG tablet Take 81 mg by mouth daily.   Yes [provider]  diclofenac Sodium (VOLTAREN) 1 % GEL Apply 1 application topically 4 (four) times daily as needed (pain).   Yes [provider]  Dulaglutide (TRULICITY) 1.5 VZ/5.6LO SOPN Inject 1.5 mg into the skin once a week. Patient taking differently: Inject 1.5 mg into the skin every Friday. 06/04/21  Yes Shamleffer, Melanie Crazier, MD  insulin NPH-regular Human (HUMULIN 70/30) (70-30) 100 UNIT/ML injection Inject 50  Units into the skin daily before breakfast AND 52 Units daily before supper. 02/20/21  Yes Shamleffer, Melanie Crazier, MD  losartan (COZAAR) 50 MG tablet Take 50 mg by mouth daily.   Yes [provider]  lovastatin (MEVACOR) 40 MG tablet TAKE 1 TABLET (40 MG TOTAL) BY MOUTH AT BEDTIME. **NEEDS APPT BEFORE NEXT REFILL** 07/09/21  Yes [name], MD  montelukast (SINGULAIR) 10 MG tablet Take 1 tablet (10 mg total) by mouth at bedtime. 11/20/20  Yes Dutch Quint B, FNP  traMADol (ULTRAM) 50 MG tablet Take 1 tablet (50 mg total) by mouth every 12  (twelve) hours as needed for moderate pain. 07/10/21  Yes [name], MD  warfarin (COUMADIN) 5 MG tablet Take 1 tablet (5 mg total) by mouth daily. Take as directed. Patient taking differently: Take 5-7.5 mg by mouth See admin instructions. Take 1.5 tablets (7.5 mg) by mouth on Wednesdays, Thursdays and Saturdays. Take 1 tablet (5 mg) by mouth on Sundays, Mondays, Tuesdays, and Fridays. 12/11/20  Yes Dutch Quint B, FNP  ACCU-CHEK GUIDE test strip USE TO CHECK BLOOD SUGAR 3 TIMES DAILY.E11.65 05/22/21   Shamleffer, Melanie Crazier, MD  Accu-Chek Softclix Lancets lancets Use as instructed to check blood sugar 3 times daily Dx is E11.65 09/18/20   Shamleffer, Melanie Crazier, MD  BD INSULIN SYRINGE U/F 31G X 5/16" 1 ML MISC USE AS DIRECTED 03/29/21   Shamleffer, Melanie Crazier, MD  Blood Glucose Monitoring Suppl (ACCU-CHEK AVIVA) device by Other route. Use as instructed    [provider]  cephALEXin (KEFLEX) 500 MG capsule Take 1 capsule (500 mg total) by mouth 4 (four) times daily. To be taken after surgery 07/31/21   Aundra Dubin, PA-C  docusate sodium (COLACE) 100 MG capsule Take 1 capsule (100 mg total) by mouth daily as needed. 07/31/21 07/31/22  Aundra Dubin, PA-C  gabapentin (NEURONTIN) 300 MG capsule TAKE 1 CAPSULE BY MOUTH FOUR TIMES A DAY 07/30/21   [name], MD  Insulin Syringes, Disposable, U-100 1 ML MISC 1 Device by Does not apply route as directed. 03/28/21   [name], MD  methocarbamol (ROBAXIN) 500 MG tablet Take 1 tablet (500 mg total) by mouth 2 (two) times daily as needed. To be taken after surgery 07/31/21   Aundra Dubin, PA-C  ondansetron (ZOFRAN) 4 MG tablet Take 1 tablet (4 mg total) by mouth every 8 (eight) hours as needed for nausea or vomiting. 07/31/21   Aundra Dubin, PA-C  oxyCODONE-acetaminophen (PERCOCET) 5-325 MG tablet Take 1-2 tablets by mouth every 6 (six) hours as needed. To be taken after surgery 07/31/21    Aundra Dubin, PA-C     Positive ROS: All other systems have been reviewed and were otherwise negative with the exception of those mentioned in the HPI and as above.  Physical Exam: General: Alert, no acute distress Cardiovascular: No pedal edema Respiratory: No cyanosis, no use of accessory musculature GI: abdomen soft Skin: No lesions in the area of chief complaint Neurologic: Sensation intact distally Psychiatric: Patient is competent for consent with normal mood and affect Lymphatic: no lymphedema  MUSCULOSKELETAL: exam stable  Assessment: left knee degenerative joint disease  Plan: Plan for Procedure(s): LEFT TOTAL KNEE ARTHROPLASTY  The risks benefits and alternatives were discussed with the patient including but not limited to the risks of nonoperative treatment, versus surgical intervention including infection, bleeding, nerve injury,  blood clots, cardiopulmonary complications, morbidity, mortality, among others, and they were  willing to proceed.   Preoperative templating of the joint replacement has been completed, documented, and submitted to the Operating Room personnel in order to optimize intra-operative equipment management.   Eduard Roux, MD 08/06/2021 7:14 AM

## 2021-08-06 NOTE — Anesthesia Postprocedure Evaluation (Signed)
Anesthesia Post Note  Patient: [name]  Procedure(s) Performed: LEFT TOTAL KNEE ARTHROPLASTY (Left: Knee)     Patient location during evaluation: PACU Anesthesia Type: Spinal Level of consciousness: awake and alert Pain management: pain level controlled Vital Signs Assessment: post-procedure vital signs reviewed and stable Respiratory status: spontaneous breathing and respiratory function stable Cardiovascular status: blood pressure returned to baseline and stable Postop Assessment: spinal receding and no apparent nausea or vomiting Anesthetic complications: no   No notable events documented.  Last Vitals:  Vitals:   08/06/21 1242 08/06/21 1257  BP: 112/76 125/75  Pulse: 85 82  Resp: 15 14  Temp:    SpO2: 97% 100%    Last Pain:  Vitals:   08/06/21 1257  TempSrc:   PainSc: 0-No pain                 Audry Pili

## 2021-08-06 NOTE — Op Note (Signed)
Total Knee Arthroplasty Procedure Note  Preoperative diagnosis: Left knee osteoarthritis  Postoperative diagnosis:same  Operative procedure: Left total knee arthroplasty. CPT [phone]  Surgeon: N. Eduard Roux, MD  Assist: Madalyn Rob, PA-C; necessary for the timely completion of procedure and due to complexity of procedure.  Anesthesia: Spinal, regional, local  Tourniquet time: see anesthesia record  Implants used: Zimmer persona Femur: CR 8 Tibia: G Patella: 35 mm Polyethylene: 13 mm, MC  Indication: [name] is a 65 y.o. year old male with a history of knee pain. Having failed conservative management, the patient elected to proceed with a total knee arthroplasty.  We have reviewed the risk and benefits of the surgery and they elected to proceed after voicing understanding.  Procedure:  After informed consent was obtained and understanding of the risk were voiced including but not limited to bleeding, infection, damage to surrounding structures including nerves and vessels, blood clots, leg length inequality and the failure to achieve desired results, the operative extremity was marked with verbal confirmation of the patient in the holding area.   The patient was then brought to the operating room and transported to the operating room table in the supine position.  A tourniquet was applied to the operative extremity around the upper thigh. The operative limb was then prepped and draped in the usual sterile fashion and preoperative antibiotics were administered.  A time out was performed prior to the start of surgery confirming the correct extremity, preoperative antibiotic administration, as well as team members, implants and instruments available for the case. Correct surgical site was also confirmed with preoperative radiographs. The limb was then elevated for exsanguination and the tourniquet was inflated. A midline incision was made and a standard medial parapatellar  approach was performed.  The infrapatellar fat pad was removed.  Suprapatellar synovium was removed to reveal the anterior distal femoral cortex.  There was widespread degenerative wear throughout the knee.  A medial peel was performed to release the capsule of the medial tibial plateau.  The patella was then everted and was prepared and sized to a 35 mm.  A cover was placed on the patella for protection from retractors.  The knee was then brought into full flexion and we then turned our attention to the femur.  The cruciates were sacrificed.  Start site was drilled in the femur and the intramedullary distal femoral cutting guide was placed, set at 5 degrees valgus, taking 10 mm of distal resection. The distal cut was made. Osteophytes were then removed.  Next, the proximal tibial cutting guide was placed with appropriate slope, varus/valgus alignment and depth of resection. The proximal tibial cut was made. Gap blocks were then used to assess the extension gap and alignment, and appropriate soft tissue releases were performed. Attention was turned back to the femur, which was sized using the sizing guide to a size 8. Appropriate rotation of the femoral component was determined using epicondylar axis, Whiteside's line, and assessing the flexion gap under ligament tension. The appropriate size 4-in-1 cutting block was placed and checked with an angel wing and cuts were made. Posterior femoral osteophytes and uncapped bone were then removed with the curved osteotome.  Trial components were placed, and stability was checked in full extension, mid-flexion, and deep flexion. Proper tibial rotation was determined and marked.  The patella tracked well without a lateral release.  The femoral lugs were then drilled. Trial components were then removed and tibial preparation performed.  The tibia was sized for  a size G component.   The bony surfaces were irrigated with a pulse lavage and then dried. Bone cement was vacuum  mixed on the back table, and the final components sized above were cemented into place.  Antibiotic irrigation was placed in the knee joint and soft tissues while the cement cured.  After cement had finished curing, excess cement was removed. The stability of the construct was re-evaluated throughout a range of motion and found to be acceptable. The trial liner was removed, the knee was copiously irrigated, and the knee was re-evaluated for any excess bone debris. The real polyethylene liner, 13 mm thick, was inserted and checked to ensure the locking mechanism had engaged appropriately. The tourniquet was deflated and hemostasis was achieved. The wound was irrigated with normal saline.  One gram of vancomycin powder was placed in the surgical bed.  Capsular closure was performed with a #1 vicryl, subcutaneous fat closed with a 0 vicryl suture, then subcutaneous tissue closed with interrupted 2.0 vicryl suture. The skin was then closed with a 2.0 nylon and dermabond. A sterile dressing was applied.  The patient was awakened in the operating room and taken to recovery in stable condition. All sponge, needle, and instrument counts were correct at the end of the case.  Tawanna Cooler was necessary for opening, closing, retracting, limb positioning and overall facilitation and completion of the surgery.  Position: supine  Complications: none.  Time Out: performed   Drains/Packing: none  Estimated blood loss: minimal  Returned to Recovery Room: in good condition.   Antibiotics: yes   Mechanical VTE (DVT) Prophylaxis: sequential compression devices, TED thigh-high  Chemical VTE (DVT) Prophylaxis: resume coumadin  Fluid Replacement  Crystalloid: see anesthesia record Blood: none  FFP: none   Specimens Removed: 1 to pathology   Sponge and Instrument Count Correct? yes   PACU: portable radiograph - knee AP and Lateral   Plan/RTC: Return in 2 weeks for wound check.   Weight Bearing/Load Lower  Extremity: full   Implant Name Type Inv. Item Serial No. Manufacturer Lot No. LRB No. Used Action  CEMENT BONE REFOBACIN [id number] [place] - [id number] Cement CEMENT BONE REFOBACIN [id number] [place]  ZIMMER RECON(ORTH,TRAU,BIO,SG) [id number] Left 2 Implanted  TIBIA STEM 5 DEG SZ G L KNEE - [id number] Knees TIBIA STEM 5 DEG SZ G L KNEE  ZIMMER RECON(ORTH,TRAU,BIO,SG) [id number] Left 1 Implanted  FEMORAL KNEE COMP SZ 8 STND LT - [id number] Knees FEMORAL KNEE COMP SZ 8 STND LT  ZIMMER RECON(ORTH,TRAU,BIO,SG) [id number] Left 1 Implanted  STEM POLY PAT PLY 42M KNEE - [id number] Knees STEM POLY PAT PLY 42M KNEE  ZIMMER RECON(ORTH,TRAU,BIO,SG) [id number] Left 1 Implanted  INSERT TIBIA ARTIC SZ 8-11 13 - [id number] Joint INSERT TIBIA ARTIC SZ 8-11 13  ZIMMER RECON(ORTH,TRAU,BIO,SG) [id number] Left 1 Implanted    N. Eduard Roux, MD [facility] 11:48 AM

## 2021-08-06 NOTE — Anesthesia Procedure Notes (Signed)
Procedure Name: MAC Date/Time: 08/06/2021 10:27 AM Performed by: Annamary Carolin, CRNA Pre-anesthesia Checklist: Patient identified, Emergency Drugs available, Suction available and Patient being monitored Patient Re-evaluated:Patient Re-evaluated prior to induction Preoxygenation: Pre-oxygenation with 100% oxygen Dental Injury: Teeth and Oropharynx as per pre-operative assessment

## 2021-08-06 NOTE — Evaluation (Signed)
Physical Therapy Evaluation Patient Details Name: [name] MRN: [id number] DOB: 1956-05-11 Today's Date: 08/06/2021  History of Present Illness  Pt is a 65 y/o male s/p L TKA on 10/3. PMH includes DVT, HTN, PE, and DM.  Clinical Impression  Pt s/p surgery above with deficits below. Pt requiring min A for bed mobility and min guard for transfers using RW. Mobility limited to chair secondary to pain. Reviewed knee precautions with pt. Will continue to follow acutely.        Recommendations for follow up therapy are one component of a multi-disciplinary discharge planning process, led by the attending physician.  Recommendations may be updated based on patient status, additional functional criteria and insurance authorization.  Follow Up Recommendations Follow surgeon's recommendation for DC plan and follow-up therapies    Equipment Recommendations  3in1 (PT)    Recommendations for Other Services       Precautions / Restrictions Precautions Precautions: Knee Precaution Booklet Issued: No Precaution Comments: Verbally reviewed knee precautions. Restrictions Weight Bearing Restrictions: Yes LLE Weight Bearing: Weight bearing as tolerated      Mobility  Bed Mobility Overal bed mobility: Needs Assistance Bed Mobility: Supine to Sit     Supine to sit: Min assist     General bed mobility comments: Min A for LLE assist. Increased time required    Transfers Overall transfer level: Needs assistance Equipment used: Rolling walker (2 wheeled) Transfers: Sit to/from [facility] Sit to Stand: Min guard Stand pivot transfers: Min guard       General transfer comment: Min guard for safety to stand and transfer to chair. Increased pain reported so further mobility limited.  Ambulation/Gait                Stairs            Wheelchair Mobility    Modified Rankin (Stroke Patients Only)       Balance Overall balance assessment: Needs  assistance Sitting-balance support: No upper extremity supported;Feet supported Sitting balance-Leahy Scale: Fair     Standing balance support: Bilateral upper extremity supported Standing balance-Leahy Scale: Poor Standing balance comment: Reliant on BUE support                             Pertinent Vitals/Pain Pain Assessment: Faces Faces Pain Scale: Hurts whole lot Pain Location: L knee Pain Descriptors / Indicators: Aching;Operative site guarding Pain Intervention(s): Limited activity within patient's tolerance;Monitored during session;Repositioned    Home Living Family/patient expects to be discharged to:: Private residence Living Arrangements: Spouse/significant other Available Help at Discharge: Family Type of Home: House Home Access: Stairs to enter Entrance Stairs-Rails: Right Entrance Stairs-Number of Steps: 5 Home Layout: Two level;Able to live on main level with bedroom/bathroom Home Equipment: Gilford Rile - 2 wheels      Prior Function Level of Independence: Independent               Hand Dominance        Extremity/Trunk Assessment   Upper Extremity Assessment Upper Extremity Assessment: Defer to OT evaluation    Lower Extremity Assessment Lower Extremity Assessment: LLE deficits/detail LLE Deficits / Details: Deficits consistent with post op pain and weakness.    Cervical / Trunk Assessment Cervical / Trunk Assessment: Normal  Communication   Communication: No difficulties  Cognition Arousal/Alertness: Awake/alert Behavior During Therapy: WFL for tasks assessed/performed Overall Cognitive Status: Within Functional Limits for tasks assessed  General Comments      Exercises     Assessment/Plan    PT Assessment Patient needs continued PT services  PT Problem List Decreased strength;Decreased activity tolerance;Decreased mobility;Decreased balance;Decreased knowledge of use  of DME;Decreased knowledge of precautions;Pain       PT Treatment Interventions DME instruction;Gait training;Stair training;Functional mobility training;Therapeutic activities;Therapeutic exercise;Balance training;Patient/family education    PT Goals (Current goals can be found in the Care Plan section)  Acute Rehab PT Goals Patient Stated Goal: to go home PT Goal Formulation: With patient Time For Goal Achievement: 08/20/21 Potential to Achieve Goals: Good    Frequency 7X/week   Barriers to discharge        Co-evaluation               AM-PAC PT "6 Clicks" Mobility  Outcome Measure Help needed turning from your back to your side while in a flat bed without using bedrails?: A Little Help needed moving from lying on your back to sitting on the side of a flat bed without using bedrails?: A Little Help needed moving to and from a bed to a chair (including a wheelchair)?: A Little Help needed standing up from a chair using your arms (e.g., wheelchair or bedside chair)?: A Little Help needed to walk in hospital room?: A Little Help needed climbing 3-5 steps with a railing? : A Lot 6 Click Score: 17    End of Session Equipment Utilized During Treatment: Gait belt Activity Tolerance: Patient limited by pain Patient left: in chair;with call bell/phone within reach Nurse Communication: Mobility status PT Visit Diagnosis: Other abnormalities of gait and mobility (R26.89);Muscle weakness (generalized) (M62.81);Pain Pain - Right/Left: Left Pain - part of body: Knee    Time: 1525-1540 PT Time Calculation (min) (ACUTE ONLY): 15 min   Charges:   PT Evaluation $PT Eval Low Complexity: 1 Low          [name], DPT  Acute Rehabilitation Services  Pager: [phone] Office: [phone]   [name] 08/06/2021, 4:17 PM

## 2021-08-06 NOTE — Transfer of Care (Signed)
Immediate Anesthesia Transfer of Care Note  Patient: [name]  Procedure(s) Performed: LEFT TOTAL KNEE ARTHROPLASTY (Left: Knee)  Patient Location: PACU  Anesthesia Type:MAC, Regional and Spinal  Level of Consciousness: awake, alert , oriented and patient cooperative  Airway & Oxygen Therapy: Patient Spontanous Breathing and Patient connected to nasal cannula oxygen  Post-op Assessment: Report given to RN, Post -op Vital signs reviewed and stable and Patient moving all extremities X 4  Post vital signs: Reviewed and stable  Last Vitals:  Vitals Value Taken Time  BP 122/75 08/06/21 1227  Temp    Pulse 81 08/06/21 1228  Resp 10 08/06/21 1228  SpO2 100 % 08/06/21 1228  Vitals shown include unvalidated device data.  Last Pain:  Vitals:   08/06/21 0815  TempSrc:   PainSc: 4          Complications: No notable events documented.

## 2021-08-06 NOTE — Anesthesia Procedure Notes (Signed)
Anesthesia Regional Block: Adductor canal block   Pre-Anesthetic Checklist: , timeout performed,  Correct Patient, Correct Site, Correct Laterality,  Correct Procedure, Correct Position, site marked,  Risks and benefits discussed,  Surgical consent,  Pre-op evaluation,  At surgeon's request and post-op pain management  Laterality: Left  Prep: chloraprep       Needles:  Injection technique: Single-shot  Needle Type: Echogenic Needle     Needle Length: 10cm  Needle Gauge: 21     Additional Needles:   Narrative:  Start time: 08/06/2021 9:45 AM End time: 08/06/2021 9:48 AM Injection made incrementally with aspirations every 5 mL.  Performed by: Personally  Anesthesiologist: Audry Pili, MD  Additional Notes: No pain on injection. No increased resistance to injection. Injection made in 5cc increments. Good needle visualization. Patient tolerated the procedure well.

## 2021-08-06 NOTE — Progress Notes (Addendum)
At time of hand off, pt due for blood sugar check. CBG 61. Pt given 12.5g of D50. Pt A&Ox4, skin is warm and dry. CRNA at bedside.

## 2021-08-06 NOTE — Anesthesia Procedure Notes (Signed)
Spinal  Patient location during procedure: OR Start time: 08/06/2021 10:18 AM End time: 08/06/2021 10:22 AM Reason for block: surgical anesthesia Staffing Performed: anesthesiologist  Anesthesiologist: Audry Pili, MD Preanesthetic Checklist Completed: patient identified, IV checked, risks and benefits discussed, surgical consent, monitors and equipment checked, pre-op evaluation and timeout performed Spinal Block Patient position: sitting Prep: DuraPrep Patient monitoring: heart rate, cardiac monitor, continuous pulse ox and blood pressure Approach: midline Location: L3-4 Injection technique: single-shot Needle Needle type: Pencan  Needle gauge: 24 G Additional Notes Consent was obtained prior to the procedure with all questions answered and concerns addressed. Risks including, but not limited to, bleeding, infection, nerve damage, paralysis, failed block, inadequate analgesia, allergic reaction, high spinal, itching, and headache were discussed and the patient wished to proceed. Functioning IV was confirmed and monitors were applied. Sterile prep and drape, including hand hygiene, mask, and sterile gloves were used. The patient was positioned and the spine was prepped. The skin was anesthetized with lidocaine. Free flow of clear CSF was obtained prior to injecting local anesthetic into the CSF. The spinal needle aspirated freely following injection. The needle was carefully withdrawn. The patient tolerated the procedure well.   Renold Don, MD

## 2021-08-06 NOTE — Discharge Instructions (Signed)

## 2021-08-06 NOTE — Progress Notes (Addendum)
ANTICOAGULATION CONSULT NOTE - Follow Up Consult  Pharmacy Consult for Warfarin dosing Indication: VTE treatment  No Known Allergies  Patient Measurements: Height: 6\' 1"  (185.4 cm) Weight: 106.6 kg (235 lb) IBW/kg (Calculated) : 79.9  Vital Signs: Temp: 97.5 F (36.4 C) (10/03 1438) Temp Source: Oral (10/03 1438) BP: 152/89 (10/03 1438) Pulse Rate: 81 (10/03 1438)  Labs: Recent Labs    08/06/21 0759  APTT 29  LABPROT 15.5*  INR 1.2    Estimated Creatinine Clearance: 54.7 mL/min (A) (by C-G formula based on SCr of 1.68 mg/dL (H)).   Medications:  Scheduled:   acetaminophen  1,000 mg Oral Q6H   [START ON 08/07/2021] amLODipine  10 mg Oral Daily   [START ON 08/07/2021] dexamethasone (DECADRON) injection  10 mg Intravenous Once   docusate sodium  100 mg Oral BID   insulin aspart  0-15 Units Subcutaneous TID WC   insulin aspart  0-5 Units Subcutaneous QHS   warfarin  5 mg Oral Once   Warfarin - Pharmacist Dosing Inpatient   Does not apply q1600    Assessment: 65 yo M presenting for total knee arthroplasty. Patient has a history of PE (2009) and DVT (02/09/20). Patient had been holding warfarin and utilizing enoxaparin as a bridge for procedure.   10/3 INR 1.2 on admission  Home regimen: warfarin 7.5mg  WedThursSat, 5mg  Sun, Mon, Tues,Fri Previously therapeutic with INR of 2.2 on this regimen  Goal of Therapy:  INR 2-3 Monitor platelets by anticoagulation protocol: Yes   Plan:  Will resume warfarin 5mg  x1 today and reassess for signs/symptoms of bleed tomorrow If patient tolerates dose today, consider resuming home regimen tomorrow.  Daily INR ordered Continue to monitor for signs/symptoms of bleed along with DDI while admitted.   Thank you for allowing pharmacy to be a part of this patient's care.  Donnald Garre, PharmD Clinical Pharmacist  Please check AMION for all [place] numbers After 10:00 PM, call [place] [phone]

## 2021-08-07 DIAGNOSIS — M1712 Unilateral primary osteoarthritis, left knee: Secondary | ICD-10-CM | POA: Diagnosis not present

## 2021-08-07 LAB — GLUCOSE, CAPILLARY: Glucose-Capillary: 173 mg/dL — ABNORMAL HIGH (ref 70–99)

## 2021-08-07 LAB — CBC
HCT: 41.4 % (ref 39.0–52.0)
Hemoglobin: 13.4 g/dL (ref 13.0–17.0)
MCH: 29.9 pg (ref 26.0–34.0)
MCHC: 32.4 g/dL (ref 30.0–36.0)
MCV: 92.4 fL (ref 80.0–100.0)
Platelets: 155 10*3/uL (ref 150–400)
RBC: 4.48 MIL/uL (ref 4.22–5.81)
RDW: 13.4 % (ref 11.5–15.5)
WBC: 7.6 10*3/uL (ref 4.0–10.5)
nRBC: 0 % (ref 0.0–0.2)

## 2021-08-07 LAB — BASIC METABOLIC PANEL
Anion gap: 8 (ref 5–15)
BUN: 22 mg/dL (ref 8–23)
CO2: 26 mmol/L (ref 22–32)
Calcium: 8.4 mg/dL — ABNORMAL LOW (ref 8.9–10.3)
Chloride: 100 mmol/L (ref 98–111)
Creatinine, Ser: 1.77 mg/dL — ABNORMAL HIGH (ref 0.61–1.24)
GFR, Estimated: 42 mL/min — ABNORMAL LOW (ref >60)
Glucose, Bld: 222 mg/dL — ABNORMAL HIGH (ref 70–99)
Potassium: 5.6 mmol/L — ABNORMAL HIGH (ref 3.5–5.1)
Sodium: 134 mmol/L — ABNORMAL LOW (ref 135–145)

## 2021-08-07 LAB — PROTIME-INR
INR: 1.3 — ABNORMAL HIGH (ref 0.8–1.2)
Prothrombin Time: 15.8 seconds — ABNORMAL HIGH (ref 11.4–15.2)

## 2021-08-07 MED ORDER — OXYCODONE HCL 5 MG PO TABS
5.0000 mg | ORAL_TABLET | ORAL | Status: DC | PRN
  Administered 2021-08-07 (×2): 10 mg via ORAL
  Filled 2021-08-07 (×2): qty 2

## 2021-08-07 MED ORDER — WARFARIN SODIUM 5 MG PO TABS
5.0000 mg | ORAL_TABLET | Freq: Once | ORAL | Status: DC
  Filled 2021-08-07: qty 1

## 2021-08-07 MED ORDER — LOSARTAN POTASSIUM 50 MG PO TABS
50.0000 mg | ORAL_TABLET | Freq: Every day | ORAL | Status: DC
  Administered 2021-08-07: 50 mg via ORAL
  Filled 2021-08-07: qty 1

## 2021-08-07 MED ORDER — TAMSULOSIN HCL 0.4 MG PO CAPS
0.4000 mg | ORAL_CAPSULE | Freq: Every day | ORAL | Status: DC
  Administered 2021-08-07 (×2): 0.4 mg via ORAL
  Filled 2021-08-07: qty 1

## 2021-08-07 MED ORDER — OXYCODONE HCL 5 MG PO TABS: 10.0000 mg | ORAL_TABLET | ORAL | Status: DC | PRN

## 2021-08-07 MED ORDER — METHOCARBAMOL 500 MG PO TABS
500.0000 mg | ORAL_TABLET | Freq: Four times a day (QID) | ORAL | Status: DC | PRN
  Administered 2021-08-07: 500 mg via ORAL
  Filled 2021-08-07: qty 1

## 2021-08-07 NOTE — Discharge Summary (Signed)
Patient ID: [name] MRN: [id number] DOB/AGE: 01-02-1956 65 y.o.  Admit date: 08/06/2021 Discharge date: 08/07/2021  Admission Diagnoses:  Principal Problem:   Primary osteoarthritis of left knee Active Problems:   Status post total left knee replacement   Discharge Diagnoses:  Same  Past Medical History:  Diagnosis Date   Arthritis    Chronic kidney disease    reduced kidney function. Stage III   Diabetes mellitus without complication ([place])    DVT (deep venous thrombosis) [facility])    age indeterminate LLE DVT 02/09/20   Dyspnea    On exertion   History of kidney stones    History of pulmonary embolus (PE)    Hypertension    Lipidemia    Prostate enlargement    Pulmonary embolism ([place]) 2009   Sleep apnea     Surgeries: Procedure(s): LEFT TOTAL KNEE ARTHROPLASTY on 08/06/2021   Consultants:   Discharged Condition: Improved  Hospital Course: [name] is an 65 y.o. male who was admitted 08/06/2021 for operative treatment ofPrimary osteoarthritis of left knee. Patient has severe unremitting pain that affects sleep, daily activities, and work/hobbies. After pre-op clearance the patient was taken to the operating room on 08/06/2021 and underwent  Procedure(s): LEFT TOTAL KNEE ARTHROPLASTY.    Patient was given perioperative antibiotics:  Anti-infectives (From admission, onward)    Start     Dose/Rate Route Frequency Ordered Stop   08/06/21 1600  ceFAZolin (ANCEF) IVPB 2g/100 mL premix        2 g 200 mL/hr over 30 Minutes Intravenous Every 6 hours 08/06/21 1305 08/06/21 2222   08/06/21 0800  ceFAZolin (ANCEF) IVPB 2g/100 mL premix        2 g 200 mL/hr over 30 Minutes Intravenous On call to O.R. 08/06/21 0758 08/06/21 1020   08/06/21 0758  ceFAZolin (ANCEF) 2-4 GM/100ML-% IVPB       Note to Pharmacy: Roosvelt Maser   : cabinet override      08/06/21 0758 08/06/21 1021        Patient was given sequential compression devices, early ambulation, and chemoprophylaxis to  prevent DVT.  Patient benefited maximally from hospital stay and there were no complications.    Recent vital signs: Patient Vitals for the past 24 hrs:  BP Temp Temp src Pulse Resp SpO2  08/07/21 0732 (!) 147/77 98.1 F (36.7 C) Oral 82 18 98 %  08/07/21 0340 (!) 118/96 98.2 F (36.8 C) Oral (!) 102 18 100 %  08/06/21 2334 133/77 98.1 F (36.7 C) Oral 87 20 100 %  08/06/21 1913 131/86 98.3 F (36.8 C) Oral 80 20 100 %  08/06/21 1438 (!) 152/89 (!) 97.5 F (36.4 C) Oral 81 18 100 %  08/06/21 1433 -- -- -- -- -- 100 %  08/06/21 1415 138/77 -- -- 70 (!) 5 100 %  08/06/21 1412 138/77 98.7 F (37.1 C) -- 70 -- --  08/06/21 1357 (!) 150/79 -- -- 76 17 99 %  08/06/21 1342 139/85 -- -- 72 (!) 8 100 %  08/06/21 1327 134/80 -- -- 73 11 100 %  08/06/21 1312 127/85 -- -- 82 13 99 %  08/06/21 1257 125/75 -- -- 82 14 100 %  08/06/21 1242 112/76 -- -- 85 15 97 %  08/06/21 1227 122/75 97.6 F (36.4 C) -- 85 12 100 %  08/06/21 0955 (!) 158/75 -- -- 71 15 99 %  08/06/21 0950 (!) 180/82 -- -- 71 12 96 %  08/06/21 0945 [name])  175/87 -- -- 71 16 99 %     Recent laboratory studies:  Recent Labs    08/06/21 0759 08/07/21 0504  WBC  --  7.6  HGB  --  13.4  HCT  --  41.4  PLT  --  155  NA  --  134*  K  --  5.6*  CL  --  100  CO2  --  26  BUN  --  22  CREATININE  --  1.77*  GLUCOSE  --  222*  INR 1.2 1.3*  CALCIUM  --  8.4*     Discharge Medications:   Allergies as of 08/07/2021   No Known Allergies      Medication List     STOP taking these medications    acetaminophen 650 MG CR tablet Commonly known as: TYLENOL   traMADol 50 MG tablet Commonly known as: ULTRAM       TAKE these medications    Accu-Chek Aviva device by Other route. Use as instructed   Accu-Chek Guide test strip Generic drug: glucose blood USE TO CHECK BLOOD SUGAR 3 TIMES DAILY.E11.65   Accu-Chek Softclix Lancets lancets Use as instructed to check blood sugar 3 times daily Dx is E11.65    amLODipine 10 MG tablet Commonly known as: NORVASC Take 1 tablet (10 mg total) by mouth daily.   aspirin EC 81 MG tablet Take 81 mg by mouth daily.   BD Insulin Syringe U/F 31G X 5/16" 1 ML Misc Generic drug: Insulin Syringe-Needle U-100 USE AS DIRECTED   cephALEXin 500 MG capsule Commonly known as: Keflex Take 1 capsule (500 mg total) by mouth 4 (four) times daily. To be taken after surgery   diclofenac Sodium 1 % Gel Commonly known as: VOLTAREN Apply 1 application topically 4 (four) times daily as needed (pain).   docusate sodium 100 MG capsule Commonly known as: Colace Take 1 capsule (100 mg total) by mouth daily as needed.   gabapentin 300 MG capsule Commonly known as: NEURONTIN TAKE 1 CAPSULE BY MOUTH FOUR TIMES A DAY   HumuLIN 70/30 (70-30) 100 UNIT/ML injection Generic drug: insulin NPH-regular Human Inject 50 Units into the skin daily before breakfast AND 52 Units daily before supper.   Insulin Syringes (Disposable) U-100 1 ML Misc 1 Device by Does not apply route as directed.   losartan 50 MG tablet Commonly known as: COZAAR Take 50 mg by mouth daily.   lovastatin 40 MG tablet Commonly known as: MEVACOR TAKE 1 TABLET (40 MG TOTAL) BY MOUTH AT BEDTIME. **NEEDS APPT BEFORE NEXT REFILL**   methocarbamol 500 MG tablet Commonly known as: Robaxin Take 1 tablet (500 mg total) by mouth 2 (two) times daily as needed. To be taken after surgery   montelukast 10 MG tablet Commonly known as: SINGULAIR Take 1 tablet (10 mg total) by mouth at bedtime.   ondansetron 4 MG tablet Commonly known as: Zofran Take 1 tablet (4 mg total) by mouth every 8 (eight) hours as needed for nausea or vomiting.   oxyCODONE-acetaminophen 5-325 MG tablet Commonly known as: Percocet Take 1-2 tablets by mouth every 6 (six) hours as needed. To be taken after surgery   Trulicity 1.5 TG/2.5WL Sopn Generic drug: Dulaglutide Inject 1.5 mg into the skin once a week. What changed: when to  take this   warfarin 5 MG tablet Commonly known as: COUMADIN Take as directed. If you are unsure how to take this medication, talk to your nurse or doctor. Original instructions:  Take 1 tablet (5 mg total) by mouth daily. Take as directed. What changed:  how much to take when to take this additional instructions               Durable Medical Equipment  (From admission, onward)           Start     Ordered   08/06/21 1433  DME Walker rolling  Once       Question Answer Comment  Walker: With [place]   Patient needs a walker to treat with the following condition Status post left partial knee replacement      08/06/21 1432   08/06/21 1433  DME 3 n 1  Once        08/06/21 1432   08/06/21 1433  DME Bedside commode  Once       Question:  Patient needs a bedside commode to treat with the following condition  Answer:  Status post left partial knee replacement   08/06/21 1432            Diagnostic Studies: DG Knee Left Port  Result Date: 08/06/2021 CLINICAL DATA:  Postop knee pain EXAM: PORTABLE LEFT KNEE - 1-2 VIEW COMPARISON:  Knee radiographs 06/30/2020 FINDINGS: There has been interval left total knee arthroplasty. Hardware alignment is within expected limits, without evidence of hardware related complication. Surrounding soft tissue swelling and soft tissue gas is consistent with recent operation. IMPRESSION: Status post left knee arthroplasty without evidence of immediate complication. Electronically Signed   By: Valetta Mole M.D.   On: 08/06/2021 14:26    Disposition: Discharge disposition: 01-Home or Self Care          Follow-up Information     Leandrew Koyanagi, MD. Schedule an appointment as soon as possible for a visit in 2 week(s).   Specialty: Orthopedic Surgery Contact information: [address] [place] [address] [phone]                  Signed: Aundra Dubin 08/07/2021, 8:14 AM

## 2021-08-07 NOTE — Progress Notes (Signed)
ANTICOAGULATION CONSULT NOTE - Follow Up Consult  Pharmacy Consult for Warfarin dosing Indication: VTE treatment  No Known Allergies  Patient Measurements: Height: 6\' 1"  (185.4 cm) Weight: 106.6 kg (235 lb) IBW/kg (Calculated) : 79.9  Vital Signs: Temp: 98.1 F (36.7 C) (10/04 0732) Temp Source: Oral (10/04 0732) BP: 147/77 (10/04 0732) Pulse Rate: 82 (10/04 0732)  Labs: Recent Labs    08/06/21 0759 08/07/21 0504  HGB  --  13.4  HCT  --  41.4  PLT  --  155  APTT 29  --   LABPROT 15.5* 15.8*  INR 1.2 1.3*  CREATININE  --  1.77*     Estimated Creatinine Clearance: 51.9 mL/min (A) (by C-G formula based on SCr of 1.77 mg/dL (H)).   Medications:  Scheduled:   amLODipine  10 mg Oral Daily   docusate sodium  100 mg Oral BID   gabapentin  300 mg Oral QID   insulin aspart  0-15 Units Subcutaneous TID WC   insulin aspart  0-5 Units Subcutaneous QHS   losartan  50 mg Oral Daily   tamsulosin  0.4 mg Oral Daily   warfarin  5 mg Oral ONCE-1600   Warfarin - Pharmacist Dosing Inpatient   Does not apply q1600    Assessment: 65 yo M presenting for total knee arthroplasty. Patient has a history of PE (2009) and DVT (02/09/20). Patient had been holding warfarin and utilizing enoxaparin as a bridge for procedure.   10/4 INR 1.3  Home regimen: warfarin 7.5mg  WedThursSat, 5mg  Sun, Mon, Tues,Fri Previously therapeutic with INR of 2.2 on this regimen  Goal of Therapy:  INR 2-3 Monitor platelets by anticoagulation protocol: Yes   Plan:  Continue warfarin 5mg  Current plan to discharge home today. If patient remains inpatient, will schedule home warfarin regimen.  Daily INR ordered Continue to monitor for signs/symptoms of bleed along with DDI while admitted.   Thank you for allowing pharmacy to be a part of this patient's care.  Donnald Garre, PharmD Clinical Pharmacist  Please check AMION for all [place] numbers After 10:00 PM, call [place] [phone]

## 2021-08-07 NOTE — Progress Notes (Signed)
Patient alert and oriented, mae's well, voiding adequate amount of urine, swallowing without difficulty, no c/o pain at time of discharge. Patient discharged home with family. Script and discharged instructions given to patient. Patient and family stated understanding of instructions given. Patient has an appointment with Dr. Erlinda Hong on October 18th.

## 2021-08-07 NOTE — TOC Progression Note (Signed)
Transition of Care [facility]) - Progression Note    Patient Details  Name: [name] MRN: [id number] Date of Birth: 25-Jun-1956  Transition of Care [facility]) CM/SW [place], RN Phone Number:[phone]  08/07/2021, 11:53 AM  Clinical Narrative:    TOC consulted for [facility] needs. CM at bedside to offer choice. HH has been set up with [facility] and info has been added to the avs. No other needs noted at this time. TOC will sign off.        Expected Discharge Plan and Services           Expected Discharge Date: 08/07/21                                     Social Determinants of Health (SDOH) Interventions    Readmission Risk Interventions No flowsheet data found.

## 2021-08-07 NOTE — Evaluation (Signed)
Occupational Therapy Evaluation Patient Details Name: [name] MRN: [id number] DOB: August 17, 1956 Today's Date: 08/07/2021   History of Present Illness Pt is a 65 y/o male s/p L TKA on 10/3. PMH includes DVT, HTN, PE, and DM.   Clinical Impression   Pt agreed to session. Pt at this time with increase time was able to complete LE dressing with supervision due to pain and education on how to use AE as needed. Pt self reported when they go home they plan to use a high profile airmattress on the main level of the home when they return home but spoke with Physical Therapy and they reported they would be able to go onto the second floor to sleep in standard bed. Also educated pt about a tub transfer bench with entering/exiting the shower but decline the need at this time. Pt currently with functional limitations due to the deficits listed below (see OT Problem List).  Pt will benefit from skilled OT to increase their safety and independence with ADL and functional mobility for ADL to facilitate discharge to venue listed below.        Recommendations for follow up therapy are one component of a multi-disciplinary discharge planning process, led by the attending physician.  Recommendations may be updated based on patient status, additional functional criteria and insurance authorization.   Follow Up Recommendations  Follow surgeon's recommendation for DC plan and follow-up therapies;Supervision - Intermittent    Equipment Recommendations  3 in 1 bedside commode;Other (comment) (Pt was educated about transfer bench use but decline at this time use)    Recommendations for Other Services       Precautions / Restrictions Precautions Precautions: Knee Precaution Booklet Issued: No Restrictions Weight Bearing Restrictions: Yes LLE Weight Bearing: Weight bearing as tolerated      Mobility Bed Mobility Overal bed mobility:  (presented in sitting)                  Transfers Overall  transfer level: Needs assistance Equipment used: Rolling walker (2 wheeled) Transfers: Sit to/from Stand Sit to Stand: Min guard (cues on hand placement but good follow through)              Balance Overall balance assessment: Needs assistance Sitting-balance support: No upper extremity supported;Feet supported Sitting balance-Leahy Scale: Good     Standing balance support: During functional activity;Bilateral upper extremity supported Standing balance-Leahy Scale: Poor Standing balance comment: Reliant on BUE support                           ADL either performed or assessed with clinical judgement   ADL Overall ADL's : Needs assistance/impaired Eating/Feeding: Independent;Sitting   Grooming: Wash/dry hands;Wash/dry face;Supervision/safety;Cueing for safety;Cueing for sequencing;Standing   Upper Body Bathing: Cueing for safety;Cueing for sequencing;Sitting;Modified independent   Lower Body Bathing: Cueing for safety;Cueing for sequencing;Sit to/from stand;Supervison/ safety   Upper Body Dressing : Modified independent;Sitting   Lower Body Dressing: Supervision/safety;Cueing for safety;Cueing for sequencing;Sit to/from stand   Toilet Transfer: Min guard;Cueing for safety;Cueing for sequencing   Toileting- Clothing Manipulation and Hygiene: Supervision/safety;Cueing for safety;Cueing for sequencing;Sit to/from Nurse, children's Details (indicate cue type and reason): Pt reported to this therapist on how they have a bathtub and was educated about use of transfer bench but they decline the need of this at this time. Functional mobility during ADLs: Min guard;Cueing for safety;Cueing for sequencing;Rolling walker  Vision Baseline Vision/History: 1 Wears glasses Ability to See in Adequate Light: 0 Adequate Patient Visual Report: No change from baseline       Perception     Praxis      Pertinent Vitals/Pain Pain Assessment: Faces Faces  Pain Scale: Hurts a little bit Pain Location: L knee Pain Descriptors / Indicators: Aching;Operative site guarding Pain Intervention(s): Limited activity within patient's tolerance;Monitored during session;Repositioned     Hand Dominance     Extremity/Trunk Assessment Upper Extremity Assessment Upper Extremity Assessment: Overall WFL for tasks assessed   Lower Extremity Assessment Lower Extremity Assessment: LLE deficits/detail LLE Deficits / Details: Deficits consistent with post op pain and weakness. LLE Sensation: WNL   Cervical / Trunk Assessment Cervical / Trunk Assessment: Normal   Communication Communication Communication: No difficulties   Cognition Arousal/Alertness: Awake/alert Behavior During Therapy: WFL for tasks assessed/performed Overall Cognitive Status: Within Functional Limits for tasks assessed                                     General Comments       Exercises     Shoulder Instructions      Home Living Family/patient expects to be discharged to:: Private residence Living Arrangements: Spouse/significant other Available Help at Discharge: Family Type of Home: House Home Access: Stairs to enter Technical brewer of Steps: 5 Entrance Stairs-Rails: Right Home Layout: Two level;Able to live on main level with bedroom/bathroom     Bathroom Shower/Tub: Teacher, early years/pre: Standard     Home Equipment: Walker - 2 wheels          Prior Functioning/Environment Level of Independence: Independent                 OT Problem List: Impaired balance (sitting and/or standing);Decreased knowledge of use of DME or AE;Pain;Decreased safety awareness;Decreased activity tolerance      OT Treatment/Interventions: Self-care/ADL training;Therapeutic exercise;DME and/or AE instruction;Therapeutic activities;Patient/family education;Balance training    OT Goals(Current goals can be found in the care plan section)  Acute Rehab OT Goals Patient Stated Goal: to go home OT Goal Formulation: With patient Time For Goal Achievement: 08/18/21 Potential to Achieve Goals: Good ADL Goals Pt Will Perform Lower Body Bathing: Independently;sit to/from stand Pt Will Transfer to Toilet: with modified independence;ambulating;bedside commode Pt Will Perform Tub/Shower Transfer: with modified independence;ambulating;tub bench;rolling walker Additional ADL Goal #1: Pt will be able to complete bed mobility with AE/DME  OT Frequency: Min 2X/week   Barriers to D/C:            Co-evaluation              AM-PAC OT "6 Clicks" Daily Activity     Outcome Measure Help from another person eating meals?: None Help from another person taking care of personal grooming?: None Help from another person toileting, which includes using toliet, bedpan, or urinal?: None Help from another person bathing (including washing, rinsing, drying)?: A Little Help from another person to put on and taking off regular upper body clothing?: None Help from another person to put on and taking off regular lower body clothing?: A Little 6 Click Score: 22   End of Session Equipment Utilized During Treatment: Rolling walker  Activity Tolerance: Patient tolerated treatment well Patient left: with call bell/phone within reach  OT Visit Diagnosis: Unsteadiness on feet (R26.81);Other abnormalities of gait and mobility (R26.89);Pain Pain - Right/Left: Left  Pain - part of body: Knee                Time: 2575-0518 OT Time Calculation (min): 18 min Charges:  OT General Charges $OT Visit: 1 Visit OT Evaluation $OT Eval Low Complexity: [place] OTR/L  Acute Rehab Services  [phone] office number [phone] pager number   Joeseph Amor 08/07/2021, 8:39 AM

## 2021-08-07 NOTE — Progress Notes (Signed)
Physical Therapy Treatment Patient Details Name: [name] MRN: [id number] DOB: 1956/01/19 Today's Date: 08/07/2021   History of Present Illness Pt is a 65 y/o male s/p L TKA on 10/3. PMH includes DVT, HTN, PE, and DM.    PT Comments    Pt progressing well. Pt safely negotiated 5 steps to enter home and began step through gait training with RW without L knee buckling. Pt given L knee HEP. Acute PT to cont to follow. Pt would benefit from HHPT to progress toward independence without RW and L knee strengthening.    Recommendations for follow up therapy are one component of a multi-disciplinary discharge planning process, led by the attending physician.  Recommendations may be updated based on patient status, additional functional criteria and insurance authorization.  Follow Up Recommendations  Follow surgeon's recommendation for DC plan and follow-up therapies     Equipment Recommendations  3in1 (PT)    Recommendations for Other Services       Precautions / Restrictions Precautions Precautions: Knee Precaution Booklet Issued: No Precaution Comments: Verbally reviewed knee precautions. Restrictions Weight Bearing Restrictions: Yes LLE Weight Bearing: Weight bearing as tolerated     Mobility  Bed Mobility Overal bed mobility: Modified Independent Bed Mobility: Supine to Sit     Supine to sit: Modified independent (Device/Increase time)     General bed mobility comments: verbal cues to perform L knee quad set and then slide LE off EOB, pt able to come up onto elbows and then push up onto bilat UEs    Transfers Overall transfer level: Needs assistance Equipment used: Rolling walker (2 wheeled) Transfers: Sit to/from Stand Sit to Stand: Min guard (verbal cues on hand placement)         General transfer comment: no physical assist required, verbal cues for hand placement (push up from bed)  Ambulation/Gait Ambulation/Gait assistance: Min guard Gait Distance  (Feet): 150 Feet Assistive device: Rolling walker (2 wheeled) Gait Pattern/deviations: Step-through pattern;Decreased stride length;Trunk flexed;Decreased weight shift to left Gait velocity: dec Gait velocity interpretation: <1.31 ft/sec, indicative of household ambulator General Gait Details: pt initially step to gait pattern, verbal cues to peform L quad set when advancing R LE to minimize L knee buckling with WBing. Pt then transitioned to step through gait pattern after assist with maintaining RW into forward momentum   Stairs Stairs: Yes Stairs assistance: Min guard Stair Management: One rail Right Number of Stairs: 5 (to mimic home entry) General stair comments: pt with good understanding of up with the good, down with the bad   Wheelchair Mobility    Modified Rankin (Stroke Patients Only)       Balance Overall balance assessment: Needs assistance Sitting-balance support: No upper extremity supported;Feet supported Sitting balance-Leahy Scale: Good     Standing balance support: During functional activity;Bilateral upper extremity supported Standing balance-Leahy Scale: Fair Standing balance comment: pt with good static standing balance but requires bilat UE support for ambulation                            Cognition Arousal/Alertness: Awake/alert Behavior During Therapy: WFL for tasks assessed/performed Overall Cognitive Status: Within Functional Limits for tasks assessed                                 General Comments: pt with good question regarding car ie. dressing change and driving  Exercises Total Joint Exercises Ankle Circles/Pumps: AROM;Both;10 reps;Supine Quad Sets: AROM;Left;10 reps;Supine (with 3 sec hold) Heel Slides: AAROM;Left;10 reps;Seated (L foot on towel, pt initiated and PT assisted to gain increase flex to point pt could tolerate) Goniometric ROM: 35 (deg actively, 65 passively in sitting)    General Comments  General comments (skin integrity, edema, etc.): pt with noted drainage on L knee bandage, Mendel Ryder, PA aware      Pertinent Vitals/Pain Pain Assessment: 0-10 Pain Score: 5  Faces Pain Scale: Hurts a little bit Pain Location: L knee with ROM, 1/10 at rest Pain Descriptors / Indicators: Aching;Operative site guarding Pain Intervention(s): Monitored during session    Home Living Family/patient expects to be discharged to:: Private residence Living Arrangements: Spouse/significant other Available Help at Discharge: Family Type of Home: House Home Access: Stairs to enter Entrance Stairs-Rails: Right Home Layout: Two level;Able to live on main level with bedroom/bathroom Home Equipment: Walker - 2 wheels      Prior Function Level of Independence: Independent          PT Goals (current goals can now be found in the care plan section) Acute Rehab PT Goals Patient Stated Goal: go home today PT Goal Formulation: With patient Time For Goal Achievement: 08/20/21 Potential to Achieve Goals: Good Progress towards PT goals: Progressing toward goals    Frequency    7X/week      PT Plan Current plan remains appropriate    Co-evaluation              AM-PAC PT "6 Clicks" Mobility   Outcome Measure  Help needed turning from your back to your side while in a flat bed without using bedrails?: A Little Help needed moving from lying on your back to sitting on the side of a flat bed without using bedrails?: A Little Help needed moving to and from a bed to a chair (including a wheelchair)?: A Little Help needed standing up from a chair using your arms (e.g., wheelchair or bedside chair)?: A Little Help needed to walk in hospital room?: A Little Help needed climbing 3-5 steps with a railing? : A Little 6 Click Score: 18    End of Session Equipment Utilized During Treatment: Gait belt Activity Tolerance: Patient tolerated treatment well Patient left: with call bell/phone within  reach;in bed (sitting EOB, OT present) Nurse Communication: Mobility status PT Visit Diagnosis: Other abnormalities of gait and mobility (R26.89);Muscle weakness (generalized) (M62.81);Pain Pain - Right/Left: Left Pain - part of body: Knee     Time: 0727-0755 PT Time Calculation (min) (ACUTE ONLY): 28 min  Charges:  $Gait Training: 8-22 mins $Therapeutic Exercise: 8-22 mins                     Kittie Plater, PT, DPT Acute Rehabilitation Services Pager #: [phone] Office #: [phone]    Berline Lopes 08/07/2021, 8:51 AM

## 2021-08-07 NOTE — Progress Notes (Addendum)
Subjective: 1 Day Post-Op Procedure(s) (LRB): LEFT TOTAL KNEE ARTHROPLASTY (Left) Patient reports pain as mild.    Objective: Vital signs in last 24 hours: Temp:  [97.5 F (36.4 C)-98.7 F (37.1 C)] 98.1 F (36.7 C) (10/04 0732) Pulse Rate:  [70-102] 82 (10/04 0732) Resp:  [5-20] 18 (10/04 0732) BP: (112-180)/(75-96) 147/77 (10/04 0732) SpO2:  [96 %-100 %] 98 % (10/04 0732) FiO2 (%):  [21 %] 21 % (10/03 1433)  Intake/Output from previous day: 10/03 0701 - 10/04 0700 In: 1150 [I.V.:950; IV Piggyback:200] Out: 3750 [Urine:3700; Blood:50] Intake/Output this shift: No intake/output data recorded.  Recent Labs    08/07/21 0504  HGB 13.4   Recent Labs    08/07/21 0504  WBC 7.6  RBC 4.48  HCT 41.4  PLT 155   Recent Labs    08/07/21 0504  NA 134*  K 5.6*  CL 100  CO2 26  BUN 22  CREATININE 1.77*  GLUCOSE 222*  CALCIUM 8.4*   Recent Labs    08/06/21 0759 08/07/21 0504  INR 1.2 1.3*    Neurologically intact Neurovascular intact Sensation intact distally Intact pulses distally Dorsiflexion/Plantar flexion intact Incision: scant drainage No cellulitis present Compartment soft   Assessment/Plan: 1 Day Post-Op Procedure(s) (LRB): LEFT TOTAL KNEE ARTHROPLASTY (Left) Advance diet Up with therapy Discharge home with home health WBAT LLE Was given flomax this am for urinary retention.  Waiting to void. INR this am 1.3. ok to resume home dose of coumadin Ok to d/c from PT standpoint, but need to make sure urinary retention has resolved a Anticipated LOS equal to or greater than 2 midnights due to - Age 7 and older with one or more of the following:  - Obesity  - Expected need for hospital services (PT, OT, Nursing) required for safe  discharge  - Anticipated need for postoperative skilled nursing care or inpatient rehab  - Active co-morbidities: Diabetes and DVT/VTE OR   - Unanticipated findings during/Post Surgery: Slow post-op progression: GI, pain  control, mobility  - Patient is a high risk of re-admission due to: None   Aundra Dubin 08/07/2021, 8:06 AM

## 2021-08-08 ENCOUNTER — Telehealth: Payer: Self-pay | Admitting: Orthopaedic Surgery

## 2021-08-08 ENCOUNTER — Encounter: Payer: Self-pay | Admitting: Orthopaedic Surgery

## 2021-08-08 ENCOUNTER — Encounter (HOSPITAL_COMMUNITY): Payer: Self-pay | Admitting: Orthopaedic Surgery

## 2021-08-08 ENCOUNTER — Telehealth: Payer: Self-pay | Admitting: Family Medicine

## 2021-08-08 NOTE — Telephone Encounter (Signed)
Pt's girlfriend Staci called requesting for cephalexin to be refilled at [facility] on [address]. Please call when script has been sent in at [phone].

## 2021-08-08 NOTE — Telephone Encounter (Signed)
Called Kelly no answer LMOM. Approved orders.

## 2021-08-08 NOTE — Telephone Encounter (Signed)
Returned [employer] call for verbal order, no answer LM with order asked to call back with any concerns.

## 2021-08-08 NOTE — Telephone Encounter (Signed)
Received call from Kelly-(PT) with [facility] needing verbal orders for HHPT  2 Wk 1 and 3  Wk 2. The number to contact Claib[facility] Billings is [phone]

## 2021-08-08 NOTE — Telephone Encounter (Signed)
[name] from [facility] is needing a verbal order for pt to have his PT\INR on 08/13/21 instead of him having to go to the  Coumadin clinic because he just had knee surgery. Please advise [name] at [phone].

## 2021-08-09 ENCOUNTER — Telehealth: Payer: Self-pay | Admitting: Physician Assistant

## 2021-08-09 ENCOUNTER — Encounter: Payer: Self-pay | Admitting: Orthopaedic Surgery

## 2021-08-09 ENCOUNTER — Other Ambulatory Visit: Payer: Self-pay | Admitting: Physician Assistant

## 2021-08-09 MED ORDER — CEPHALEXIN 500 MG PO CAPS: 500.0000 mg | ORAL_CAPSULE | Freq: Four times a day (QID) | ORAL | 0 refills | Status: DC

## 2021-08-09 NOTE — Telephone Encounter (Signed)
Should have ten days worth that he should have started taking after surgery.  Surgery was Monday.  Why does he need a refill already?

## 2021-08-09 NOTE — Telephone Encounter (Signed)
Sent msg to [employer]

## 2021-08-09 NOTE — Telephone Encounter (Signed)
Pt girlfriend stacy called and states [facility]'t [facility]. She is wondering if you can find somewhere has it so they can go pick it up?  CB [phone]

## 2021-08-09 NOTE — Telephone Encounter (Signed)
Oh goodness, I just resent

## 2021-08-09 NOTE — Telephone Encounter (Signed)
Already sent to pharm. Duplicate msg

## 2021-08-10 ENCOUNTER — Telehealth: Payer: Self-pay

## 2021-08-10 ENCOUNTER — Ambulatory Visit (INDEPENDENT_AMBULATORY_CARE_PROVIDER_SITE_OTHER): Payer: Medicare Other | Admitting: Orthopedic Surgery

## 2021-08-10 ENCOUNTER — Other Ambulatory Visit: Payer: Self-pay

## 2021-08-10 DIAGNOSIS — Z96652 Presence of left artificial knee joint: Secondary | ICD-10-CM

## 2021-08-10 NOTE — Telephone Encounter (Signed)
Pt called and stated he is concerned about infection getting worse. He has taken 4 doses of keflex but feels like it is getting worse and states pain in the knee is getting worse. I talked to Dr. Marlou Sa since Dr. Erlinda Hong and lindsey arent in the office and stated he will see the pt this morning to evaluate his knee. Pt scheduled

## 2021-08-11 ENCOUNTER — Encounter: Payer: Self-pay | Admitting: Orthopedic Surgery

## 2021-08-11 NOTE — Progress Notes (Signed)
Post-Op Visit Note   Patient: [name]           Date of Birth: 06-Jan-1956           MRN: [id number] Visit Date: 08/10/2021 PCP: [name], MD   Assessment & Plan:  Chief Complaint:  Chief Complaint  Patient presents with   Left Knee - Wound Check   Visit Diagnoses:  1. Status post left knee replacement     Plan: Patient is a 65 year old male who presents s/p left total knee arthroplasty on 08/06/2021.  Total knee replacement was done by Dr. Erlinda Hong.  Reports that he began to notice increased swelling yesterday after his physical therapy session at home on Wednesday.  He became concerned with the increased swelling and increased redness that he noticed and contacted the office.  He was placed on Keflex, and has taken 5 doses.  He is able to ambulate with a cane.  Taking tramadol with occasional oxycodone for breakthrough pain.  Denies any chest pain, shortness of breath, calf pain.  No fevers, chills, night sweats, drainage from the incision that he has noticed.  On examination incision looks to be healing well with no evidence of significant surrounding erythema or dehiscence.  No sinus tract noted.  He flexes to 80 degrees.  Extension is very difficult due to pain but he is able to reach about 10 degrees.  No calf tenderness.  Negative Homans' sign.  Postoperative radiographs were reviewed with the patient.  No significant concerns for periprosthetic joint infection at this time.  Plan for him to use CPM machine and focus on passive extension primarily until his first postop appointment with Dr. Erlinda Hong.  He will contact the office if any of his symptoms worsen.  Follow-Up Instructions: No follow-ups on file.   Orders:  No orders of the defined types were placed in this encounter.  No orders of the defined types were placed in this encounter.   Imaging: No results found.  PMFS History: Patient Active Problem List   Diagnosis Date Noted   Status post total left knee  replacement 08/06/2021   Encounter for medication monitoring 07/10/2021   UTI (urinary tract infection) 10/09/2020   Acute UTI 10/08/2020   Ureterolithiasis    Gross hematuria 06/16/2020   Renal lithiasis 06/07/2020   Renal stone 06/06/2020   Type 2 diabetes mellitus with stage 3b chronic kidney disease, with long-term current use of insulin ([place]) 05/26/2020   Visual disturbance 03/30/2020   Chronic pain of left knee 03/30/2020   Snores 03/30/2020   Chronic kidney disease, stage 3b ([place]) 03/30/2020   Midline low back pain without sciatica 03/30/2020   Hospital discharge follow-up 02/25/2020   Type 2 diabetes mellitus with diabetic polyneuropathy, with long-term current use of insulin ([place]) 02/23/2020   Syncope 02/08/2020   Renal stones 01/30/2020   Diminished pulses in lower extremity 01/26/2020   AKI (acute kidney injury) ([place]) 01/26/2020   CKD (chronic kidney disease) stage 4, GFR 15-29 ml/min (HCC) 01/05/2020   Hyperkalemia 01/05/2020   Long term (current) use of anticoagulants 12/22/2019   Elevated cholesterol 12/17/2019   Primary osteoarthritis of left knee 12/17/2019   Arthritis 12/17/2019   Type 2 diabetes mellitus with hyperglycemia, with long-term current use of insulin ([place]) 12/17/2019   History of pulmonary embolism 12/17/2019   Essential hypertension 12/17/2019   Past Medical History:  Diagnosis Date   Arthritis    Chronic kidney disease    reduced kidney function. Stage III  Diabetes mellitus without complication ([place])    DVT (deep venous thrombosis) [facility])    age indeterminate LLE DVT 02/09/20   Dyspnea    On exertion   History of kidney stones    History of pulmonary embolus (PE)    Hypertension    Lipidemia    Prostate enlargement    Pulmonary embolism ([place]) 2009   Sleep apnea     Family History  Problem Relation Age of Onset   Cancer Mother    Heart disease Father     Past Surgical History:  Procedure Laterality Date   CYSTOSCOPY W/ URETERAL STENT  PLACEMENT Left 10/08/2020   Procedure: CYSTOSCOPY WITH RETROGRADE PYELOGRAM/URETERAL STENT PLACEMENT;  Surgeon: Irine Seal, MD;  Location: [facility];  Service: Urology;  Laterality: Left;   CYSTOSCOPY/URETEROSCOPY/HOLMIUM LASER/STENT PLACEMENT Right 06/07/2020   Procedure: CYSTOSCOPY/URETEROSCOPY/HOLMIUM LASER/STENT PLACEMENT;  Surgeon: Lucas Mallow, MD;  Location: [facility];  Service: Urology;  Laterality: Right;   CYSTOSCOPY/URETEROSCOPY/HOLMIUM LASER/STENT PLACEMENT Left 10/19/2020   Procedure: CYSTOSCOPY LEFT URETEROSCOPY/HOLMIUM LASER/STENT EXCHANGE;  Surgeon: Irine Seal, MD;  Location: [facility];  Service: Urology;  Laterality: Left;   TOTAL KNEE ARTHROPLASTY Left 08/06/2021   Procedure: LEFT TOTAL KNEE ARTHROPLASTY;  Surgeon: Leandrew Koyanagi, MD;  Location: [facility];  Service: Orthopedics;  Laterality: Left;   Social History   Occupational History   Occupation: retired  Tobacco Use   Smoking status: Never   Smokeless tobacco: Never  Vaping Use   Vaping Use: Never used  Substance and Sexual Activity   Alcohol use: Yes    Alcohol/week: 1.0 standard drink    Types: 1 Cans of beer per week    Comment: social   Drug use: Never   Sexual activity: Yes

## 2021-08-13 ENCOUNTER — Ambulatory Visit (INDEPENDENT_AMBULATORY_CARE_PROVIDER_SITE_OTHER): Payer: Medicare Other

## 2021-08-13 ENCOUNTER — Ambulatory Visit: Payer: Medicare Other

## 2021-08-13 ENCOUNTER — Telehealth: Payer: Self-pay | Admitting: Family Medicine

## 2021-08-13 DIAGNOSIS — Z7901 Long term (current) use of anticoagulants: Secondary | ICD-10-CM | POA: Diagnosis not present

## 2021-08-13 LAB — POCT INR: INR: 1.6 — AB (ref 2.0–3.0)

## 2021-08-13 NOTE — Telephone Encounter (Addendum)
Contacted Beth with [facility] and she reports today was the first time visiting the pt. She is not sure how long he held coumadin for knee surgery. She said would need to f/u with pt for further details. Beth reports she will be there 3 times this week and go back next week.  Advised to recheck in one wk and if anything else is needed to contact the office. Beth verbalized understanding.  Contacted pt with dosing instructions. Pt verbalized understanding.  Documented in an anticoagulation encounter

## 2021-08-13 NOTE — Patient Instructions (Addendum)
Pre visit review using our clinic review tool, if applicable. No additional management support is needed unless otherwise documented below in the visit note.  Increase dose today to take 10mg  mg and increase dose tomorrow to take 7.5mg  and then continue to take 5 mg daily except take 7.5 mg on Thurs and Sat. Recheck in 1 wks. Pt refused AVS being mailed and said he would look at it on my chart. Advised if any changes to contact office.

## 2021-08-13 NOTE — Telephone Encounter (Signed)
Beth called to give ptinr which is 1.6,19.3       Please Advise

## 2021-08-20 ENCOUNTER — Ambulatory Visit: Payer: Medicare Other

## 2021-08-20 ENCOUNTER — Ambulatory Visit (INDEPENDENT_AMBULATORY_CARE_PROVIDER_SITE_OTHER): Payer: Medicare Other

## 2021-08-20 DIAGNOSIS — Z7901 Long term (current) use of anticoagulants: Secondary | ICD-10-CM

## 2021-08-20 LAB — POCT INR: INR: 2.8 (ref 2.0–3.0)

## 2021-08-20 NOTE — Patient Instructions (Addendum)
Pre visit review using our clinic review tool, if applicable. No additional management support is needed unless otherwise documented below in the visit note.  Continue to take 5 mg daily except take 7.5 mg on Thurs and Sat. Recheck in 1 wks.

## 2021-08-21 ENCOUNTER — Ambulatory Visit (INDEPENDENT_AMBULATORY_CARE_PROVIDER_SITE_OTHER): Payer: Medicare Other | Admitting: Physician Assistant

## 2021-08-21 ENCOUNTER — Encounter: Payer: Self-pay | Admitting: Orthopaedic Surgery

## 2021-08-21 ENCOUNTER — Other Ambulatory Visit: Payer: Self-pay

## 2021-08-21 DIAGNOSIS — Z96652 Presence of left artificial knee joint: Secondary | ICD-10-CM

## 2021-08-21 MED ORDER — CEPHALEXIN 500 MG PO CAPS: 500.0000 mg | ORAL_CAPSULE | Freq: Four times a day (QID) | ORAL | 0 refills | Status: DC

## 2021-08-21 NOTE — Progress Notes (Signed)
Post-Op Visit Note   Patient: [name]           Date of Birth: 09-28-1956           MRN: [id number] Visit Date: 08/21/2021 PCP: [name], MD   Assessment & Plan:  Chief Complaint:  Chief Complaint  Patient presents with   Left Knee - Pain   Visit Diagnoses:  1. Hx of total knee replacement, left     Plan: Patient is a pleasant 65 year old gentleman who comes in today 2 weeks status post left total knee replacement 08/06/2021.  He has been doing well.  He has been taking occasional narcotic pain medicine with relief of symptoms.  He has been getting home health physical therapy.  He currently ambulating with a cane.  Of note, he is on chronic Coumadin and aspirin for previous PE.  He has been compliant taking Keflex.  Examination of the left knee reveals a well-healing surgical incision with nylon sutures in place.  There is 1 area that appears to be from a suture needle that is draining serosanguineous fluid.  He has minimal periincisional erythema.  No drainage from the actual incision.  He does have a large effusion with mild swelling the left lower extremity.  Calf soft nontender.  He is neurovascular intact distally.  At this point, sutures were removed and Steri-Strips applied.  We will continue his Keflex for another 10 days.  Should he develop any worsening symptoms he will call and let us know.  Otherwise, follow-up with [place] in 4 weeks time for repeat evaluation and 2 view x-rays of the left knee.  Call with concerns or questions.  Follow-Up Instructions: Return in about 4 weeks (around 09/18/2021).   Orders:  No orders of the defined types were placed in this encounter.  Meds ordered this encounter  Medications   cephALEXin (KEFLEX) 500 MG capsule    Sig: Take 1 capsule (500 mg total) by mouth 4 (four) times daily.    Dispense:  40 capsule    Refill:  0    Imaging: No new imaging  PMFS History: Patient Active Problem List   Diagnosis Date Noted    Status post total left knee replacement 08/06/2021   Encounter for medication monitoring 07/10/2021   UTI (urinary tract infection) 10/09/2020   Acute UTI 10/08/2020   Ureterolithiasis    Gross hematuria 06/16/2020   Renal lithiasis 06/07/2020   Renal stone 06/06/2020   Type 2 diabetes mellitus with stage 3b chronic kidney disease, with long-term current use of insulin ([place]) 05/26/2020   Visual disturbance 03/30/2020   Chronic pain of left knee 03/30/2020   Snores 03/30/2020   Chronic kidney disease, stage 3b ([place]) 03/30/2020   Midline low back pain without sciatica 03/30/2020   Hospital discharge follow-up 02/25/2020   Type 2 diabetes mellitus with diabetic polyneuropathy, with long-term current use of insulin ([place]) 02/23/2020   Syncope 02/08/2020   Renal stones 01/30/2020   Diminished pulses in lower extremity 01/26/2020   AKI (acute kidney injury) ([place]) 01/26/2020   CKD (chronic kidney disease) stage 4, GFR 15-29 ml/min (HCC) 01/05/2020   Hyperkalemia 01/05/2020   Long term (current) use of anticoagulants 12/22/2019   Elevated cholesterol 12/17/2019   Primary osteoarthritis of left knee 12/17/2019   Arthritis 12/17/2019   Type 2 diabetes mellitus with hyperglycemia, with long-term current use of insulin ([place]) 12/17/2019   History of pulmonary embolism 12/17/2019   Essential hypertension 12/17/2019   Past Medical History:  Diagnosis Date   Arthritis    Chronic kidney disease    reduced kidney function. Stage III   Diabetes mellitus without complication (HCC)    DVT (deep venous thrombosis) [facility])    age indeterminate LLE DVT 02/09/20   Dyspnea    On exertion   History of kidney stones    History of pulmonary embolus (PE)    Hypertension    Lipidemia    Prostate enlargement    Pulmonary embolism ([place]) 2009   Sleep apnea     Family History  Problem Relation Age of Onset   Cancer Mother    Heart disease Father     Past Surgical History:  Procedure Laterality Date    CYSTOSCOPY W/ URETERAL STENT PLACEMENT Left 10/08/2020   Procedure: CYSTOSCOPY WITH RETROGRADE PYELOGRAM/URETERAL STENT PLACEMENT;  Surgeon: Irine Seal, MD;  Location: [facility];  Service: Urology;  Laterality: Left;   CYSTOSCOPY/URETEROSCOPY/HOLMIUM LASER/STENT PLACEMENT Right 06/07/2020   Procedure: CYSTOSCOPY/URETEROSCOPY/HOLMIUM LASER/STENT PLACEMENT;  Surgeon: Lucas Mallow, MD;  [facility];  Service: Urology;  Laterality: Right;   CYSTOSCOPY/URETEROSCOPY/HOLMIUM LASER/STENT PLACEMENT Left 10/19/2020   Procedure: CYSTOSCOPY LEFT URETEROSCOPY/HOLMIUM LASER/STENT EXCHANGE;  Surgeon: Irine Seal, MD;  [facility];  Service: Urology;  Laterality: Left;   TOTAL KNEE ARTHROPLASTY Left 08/06/2021   Procedure: LEFT TOTAL KNEE ARTHROPLASTY;  Surgeon: Leandrew Koyanagi, MD;  Location: [facility];  Service: Orthopedics;  Laterality: Left;   Social History   Occupational History   Occupation: retired  Tobacco Use   Smoking status: Never   Smokeless tobacco: Never  Vaping Use   Vaping Use: Never used  Substance and Sexual Activity   Alcohol use: Yes    Alcohol/week: 1.0 standard drink    Types: 1 Cans of beer per week    Comment: social   Drug use: Never   Sexual activity: Yes

## 2021-08-27 ENCOUNTER — Other Ambulatory Visit: Payer: Self-pay | Admitting: Family Medicine

## 2021-08-27 ENCOUNTER — Telehealth: Payer: Self-pay | Admitting: Orthopaedic Surgery

## 2021-08-27 DIAGNOSIS — N1832 Chronic kidney disease, stage 3b: Secondary | ICD-10-CM

## 2021-08-27 DIAGNOSIS — I10 Essential (primary) hypertension: Secondary | ICD-10-CM

## 2021-08-27 NOTE — Telephone Encounter (Signed)
Patient called advised he is finishing (PT) and would like to be set up for out patient (PT) The number to contact patient is [phone]

## 2021-08-29 ENCOUNTER — Encounter: Payer: Self-pay | Admitting: Internal Medicine

## 2021-08-29 ENCOUNTER — Other Ambulatory Visit: Payer: Self-pay

## 2021-08-29 ENCOUNTER — Ambulatory Visit (INDEPENDENT_AMBULATORY_CARE_PROVIDER_SITE_OTHER): Payer: Medicare Other | Admitting: Internal Medicine

## 2021-08-29 VITALS — BP 130/84 | HR 95 | Ht 73.0 in | Wt 229.0 lb

## 2021-08-29 DIAGNOSIS — Z794 Long term (current) use of insulin: Secondary | ICD-10-CM | POA: Diagnosis not present

## 2021-08-29 DIAGNOSIS — E1122 Type 2 diabetes mellitus with diabetic chronic kidney disease: Secondary | ICD-10-CM

## 2021-08-29 DIAGNOSIS — N1832 Chronic kidney disease, stage 3b: Secondary | ICD-10-CM | POA: Diagnosis not present

## 2021-08-29 DIAGNOSIS — E1142 Type 2 diabetes mellitus with diabetic polyneuropathy: Secondary | ICD-10-CM

## 2021-08-29 LAB — POCT GLYCOSYLATED HEMOGLOBIN (HGB A1C): Hemoglobin A1C: 6 % — AB (ref 4.0–5.6)

## 2021-08-29 NOTE — Progress Notes (Signed)
Name: Catlin Aycock  Age/ Sex: 65 y.o., male   MRN/ DOB: [id number], 17-Oct-1956     PCP: [name], MD   Reason for Endocrinology Evaluation: Type 2 Diabetes Mellitus  Initial Endocrine Consultative Visit: 02/23/2020    PATIENT IDENTIFIER: Mr. Vue Pavon is a 65 y.o. male with a past medical history of HTN, T2DM and Hx of PE. The patient has followed with Endocrinology clinic since 02/23/2020 for consultative assistance with management of his diabetes.  DIABETIC HISTORY:  Mr. Blanchard was diagnosed with T2DM in 2009, metformin- stopped 12/2019 due to AKI as well as Glyburide. Has been on insulin mix . His hemoglobin A1c has ranged from 7.6% ,peaking at 8.6% in 2021   On her initial visit to  [facility] her A1c was 8.6% , she was on Humulin MIx which we adjusted    Moved from [place]  ~ early 2021   GLP-1 agonists started 04/2021  SUBJECTIVE:   During the last visit (04/23/2021): A1c 7.9 %. Adjusted Humulin Mix started trulicity    Today (75/08/2584): Mr. Brandl is here for a follow up on diabetes management.  He checks his blood sugars 3 times daily. The patient has had hypoglycemic episodes since the last clinic visit.  Denies nausea , vomiting or diarrhea.   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- recovering   HOME DIABETES REGIMEN:  Humulin Mix 30-40  units with Breakfast and 40-50 units with Supper Trulicity 1.5 mg weekly    Statin: yes ACE-I/ARB: Intolerant to Lisinopril to AKI    METER DOWNLOAD SUMMARY: 10/12-10/26/2022 Average Number Tests/Day = 2.6 Overall Mean FS Glucose = 99 Standard Deviation = 23  BG Ranges: Low = 57 High = 148   Hypoglycemic Events/30 Days: BG < 50 = 0 Episodes of symptomatic severe hypoglycemia =0    DIABETIC COMPLICATIONS: Microvascular complications:  CKD, neuropathy Denies: retinopathy Last eye exam: Completed 05/16/2021   Macrovascular complications:   Denies: CAD, PVD, CVA      HISTORY:  Past Medical History:   Past Medical History:  Diagnosis Date   Arthritis    Chronic kidney disease    reduced kidney function. Stage III   Diabetes mellitus without complication (HCC)    DVT (deep venous thrombosis) [facility])    age indeterminate LLE DVT 02/09/20   Dyspnea    On exertion   History of kidney stones    History of pulmonary embolus (PE)    Hypertension    Lipidemia    Prostate enlargement    Pulmonary embolism ([place]) 2009   Sleep apnea    Past Surgical History:  Past Surgical History:  Procedure Laterality Date   CYSTOSCOPY W/ URETERAL STENT PLACEMENT Left 10/08/2020   Procedure: CYSTOSCOPY WITH RETROGRADE PYELOGRAM/URETERAL STENT PLACEMENT;  Surgeon: Irine Seal, MD;  Location: [facility];  Service: Urology;  Laterality: Left;   CYSTOSCOPY/URETEROSCOPY/HOLMIUM LASER/STENT PLACEMENT Right 06/07/2020   Procedure: CYSTOSCOPY/URETEROSCOPY/HOLMIUM LASER/STENT PLACEMENT;  Surgeon: Lucas Mallow, MD;  [facility];  Service: Urology;  Laterality: Right;   CYSTOSCOPY/URETEROSCOPY/HOLMIUM LASER/STENT PLACEMENT Left 10/19/2020   Procedure: CYSTOSCOPY LEFT URETEROSCOPY/HOLMIUM LASER/STENT EXCHANGE;  Surgeon: Irine Seal, MD;  [facility];  Service: Urology;  Laterality: Left;   TOTAL KNEE ARTHROPLASTY Left 08/06/2021   Procedure: LEFT TOTAL KNEE ARTHROPLASTY;  Surgeon: Leandrew Koyanagi, MD;  Location: [facility];  Service: Orthopedics;  Laterality: Left;   Social History:  reports that he has never smoked. He has never used smokeless tobacco. He reports current alcohol use of  about 1.0 standard drink per week. He reports that he does not use drugs. Family History:  Family History  Problem Relation Age of Onset   Cancer Mother    Heart disease Father      HOME MEDICATIONS: Allergies as of 08/29/2021   No Known Allergies      Medication List        Accurate as of August 29, 2021  3:12 PM. If you have any questions, ask your nurse or doctor.          Accu-Chek Aviva device by Other route.  Use as instructed   Accu-Chek Guide test strip Generic drug: glucose blood USE TO CHECK BLOOD SUGAR 3 TIMES DAILY.E11.65   Accu-Chek Softclix Lancets lancets Use as instructed to check blood sugar 3 times daily Dx is E11.65   amLODipine 10 MG tablet Commonly known as: NORVASC Take 1 tablet (10 mg total) by mouth daily.   aspirin EC 81 MG tablet Take 81 mg by mouth daily.   BD Insulin Syringe U/F 31G X 5/16" 1 ML Misc Generic drug: Insulin Syringe-Needle U-100 USE AS DIRECTED   cephALEXin 500 MG capsule Commonly known as: Keflex Take 1 capsule (500 mg total) by mouth 4 (four) times daily.   diclofenac Sodium 1 % Gel Commonly known as: VOLTAREN Apply 1 application topically 4 (four) times daily as needed (pain).   docusate sodium 100 MG capsule Commonly known as: Colace Take 1 capsule (100 mg total) by mouth daily as needed.   gabapentin 300 MG capsule Commonly known as: NEURONTIN TAKE 1 CAPSULE BY MOUTH FOUR TIMES A DAY   HumuLIN 70/30 (70-30) 100 UNIT/ML injection Generic drug: insulin NPH-regular Human Inject 50 Units into the skin daily before breakfast AND 52 Units daily before supper.   Insulin Syringes (Disposable) U-100 1 ML Misc 1 Device by Does not apply route as directed.   losartan 50 MG tablet Commonly known as: COZAAR TAKE 1 TABLET BY MOUTH EVERY DAY   lovastatin 40 MG tablet Commonly known as: MEVACOR TAKE 1 TABLET (40 MG TOTAL) BY MOUTH AT BEDTIME. **NEEDS APPT BEFORE NEXT REFILL**   methocarbamol 500 MG tablet Commonly known as: Robaxin Take 1 tablet (500 mg total) by mouth 2 (two) times daily as needed. To be taken after surgery   montelukast 10 MG tablet Commonly known as: SINGULAIR Take 1 tablet (10 mg total) by mouth at bedtime.   ondansetron 4 MG tablet Commonly known as: Zofran Take 1 tablet (4 mg total) by mouth every 8 (eight) hours as needed for nausea or vomiting.   oxyCODONE-acetaminophen 5-325 MG tablet Commonly known as:  Percocet Take 1-2 tablets by mouth every 6 (six) hours as needed. To be taken after surgery   Trulicity 1.5 WG/6.6ZL Sopn Generic drug: Dulaglutide Inject 1.5 mg into the skin once a week. What changed: when to take this   warfarin 5 MG tablet Commonly known as: COUMADIN Take as directed by the anticoagulation clinic. If you are unsure how to take this medication, talk to your nurse or doctor. Original instructions: Take 1 tablet (5 mg total) by mouth daily. Take as directed. What changed:  how much to take when to take this additional instructions         OBJECTIVE:   Vital Signs: BP 130/84 (BP Location: Right Arm, Patient Position: Sitting, Cuff Size: Small)   Pulse 95   Ht 6\' 1"  (1.854 m)   Wt 229 lb (103.9 kg)   SpO2 98%  BMI 30.21 kg/m   Wt Readings from Last 3 Encounters:  08/29/21 229 lb (103.9 kg)  08/06/21 235 lb (106.6 kg)  08/02/21 235 lb 8 oz (106.8 kg)     Exam: General: Pt appears well and is in NAD  Lungs: Clear with good BS bilat with no rales, rhonchi, or wheezes  Heart: RRR   Extremities: 1+  pretibial edema.   Neuro: MS is good with appropriate affect, pt is alert and Ox3     DM foot exam: 02/23/2020 - unable to perform 08/30/2021 due to thigh high compression stockings   The skin of the feet is without sores or ulcerations. Toe nails thickened The pedal pulses are 2+ on right and 2+ on left. The sensation is decreased to a screening 5.07, 10 gram monofilament bilaterally     DATA REVIEWED:  Lab Results  Component Value Date   HGBA1C 5.9 06/19/2021   HGBA1C 7.9 (H) 03/30/2021   HGBA1C 7.0 (A) 01/26/2021   Lab Results  Component Value Date   MICROALBUR 4.9 (H) 08/28/2020   CREATININE 1.77 (H) 08/07/2021   Lab Results  Component Value Date   MICRALBCREAT 4.4 08/28/2020     Lab Results  Component Value Date   CHOL 156 12/22/2019   HDL 41.40 12/22/2019   LDLDIRECT 78.0 08/28/2020   TRIG 251.0 (H) 12/22/2019   CHOLHDL 4  12/22/2019         ASSESSMENT / PLAN / RECOMMENDATIONS:   1) Type 2 Diabetes Mellitus, With Neuropathic and CKD III complications - Most recent A1c of 6.0 %. Goal A1c < 7.0 %.    -His A1c is low due to recurrent hypoglycemia episodes, pt has been intentionally keeping his BG's tight due to knee surgery, we discussed fatal consequences of severe hypoglycemic episodes.  - Will reduce his insulin as below, he was advised not to adjust his insulin up for more then 5 points.    MEDICATIONS:  -Trulicity 1.5 mg weekly  - Decrease  Humulin Mix  35 units Before breakfast and before supper    EDUCATION / INSTRUCTIONS: BG monitoring instructions: Patient is instructed to check his blood sugars 2 times a day, before breakfast and supper. Call [facility] if: BG persistently < 70 I reviewed the Rule of 15 for the treatment of hypoglycemia in detail with the patient. Literature supplied.   2) Diabetic complications:  Eye: Does not have known diabetic retinopathy.  Neuro/ Feet: Does  have known diabetic peripheral neuropathy .  Renal: Patient does  have known baseline CKD. He  Is on ARB.      F/U in 6 months     Signed electronically by: Mack Guise, MD  [facility] Endocrin[facility]  [facility] [address] [place], [place] [address] Phone: [phone] FAX: [phone]   CC: [name], MD [place] [address] Phone: [phone]  Fax: [phone]  Return to Endocrinology clinic as below: Future Appointments  Date Time Provider [place]  09/18/2021  1:30 PM Leandrew Koyanagi, MD [facility] None  06/06/2022  2:00 PM Lomax, Amy, NP [facility] None

## 2021-08-29 NOTE — Patient Instructions (Signed)
-   Continue Trulicity 1.5 mg weekly  - Decrease Humulin Mix 35 units Before breakfast and 35  units before supper     HOW TO TREAT LOW BLOOD SUGARS (Blood sugar LESS THAN 70 MG/DL) Please follow the RULE OF 15 for the treatment of hypoglycemia treatment (when your (blood sugars are less than 70 mg/dL)   STEP 1: Take 15 grams of carbohydrates when your blood sugar is low, which includes:  3-4 GLUCOSE TABS  OR 3-4 OZ OF JUICE OR REGULAR SODA OR ONE TUBE OF GLUCOSE GEL    STEP 2: RECHECK blood sugar in 15 MINUTES STEP 3: If your blood sugar is still low at the 15 minute recheck --> then, go back to STEP 1 and treat AGAIN with another 15 grams of carbohydrates.

## 2021-08-30 ENCOUNTER — Other Ambulatory Visit: Payer: Self-pay

## 2021-08-30 DIAGNOSIS — Z96652 Presence of left artificial knee joint: Secondary | ICD-10-CM

## 2021-08-30 MED ORDER — INSULIN PEN NEEDLE 32G X 4 MM MISC: 1.0000 | Freq: Two times a day (BID) | 3 refills | Status: DC

## 2021-08-30 MED ORDER — HUMULIN 70/30 (70-30) 100 UNIT/ML ~~LOC~~ SUSP: 35.0000 [IU] | Freq: Two times a day (BID) | SUBCUTANEOUS | 3 refills | Status: DC

## 2021-08-30 MED ORDER — TRULICITY 1.5 MG/0.5ML ~~LOC~~ SOAJ: 1.5000 mg | SUBCUTANEOUS | 3 refills | Status: DC

## 2021-08-30 NOTE — Telephone Encounter (Signed)
Order made

## 2021-08-31 ENCOUNTER — Other Ambulatory Visit: Payer: Self-pay | Admitting: Family Medicine

## 2021-09-03 NOTE — Progress Notes (Signed)
Please follow up with coumadin clinic.

## 2021-09-04 ENCOUNTER — Ambulatory Visit: Payer: Medicare Other | Admitting: Physical Therapy

## 2021-09-05 ENCOUNTER — Telehealth: Payer: Self-pay

## 2021-09-05 NOTE — Telephone Encounter (Signed)
Pt is due for INR check. At last conversation, pt reported he does not think [facility] will be coming to his home any longer but he would check and if they were not he would call for an apt. Last check was 2.8 and in range and was on 10/17.  LVM for pt to call to schedule coumadin clinic apt.

## 2021-09-11 ENCOUNTER — Other Ambulatory Visit: Payer: Self-pay

## 2021-09-11 ENCOUNTER — Ambulatory Visit: Payer: Medicare Other | Attending: Orthopaedic Surgery | Admitting: Rehabilitative and Restorative Service Providers"

## 2021-09-11 ENCOUNTER — Encounter: Payer: Self-pay | Admitting: Rehabilitative and Restorative Service Providers"

## 2021-09-11 DIAGNOSIS — M6281 Muscle weakness (generalized): Secondary | ICD-10-CM | POA: Insufficient documentation

## 2021-09-11 DIAGNOSIS — R2689 Other abnormalities of gait and mobility: Secondary | ICD-10-CM | POA: Diagnosis present

## 2021-09-11 DIAGNOSIS — Z96652 Presence of left artificial knee joint: Secondary | ICD-10-CM | POA: Insufficient documentation

## 2021-09-11 NOTE — Patient Instructions (Addendum)
Discussed placing pillow between knees when sidelying. Discussed usage of icing 20 min after increased activity, with soreness, with increased heat/edema as needed. Discussed usage of thigh compression hose vs knee compression sock. Advised for now to continue to do HHPT exercises.

## 2021-09-11 NOTE — Therapy (Signed)
[place], [place], [address] Phone: [phone]   Fax:  [phone]  Physical Therapy Evaluation  Patient Details  Name: [name] MRN: [id number] Date of Birth: Jul 08, 1956 Referring Provider (PT): Dr. Erlinda Hong   Encounter Date: 09/11/2021   PT End of Session - 09/11/21 0841     Visit Number 1    Number of Visits 12    Date for PT Re-Evaluation 10/23/21    Authorization Type MCR/MCD    Progress Note Due on Visit 10    PT Start Time 0803    PT Stop Time 0846    PT Time Calculation (min) 43 min    Activity Tolerance Patient tolerated treatment well;No increased pain    Behavior During Therapy WFL for tasks assessed/performed             Past Medical History:  Diagnosis Date   Arthritis    Chronic kidney disease    reduced kidney function. Stage III   Diabetes mellitus without complication (HCC)    DVT (deep venous thrombosis) [facility])    age indeterminate LLE DVT 02/09/20   Dyspnea    On exertion   History of kidney stones    History of pulmonary embolus (PE)    Hypertension    Lipidemia    Prostate enlargement    Pulmonary embolism ([place]) 2009   Sleep apnea     Past Surgical History:  Procedure Laterality Date   CYSTOSCOPY W/ URETERAL STENT PLACEMENT Left 10/08/2020   Procedure: CYSTOSCOPY WITH RETROGRADE PYELOGRAM/URETERAL STENT PLACEMENT;  Surgeon: Irine Seal, MD;  Location: [facility];  Service: Urology;  Laterality: Left;   CYSTOSCOPY/URETEROSCOPY/HOLMIUM LASER/STENT PLACEMENT Right 06/07/2020   Procedure: CYSTOSCOPY/URETEROSCOPY/HOLMIUM LASER/STENT PLACEMENT;  Surgeon: Lucas Mallow, MD;  [facility];  Service: Urology;  Laterality: Right;   CYSTOSCOPY/URETEROSCOPY/HOLMIUM LASER/STENT PLACEMENT Left 10/19/2020   Procedure: CYSTOSCOPY LEFT URETEROSCOPY/HOLMIUM LASER/STENT EXCHANGE;  Surgeon: Irine Seal, MD;  [facility];  Service: Urology;  Laterality: Left;   TOTAL KNEE  ARTHROPLASTY Left 08/06/2021   Procedure: LEFT TOTAL KNEE ARTHROPLASTY;  Surgeon: Leandrew Koyanagi, MD;  Location: [facility];  Service: Orthopedics;  Laterality: Left;    There were no vitals filed for this visit.    Subjective Assessment - 09/11/21 0810     Subjective My body is getting unwound from the position it was in for the last two years. L leg is weak so can't do reciprocal. Pt used CPM post surgery    Pertinent History L TKR 08/06/21    Limitations Standing;House hold activities    How long can you sit comfortably? 20 min    How long can you stand comfortably? half hour to an hour    How long can you walk comfortably? 30 min    Patient Stated Goals I would like to get back to normal gait; ideally not use the cane.    Currently in Pain? No/denies    Pain Score 0-No pain    Pain Location Knee    Pain Orientation Left    Pain Descriptors / Indicators Aching    Pain Type Surgical pain    Pain Radiating Towards lateral knee and thigh    Pain Onset More than a month ago    Pain Frequency Intermittent    Aggravating Factors  none really; increased activity increases soreness    Pain Relieving Factors icing    Effect of Pain on Daily Activities pt can perform activities with  modifications as needed    Multiple Pain Sites No                [facility] PT Assessment - 09/11/21 0001       Assessment   Medical Diagnosis L TKR    Referring Provider (PT) Dr. Erlinda Hong    Onset Date/Surgical Date 08/06/21    Hand Dominance Left    Next MD Visit Nov 2022    Prior Therapy HHPT x 4 weeks      Precautions   Precautions None      Restrictions   Weight Bearing Restrictions No      Balance Screen   Has the patient fallen in the past 6 months No      [place] residence      Prior Function   Level of Independence Independent      Cognition   Overall Cognitive Status Within Functional Limits for tasks assessed      Observation/Other Assessments    Observations pt sits with slightly unequal WB with less pressure on L LE    Focus on Therapeutic Outcomes (FOTO)  44% function      Sensation   Light Touch Impaired by gross assessment   around knee     Coordination   Gross Motor Movements are Fluid and Coordinated Yes      ROM / Strength   AROM / PROM / Strength AROM;Strength      AROM   Overall AROM Comments R knee flex 124, L 100. R knee ext -3 degrees and L -19      Strength   Overall Strength Comments R hip flexion 3+, knee ext/flex 4+/5; L throughout 4/5 with hip flex/knee flex/ext with more disocmfort with knee flexion; bil hip ext 4+/5; R hip abdct 4/5 and L 3-/5, bil hip adductors 4/5      Palpation   Palpation comment tight anterolateral quad muscle band, limitation on scar massage superior and inferior scar; moderate swelling at knee with pt wearing calf compression hose but has thigh hose; slight extension lag with SLR      Ambulation/Gait   Gait Comments decreased WB L LE, minimal heel strike, minimal knee flexion at approp phase of gait                        Objective measurements completed on examination: See above findings.                  PT Short Term Goals - 09/11/21 0849       PT SHORT TERM GOAL #1   Title Pt will be indep with HEP to assist with improved functional mobility    Baseline not issued at eval    Time 3    Period Weeks    Status New    Target Date 10/02/21      PT SHORT TERM GOAL #2   Title Pt will demo improved L knee extension to lacking </= -10 degrees measured supine to assist with gait    Baseline -19 degrees    Time 3    Period Weeks    Status New    Target Date 10/02/21      PT SHORT TERM GOAL #3   Title pt will demo improved L knee flexion to >/=108 degrees to assist with normalized gait    Baseline 100 degrees    Time 3    Period Weeks  Status New    Target Date 10/02/21               PT Long Term Goals - 09/11/21 0843       PT  LONG TERM GOAL #1   Title Pt will be able to ambulate x 45 min without a cane with </=2/10 L knee pain    Baseline 30 min    Time 6    Period Weeks    Status New    Target Date 10/23/21      PT LONG TERM GOAL #2   Title Pt will be able to stand x 1.5 hours to perform household activities with </=3/10 L knee pain    Baseline half hour to an hour depending on the day    Time 6    Period Weeks    Status New    Target Date 10/23/21      PT LONG TERM GOAL #3   Title pt will be able to walk up/down stairs reciprocally at home with good control with pain </=3/10 L knee    Baseline performing step to    Time 6    Period Weeks    Status New    Target Date 10/23/21      PT LONG TERM GOAL #4   Title Pt will be able to demo improved gait with heel strike and knee flex/ext L at approp phases of gait x 75% of the time with least restrictive assistive device.    Baseline unable    Time 6    Period Weeks    Status New    Target Date 10/23/21      PT LONG TERM GOAL #5   Title pt will demo improved foto score to 58% function    Baseline 44%    Time 6    Period Weeks    Status New    Target Date 10/23/21                    Plan - 09/11/21 1043     Clinical Impression Statement Pt presents to PT s/p L TKR 08/06/21. He received HHPT and was recently discharged. He presents with abnormality of gait and limitation of L knee AROM as measured by goniometer along with functional strength deficit; however, he has functional AROM of his L knee. He demos scar adhesions superior and inferior scar. Pt would benefit from further PT for GT, manual therapy for ROM and scar mobility, and functional strengthening.    Personal Factors and Comorbidities Comorbidity 1;Comorbidity 2;Comorbidity 3+    Comorbidities Hx of PE, LBP, syncope hx, DM 2, dyspnea on exertion    Examination-Activity Limitations Squat;Stairs;Locomotion Level;Other   gait   Examination-Participation Restrictions  Cleaning;Shop;Community Activity    Stability/Clinical Decision Making Stable/Uncomplicated    Clinical Decision Making Low    Rehab Potential Good    PT Frequency 2x / week    PT Duration 6 weeks    PT Treatment/Interventions ADLs/Self Care Home Management;Cryotherapy;DME Instruction;Gait training;Stair training;Functional mobility training;Neuromuscular re-education;Balance training;Therapeutic exercise;Therapeutic activities;Patient/family education;Manual techniques;Passive range of motion;Dry needling;Taping    PT Next Visit Plan issue HEP; see how sleeping is going with a pillow in sidelying, gait training    Consulted and Agree with Plan of Care Patient             Patient will benefit from skilled therapeutic intervention in order to improve the following deficits and impairments:  Abnormal gait, Decreased endurance, Decreased mobility,  Difficulty walking, Hypomobility, Increased edema, Decreased range of motion, Decreased activity tolerance, Decreased strength, Impaired flexibility, Pain  Visit Diagnosis: Other abnormalities of gait and mobility  Muscle weakness (generalized)     Problem List Patient Active Problem List   Diagnosis Date Noted   Status post total left knee replacement 08/06/2021   Encounter for medication monitoring 07/10/2021   UTI (urinary tract infection) 10/09/2020   Acute UTI 10/08/2020   Ureterolithiasis    Gross hematuria 06/16/2020   Renal lithiasis 06/07/2020   Renal stone 06/06/2020   Type 2 diabetes mellitus with stage 3b chronic kidney disease, with long-term current use of insulin ([place]) 05/26/2020   Visual disturbance 03/30/2020   Chronic pain of left knee 03/30/2020   Snores 03/30/2020   Chronic kidney disease, stage 3b ([place]) 03/30/2020   Midline low back pain without sciatica 03/30/2020   Hospital discharge follow-up 02/25/2020   Type 2 diabetes mellitus with diabetic polyneuropathy, with long-term current use of insulin ([place])  02/23/2020   Syncope 02/08/2020   Renal stones 01/30/2020   Diminished pulses in lower extremity 01/26/2020   AKI (acute kidney injury) ([place]) 01/26/2020   CKD (chronic kidney disease) stage 4, GFR 15-29 ml/min (HCC) 01/05/2020   Hyperkalemia 01/05/2020   Long term (current) use of anticoagulants 12/22/2019   Elevated cholesterol 12/17/2019   Primary osteoarthritis of left knee 12/17/2019   Arthritis 12/17/2019   Type 2 diabetes mellitus with hyperglycemia, with long-term current use of insulin ([place]) 12/17/2019   History of pulmonary embolism 12/17/2019   Essential hypertension 12/17/2019    America Brown, PT 09/11/2021, 10:57 AM  [facility] [address] [place], [place], [address] Phone: [phone]   Fax:  [phone]  Name: [name] MRN: [id number] Date of Birth: Jun 19, 1956

## 2021-09-12 NOTE — Telephone Encounter (Signed)
Pt should be scheduled for the week of 11/28. LVM

## 2021-09-13 ENCOUNTER — Ambulatory Visit (INDEPENDENT_AMBULATORY_CARE_PROVIDER_SITE_OTHER): Payer: Medicare Other | Admitting: Internal Medicine

## 2021-09-13 ENCOUNTER — Encounter: Payer: Self-pay | Admitting: Internal Medicine

## 2021-09-13 ENCOUNTER — Other Ambulatory Visit: Payer: Self-pay

## 2021-09-13 VITALS — BP 122/68 | HR 77 | Ht 73.0 in | Wt 228.0 lb

## 2021-09-13 DIAGNOSIS — R55 Syncope and collapse: Secondary | ICD-10-CM

## 2021-09-13 DIAGNOSIS — E782 Mixed hyperlipidemia: Secondary | ICD-10-CM

## 2021-09-13 DIAGNOSIS — I10 Essential (primary) hypertension: Secondary | ICD-10-CM

## 2021-09-13 DIAGNOSIS — Z794 Long term (current) use of insulin: Secondary | ICD-10-CM

## 2021-09-13 DIAGNOSIS — Z7901 Long term (current) use of anticoagulants: Secondary | ICD-10-CM

## 2021-09-13 DIAGNOSIS — E1142 Type 2 diabetes mellitus with diabetic polyneuropathy: Secondary | ICD-10-CM

## 2021-09-13 DIAGNOSIS — N184 Chronic kidney disease, stage 4 (severe): Secondary | ICD-10-CM

## 2021-09-13 NOTE — Progress Notes (Signed)
Cardiology Office Note:    Date:  09/13/2021   ID:  [name], DOB [birth year], MRN [id number]  PCP:  [name], MD  Cardiologist:  Elouise Munroe, MD  Electrophysiologist:  None   Referring MD: [name]   Chief Complaint/Reason for Referral: Prior syncope  History of Present Illness:    [name] is a 65 y.o. male with a history of PE on Coumadin, diabetes on insulin, stage IV chronic kidney disease with baseline creatinine around 1.7, kidney stones with prior intervention. Initially met him in hospital for an episode of syncope which responded to IV fluids and stopping his lisinopril and chlorthalidone.  Presents today for follow-up.  5 weeks ago he underwent a L knee arthroplasty. He is recovering well but is having trouble sleeping. He was given Tramadol after the surgery for any pain during recovery.  This is helping.  He anticipates after making full recovery that he may not need to use tramadol anymore and previously we had identified that his antihypertensives in conjunction with taking tramadol were the source of the second syncopal episode.  He has not had any syncopal episodes since his last visit. He is staggering medicine rather than taking them all at once everyday. Currently, he only takes his amlodipine once or twice a week.  He hopes to resume daily dosing of amlodipine after stopping tramadol.  Recently, he reports waking up and walking but it felt like "walking through [place] syrup". These episodes occur intermittently and only a few times per year. He experienced this bilateral LE weakness a few years ago and had similar symptoms. He describes this weakness as a lactic acid sting.  He feels this symptom is quite infrequent but was curious to know the etiology.  We postulated that this may be related to dehydration or electrolyte imbalance.  He used to take a potassium supplement but has since stopped at the counsel of his primary  physician.  He wears compression socks and wore them today in clinic.  I encouraged him to continue to do so as this will be a helpful measure avoiding further syncopal episodes.  The patient denies chest pain, chest pressure, dyspnea at rest or with exertion, PND, orthopnea, or leg swelling. Denies cough, fever, chills, nausea, or vomiting. Denies recurrent syncope, presyncope. Denies dizziness or lightheadedness.   Follows with his primary medical doctor for INR.  Most recent INR 2.8 08/20/2021. Past Medical History:  Diagnosis Date   Arthritis    Chronic kidney disease    reduced kidney function. Stage III   Diabetes mellitus without complication (HCC)    DVT (deep venous thrombosis) [facility])    age indeterminate LLE DVT 02/09/20   Dyspnea    On exertion   History of kidney stones    History of pulmonary embolus (PE)    Hypertension    Lipidemia    Prostate enlargement    Pulmonary embolism ([place]) 2009   Sleep apnea     Past Surgical History:  Procedure Laterality Date   CYSTOSCOPY W/ URETERAL STENT PLACEMENT Left 10/08/2020   Procedure: CYSTOSCOPY WITH RETROGRADE PYELOGRAM/URETERAL STENT PLACEMENT;  Surgeon: Irine Seal, MD;  Location: [place];  Service: Urology;  Laterality: Left;   CYSTOSCOPY/URETEROSCOPY/HOLMIUM LASER/STENT PLACEMENT Right 06/07/2020   Procedure: CYSTOSCOPY/URETEROSCOPY/HOLMIUM LASER/STENT PLACEMENT;  Surgeon: Lucas Mallow, MD;  [facility];  Service: Urology;  Laterality: Right;   CYSTOSCOPY/URETEROSCOPY/HOLMIUM LASER/STENT PLACEMENT Left 10/19/2020   Procedure: CYSTOSCOPY LEFT URETEROSCOPY/HOLMIUM LASER/STENT EXCHANGE;  Surgeon: Jeffie Pollock,  Jenny Reichmann, MD;  Location[facility];  Service: Urology;  Laterality: Left;   TOTAL KNEE ARTHROPLASTY Left 08/06/2021   Procedure: LEFT TOTAL KNEE ARTHROPLASTY;  Surgeon: Leandrew Koyanagi, MD;  Location: [place];  Service: Orthopedics;  Laterality: Left;    Current Medications: Current Meds  Medication Sig   ACCU-CHEK GUIDE test  strip USE TO CHECK BLOOD SUGAR 3 TIMES DAILY.E11.65   Accu-Chek Softclix Lancets lancets Use as instructed to check blood sugar 3 times daily Dx is E11.65   amLODipine (NORVASC) 10 MG tablet Take 1 tablet (10 mg total) by mouth daily.   aspirin EC 81 MG tablet Take 81 mg by mouth daily.   BD INSULIN SYRINGE U/F 31G X 5/16" 1 ML MISC USE AS DIRECTED   Blood Glucose Monitoring Suppl (ACCU-CHEK AVIVA) device by Other route. Use as instructed   diclofenac Sodium (VOLTAREN) 1 % GEL Apply 1 application topically 4 (four) times daily as needed (pain).   Dulaglutide (TRULICITY) 1.5 YQ/6.5HQ SOPN Inject 1.5 mg into the skin once a week.   gabapentin (NEURONTIN) 300 MG capsule TAKE 1 CAPSULE BY MOUTH FOUR TIMES A DAY   insulin NPH-regular Human (HUMULIN 70/30) (70-30) 100 UNIT/ML injection Inject 35 Units into the skin 2 (two) times daily with a meal.   Insulin Pen Needle 32G X 4 MM MISC 1 Device by Does not apply route in the morning and at bedtime.   Insulin Syringes, Disposable, U-100 1 ML MISC 1 Device by Does not apply route as directed.   losartan (COZAAR) 50 MG tablet TAKE 1 TABLET BY MOUTH EVERY DAY   lovastatin (MEVACOR) 40 MG tablet TAKE 1 TABLET (40 MG TOTAL) BY MOUTH AT BEDTIME. **NEEDS APPT BEFORE NEXT REFILL**   montelukast (SINGULAIR) 10 MG tablet Take 1 tablet (10 mg total) by mouth at bedtime.   ondansetron (ZOFRAN) 4 MG tablet Take 1 tablet (4 mg total) by mouth every 8 (eight) hours as needed for nausea or vomiting.   warfarin (COUMADIN) 5 MG tablet Take 1 tablet (5 mg total) by mouth daily. Take as directed. (Patient taking differently: Take 5-7.5 mg by mouth See admin instructions. Take 1.5 tablets (7.5 mg) by mouth on Wednesdays, Thursdays and Saturdays. Take 1 tablet (5 mg) by mouth on Sundays, Mondays, Tuesdays, and Fridays.)     Allergies:   Patient has no known allergies.   Social History   Tobacco Use   Smoking status: Never   Smokeless tobacco: Never  Vaping Use   Vaping  Use: Never used  Substance Use Topics   Alcohol use: Yes    Alcohol/week: 1.0 standard drink    Types: 1 Cans of beer per week    Comment: social   Drug use: Never     Family History: The patient's family history includes Cancer in his mother; Heart disease in his father.  ROS:   Please see the history of present illness.   (+) Bilateral LE weakness  All other systems reviewed and are negative.  EKGs/Labs/Other Studies Reviewed:    The following studies were reviewed today: Lower Venous DVT 02/09/20 RIGHT:  - No evidence of common femoral vein obstruction.  LEFT:  - Findings consistent with age indeterminate deep vein thrombosis  involving the left femoral vein, left popliteal vein, and left peroneal  veins.  - A cystic structure is found in the popliteal fossa.   Carotid Arterial Duplex 02/09/20 Right Carotid: Velocities in the right ICA are consistent with a 1-39%  stenosis.  Left Carotid: Velocities in the left ICA are consistent with a 1-39%  stenosis.  Vertebrals: Bilateral vertebral arteries demonstrate antegrade flow.  Echo 02/09/20 1. Left ventricular ejection fraction, by estimation, is 60 to 65%. The  left ventricle has normal function. The left ventricle has no regional  wall motion abnormalities. There is mild left ventricular hypertrophy.  Left ventricular diastolic parameters  are consistent with Grade I diastolic dysfunction (impaired relaxation).   2. Right ventricular systolic function is normal. The right ventricular  size is normal.   3. Left atrial size was mildly dilated.   4. The mitral valve is grossly normal. Trivial mitral valve  regurgitation.   5. The aortic valve is tricuspid. Aortic valve regurgitation is not  visualized.   6. The inferior vena cava is normal in size with <50% respiratory  variability, suggesting right atrial pressure of 8 mmHg.   EKG:   09/13/21: Sinus rhythm, rate 77 bpm; PACs, low-voltage QRS 11/28/20: NSR, rate  91  Recent Labs: 08/02/2021: ALT 24 08/07/2021: BUN 22; Creatinine, Ser 1.77; Hemoglobin 13.4; Platelets 155; Potassium 5.6; Sodium 134  Recent Lipid Panel    Component Value Date/Time   CHOL 156 12/22/2019 0949   TRIG 251.0 (H) 12/22/2019 0949   HDL 41.40 12/22/2019 0949   CHOLHDL 4 12/22/2019 0949   VLDL 50.2 (H) 12/22/2019 0949   LDLDIRECT 78.0 08/28/2020 1413    Physical Exam:    VS:  BP 122/68   Pulse 77   Ht _0  (1.854 m)   Wt 228 lb (103.4 kg)   SpO2 99%   BMI 30.08 kg/m     Wt Readings from Last 5 Encounters:  09/13/21 228 lb (103.4 kg)  08/29/21 229 lb (103.9 kg)  08/06/21 235 lb (106.6 kg)  08/02/21 235 lb 8 oz (106.8 kg)  07/10/21 238 lb 3.2 oz (108 kg)    Constitutional: No acute distress Eyes: sclera non-icteric, normal conjunctiva and lids ENMT: normal dentition, moist mucous membranes Cardiovascular: regular rhythm, normal rate, no murmurs. S1 and S2 normal. Radial pulses normal bilaterally. No jugular venous distention.  Respiratory: clear to auscultation bilaterally GI : normal bowel sounds, soft and nontender. No distention.   MSK: extremities warm, well perfused. No edema.  NEURO: grossly nonfocal exam, moves all extremities. PSYCH: alert and oriented x 3, normal mood and affect.   ASSESSMENT:    1. Essential hypertension   2. Syncope, unspecified syncope type   3. Long term (current) use of anticoagulants   4. Mixed hyperlipidemia   5. Type 2 diabetes mellitus with diabetic polyneuropathy, with long-term current use of insulin ([place])   6. CKD (chronic kidney disease) stage 4, GFR 15-29 ml/min (HCC)     PLAN:    Essential hypertension - Plan: EKG 12-Lead - continue current regimen of amlodipine and losartan.  Blood pressure well controlled today.  Patient is only taking amlodipine once or twice a week but hopes to take it more frequently after he stops tramadol.  We have discussed in detail at our last visit staggering these medications and  this is what he is attempting to do.  I have offered to him to decrease the dose of amlodipine so that he can take it daily, but we participated in shared decision making and determined that we will wait until he completes full therapy with tramadol since blood pressure is well controlled today and see how his blood pressure fares.  Further recommendations at that point.  Syncope, unspecified syncope type -  as above, remain well hydrated and avoid medications in combination which may case hypotension.  No recurrent syncope since last visit.  Long term (current) use of anticoagulants - followed by PCP.  No bleeding. -In the setting of no known coronary artery disease and continued use of Coumadin for anticoagulation, there is no strong indication for continued aspirin therapy.  I have offered to the patient to stop this.  I will have him review this with his primary care physician if he would like.  Mixed hyperlipidemia - continue lovastatin 40 mg daily. No recent cholesterol labs available for my review, would recommend obtaining these at his next visit with his primary care physician.  Type 2 diabetes mellitus with diabetic polyneuropathy, with long-term current use of insulin (HCC) - per PCP, recent A1C 6.0%  Total time of encounter: 30 minutes total time of encounter, including 20 minutes spent in face-to-face patient care on the date of this encounter. This time includes coordination of care and counseling regarding above mentioned problem list. Remainder of non-face-to-face time involved reviewing chart documents/testing relevant to the patient encounter and documentation in the medical record. I have independently reviewed documentation from referring provider.   Cherlynn Kaiser, MD [place]  [facility]    Medication Adjustments/Labs and Tests Ordered: Current medicines are reviewed at length with the patient today.  Concerns regarding medicines are outlined above.   Orders  Placed This Encounter  Procedures   EKG 12-Lead     No orders of the defined types were placed in this encounter.   Patient Instructions  Medication Instructions:  No Changes In Medications at this time.  *If you need a refill on your cardiac medications before your next appointment, please call your pharmacy*  Follow-Up: At [facility], you and your health needs are our priority.  As part of our continuing mission to provide you with exceptional heart care, we have created designated Provider Care Teams.  These Care Teams include your primary Cardiologist (physician) and Advanced Practice Providers (APPs -  Physician Assistants and Nurse Practitioners) who all work together to provide you with the care you need, when you need it.  Your next appointment:   6 month(s)  The format for your next appointment:   In Person  Provider:   Elouise Munroe, MD {   Wilhemina Bonito as a scribe for Elouise Munroe, MD.,have documented all relevant documentation on the behalf of Elouise Munroe, MD,as directed by  Elouise Munroe, MD while in the presence of Elouise Munroe, MD.  I, Elouise Munroe, MD, have reviewed all documentation for this visit. The documentation on today's date of service for the exam, diagnosis, procedures, and orders are all accurate and complete.

## 2021-09-13 NOTE — Patient Instructions (Signed)
Medication Instructions:  ?No Changes In Medications at this time.  ?*If you need a refill on your cardiac medications before your next appointment, please call your pharmacy* ? ?Follow-Up: ?At [facility], you and your health needs are our priority.  As part of our continuing mission to provide you with exceptional heart care, we have created designated Provider Care Teams.  These Care Teams include your primary Cardiologist (physician) and Advanced Practice Providers (APPs -  Physician Assistants and Nurse Practitioners) who all work together to provide you with the care you need, when you need it. ? ?Your next appointment:   ?6 month(s) ? ?The format for your next appointment:   ?In Person ? ?Provider:   ?Gayatri A Acharya, MD   ? ?  ?

## 2021-09-18 ENCOUNTER — Ambulatory Visit (INDEPENDENT_AMBULATORY_CARE_PROVIDER_SITE_OTHER): Payer: Medicare Other | Admitting: Orthopaedic Surgery

## 2021-09-18 ENCOUNTER — Encounter: Payer: Self-pay | Admitting: Orthopaedic Surgery

## 2021-09-18 ENCOUNTER — Ambulatory Visit: Payer: Medicare Other | Admitting: Physical Therapy

## 2021-09-18 ENCOUNTER — Other Ambulatory Visit: Payer: Self-pay

## 2021-09-18 ENCOUNTER — Encounter: Payer: Self-pay | Admitting: Physical Therapy

## 2021-09-18 ENCOUNTER — Ambulatory Visit: Payer: Self-pay

## 2021-09-18 DIAGNOSIS — Z96652 Presence of left artificial knee joint: Secondary | ICD-10-CM

## 2021-09-18 DIAGNOSIS — R2689 Other abnormalities of gait and mobility: Secondary | ICD-10-CM

## 2021-09-18 DIAGNOSIS — M6281 Muscle weakness (generalized): Secondary | ICD-10-CM

## 2021-09-18 NOTE — Therapy (Signed)
[place], [place], [address] Phone: [phone]   Fax:  [phone]  Physical Therapy Treatment  Patient Details  Name: [name] MRN: [id number] Date of Birth: 02/21/1956 Referring Provider (PT): Dr. Erlinda Hong   Encounter Date: 09/18/2021   PT End of Session - 09/18/21 1059     Visit Number 2    Number of Visits 12    Date for PT Re-Evaluation 10/23/21    Authorization Type MCR/MCD    PT Start Time 1100    PT Stop Time 1146    PT Time Calculation (min) 46 min    Activity Tolerance Patient tolerated treatment well;No increased pain    Behavior During Therapy WFL for tasks assessed/performed             Past Medical History:  Diagnosis Date   Arthritis    Chronic kidney disease    reduced kidney function. Stage III   Diabetes mellitus without complication (HCC)    DVT (deep venous thrombosis) [facility])    age indeterminate LLE DVT 02/09/20   Dyspnea    On exertion   History of kidney stones    History of pulmonary embolus (PE)    Hypertension    Lipidemia    Prostate enlargement    Pulmonary embolism ([place]) 2009   Sleep apnea     Past Surgical History:  Procedure Laterality Date   CYSTOSCOPY W/ URETERAL STENT PLACEMENT Left 10/08/2020   Procedure: CYSTOSCOPY WITH RETROGRADE PYELOGRAM/URETERAL STENT PLACEMENT;  Surgeon: Irine Seal, MD;  Location: [place];  Service: Urology;  Laterality: Left;   CYSTOSCOPY/URETEROSCOPY/HOLMIUM LASER/STENT PLACEMENT Right 06/07/2020   Procedure: CYSTOSCOPY/URETEROSCOPY/HOLMIUM LASER/STENT PLACEMENT;  Surgeon: Lucas Mallow, MD;  [facility];  Service: Urology;  Laterality: Right;   CYSTOSCOPY/URETEROSCOPY/HOLMIUM LASER/STENT PLACEMENT Left 10/19/2020   Procedure: CYSTOSCOPY LEFT URETEROSCOPY/HOLMIUM LASER/STENT EXCHANGE;  Surgeon: Irine Seal, MD;  [facility];  Service: Urology;  Laterality: Left;   TOTAL KNEE ARTHROPLASTY Left 08/06/2021   Procedure:  LEFT TOTAL KNEE ARTHROPLASTY;  Surgeon: Leandrew Koyanagi, MD;  Location: [place];  Service: Orthopedics;  Laterality: Left;    There were no vitals filed for this visit.   Subjective Assessment - 09/18/21 1108     Subjective My knee hurts more when I go to bed at night.    Pertinent History L TKR 08/06/21    Limitations Standing;House hold activities    Patient Stated Goals I would like to get back to normal gait; ideally not use the cane.    Currently in Pain? Yes    Pain Score 1     Pain Location Knee    Pain Orientation Left    Pain Descriptors / Indicators Aching    Pain Type Surgical pain    Pain Onset More than a month ago    Pain Frequency Intermittent               [facility] Adult PT Treatment/Exercise:   Therapeutic Exercise:    Retro walking / stepping to stretch hamstring Wall slides with knee ext engagment   Seated Hamstring Stretch -3 reps - 30-60 sec hold on left Supine Knee Extension Stretch on Towel Roll - 3 sets - 10 reps on left Supine Knee Extension Strengthening 3 sets - 10 reps with ball Active Straight Leg Raise - 3 sets - 10 reps Prone Quadriceps Stretch with Strap - 1 sets - 3 reps - 30-60 sec hold on Left Standing Terminal Knee  Extension with Resistance - 3 sets - 10 reps - 3 sec hold on left with GTB Sit to Stand Without Arm Support - 1 x daily - 7 x weekly - 3 sets - 10 reps Sitting with knee flexion assisted with R to max stretch  Manual Therapy: Increase knee extension with PA mobs, Pt in prone with knee flexion with IR and ER mobs    Neuromuscular re-ed:     Therapeutic Activity:     Self Care:    ITEMS NOT PERFORMED TODAY:   [facility] PT Assessment - 09/18/21 0001       AROM   Right Knee Extension 3   decreased extension   Right Knee Flexion 124    Left Knee Extension 19   decreased extension   Left Knee Flexion 110                        Access Code: [email or web address] URL: [email or web address] Date:  09/18/2021 Prepared by: Voncille Lo  Exercises Seated Hamstring Stretch - 1 x daily - 7 x weekly - 1 sets - 3 reps - 30-60 sec hold Supine Knee Extension Stretch on Towel Roll - 1 x daily - 7 x weekly - 3 sets - 10 reps Supine Knee Extension Strengthening - 1 x daily - 7 x weekly - 3 sets - 10 reps Active Straight Leg Raise - 1 x daily - 7 x weekly - 3 sets - 10 reps Prone Quadriceps Stretch with Strap - 1 x daily - 7 x weekly - 1 sets - 3 reps - 30-60 sec hold Standing Terminal Knee Extension with Resistance - 1 x daily - 7 x weekly - 3 sets - 10 reps - 3 sec hold Sit to Stand Without Arm Support - 1 x daily - 7 x weekly - 3 sets - 10 reps            PT Education - 09/18/21 1148     Education Details given HEP updated from home health    Person(s) Educated Patient    Methods Explanation;Demonstration;Tactile cues;Verbal cues;Handout    Comprehension Verbalized understanding;Returned demonstration              PT Short Term Goals - 09/11/21 0849       PT SHORT TERM GOAL #1   Title Pt will be indep with HEP to assist with improved functional mobility    Baseline not issued at eval    Time 3    Period Weeks    Status New    Target Date 10/02/21      PT SHORT TERM GOAL #2   Title Pt will demo improved L knee extension to lacking </= -10 degrees measured supine to assist with gait    Baseline -19 degrees    Time 3    Period Weeks    Status New    Target Date 10/02/21      PT SHORT TERM GOAL #3   Title pt will demo improved L knee flexion to >/=108 degrees to assist with normalized gait    Baseline 100 degrees    Time 3    Period Weeks    Status New    Target Date 10/02/21               PT Long Term Goals - 09/11/21 0843       PT LONG TERM GOAL #1   Title Pt will be able to ambulate x 45  min without a cane with </=2/10 L knee pain    Baseline 30 min    Time 6    Period Weeks    Status New    Target Date 10/23/21      PT LONG TERM GOAL #2    Title Pt will be able to stand x 1.5 hours to perform household activities with </=3/10 L knee pain    Baseline half hour to an hour depending on the day    Time 6    Period Weeks    Status New    Target Date 10/23/21      PT LONG TERM GOAL #3   Title pt will be able to walk up/down stairs reciprocally at home with good control with pain </=3/10 L knee    Baseline performing step to    Time 6    Period Weeks    Status New    Target Date 10/23/21      PT LONG TERM GOAL #4   Title Pt will be able to demo improved gait with heel strike and knee flex/ext L at approp phases of gait x 75% of the time with least restrictive assistive device.    Baseline unable    Time 6    Period Weeks    Status New    Target Date 10/23/21      PT LONG TERM GOAL #5   Title pt will demo improved foto score to 58% function    Baseline 44%    Time 6    Period Weeks    Status New    Target Date 10/23/21                   Plan - 09/18/21 1244     Clinical Impression Statement Pt enters clinic with antalgic gait and decreased extension in  Left LE.  Pt worked on [employer] for home use to maximize AROM, flexion and extension. Pt with tight quadriceps and educated on scar tissue massage.  Pt returned demo of HEP given today.  Will continue maximizing gait and AROM in flex and ext.    Personal Factors and Comorbidities Comorbidity 1;Comorbidity 2;Comorbidity 3+    Comorbidities Hx of PE, LBP, syncope hx, DM 2, dyspnea on exertion    Examination-Activity Limitations Squat;Stairs;Locomotion Level;Other    Examination-Participation Restrictions Cleaning;Shop;Community Activity    PT Frequency 2x / week    PT Duration 6 weeks    PT Treatment/Interventions ADLs/Self Care Home Management;Cryotherapy;DME Instruction;Gait training;Stair training;Functional mobility training;Neuromuscular re-education;Balance training;Therapeutic exercise;Therapeutic activities;Patient/family education;Manual  techniques;Passive range of motion;Dry needling;Taping    PT Next Visit Plan issue HEP; see how sleeping is going with a pillow in sidelying, gait training review HEP and progress as needed. manual with knee ext/flex    PT Home Exercise Plan Access Code: [email or web address]             Patient will benefit from skilled therapeutic intervention in order to improve the following deficits and impairments:  Abnormal gait, Decreased endurance, Decreased mobility, Difficulty walking, Hypomobility, Increased edema, Decreased range of motion, Decreased activity tolerance, Decreased strength, Impaired flexibility, Pain  Visit Diagnosis: Other abnormalities of gait and mobility  Muscle weakness (generalized)     Problem List Patient Active Problem List   Diagnosis Date Noted   Status post total left knee replacement 08/06/2021   Encounter for medication monitoring 07/10/2021   UTI (urinary tract infection) 10/09/2020   Acute UTI 10/08/2020   Ureterolithiasis    Gross  hematuria 06/16/2020   Renal lithiasis 06/07/2020   Renal stone 06/06/2020   Type 2 diabetes mellitus with stage 3b chronic kidney disease, with long-term current use of insulin ([place]) 05/26/2020   Visual disturbance 03/30/2020   Chronic pain of left knee 03/30/2020   Snores 03/30/2020   Chronic kidney disease, stage 3b ([place]) 03/30/2020   Midline low back pain without sciatica 03/30/2020   Hospital discharge follow-up 02/25/2020   Type 2 diabetes mellitus with diabetic polyneuropathy, with long-term current use of insulin ([place]) 02/23/2020   Syncope 02/08/2020   Renal stones 01/30/2020   Diminished pulses in lower extremity 01/26/2020   AKI (acute kidney injury) ([place]) 01/26/2020   CKD (chronic kidney disease) stage 4, GFR 15-29 ml/min (HCC) 01/05/2020   Hyperkalemia 01/05/2020   Long term (current) use of anticoagulants 12/22/2019   Elevated cholesterol 12/17/2019   Primary osteoarthritis of left knee 12/17/2019   Arthritis  12/17/2019   Type 2 diabetes mellitus with hyperglycemia, with long-term current use of insulin ([place]) 12/17/2019   History of pulmonary embolism 12/17/2019   Essential hypertension 12/17/2019   Voncille Lo, PT, [place] Certified Exercise Expert for the Aging Adult  09/18/21 12:51 PM Phone: [phone] Fax: [facility] [address] [place], [place], [address] Phone: [phone]   Fax:  [phone]  Name: [name] MRN: [id number] Date of Birth: 1956/08/14

## 2021-09-18 NOTE — Patient Instructions (Addendum)
    Access Code: [email or web address] URL: [email or web address] Date: 09/18/2021 Prepared by: Voncille Lo  Exercises Seated Hamstring Stretch - 1 x daily - 7 x weekly - 1 sets - 3 reps - 30-60 sec hold Supine Knee Extension Stretch on Towel Roll - 1 x daily - 7 x weekly - 3 sets - 10 reps Supine Knee Extension Strengthening - 1 x daily - 7 x weekly - 3 sets - 10 reps Active Straight Leg Raise - 1 x daily - 7 x weekly - 3 sets - 10 reps Prone Quadriceps Stretch with Strap - 1 x daily - 7 x weekly - 1 sets - 3 reps - 30-60 sec hold Standing Terminal Knee Extension with Resistance - 1 x daily - 7 x weekly - 3 sets - 10 reps - 3 sec hold Sit to Stand Without Arm Support - 1 x daily - 7 x weekly - 3 sets - 10 reps   Voncille Lo, PT, [place] Certified Exercise Expert for the Aging Adult  09/18/21 11:48 AM Phone: [phone] Fax: [phone]

## 2021-09-18 NOTE — Progress Notes (Signed)
Post-Op Visit Note   Patient: [name]           Date of Birth: 19-Feb-1956           MRN: [id number] Visit Date: 09/18/2021 PCP: [name], MD   Assessment & Plan:  Chief Complaint:  Chief Complaint  Patient presents with   Left Knee - Post-op Follow-up   Visit Diagnoses:  1. Hx of total knee replacement, left   2. Status post left knee replacement     Plan: [name] is 6 weeks status post left total knee replacement.  Ambulating with a cane.  He has started outpatient PT recently.  Takes gabapentin and tramadol for the pain.  Left knee scar is healed.  Slight edema.  No signs of infection.  Range of motion 19- 110 degrees.  Stable to varus valgus.  X-rays are unremarkable.  [name] is recovering appropriately at this time.  He will continue with outpatient physical therapy twice a week at the [facility] location.  He is back on his baseline warfarin.  He will make significant efforts at improving knee extension.  We will see him back in 6 weeks for recheck.  Follow-Up Instructions: Return in about 6 weeks (around 10/30/2021).   Orders:  Orders Placed This Encounter  Procedures   XR Knee 1-2 Views Left   No orders of the defined types were placed in this encounter.   Imaging: No results found.  PMFS History: Patient Active Problem List   Diagnosis Date Noted   Status post total left knee replacement 08/06/2021   Encounter for medication monitoring 07/10/2021   UTI (urinary tract infection) 10/09/2020   Acute UTI 10/08/2020   Ureterolithiasis    Gross hematuria 06/16/2020   Renal lithiasis 06/07/2020   Renal stone 06/06/2020   Type 2 diabetes mellitus with stage 3b chronic kidney disease, with long-term current use of insulin ([place]) 05/26/2020   Visual disturbance 03/30/2020   Chronic pain of left knee 03/30/2020   Snores 03/30/2020   Chronic kidney disease, stage 3b ([place]) 03/30/2020   Midline low back pain without sciatica 03/30/2020    Hospital discharge follow-up 02/25/2020   Type 2 diabetes mellitus with diabetic polyneuropathy, with long-term current use of insulin ([place]) 02/23/2020   Syncope 02/08/2020   Renal stones 01/30/2020   Diminished pulses in lower extremity 01/26/2020   AKI (acute kidney injury) ([place]) 01/26/2020   CKD (chronic kidney disease) stage 4, GFR 15-29 ml/min (HCC) 01/05/2020   Hyperkalemia 01/05/2020   Long term (current) use of anticoagulants 12/22/2019   Elevated cholesterol 12/17/2019   Primary osteoarthritis of left knee 12/17/2019   Arthritis 12/17/2019   Type 2 diabetes mellitus with hyperglycemia, with long-term current use of insulin ([place]) 12/17/2019   History of pulmonary embolism 12/17/2019   Essential hypertension 12/17/2019   Past Medical History:  Diagnosis Date   Arthritis    Chronic kidney disease    reduced kidney function. Stage III   Diabetes mellitus without complication (HCC)    DVT (deep venous thrombosis) [facility])    age indeterminate LLE DVT 02/09/20   Dyspnea    On exertion   History of kidney stones    History of pulmonary embolus (PE)    Hypertension    Lipidemia    Prostate enlargement    Pulmonary embolism ([place]) 2009   Sleep apnea     Family History  Problem Relation Age of Onset   Cancer Mother    Heart disease  Father     Past Surgical History:  Procedure Laterality Date   CYSTOSCOPY W/ URETERAL STENT PLACEMENT Left 10/08/2020   Procedure: CYSTOSCOPY WITH RETROGRADE PYELOGRAM/URETERAL STENT PLACEMENT;  Surgeon: Irine Seal, MD;  Location: [place];  Service: Urology;  Laterality: Left;   CYSTOSCOPY/URETEROSCOPY/HOLMIUM LASER/STENT PLACEMENT Right 06/07/2020   Procedure: CYSTOSCOPY/URETEROSCOPY/HOLMIUM LASER/STENT PLACEMENT;  Surgeon: Lucas Mallow, MD;  Location[facility];  Service: Urology;  Laterality: Right;   CYSTOSCOPY/URETEROSCOPY/HOLMIUM LASER/STENT PLACEMENT Left 10/19/2020   Procedure: CYSTOSCOPY LEFT URETEROSCOPY/HOLMIUM LASER/STENT EXCHANGE;   Surgeon: Irine Seal, MD;  Location[facility];  Service: Urology;  Laterality: Left;   TOTAL KNEE ARTHROPLASTY Left 08/06/2021   Procedure: LEFT TOTAL KNEE ARTHROPLASTY;  Surgeon: Leandrew Koyanagi, MD;  Location: [place];  Service: Orthopedics;  Laterality: Left;   Social History   Occupational History   Occupation: retired  Tobacco Use   Smoking status: Never   Smokeless tobacco: Never  Vaping Use   Vaping Use: Never used  Substance and Sexual Activity   Alcohol use: Yes    Alcohol/week: 1.0 standard drink    Types: 1 Cans of beer per week    Comment: social   Drug use: Never   Sexual activity: Yes

## 2021-09-20 ENCOUNTER — Other Ambulatory Visit: Payer: Self-pay

## 2021-09-20 ENCOUNTER — Other Ambulatory Visit: Payer: Self-pay | Admitting: Family

## 2021-09-20 ENCOUNTER — Ambulatory Visit: Payer: Medicare Other | Admitting: Physical Therapy

## 2021-09-20 ENCOUNTER — Encounter: Payer: Self-pay | Admitting: Physical Therapy

## 2021-09-20 DIAGNOSIS — R2689 Other abnormalities of gait and mobility: Secondary | ICD-10-CM | POA: Diagnosis not present

## 2021-09-20 DIAGNOSIS — M6281 Muscle weakness (generalized): Secondary | ICD-10-CM

## 2021-09-20 NOTE — Therapy (Signed)
[place], [place], [address] Phone: [phone]   Fax:  [phone]  Physical Therapy Treatment  Patient Details  Name: [name] MRN: [id number] Date of Birth: 04-04-1956 Referring Provider (PT): Dr. Erlinda Hong   Encounter Date: 09/20/2021   PT End of Session - 09/20/21 1204     Visit Number 3    Number of Visits 12    Date for PT Re-Evaluation 10/23/21    Authorization Type MCR/MCD    PT Start Time 1102    PT Stop Time 2229    PT Time Calculation (min) 46 min    Activity Tolerance Patient tolerated treatment well    Behavior During Therapy [facility] for tasks assessed/performed             Past Medical History:  Diagnosis Date   Arthritis    Chronic kidney disease    reduced kidney function. Stage III   Diabetes mellitus without complication (HCC)    DVT (deep venous thrombosis) [facility])    age indeterminate LLE DVT 02/09/20   Dyspnea    On exertion   History of kidney stones    History of pulmonary embolus (PE)    Hypertension    Lipidemia    Prostate enlargement    Pulmonary embolism ([place]) 2009   Sleep apnea     Past Surgical History:  Procedure Laterality Date   CYSTOSCOPY W/ URETERAL STENT PLACEMENT Left 10/08/2020   Procedure: CYSTOSCOPY WITH RETROGRADE PYELOGRAM/URETERAL STENT PLACEMENT;  Surgeon: Irine Seal, MD;  Location: [place];  Service: Urology;  Laterality: Left;   CYSTOSCOPY/URETEROSCOPY/HOLMIUM LASER/STENT PLACEMENT Right 06/07/2020   Procedure: CYSTOSCOPY/URETEROSCOPY/HOLMIUM LASER/STENT PLACEMENT;  Surgeon: Lucas Mallow, MD;  [facility];  Service: Urology;  Laterality: Right;   CYSTOSCOPY/URETEROSCOPY/HOLMIUM LASER/STENT PLACEMENT Left 10/19/2020   Procedure: CYSTOSCOPY LEFT URETEROSCOPY/HOLMIUM LASER/STENT EXCHANGE;  Surgeon: Irine Seal, MD;  [facility];  Service: Urology;  Laterality: Left;   TOTAL KNEE ARTHROPLASTY Left 08/06/2021   Procedure: LEFT TOTAL KNEE  ARTHROPLASTY;  Surgeon: Leandrew Koyanagi, MD;  Location: [place];  Service: Orthopedics;  Laterality: Left;    There were no vitals filed for this visit.   Subjective Assessment - 09/20/21 1107     Subjective My Knee is feeling a little better at night but I notice I am more sore in the back of my knee now    Pertinent History L TKR 08/06/21    Limitations Standing;House hold activities    How long can you sit comfortably? unlimited    How long can you stand comfortably? hour    How long can you walk comfortably? 30 to 45 minutes    Patient Stated Goals I would like to get back to normal gait; ideally not use the cane.    Currently in Pain? Yes    Pain Score 3     Pain Location Knee    Pain Orientation Left    Pain Descriptors / Indicators Aching    Pain Type Surgical pain                [facility] PT Assessment - 09/20/21 0001       AROM   Left Knee Extension 17    Left Knee Flexion 114           [facility] Adult PT Treatment/Exercise:   Therapeutic Exercise:  Recumbent bike able to do full rotations with complete revolution with seat maximal distance. After 1 minutes able to bring  slightly closer and increase knee flexion.  Long sitting with added strap for DF with SAQ  3 sets - 10 reps with ball Retro walking / stepping to stretch hamstring Wall slides with knee ext engagment  10 x with 10 sec hold Supine Knee Extension Stretch on using stretch out strap and foot on ball 2  sets - 10 reps on left Active Straight Leg Raise - 3 sets - 10 reps  Standing Terminal Knee Extension with Resistance - 2 sets - 10 reps - 3 sec hold on left with ball on wall sit  to stand 2 x 5 with 25 # KB. Pt wt bears to right Sitting with knee flexion assisted with R to max stretch   Manual Therapy: 45 sec  hamstring hold x 5 with 1 min rest  between while PT uses massage wand over quad and ITB on L Increase knee extension with AP mobs,  patellar mobs,  Pt in prone with knee flexion with IR and ER  mobs  Neuromuscular re-ed:       Therapeutic Activity:  Gait:   Pt enters with antalgic gait due to decrease ext of Left knee ( 17 degrees deficit). Pt with decrease arm swing and decrease IR of Left hip . Pt worked  on retro walking and Hamstring walking. VC for heel toe gait and increasing arm swing.  Pt continues with antagic gait but less pronounces at end of session     Self Care:     ITEMS NOT PERFORMED TODAY:     Wall slides with knee ext engagment  Seated Hamstring Stretch -3 reps - 30-60 sec hold on left Supine Knee Extension Stretch on Towel Roll - 3 sets - 10 reps on left Active Straight Leg Raise - 3 sets - 10 reps Prone Quadriceps Stretch with Strap - 1 sets - 3 reps - 30-60 sec hold on Left Sitting with knee flexion assisted with R to max stretch Prone Quadriceps Stretch with Strap - 1 sets - 3 reps - 30-60 sec hold on Left                      PT Education - 09/20/21 1207     Education Details reinforced HEP, and gait instruction    Person(s) Educated Patient    Methods Explanation;Demonstration;Tactile cues;Verbal cues    Comprehension Verbalized understanding;Returned demonstration              PT Short Term Goals - 09/11/21 0849       PT SHORT TERM GOAL #1   Title Pt will be indep with HEP to assist with improved functional mobility    Baseline not issued at eval    Time 3    Period Weeks    Status New    Target Date 10/02/21      PT SHORT TERM GOAL #2   Title Pt will demo improved L knee extension to lacking </= -10 degrees measured supine to assist with gait    Baseline -19 degrees    Time 3    Period Weeks    Status New    Target Date 10/02/21      PT SHORT TERM GOAL #3   Title pt will demo improved L knee flexion to >/=108 degrees to assist with normalized gait    Baseline 100 degrees    Time 3    Period Weeks    Status New    Target Date 10/02/21  PT Long Term Goals - 09/11/21 0843        PT LONG TERM GOAL #1   Title Pt will be able to ambulate x 45 min without a cane with </=2/10 L knee pain    Baseline 30 min    Time 6    Period Weeks    Status New    Target Date 10/23/21      PT LONG TERM GOAL #2   Title Pt will be able to stand x 1.5 hours to perform household activities with </=3/10 L knee pain    Baseline half hour to an hour depending on the day    Time 6    Period Weeks    Status New    Target Date 10/23/21      PT LONG TERM GOAL #3   Title pt will be able to walk up/down stairs reciprocally at home with good control with pain </=3/10 L knee    Baseline performing step to    Time 6    Period Weeks    Status New    Target Date 10/23/21      PT LONG TERM GOAL #4   Title Pt will be able to demo improved gait with heel strike and knee flex/ext L at approp phases of gait x 75% of the time with least restrictive assistive device.    Baseline unable    Time 6    Period Weeks    Status New    Target Date 10/23/21      PT LONG TERM GOAL #5   Title pt will demo improved foto score to 58% function    Baseline 44%    Time 6    Period Weeks    Status New    Target Date 10/23/21                   Plan - 09/20/21 1216     Clinical Impression Statement Pt enters clinic with pronounced antalgic gait on left and use of cane.  Pt worked on gait and maximizing extension  with manual and mobs, retro walking and hamstring walking.  AROM of Left knee 114 flex, and ext 17 extension deficit today.  Pt was able to increase arm swing and pelvic rotataion post exercises with gait.  Mr Alpert is compliant with exercises but he is stiffness dominant throughout his other joints.  Pt is making good progress but should be further along with extension.  Pt did complain of pain in Left ITB and lateral hamsting 3/10 today.  Will continue to progress as pt tolerates.    Personal Factors and Comorbidities Comorbidity 1;Comorbidity 2;Comorbidity 3+    Comorbidities Hx of PE,  LBP, syncope hx, DM 2, dyspnea on exertion    Examination-Activity Limitations Squat;Stairs;Locomotion Level;Other    Examination-Participation Restrictions Cleaning;Shop;Community Activity    PT Frequency 2x / week    PT Duration 6 weeks    PT Treatment/Interventions ADLs/Self Care Home Management;Cryotherapy;DME Instruction;Gait training;Stair training;Functional mobility training;Neuromuscular re-education;Balance training;Therapeutic exercise;Therapeutic activities;Patient/family education;Manual techniques;Passive range of motion;Dry needling;Taping    PT Next Visit Plan how is sleep.  GOALS , go over FOTO report    PT Home Exercise Plan Access Code: [email or web address]    Consulted and Agree with Plan of Care Patient             Patient will benefit from skilled therapeutic intervention in order to improve the following deficits and impairments:  Abnormal gait, Decreased endurance, Decreased mobility,  Difficulty walking, Hypomobility, Increased edema, Decreased range of motion, Decreased activity tolerance, Decreased strength, Impaired flexibility, Pain  Visit Diagnosis: Other abnormalities of gait and mobility  Muscle weakness (generalized)     Problem List Patient Active Problem List   Diagnosis Date Noted   Status post total left knee replacement 08/06/2021   Encounter for medication monitoring 07/10/2021   UTI (urinary tract infection) 10/09/2020   Acute UTI 10/08/2020   Ureterolithiasis    Gross hematuria 06/16/2020   Renal lithiasis 06/07/2020   Renal stone 06/06/2020   Type 2 diabetes mellitus with stage 3b chronic kidney disease, with long-term current use of insulin ([place]) 05/26/2020   Visual disturbance 03/30/2020   Chronic pain of left knee 03/30/2020   Snores 03/30/2020   Chronic kidney disease, stage 3b ([place]) 03/30/2020   Midline low back pain without sciatica 03/30/2020   Hospital discharge follow-up 02/25/2020   Type 2 diabetes mellitus with diabetic  polyneuropathy, with long-term current use of insulin ([place]) 02/23/2020   Syncope 02/08/2020   Renal stones 01/30/2020   Diminished pulses in lower extremity 01/26/2020   AKI (acute kidney injury) ([place]) 01/26/2020   CKD (chronic kidney disease) stage 4, GFR 15-29 ml/min (HCC) 01/05/2020   Hyperkalemia 01/05/2020   Long term (current) use of anticoagulants 12/22/2019   Elevated cholesterol 12/17/2019   Primary osteoarthritis of left knee 12/17/2019   Arthritis 12/17/2019   Type 2 diabetes mellitus with hyperglycemia, with long-term current use of insulin ([place]) 12/17/2019   History of pulmonary embolism 12/17/2019   Essential hypertension 12/17/2019   Voncille Lo, PT, [place] Certified Exercise Expert for the Aging Adult  09/20/21 12:24 PM Phone: [phone] Fax: [place] [address] [place], [place], [address] Phone: [phone]   Fax:  [phone]  Name: Keilon Ressel MRN: [id number] Date of Birth: 06/23/1956

## 2021-09-24 ENCOUNTER — Telehealth: Payer: Self-pay | Admitting: Physical Therapy

## 2021-09-24 ENCOUNTER — Ambulatory Visit: Payer: Medicare Other | Admitting: Physical Therapy

## 2021-09-24 NOTE — Telephone Encounter (Addendum)
Contacted and scheduled pt for 11/28 per pt request, he did not have availability this week.  Pt is over due for INR check. Noticed warfarin refill was send tin by PCP on 11/17. Please send all refill request to this coumadin nurse in order to assure pt is compliant with coumadin clinic apts. If pt is not compliant, refills can be reduced to ensure pt f/u with clinic.

## 2021-09-24 NOTE — Telephone Encounter (Signed)
Spoke with patient regarding messed PT appointment. Patient stated he forgot about the appointment and was not feeling well. Patient was advised of attendance policy and reminded of next scheduled appointment. Patient expressed understanding.   Hilda Blades, PT, DPT, LAT, ATC 09/24/21  1:36 PM Phone: [phone] Fax: [phone]

## 2021-09-26 ENCOUNTER — Ambulatory Visit: Payer: Medicare Other | Admitting: Rehabilitative and Restorative Service Providers"

## 2021-10-01 ENCOUNTER — Ambulatory Visit: Payer: Medicare Other | Admitting: Physical Therapy

## 2021-10-01 ENCOUNTER — Ambulatory Visit: Payer: Medicare Other

## 2021-10-02 ENCOUNTER — Other Ambulatory Visit: Payer: Self-pay | Admitting: Family Medicine

## 2021-10-03 ENCOUNTER — Ambulatory Visit (INDEPENDENT_AMBULATORY_CARE_PROVIDER_SITE_OTHER): Payer: Medicare Other

## 2021-10-03 DIAGNOSIS — Z7901 Long term (current) use of anticoagulants: Secondary | ICD-10-CM | POA: Diagnosis not present

## 2021-10-03 LAB — POCT INR: INR: 2.3 (ref 2.0–3.0)

## 2021-10-03 NOTE — Patient Instructions (Addendum)
Pre visit review using our clinic review tool, if applicable. No additional management support is needed unless otherwise documented below in the visit note.  Continue to take 5 mg daily except take 7.5 mg on Thurs and Sat. Recheck in 6 wks.

## 2021-10-03 NOTE — Progress Notes (Signed)
Continue to take 5 mg daily except take 7.5 mg on Thurs and Sat. Recheck in 1 wks.

## 2021-10-04 ENCOUNTER — Ambulatory Visit: Payer: Medicare Other | Attending: Orthopaedic Surgery | Admitting: Physical Therapy

## 2021-10-04 ENCOUNTER — Other Ambulatory Visit: Payer: Self-pay

## 2021-10-04 DIAGNOSIS — R2689 Other abnormalities of gait and mobility: Secondary | ICD-10-CM | POA: Insufficient documentation

## 2021-10-04 DIAGNOSIS — M6281 Muscle weakness (generalized): Secondary | ICD-10-CM | POA: Insufficient documentation

## 2021-10-04 NOTE — Therapy (Signed)
[place], [place], [address] Phone: [phone]   Fax:  [phone]  Physical Therapy Treatment  Patient Details  Name: [name] MRN: [id number] Date of Birth: 25-Mar-1956 Referring Provider (PT): Dr. Erlinda Hong   Encounter Date: 10/04/2021   PT End of Session - 10/04/21 0934     Visit Number 4    Number of Visits 12    Date for PT Re-Evaluation 10/23/21    Authorization Type MCR/MCD    PT Start Time 0934    PT Stop Time 1015    PT Time Calculation (min) 41 min    Activity Tolerance Patient tolerated treatment well    Behavior During Therapy [facility] for tasks assessed/performed             Past Medical History:  Diagnosis Date   Arthritis    Chronic kidney disease    reduced kidney function. Stage III   Diabetes mellitus without complication (HCC)    DVT (deep venous thrombosis) [facility])    age indeterminate LLE DVT 02/09/20   Dyspnea    On exertion   History of kidney stones    History of pulmonary embolus (PE)    Hypertension    Lipidemia    Prostate enlargement    Pulmonary embolism ([place]) 2009   Sleep apnea     Past Surgical History:  Procedure Laterality Date   CYSTOSCOPY W/ URETERAL STENT PLACEMENT Left 10/08/2020   Procedure: CYSTOSCOPY WITH RETROGRADE PYELOGRAM/URETERAL STENT PLACEMENT;  Surgeon: Irine Seal, MD;  Location: [place];  Service: Urology;  Laterality: Left;   CYSTOSCOPY/URETEROSCOPY/HOLMIUM LASER/STENT PLACEMENT Right 06/07/2020   Procedure: CYSTOSCOPY/URETEROSCOPY/HOLMIUM LASER/STENT PLACEMENT;  Surgeon: Lucas Mallow, MD;  [facility];  Service: Urology;  Laterality: Right;   CYSTOSCOPY/URETEROSCOPY/HOLMIUM LASER/STENT PLACEMENT Left 10/19/2020   Procedure: CYSTOSCOPY LEFT URETEROSCOPY/HOLMIUM LASER/STENT EXCHANGE;  Surgeon: Irine Seal, MD;  [facility];  Service: Urology;  Laterality: Left;   TOTAL KNEE ARTHROPLASTY Left 08/06/2021   Procedure: LEFT TOTAL KNEE  ARTHROPLASTY;  Surgeon: Leandrew Koyanagi, MD;  Location: [place];  Service: Orthopedics;  Laterality: Left;    There were no vitals filed for this visit.   Subjective Assessment - 10/04/21 0938     Subjective I am using a cane because I am having pain in my knee.  I am getting about 7 hours of sleep better than it was  I am not taking Tramadol.  I am a 5/10 today.  I had the flu and so did my wife. It wasnt too bad but I could not come to therapy    Pertinent History L TKR 08/06/21    Limitations Standing;House hold activities    How long can you sit comfortably? unlimited    How long can you stand comfortably? hour    How long can you walk comfortably? 30 minutes    Patient Stated Goals I would like to get back to normal gait; ideally not use the cane.    Currently in Pain? Yes    Pain Score 5     Pain Location Knee    Pain Orientation Left    Pain Descriptors / Indicators Aching    Pain Type Chronic pain;Surgical pain    Pain Onset More than a month ago    Pain Frequency Intermittent                [facility] Adult PT Treatment/Exercise:   Therapeutic Exercise:   Recumbent bike able to  do full rotations with complete revolution with seat maximal distance with knee at 100 degrees flexion for 6 min Supine Knee Extension Stretch on Towel Roll - 3 sets - 10 reps on left   Long sitting with added strap for DF with SAQ  3 sets - 10 reps with ball and PT assist with strap for distraction of L LE simultaneously Terminal  knee ext with BTB 3 x 10 Step ups with Left  knee 3  x 10 Step downs with Left knee remaining with eccentric contol 3 x 10 Retro walking / stepping to stretch hamstring Wall slides with knee ext engagment  10 x with 10 sec hold Active Straight Leg Raise - 3 sets - 10 reps  sit  to stand 2 x 5 with 25 # KB. Pt wt bears to right    Manual Therapy: 45 sec  hamstring hold x 5 with 1 min rest  between while PT uses massage wand over quad and ITB on L Increase knee extension  with AP mobs,  patellar mobs,     Neuromuscular re-ed:       Therapeutic Activity:   Gait:    Pt enters with antalgic gait due to decrease ext of Left knee ( 17 degrees deficit). Pt with decrease arm swing and decrease IR of Left hip . Pt worked  on retro walking and Hamstring walking. VC for heel toe gait and increasing arm swing.  Pt continues with antagic gait but less pronounces at end of session     Self Care:     ITEMS NOT PERFORMED TODAY:       Seated Hamstring Stretch -3 reps - 30-60 sec hold on left  Active Straight Leg Raise - 3 sets - 10 reps Prone Quadriceps Stretch with Strap - 1 sets - 3 reps - 30-60 sec hold on Left Sitting with knee flexion assisted with R to max stretch Prone Quadriceps Stretch with Strap - 1 sets - 3 reps - 30-60 sec hold on Left   [facility] PT Assessment - 10/04/21 0001       AROM   Left Knee Extension 17    Left Knee Flexion 118                                      PT Short Term Goals - 10/04/21 0946       PT SHORT TERM GOAL #1   Title Pt will be indep with HEP to assist with improved functional mobility    Baseline is independent  usually every other day    Time 3    Period Weeks    Status Achieved    Target Date 10/02/21      PT SHORT TERM GOAL #2   Title Pt will demo improved L knee extension to lacking </= -10 degrees measured supine to assist with gait    Baseline -17    Time 3    Period Weeks    Status On-going      PT SHORT TERM GOAL #3   Title pt will demo improved L knee flexion to >/=108 degrees to assist with normalized gait    Baseline 118    Time 3    Period Weeks    Status Achieved    Target Date 10/02/21               PT Long Term Goals -  10/04/21 1003       PT LONG TERM GOAL #1   Title Pt will be able to ambulate x 45 min without a cane with </=2/10 L knee pain    Baseline 30 min sometimes uses cane    Time 6    Period Weeks    Status On-going      PT LONG TERM  GOAL #2   Title Pt will be able to stand x 1.5 hours to perform household activities with </=3/10 L knee pain    Baseline one hour  4 to 5/10    Time 6    Period Weeks    Status On-going      PT LONG TERM GOAL #3   Title pt will be able to walk up/down stairs reciprocally at home with good control with pain </=3/10 L knee    Baseline alternating step 5/10    Time 6    Period Weeks    Status On-going      PT LONG TERM GOAL #4   Title Pt will be able to demo improved gait with heel strike and knee flex/ext L at approp phases of gait x 75% of the time with least restrictive assistive device.    Baseline Pt with antalgic gait , still using cane at times but at end of session did not use cane    Time 6    Period Weeks    Status On-going      PT LONG TERM GOAL #5   Title pt will demo improved foto score to 58% function    Baseline 44%    Time 6    Status Unable to assess                   Plan - 10/04/21 0941     Clinical Impression Statement Pt has been ill with the flu/respiratory illness for past two weeks and did not attend PT.  Pt entered with cane and antalgic gait.  Pt AROM of Left knee 17 degrees exte/ 118 flexion. Pt left knee especially stiff and emphasis on extension exercise/ manual this treatment time.  Pt was educated on importance of knee extension through out the the day.  Will continue    Personal Factors and Comorbidities Comorbidity 1;Comorbidity 2;Comorbidity 3+    Comorbidities Hx of PE, LBP, syncope hx, DM 2, dyspnea on exertion    Examination-Activity Limitations Squat;Stairs;Locomotion Level;Other    PT Frequency 2x / week    PT Duration 6 weeks    PT Treatment/Interventions ADLs/Self Care Home Management;Cryotherapy;DME Instruction;Gait training;Stair training;Functional mobility training;Neuromuscular re-education;Balance training;Therapeutic exercise;Therapeutic activities;Patient/family education;Manual techniques;Passive range of motion;Dry  needling;Taping    PT Next Visit Plan how is sleep.  GOALS , go over FOTO report    PT Home Exercise Plan Access Code: [email or web address]    Consulted and Agree with Plan of Care Patient             Patient will benefit from skilled therapeutic intervention in order to improve the following deficits and impairments:  Abnormal gait, Decreased endurance, Decreased mobility, Difficulty walking, Hypomobility, Increased edema, Decreased range of motion, Decreased activity tolerance, Decreased strength, Impaired flexibility, Pain  Visit Diagnosis: Other abnormalities of gait and mobility  Muscle weakness (generalized)     Problem List Patient Active Problem List   Diagnosis Date Noted   Status post total left knee replacement 08/06/2021   Encounter for medication monitoring 07/10/2021   UTI (urinary tract infection) 10/09/2020  Acute UTI 10/08/2020   Ureterolithiasis    Gross hematuria 06/16/2020   Renal lithiasis 06/07/2020   Renal stone 06/06/2020   Type 2 diabetes mellitus with stage 3b chronic kidney disease, with long-term current use of insulin ([place]) 05/26/2020   Visual disturbance 03/30/2020   Chronic pain of left knee 03/30/2020   Snores 03/30/2020   Chronic kidney disease, stage 3b ([place]) 03/30/2020   Midline low back pain without sciatica 03/30/2020   Hospital discharge follow-up 02/25/2020   Type 2 diabetes mellitus with diabetic polyneuropathy, with long-term current use of insulin ([place]) 02/23/2020   Syncope 02/08/2020   Renal stones 01/30/2020   Diminished pulses in lower extremity 01/26/2020   AKI (acute kidney injury) ([place]) 01/26/2020   CKD (chronic kidney disease) stage 4, GFR 15-29 ml/min (HCC) 01/05/2020   Hyperkalemia 01/05/2020   Long term (current) use of anticoagulants 12/22/2019   Elevated cholesterol 12/17/2019   Primary osteoarthritis of left knee 12/17/2019   Arthritis 12/17/2019   Type 2 diabetes mellitus with hyperglycemia, with long-term current use of  insulin ([place]) 12/17/2019   History of pulmonary embolism 12/17/2019   Essential hypertension 12/17/2019    Voncille Lo, PT, [place] Certified Exercise Expert for the Aging Adult  10/04/21 12:33 PM Phone: [phone] Fax: [facility] [address] [place], [place], [address] Phone: [phone]   Fax:  [phone]  Name: Jocsan Mcginley MRN: [id number] Date of Birth: 1956/10/14

## 2021-10-08 ENCOUNTER — Other Ambulatory Visit: Payer: Self-pay | Admitting: Family Medicine

## 2021-10-08 DIAGNOSIS — I10 Essential (primary) hypertension: Secondary | ICD-10-CM

## 2021-10-08 NOTE — Telephone Encounter (Signed)
Chart supports rx refill Last ov: 07/10/2021 Last refill: 07/10/2021

## 2021-10-09 ENCOUNTER — Encounter: Payer: Medicare Other | Admitting: Physical Therapy

## 2021-10-11 ENCOUNTER — Other Ambulatory Visit: Payer: Self-pay

## 2021-10-11 ENCOUNTER — Ambulatory Visit: Payer: Medicare Other | Admitting: Physical Therapy

## 2021-10-11 ENCOUNTER — Encounter: Payer: Self-pay | Admitting: Physical Therapy

## 2021-10-11 DIAGNOSIS — R2689 Other abnormalities of gait and mobility: Secondary | ICD-10-CM | POA: Diagnosis not present

## 2021-10-11 DIAGNOSIS — M6281 Muscle weakness (generalized): Secondary | ICD-10-CM

## 2021-10-11 NOTE — Patient Instructions (Signed)
At home, please walk every day for at least 15 minutes without stopping, Work up to 30 to 45 min  To help straighten you knee.  Use long duration stretch as shown in clinic.  Put Left ankle on 4 thickness towel roll and then use a heating pad on your hamstrings.  Put heavy weight 20# just above knee cap.  Do for 15 min in AM, noon late after noon and evening 4 times a day for 15 minutes  Voncille Lo, PT, [facility] Certified Exercise Expert for the Aging Adult  10/11/21 10:12 AM Phone: [phone] Fax: [phone]

## 2021-10-11 NOTE — Therapy (Signed)
[place], [place], [address] Phone: [phone]   Fax:  [phone]  Physical Therapy Treatment  Patient Details  Name: [name] MRN: [id number] Date of Birth: [birth year] Referring Provider (PT): Dr. Erlinda Hong   Encounter Date: 10/11/2021   PT End of Session - 10/11/21 0928     Visit Number 5    Number of Visits 12    Date for PT Re-Evaluation 10/23/21    Authorization Type MCR/MCD    Progress Note Due on Visit 10    PT Start Time 0930    PT Stop Time 1015    PT Time Calculation (min) 45 min    Activity Tolerance Patient tolerated treatment well    Behavior During Therapy [facility] for tasks assessed/performed             Past Medical History:  Diagnosis Date   Arthritis    Chronic kidney disease    reduced kidney function. Stage III   Diabetes mellitus without complication (HCC)    DVT (deep venous thrombosis) [facility])    age indeterminate LLE DVT 02/09/20   Dyspnea    On exertion   History of kidney stones    History of pulmonary embolus (PE)    Hypertension    Lipidemia    Prostate enlargement    Pulmonary embolism ([place]) 2009   Sleep apnea     Past Surgical History:  Procedure Laterality Date   CYSTOSCOPY W/ URETERAL STENT PLACEMENT Left 10/08/2020   Procedure: CYSTOSCOPY WITH RETROGRADE PYELOGRAM/URETERAL STENT PLACEMENT;  Surgeon: Irine Seal, MD;  Location: [place];  Service: Urology;  Laterality: Left;   CYSTOSCOPY/URETEROSCOPY/HOLMIUM LASER/STENT PLACEMENT Right 06/07/2020   Procedure: CYSTOSCOPY/URETEROSCOPY/HOLMIUM LASER/STENT PLACEMENT;  Surgeon: Lucas Mallow, MD;  [facility];  Service: Urology;  Laterality: Right;   CYSTOSCOPY/URETEROSCOPY/HOLMIUM LASER/STENT PLACEMENT Left 10/19/2020   Procedure: CYSTOSCOPY LEFT URETEROSCOPY/HOLMIUM LASER/STENT EXCHANGE;  Surgeon: Irine Seal, MD;  [facility];  Service: Urology;  Laterality: Left;   TOTAL KNEE ARTHROPLASTY Left 08/06/2021    Procedure: LEFT TOTAL KNEE ARTHROPLASTY;  Surgeon: Leandrew Koyanagi, MD;  Location: [place];  Service: Orthopedics;  Laterality: Left;    There were no vitals filed for this visit.   Subjective Assessment - 10/11/21 0933     Subjective I am a 3/-4 out 10 most the time.  I am 0/10 when I am resting.  Going up the stairs is getting better but going down is tough.    Pertinent History L TKR 08/06/21    Limitations Standing;House hold activities    Patient Stated Goals I would like to get back to normal gait; ideally not use the cane.    Currently in Pain? Yes    Pain Score 4     Pain Location Knee    Pain Orientation Left    Pain Descriptors / Indicators Aching    Pain Type Chronic pain    Pain Onset More than a month ago                   [facility] Adult PT Treatment/Exercise:   Therapeutic Exercise:   Wall sit  6 x 6 to 10 sec hold with VC and TC for position and execution.  Pt fatigues easily today. Step ups with Left  knee 3  x 10 on 8 inch step Standing with L UE on 8 in step.   Step downs with Left knee remaining with eccentric contol 3 x 10  with bil UE support on 8 inch step down to 4 inch step (total 4 inch for step down. Retro walking / stepping to stretch hamstring Supine Knee Extension Stretch on Towel Roll - 3 sets - 10 reps on left  Long sitting with added strap for DF with SAQ  3 sets - 10 reps with ball and PT assist with strap for distraction of L LE simultaneously     Wall slides with knee ext engagment  10 x with 10 sec hold Active Straight Leg Raise - 3 sets - 10 reps  sit  to stand 2 x 5 with 25 # KB. Pt wt bears to right     Manual Therapy: 45 sec  hamstring hold x 5 with 1 min rest  between while PT uses massage wand over quad and ITB on L Increase knee extension with AP mobs,  patellar mobs,      Neuromuscular re-ed:       Therapeutic Activity:   Gait:   Pt enters with antalgic gait due to decrease ext of Left knee ( 15degrees deficit). Pt with  decrease arm swing and decrease IR of Left hip .  Pt also carries cane but does not use.  Pt 6 MWT today 991 Feet  ( 1893 ft normal for 67-69) Pt worked  on retro walking and Hamstring walking. VC for heel toe gait and increasing arm swing.  Pt continues with antagic gait but less pronounces at end of session     Self Care:     ITEMS NOT PERFORMED TODAY:        Seated Hamstring Stretch -3 reps - 30-60 sec hold on left Terminal  knee ext with BTB 3 x 10  Active Straight Leg Raise - 3 sets - 10 reps Prone Quadriceps Stretch with Strap - 1 sets - 3 reps - 30-60 sec hold on Left Sitting with knee flexion assisted with R to max stretch Prone Quadriceps Stretch with Strap - 1 sets - 3 reps - 30-60 sec hold on Left       [facility] PT Assessment - 10/11/21 0001       AROM   Left Knee Extension 15   14 PROM   Left Knee Flexion 120   PROM 122                                   PT Education - 10/11/21 1013     Education Details Gave pt instructions for heavy load long duration 15 min stretch 3-4 times a day along with regular exercise    Person(s) Educated Patient    Methods Explanation;Demonstration;Tactile cues;Verbal cues;Handout    Comprehension Verbalized understanding;Returned demonstration              PT Short Term Goals - 10/11/21 1001       PT SHORT TERM GOAL #1   Title Pt will be indep with HEP to assist with improved functional mobility    Baseline is independent  usually every other day    Time 3    Period Weeks    Status Achieved    Target Date 10/02/21      PT SHORT TERM GOAL #2   Title Pt will demo improved L knee extension to lacking </= -10 degrees measured supine to assist with gait    Baseline -15    Time 3    Period Weeks  Status Partially Met    Target Date 10/02/21      PT SHORT TERM GOAL #3   Title pt will demo improved L knee flexion to >/=108 degrees to assist with normalized gait    Baseline 118    Time 3    Period  Weeks    Status Achieved    Target Date 10/02/21               PT Long Term Goals - 10/11/21 1016       PT LONG TERM GOAL #1   Title Pt will be able to ambulate x 45 min without a cane with </=2/10 L knee pain    Baseline at one time pt walks, will try to do the treadmill  walks total time in day 45 minutes. encourage consistant walking for endurance    Time 6    Period Weeks    Status On-going    Target Date 10/23/21      PT LONG TERM GOAL #2   Title Pt will be able to stand x 1.5 hours to perform household activities with </=3/10 L knee pain    Baseline one hour with 3-4 /10    Time 6    Period Weeks    Status On-going    Target Date 10/23/21      PT LONG TERM GOAL #3   Title pt will be able to walk up/down stairs reciprocally at home with good control with pain </=3/10 L knee    Baseline alternating step 5/10 but with not good knee control needs to work on strength    Time 6    Period Weeks    Target Date 10/23/21      PT LONG TERM GOAL #4   Title Pt will be able to demo improved gait with heel strike and knee flex/ext L at approp phases of gait x 75% of the time with least restrictive assistive device.    Baseline Pt now utilizing the cane only for going down steps.    Time 6    Period Weeks    Status On-going    Target Date 10/23/21      PT LONG TERM GOAL #5   Title pt will demo improved foto score to 58% function    Baseline 44% eval  10-11-21 47%    Time 6    Period Weeks    Status On-going    Target Date 10/23/21                   Plan - 10/11/21 0930     Clinical Impression Statement Pt with FOTO score improved minimally form 44 % to 47%. Pt does not move enough during the day. He states he walks a total of about 45 minutes.  Discussed importance of walking for 15 to 45 min consecutively to get back to a level of function in which he can exercise meaningfully.  Pt AROM ext on left 15 degrees, flexion 120. Pt had 4/10 pain and initially walks  into clinic carrying a cane which he states he only uses to go down steps.  After 6 MWT 991Feet ( normal for age 1 ft)  Pt knee flexion is adequate for negotiating steps.  Pt does exhibit quad and hamstring weakness and cannot hold wall sit for more than 6 to 10 sec.  Pt given HEP to have heavy load long duration of knee extension with 20 lb wet and moist hot pack on  hamstrings. to use at home3-4 times a day for 15 min. Will continue toward completion of goals    Personal Factors and Comorbidities Comorbidity 1;Comorbidity 2;Comorbidity 3+    Comorbidities Hx of PE, LBP, syncope hx, DM 2, dyspnea on exertion    Examination-Participation Restrictions Cleaning;Shop;Community Activity    PT Frequency 2x / week    PT Duration 6 weeks    PT Treatment/Interventions ADLs/Self Care Home Management;Cryotherapy;DME Instruction;Gait training;Stair training;Functional mobility training;Neuromuscular re-education;Balance training;Therapeutic exercise;Therapeutic activities;Patient/family education;Manual techniques;Passive range of motion;Dry needling;Taping    PT Next Visit Plan how is sleep.  GOALS , go over FOTO report    PT Home Exercise Plan Access Code: [email or web address]    Consulted and Agree with Plan of Care Patient             Patient will benefit from skilled therapeutic intervention in order to improve the following deficits and impairments:  Abnormal gait, Decreased endurance, Decreased mobility, Difficulty walking, Hypomobility, Increased edema, Decreased range of motion, Decreased activity tolerance, Decreased strength, Impaired flexibility, Pain  Visit Diagnosis: Other abnormalities of gait and mobility  Muscle weakness (generalized)     Problem List Patient Active Problem List   Diagnosis Date Noted   Status post total left knee replacement 08/06/2021   Encounter for medication monitoring 07/10/2021   UTI (urinary tract infection) 10/09/2020   Acute UTI 10/08/2020   Ureterolithiasis     Gross hematuria 06/16/2020   Renal lithiasis 06/07/2020   Renal stone 06/06/2020   Type 2 diabetes mellitus with stage 3b chronic kidney disease, with long-term current use of insulin ([place]) 05/26/2020   Visual disturbance 03/30/2020   Chronic pain of left knee 03/30/2020   Snores 03/30/2020   Chronic kidney disease, stage 3b ([place]) 03/30/2020   Midline low back pain without sciatica 03/30/2020   Hospital discharge follow-up 02/25/2020   Type 2 diabetes mellitus with diabetic polyneuropathy, with long-term current use of insulin ([place]) 02/23/2020   Syncope 02/08/2020   Renal stones 01/30/2020   Diminished pulses in lower extremity 01/26/2020   AKI (acute kidney injury) ([place]) 01/26/2020   CKD (chronic kidney disease) stage 4, GFR 15-29 ml/min (HCC) 01/05/2020   Hyperkalemia 01/05/2020   Long term (current) use of anticoagulants 12/22/2019   Elevated cholesterol 12/17/2019   Primary osteoarthritis of left knee 12/17/2019   Arthritis 12/17/2019   Type 2 diabetes mellitus with hyperglycemia, with long-term current use of insulin ([place]) 12/17/2019   History of pulmonary embolism 12/17/2019   Essential hypertension 12/17/2019    Voncille Lo, PT, [place] Certified Exercise Expert for the Aging Adult  10/11/21 11:19 AM Phone: [phone] Fax: [place] [facility] [address] [place], [place], [address] Phone: [phone]   Fax:  [phone]  Name: Miquan Tandon MRN: [id number] Date of Birth: 15-Oct-1956

## 2021-10-16 ENCOUNTER — Encounter: Payer: Self-pay | Admitting: Internal Medicine

## 2021-10-17 MED ORDER — HUMULIN 70/30 (70-30) 100 UNIT/ML ~~LOC~~ SUSP: SUBCUTANEOUS | 3 refills | Status: DC

## 2021-10-18 ENCOUNTER — Encounter: Payer: Self-pay | Admitting: Physical Therapy

## 2021-10-18 ENCOUNTER — Other Ambulatory Visit: Payer: Self-pay

## 2021-10-18 ENCOUNTER — Ambulatory Visit: Payer: Medicare Other | Admitting: Physical Therapy

## 2021-10-18 DIAGNOSIS — R2689 Other abnormalities of gait and mobility: Secondary | ICD-10-CM | POA: Diagnosis not present

## 2021-10-18 DIAGNOSIS — M6281 Muscle weakness (generalized): Secondary | ICD-10-CM

## 2021-10-18 NOTE — Therapy (Signed)
[place], [place], [address] Phone: [phone]   Fax:  [phone]  Physical Therapy Treatment  Patient Details  Name: [name] MRN: [id number] Date of Birth: 28-Jun-1956 Referring Provider (PT): Dr. Erlinda Hong   Encounter Date: 10/18/2021   PT End of Session - 10/18/21 1411     Visit Number 6    Number of Visits 12    Date for PT Re-Evaluation 10/23/21    Authorization Type MCR/MCD    Progress Note Due on Visit 10    PT Start Time 1403    PT Stop Time 1445    PT Time Calculation (min) 42 min    Activity Tolerance Patient tolerated treatment well    Behavior During Therapy [facility] for tasks assessed/performed             Past Medical History:  Diagnosis Date   Arthritis    Chronic kidney disease    reduced kidney function. Stage III   Diabetes mellitus without complication (HCC)    DVT (deep venous thrombosis) [facility])    age indeterminate LLE DVT 02/09/20   Dyspnea    On exertion   History of kidney stones    History of pulmonary embolus (PE)    Hypertension    Lipidemia    Prostate enlargement    Pulmonary embolism ([place]) 2009   Sleep apnea     Past Surgical History:  Procedure Laterality Date   CYSTOSCOPY W/ URETERAL STENT PLACEMENT Left 10/08/2020   Procedure: CYSTOSCOPY WITH RETROGRADE PYELOGRAM/URETERAL STENT PLACEMENT;  Surgeon: Irine Seal, MD;  Location: [place];  Service: Urology;  Laterality: Left;   CYSTOSCOPY/URETEROSCOPY/HOLMIUM LASER/STENT PLACEMENT Right 06/07/2020   Procedure: CYSTOSCOPY/URETEROSCOPY/HOLMIUM LASER/STENT PLACEMENT;  Surgeon: Lucas Mallow, MD;  Location: [facility];  Service: Urology;  Laterality: Right;   CYSTOSCOPY/URETEROSCOPY/HOLMIUM LASER/STENT PLACEMENT Left 10/19/2020   Procedure: CYSTOSCOPY LEFT URETEROSCOPY/HOLMIUM LASER/STENT EXCHANGE;  Surgeon: Irine Seal, MD;  Location: [facility];  Service: Urology;  Laterality: Left;   TOTAL KNEE ARTHROPLASTY Left 08/06/2021    Procedure: LEFT TOTAL KNEE ARTHROPLASTY;  Surgeon: Leandrew Koyanagi, MD;  Location: [place];  Service: Orthopedics;  Laterality: Left;    There were no vitals filed for this visit.   Subjective Assessment - 10/18/21 1413     Subjective Patient reports he is doing well, states 0/10 at rest but he does have some pain when he lifts his leg a little bit such as walking with normal gait, but then he doesn't have any pain when he raises the leg up more like when going up stairs.    Patient Stated Goals I would like to get back to normal gait; ideally not use the cane.    Currently in Pain? No/denies    Pain Score 0-No pain    Pain Location Knee    Pain Orientation Left                [facility] PT Assessment - 10/18/21 0001       AROM   Left Knee Extension 8    Left Knee Flexion 112                  [facility] Adult PT Treatment/Exercise:  Therapeutic Exercise: NuStep L5 x 5 min with UE/LE while taking subejct Seated hamstring stretch with manual pressure over knee into extension 3 x 30 sec Slant board calf stretch 3 x 30 sec Standing TKE with black 10 x 5 sec  Modified thomas  stretch with manual pressure into knee flexion 3 x 30 sec Step-up 8" with opposite UE support x 10 Step-down 4" with opposite UE support x 10 Knee extension machine 20# 2 x 15 Knee flexion machine 20# 2 x 15  Manual Therapy: Seated and supine tibfem mobs AP/PA combine with rotational mobs to improve knee flexion and extension Supine PF mobs sup/inf to improve knee flexion and extension Seated knee flexion PROM with tibial IR 5 x 10 sec Supine knee flexion and extension PROM                   PT Education - 10/18/21 1424     Education Details HEP    Person(s) Educated Patient    Methods Explanation;Demonstration;Verbal cues    Comprehension Verbalized understanding;Returned demonstration;Verbal cues required;Need further instruction              PT Short Term Goals - 10/18/21  1503       PT SHORT TERM GOAL #1   Title Pt will be indep with HEP to assist with improved functional mobility    Baseline is independent  usually every other day    Time 3    Period Weeks    Status Achieved    Target Date 10/02/21      PT SHORT TERM GOAL #2   Title Pt will demo improved L knee extension to lacking </= -10 degrees measured supine to assist with gait    Baseline -8    Time 3    Period Weeks    Status Achieved    Target Date 10/02/21      PT SHORT TERM GOAL #3   Title pt will demo improved L knee flexion to >/=108 degrees to assist with normalized gait    Baseline 118    Time 3    Period Weeks    Status Achieved    Target Date 10/02/21               PT Long Term Goals - 10/11/21 1016       PT LONG TERM GOAL #1   Title Pt will be able to ambulate x 45 min without a cane with </=2/10 L knee pain    Baseline at one time pt walks, will try to do the treadmill  walks total time in day 45 minutes. encourage consistant walking for endurance    Time 6    Period Weeks    Status On-going    Target Date 10/23/21      PT LONG TERM GOAL #2   Title Pt will be able to stand x 1.5 hours to perform household activities with </=3/10 L knee pain    Baseline one hour with 3-4 /10    Time 6    Period Weeks    Status On-going    Target Date 10/23/21      PT LONG TERM GOAL #3   Title pt will be able to walk up/down stairs reciprocally at home with good control with pain </=3/10 L knee    Baseline alternating step 5/10 but with not good knee control needs to work on strength    Time 6    Period Weeks    Target Date 10/23/21      PT LONG TERM GOAL #4   Title Pt will be able to demo improved gait with heel strike and knee flex/ext L at approp phases of gait x 75% of the time with least restrictive assistive  device.    Baseline Pt now utilizing the cane only for going down steps.    Time 6    Period Weeks    Status On-going    Target Date 10/23/21      PT LONG  TERM GOAL #5   Title pt will demo improved foto score to 58% function    Baseline 44% eval  10-11-21 47%    Time 6    Period Weeks    Status On-going    Target Date 10/23/21                   Plan - 10/18/21 1425     Clinical Impression Statement Patient tolerated therapy well with no adverse effects. He demonstrates improvement in knee extension this visit but slight reduction in knee flexion. He reports increased knee pain with exercise but this resolves at completion of exercises. He continues to demonstrate limitations in knee motion and strength that impair gait and likely leading to knee pain with activity. No changes made to HEP, encouraged continued focus on knee motion at home. Patient would benefit from continued skilled PT to progress his mobility and strength in order to reduce pain and maximize functional ability.    PT Treatment/Interventions ADLs/Self Care Home Management;Cryotherapy;DME Instruction;Gait training;Stair training;Functional mobility training;Neuromuscular re-education;Balance training;Therapeutic exercise;Therapeutic activities;Patient/family education;Manual techniques;Passive range of motion;Dry needling;Taping    PT Next Visit Plan how is sleep.  GOALS , go over FOTO report    PT Home Exercise Plan Access Code: [email or web address]    Consulted and Agree with Plan of Care Patient             Patient will benefit from skilled therapeutic intervention in order to improve the following deficits and impairments:  Abnormal gait, Decreased endurance, Decreased mobility, Difficulty walking, Hypomobility, Increased edema, Decreased range of motion, Decreased activity tolerance, Decreased strength, Impaired flexibility, Pain  Visit Diagnosis: Other abnormalities of gait and mobility  Muscle weakness (generalized)     Problem List Patient Active Problem List   Diagnosis Date Noted   Status post total left knee replacement 08/06/2021   Encounter for medication  monitoring 07/10/2021   UTI (urinary tract infection) 10/09/2020   Acute UTI 10/08/2020   Ureterolithiasis    Gross hematuria 06/16/2020   Renal lithiasis 06/07/2020   Renal stone 06/06/2020   Type 2 diabetes mellitus with stage 3b chronic kidney disease, with long-term current use of insulin ([place]) 05/26/2020   Visual disturbance 03/30/2020   Chronic pain of left knee 03/30/2020   Snores 03/30/2020   Chronic kidney disease, stage 3b ([place]) 03/30/2020   Midline low back pain without sciatica 03/30/2020   Hospital discharge follow-up 02/25/2020   Type 2 diabetes mellitus with diabetic polyneuropathy, with long-term current use of insulin ([place]) 02/23/2020   Syncope 02/08/2020   Renal stones 01/30/2020   Diminished pulses in lower extremity 01/26/2020   AKI (acute kidney injury) ([place]) 01/26/2020   CKD (chronic kidney disease) stage 4, GFR 15-29 ml/min (HCC) 01/05/2020   Hyperkalemia 01/05/2020   Long term (current) use of anticoagulants 12/22/2019   Elevated cholesterol 12/17/2019   Primary osteoarthritis of left knee 12/17/2019   Arthritis 12/17/2019   Type 2 diabetes mellitus with hyperglycemia, with long-term current use of insulin ([place]) 12/17/2019   History of pulmonary embolism 12/17/2019   Essential hypertension 12/17/2019    Hilda Blades, PT, DPT, LAT, ATC 10/18/21  3:04 PM Phone: [phone] Fax: [address]  [place], [place], [address] Phone: [phone]   Fax:  [phone]  Name: Tahir Blank MRN: [id number] Date of Birth: 09-02-1956

## 2021-10-22 ENCOUNTER — Ambulatory Visit: Payer: Medicare Other | Admitting: Physical Therapy

## 2021-10-22 ENCOUNTER — Encounter: Payer: Self-pay | Admitting: Physical Therapy

## 2021-10-22 ENCOUNTER — Other Ambulatory Visit: Payer: Self-pay

## 2021-10-22 DIAGNOSIS — R2689 Other abnormalities of gait and mobility: Secondary | ICD-10-CM

## 2021-10-22 DIAGNOSIS — M6281 Muscle weakness (generalized): Secondary | ICD-10-CM

## 2021-10-22 NOTE — Therapy (Signed)
[place], [place], [address] Phone: [phone]   Fax:  [phone]  Physical Therapy Treatment / ERO  Patient Details  Name: [name] MRN: [id number] Date of Birth: 1956-07-17 Referring Provider (PT): Leandrew Koyanagi, MD   Encounter Date: 10/22/2021   PT End of Session - 10/22/21 0920     Visit Number 7    Number of Visits 19    Date for PT Re-Evaluation 12/03/21    Authorization Type MCR/MCD    Progress Note Due on Visit 10    PT Start Time 0915    PT Stop Time 1000   5 min goal assessement and FOTO   PT Time Calculation (min) 45 min    Activity Tolerance Patient tolerated treatment well    Behavior During Therapy [facility] for tasks assessed/performed             Past Medical History:  Diagnosis Date   Arthritis    Chronic kidney disease    reduced kidney function. Stage III   Diabetes mellitus without complication (HCC)    DVT (deep venous thrombosis) [facility])    age indeterminate LLE DVT 02/09/20   Dyspnea    On exertion   History of kidney stones    History of pulmonary embolus (PE)    Hypertension    Lipidemia    Prostate enlargement    Pulmonary embolism ([place]) 2009   Sleep apnea     Past Surgical History:  Procedure Laterality Date   CYSTOSCOPY W/ URETERAL STENT PLACEMENT Left 10/08/2020   Procedure: CYSTOSCOPY WITH RETROGRADE PYELOGRAM/URETERAL STENT PLACEMENT;  Surgeon: Irine Seal, MD;  Location: [place];  Service: Urology;  Laterality: Left;   CYSTOSCOPY/URETEROSCOPY/HOLMIUM LASER/STENT PLACEMENT Right 06/07/2020   Procedure: CYSTOSCOPY/URETEROSCOPY/HOLMIUM LASER/STENT PLACEMENT;  Surgeon: Lucas Mallow, MD;  [facility];  Service: Urology;  Laterality: Right;   CYSTOSCOPY/URETEROSCOPY/HOLMIUM LASER/STENT PLACEMENT Left 10/19/2020   Procedure: CYSTOSCOPY LEFT URETEROSCOPY/HOLMIUM LASER/STENT EXCHANGE;  Surgeon: Irine Seal, MD;  [facility];  Service: Urology;   Laterality: Left;   TOTAL KNEE ARTHROPLASTY Left 08/06/2021   Procedure: LEFT TOTAL KNEE ARTHROPLASTY;  Surgeon: Leandrew Koyanagi, MD;  Location: [place];  Service: Orthopedics;  Laterality: Left;    There were no vitals filed for this visit.   Subjective Assessment - 10/22/21 0925     Subjective Patient reports minimal pain at rest but persistent pain when walking or standing extended periods. Pain mainly located to lateral aspect of left knee. Also notes right knee and low back pain.    Patient Stated Goals I would like to get back to normal gait; ideally not use the cane.    Currently in Pain? Yes    Pain Score 3     Pain Location Knee    Pain Orientation Left    Pain Descriptors / Indicators Aching    Pain Type Chronic pain    Pain Onset More than a month ago    Pain Frequency Intermittent    Aggravating Factors  walking extended periods, standing, going down stairs                [facility] PT Assessment - 10/22/21 0001       Assessment   Medical Diagnosis L TKR    Referring Provider (PT) Leandrew Koyanagi, MD    Onset Date/Surgical Date 08/06/21      Precautions   Precautions None      Restrictions   Weight  Bearing Restrictions No      Balance Screen   Has the patient fallen in the past 6 months No      Prior Function   Level of Independence Independent      Observation/Other Assessments   Focus on Therapeutic Outcomes (FOTO)  52% functional status      AROM   Left Knee Extension 8    Left Knee Flexion 118      Strength   Strength Assessment Site Knee    Right/Left Knee Left    Left Knee Flexion 4/5    Left Knee Extension 4/5      Ambulation/Gait   Ambulation/Gait Yes    Ambulation/Gait Assistance 6: Modified independent (Device/Increase time)    Assistive device Straight cane    Gait Comments Continues to require cueing for knee flexion/extension throughout gait cycle               [facility] Adult PT Treatment/Exercise:   Therapeutic Exercise: NuStep  L5 x 5 min with UE/LE while taking subejct Heel prop with 6# over knee and MHP applied to posterior knee x 5 min Seated hamstring stretch with manual pressure over knee into extension 3 x 30 sec Slant board calf stretch 3 x 30 sec Supine quad set with heel prop 10 x 5 sec Standing TKE with black 10 x 5 sec  Knee extension machine 20# 2 x 15, SL with 10# x 10 TRX squat 2 x 10   Manual Therapy: Seated and supine tibfem mobs AP/PA combine with rotational mobs to improve knee flexion and extension Supine PF mobs sup/inf to improve knee flexion and extension Seated knee flexion PROM with tibial IR 5 x 10 sec Supine knee flexion and extension PROM           PT Education - 10/22/21 0937     Education Details POC update, HEP, LLLD stretching for knee extension    Person(s) Educated Patient    Methods Explanation;Demonstration;Verbal cues    Comprehension Verbalized understanding;Returned demonstration;Need further instruction;Verbal cues required              PT Short Term Goals - 10/18/21 1503       PT SHORT TERM GOAL #1   Title Pt will be indep with HEP to assist with improved functional mobility    Baseline is independent  usually every other day    Time 3    Period Weeks    Status Achieved    Target Date 10/02/21      PT SHORT TERM GOAL #2   Title Pt will demo improved L knee extension to lacking </= -10 degrees measured supine to assist with gait    Baseline -8    Time 3    Period Weeks    Status Achieved    Target Date 10/02/21      PT SHORT TERM GOAL #3   Title pt will demo improved L knee flexion to >/=108 degrees to assist with normalized gait    Baseline 118    Time 3    Period Weeks    Status Achieved    Target Date 10/02/21               PT Long Term Goals - 10/22/21 0940       PT LONG TERM GOAL #1   Title Pt will be able to ambulate x 45 min without a cane with </=2/10 L knee pain    Baseline patient continues to ambulate  using SPC and  reports pain 3-4/10 with walking extended periods    Time 6    Period Weeks    Status On-going    Target Date 12/03/21      PT LONG TERM GOAL #2   Title Pt will be able to stand x 1.5 hours to perform household activities with </=3/10 L knee pain    Baseline patient reports increased pain with standing extended periods    Time 6    Period Weeks    Status On-going    Target Date 12/03/21      PT LONG TERM GOAL #3   Title pt will be able to walk up/down stairs reciprocally at home with good control with pain </=3/10 L knee    Baseline patient continues to report difficulty and increased pain with decending stairs    Time 6    Period Weeks    Status On-going    Target Date 12/03/21      PT LONG TERM GOAL #4   Title Pt will be able to demo improved gait with heel strike and knee flex/ext L at approp phases of gait x 75% of the time with least restrictive assistive device.    Baseline patient continues to require cueing for knee flexion/extension with gait    Time 6    Period Weeks    Status On-going    Target Date 12/03/21      PT LONG TERM GOAL #5   Title pt will demo improved foto score to 58% function    Baseline 52% functional status    Time 6    Period Weeks    Status On-going    Target Date 12/03/21                   Plan - 10/22/21 2094     Clinical Impression Statement Patient tolerated therapy well with no adverse effects. He continues to demonstrate progression toward goals, exhibitng improve motion, strength, and overall function on FOTO. He does continues to demonstrate gross knee motion and strength limitations resulting in gait deviations, difficulty with stair negotiation, and increase in pain with extended periods of walking and standing. Overall patient is making good progress with therapy and would benefit from continued skilled PT to progress his mobility and strength in order to reduce pain and maximize functional ability.    PT Frequency 2x / week     PT Duration 6 weeks    PT Treatment/Interventions ADLs/Self Care Home Management;Cryotherapy;DME Instruction;Gait training;Stair training;Functional mobility training;Neuromuscular re-education;Balance training;Therapeutic exercise;Therapeutic activities;Patient/family education;Manual techniques;Passive range of motion;Dry needling;Taping    PT Next Visit Plan Review HEP and progress PRN, manual/stretching for knee motion, progress strength, balance training    PT Home Exercise Plan Access Code: [email or web address]    Consulted and Agree with Plan of Care Patient             Patient will benefit from skilled therapeutic intervention in order to improve the following deficits and impairments:  Abnormal gait, Decreased endurance, Decreased mobility, Difficulty walking, Hypomobility, Increased edema, Decreased range of motion, Decreased activity tolerance, Decreased strength, Impaired flexibility, Pain  Visit Diagnosis: Other abnormalities of gait and mobility  Muscle weakness (generalized)     Problem List Patient Active Problem List   Diagnosis Date Noted   Status post total left knee replacement 08/06/2021   Encounter for medication monitoring 07/10/2021   UTI (urinary tract infection) 10/09/2020   Acute UTI 10/08/2020   Ureterolithiasis  Gross hematuria 06/16/2020   Renal lithiasis 06/07/2020   Renal stone 06/06/2020   Type 2 diabetes mellitus with stage 3b chronic kidney disease, with long-term current use of insulin ([place]) 05/26/2020   Visual disturbance 03/30/2020   Chronic pain of left knee 03/30/2020   Snores 03/30/2020   Chronic kidney disease, stage 3b ([place]) 03/30/2020   Midline low back pain without sciatica 03/30/2020   Hospital discharge follow-up 02/25/2020   Type 2 diabetes mellitus with diabetic polyneuropathy, with long-term current use of insulin ([place]) 02/23/2020   Syncope 02/08/2020   Renal stones 01/30/2020   Diminished pulses in lower extremity 01/26/2020    AKI (acute kidney injury) ([place]) 01/26/2020   CKD (chronic kidney disease) stage 4, GFR 15-29 ml/min (HCC) 01/05/2020   Hyperkalemia 01/05/2020   Long term (current) use of anticoagulants 12/22/2019   Elevated cholesterol 12/17/2019   Primary osteoarthritis of left knee 12/17/2019   Arthritis 12/17/2019   Type 2 diabetes mellitus with hyperglycemia, with long-term current use of insulin ([place]) 12/17/2019   History of pulmonary embolism 12/17/2019   Essential hypertension 12/17/2019    Hilda Blades, PT, DPT, LAT, ATC 10/22/21  10:19 AM Phone: [phone] Fax: [place] [employer] [address] [place], [place], [address] Phone: [phone]   Fax:  [phone]  Name: [name] MRN: [id number] Date of Birth: 1956-02-10

## 2021-10-25 ENCOUNTER — Other Ambulatory Visit: Payer: Self-pay

## 2021-10-25 ENCOUNTER — Ambulatory Visit: Payer: Medicare Other | Admitting: Physical Therapy

## 2021-10-25 ENCOUNTER — Encounter: Payer: Self-pay | Admitting: Physical Therapy

## 2021-10-25 DIAGNOSIS — R2689 Other abnormalities of gait and mobility: Secondary | ICD-10-CM

## 2021-10-25 DIAGNOSIS — M6281 Muscle weakness (generalized): Secondary | ICD-10-CM

## 2021-10-25 NOTE — Therapy (Signed)
[place], [place], [address] Phone: [phone]   Fax:  [phone]  Physical Therapy Treatment  Patient Details  Name: [name] MRN: [id number] Date of Birth: [birth year] Referring Provider (PT): Leandrew Koyanagi, MD   Encounter Date: 10/25/2021   PT End of Session - 10/25/21 1204     Visit Number 8    Number of Visits 19    Date for PT Re-Evaluation 12/03/21    Authorization Type MCR/MCD    Progress Note Due on Visit 10    PT Start Time 1215    PT Stop Time 1300    PT Time Calculation (min) 45 min    Activity Tolerance Patient tolerated treatment well    Behavior During Therapy [facility] for tasks assessed/performed             Past Medical History:  Diagnosis Date   Arthritis    Chronic kidney disease    reduced kidney function. Stage III   Diabetes mellitus without complication (HCC)    DVT (deep venous thrombosis) [facility])    age indeterminate LLE DVT 02/09/20   Dyspnea    On exertion   History of kidney stones    History of pulmonary embolus (PE)    Hypertension    Lipidemia    Prostate enlargement    Pulmonary embolism ([place]) 2009   Sleep apnea     Past Surgical History:  Procedure Laterality Date   CYSTOSCOPY W/ URETERAL STENT PLACEMENT Left 10/08/2020   Procedure: CYSTOSCOPY WITH RETROGRADE PYELOGRAM/URETERAL STENT PLACEMENT;  Surgeon: Irine Seal, MD;  Location: [place];  Service: Urology;  Laterality: Left;   CYSTOSCOPY/URETEROSCOPY/HOLMIUM LASER/STENT PLACEMENT Right 06/07/2020   Procedure: CYSTOSCOPY/URETEROSCOPY/HOLMIUM LASER/STENT PLACEMENT;  Surgeon: Lucas Mallow, MD;  [facility];  Service: Urology;  Laterality: Right;   CYSTOSCOPY/URETEROSCOPY/HOLMIUM LASER/STENT PLACEMENT Left 10/19/2020   Procedure: CYSTOSCOPY LEFT URETEROSCOPY/HOLMIUM LASER/STENT EXCHANGE;  Surgeon: Irine Seal, MD;  [facility];  Service: Urology;  Laterality: Left;   TOTAL KNEE ARTHROPLASTY Left  08/06/2021   Procedure: LEFT TOTAL KNEE ARTHROPLASTY;  Surgeon: Leandrew Koyanagi, MD;  Location: [place];  Service: Orthopedics;  Laterality: Left;    There were no vitals filed for this visit.   Subjective Assessment - 10/25/21 1205     Subjective Patient reports he has the usual stiffness, no other new issues. Continues to do exercises at home.    Patient Stated Goals I would like to get back to normal gait; ideally not use the cane.    Currently in Pain? Yes    Pain Score 2     Pain Location Knee    Pain Orientation Left    Pain Descriptors / Indicators Aching;Tightness    Pain Type Chronic pain;Surgical pain    Pain Onset More than a month ago    Pain Frequency Intermittent    Aggravating Factors  walking extended periods, standing, going down stairs                 [facility] Adult PT Treatment/Exercise:   Therapeutic Exercise: NuStep L5 x 5 min with UE/LE while taking subejct Heel prop with 5# over knee and MHP applied to posterior knee x 5 min Seated hamstring stretch with manual pressure over knee into extension 3 x 30 sec Slant board calf stretch 3 x 30 sec Supine quad set with heel prop 10 x 5 sec Knee extension machine SL with 10# 2 x 10 (right 15#) Leg  press (omega) SL 25# 2 x 10 each   Manual Therapy: Seated and supine tibfem mobs AP/PA combine with rotational mobs to improve knee flexion and extension Seated knee flexion PROM with tibial IR 5 x 10 sec Supine PF mobs sup/inf to improve knee flexion and extension Supine knee flexion and extension PROM           PT Education - 10/25/21 1206     Education Details HEP    Person(s) Educated Patient    Methods Explanation;Demonstration;Verbal cues    Comprehension Verbalized understanding;Returned demonstration;Verbal cues required              PT Short Term Goals - 10/18/21 1503       PT SHORT TERM GOAL #1   Title Pt will be indep with HEP to assist with improved functional mobility    Baseline is  independent  usually every other day    Time 3    Period Weeks    Status Achieved    Target Date 10/02/21      PT SHORT TERM GOAL #2   Title Pt will demo improved L knee extension to lacking </= -10 degrees measured supine to assist with gait    Baseline -8    Time 3    Period Weeks    Status Achieved    Target Date 10/02/21      PT SHORT TERM GOAL #3   Title pt will demo improved L knee flexion to >/=108 degrees to assist with normalized gait    Baseline 118    Time 3    Period Weeks    Status Achieved    Target Date 10/02/21               PT Long Term Goals - 10/22/21 0940       PT LONG TERM GOAL #1   Title Pt will be able to ambulate x 45 min without a cane with </=2/10 L knee pain    Baseline patient continues to ambulate using SPC and reports pain 3-4/10 with walking extended periods    Time 6    Period Weeks    Status On-going    Target Date 12/03/21      PT LONG TERM GOAL #2   Title Pt will be able to stand x 1.5 hours to perform household activities with </=3/10 L knee pain    Baseline patient reports increased pain with standing extended periods    Time 6    Period Weeks    Status On-going    Target Date 12/03/21      PT LONG TERM GOAL #3   Title pt will be able to walk up/down stairs reciprocally at home with good control with pain </=3/10 L knee    Baseline patient continues to report difficulty and increased pain with decending stairs    Time 6    Period Weeks    Status On-going    Target Date 12/03/21      PT LONG TERM GOAL #4   Title Pt will be able to demo improved gait with heel strike and knee flex/ext L at approp phases of gait x 75% of the time with least restrictive assistive device.    Baseline patient continues to require cueing for knee flexion/extension with gait    Time 6    Period Weeks    Status On-going    Target Date 12/03/21      PT LONG TERM GOAL #5  Title pt will demo improved foto score to 58% function    Baseline 52%  functional status    Time 6    Period Weeks    Status On-going    Target Date 12/03/21                   Plan - 10/25/21 1207     Clinical Impression Statement Patient tolerated therapy well with no adverse effects. He demonstrates continued improvement in knee motion. Therapy continues to focus on progressing knee motion and strength. He seems to be progressing well with his strengthening, able to perform machine based strengthening this visit without any increase in pain. No changes to HEP, patient encouraged to work on knee extension stretching at home. Patient would benefit from continued skilled PT to progress his mobility and strength in order to reduce pain and maximize functional ability.    PT Treatment/Interventions ADLs/Self Care Home Management;Cryotherapy;DME Instruction;Gait training;Stair training;Functional mobility training;Neuromuscular re-education;Balance training;Therapeutic exercise;Therapeutic activities;Patient/family education;Manual techniques;Passive range of motion;Dry needling;Taping    PT Next Visit Plan Review HEP and progress PRN, manual/stretching for knee motion, progress strength, balance training    PT Home Exercise Plan Access Code: [email or web address]    Consulted and Agree with Plan of Care Patient             Patient will benefit from skilled therapeutic intervention in order to improve the following deficits and impairments:  Abnormal gait, Decreased endurance, Decreased mobility, Difficulty walking, Hypomobility, Increased edema, Decreased range of motion, Decreased activity tolerance, Decreased strength, Impaired flexibility, Pain  Visit Diagnosis: Other abnormalities of gait and mobility  Muscle weakness (generalized)     Problem List Patient Active Problem List   Diagnosis Date Noted   Status post total left knee replacement 08/06/2021   Encounter for medication monitoring 07/10/2021   UTI (urinary tract infection) 10/09/2020   Acute  UTI 10/08/2020   Ureterolithiasis    Gross hematuria 06/16/2020   Renal lithiasis 06/07/2020   Renal stone 06/06/2020   Type 2 diabetes mellitus with stage 3b chronic kidney disease, with long-term current use of insulin ([place]) 05/26/2020   Visual disturbance 03/30/2020   Chronic pain of left knee 03/30/2020   Snores 03/30/2020   Chronic kidney disease, stage 3b ([place]) 03/30/2020   Midline low back pain without sciatica 03/30/2020   Hospital discharge follow-up 02/25/2020   Type 2 diabetes mellitus with diabetic polyneuropathy, with long-term current use of insulin ([place]) 02/23/2020   Syncope 02/08/2020   Renal stones 01/30/2020   Diminished pulses in lower extremity 01/26/2020   AKI (acute kidney injury) ([place]) 01/26/2020   CKD (chronic kidney disease) stage 4, GFR 15-29 ml/min (HCC) 01/05/2020   Hyperkalemia 01/05/2020   Long term (current) use of anticoagulants 12/22/2019   Elevated cholesterol 12/17/2019   Primary osteoarthritis of left knee 12/17/2019   Arthritis 12/17/2019   Type 2 diabetes mellitus with hyperglycemia, with long-term current use of insulin ([place]) 12/17/2019   History of pulmonary embolism 12/17/2019   Essential hypertension 12/17/2019    Hilda Blades, PT, DPT, LAT, ATC 10/25/21  1:14 PM Phone: [phone] Fax: [place] [facility] [address] [place], [place], [address] Phone: [phone]   Fax:  [phone]  Name: [name] MRN: [id number] Date of Birth: [birth year]

## 2021-10-30 ENCOUNTER — Ambulatory Visit: Payer: Medicare Other | Admitting: Physical Therapy

## 2021-10-30 ENCOUNTER — Ambulatory Visit: Payer: Medicare Other | Admitting: Orthopaedic Surgery

## 2021-10-31 ENCOUNTER — Encounter: Payer: Self-pay | Admitting: Orthopaedic Surgery

## 2021-10-31 ENCOUNTER — Ambulatory Visit (INDEPENDENT_AMBULATORY_CARE_PROVIDER_SITE_OTHER): Payer: Medicare Other | Admitting: Physician Assistant

## 2021-10-31 ENCOUNTER — Other Ambulatory Visit: Payer: Self-pay

## 2021-10-31 DIAGNOSIS — Z96652 Presence of left artificial knee joint: Secondary | ICD-10-CM

## 2021-10-31 NOTE — Progress Notes (Signed)
Post-Op Visit Note   Patient: [name]           Date of Birth: 06/18/1956           MRN: [id number] Visit Date: 10/31/2021 PCP: [name], MD   Assessment & Plan:  Chief Complaint:  Chief Complaint  Patient presents with   Left Knee - Follow-up   Visit Diagnoses:  1. Hx of total knee replacement, left     Plan: Patient is a pleasant 65 year old gentleman who comes in today nearly 3 months status post left total knee replacement 08/06/2021.  He has been doing well.  He has minimal discomfort which only occurs with lifting his foot.  He is still in physical therapy where he continues to make slow but steady progress.  He is ambulating with a cane.  Examination of the left knee reveals range of motion from approximately 10 to 115 degrees.  He is stable valgus varus stress.  He is neurovascular intact distally.  At this point, he will continue with formal physical therapy in addition to a home exercise program.  Dental prophylaxis reinforced.  He will follow-up with [place] in 3 months time for repeat evaluation and 2 view x-rays of the left knee.  Call with concerns or questions in the meantime.  Follow-Up Instructions: Return in about 3 months (around 01/29/2022).   Orders:  No orders of the defined types were placed in this encounter.  No orders of the defined types were placed in this encounter.   Imaging: No new imaging  PMFS History: Patient Active Problem List   Diagnosis Date Noted   Status post total left knee replacement 08/06/2021   Encounter for medication monitoring 07/10/2021   UTI (urinary tract infection) 10/09/2020   Acute UTI 10/08/2020   Ureterolithiasis    Gross hematuria 06/16/2020   Renal lithiasis 06/07/2020   Renal stone 06/06/2020   Type 2 diabetes mellitus with stage 3b chronic kidney disease, with long-term current use of insulin ([place]) 05/26/2020   Visual disturbance 03/30/2020   Chronic pain of left knee 03/30/2020   Snores 03/30/2020    Chronic kidney disease, stage 3b ([place]) 03/30/2020   Midline low back pain without sciatica 03/30/2020   Hospital discharge follow-up 02/25/2020   Type 2 diabetes mellitus with diabetic polyneuropathy, with long-term current use of insulin ([place]) 02/23/2020   Syncope 02/08/2020   Renal stones 01/30/2020   Diminished pulses in lower extremity 01/26/2020   AKI (acute kidney injury) ([place]) 01/26/2020   CKD (chronic kidney disease) stage 4, GFR 15-29 ml/min (HCC) 01/05/2020   Hyperkalemia 01/05/2020   Long term (current) use of anticoagulants 12/22/2019   Elevated cholesterol 12/17/2019   Primary osteoarthritis of left knee 12/17/2019   Arthritis 12/17/2019   Type 2 diabetes mellitus with hyperglycemia, with long-term current use of insulin ([place]) 12/17/2019   History of pulmonary embolism 12/17/2019   Essential hypertension 12/17/2019   Past Medical History:  Diagnosis Date   Arthritis    Chronic kidney disease    reduced kidney function. Stage III   Diabetes mellitus without complication (HCC)    DVT (deep venous thrombosis) [facility])    age indeterminate LLE DVT 02/09/20   Dyspnea    On exertion   History of kidney stones    History of pulmonary embolus (PE)    Hypertension    Lipidemia    Prostate enlargement    Pulmonary embolism ([place]) 2009   Sleep apnea     Family History  Problem Relation Age of Onset   Cancer Mother    Heart disease Father     Past Surgical History:  Procedure Laterality Date   CYSTOSCOPY W/ URETERAL STENT PLACEMENT Left 10/08/2020   Procedure: CYSTOSCOPY WITH RETROGRADE PYELOGRAM/URETERAL STENT PLACEMENT;  Surgeon: Irine Seal, MD;  Location: [place];  Service: Urology;  Laterality: Left;   CYSTOSCOPY/URETEROSCOPY/HOLMIUM LASER/STENT PLACEMENT Right 06/07/2020   Procedure: CYSTOSCOPY/URETEROSCOPY/HOLMIUM LASER/STENT PLACEMENT;  Surgeon: Lucas Mallow, MD;  [facility];  Service: Urology;  Laterality: Right;   CYSTOSCOPY/URETEROSCOPY/HOLMIUM  LASER/STENT PLACEMENT Left 10/19/2020   Procedure: CYSTOSCOPY LEFT URETEROSCOPY/HOLMIUM LASER/STENT EXCHANGE;  Surgeon: Irine Seal, MD;  [facility];  Service: Urology;  Laterality: Left;   TOTAL KNEE ARTHROPLASTY Left 08/06/2021   Procedure: LEFT TOTAL KNEE ARTHROPLASTY;  Surgeon: Leandrew Koyanagi, MD;  Location: [place];  Service: Orthopedics;  Laterality: Left;   Social History   Occupational History   Occupation: retired  Tobacco Use   Smoking status: Never   Smokeless tobacco: Never  Vaping Use   Vaping Use: Never used  Substance and Sexual Activity   Alcohol use: Yes    Alcohol/week: 1.0 standard drink    Types: 1 Cans of beer per week    Comment: social   Drug use: Never   Sexual activity: Yes

## 2021-11-01 ENCOUNTER — Ambulatory Visit: Payer: Medicare Other | Admitting: Physical Therapy

## 2021-11-01 DIAGNOSIS — R2689 Other abnormalities of gait and mobility: Secondary | ICD-10-CM

## 2021-11-01 DIAGNOSIS — M6281 Muscle weakness (generalized): Secondary | ICD-10-CM

## 2021-11-01 NOTE — Therapy (Signed)
[place], [place], [address] Phone: [phone]   Fax:  [phone]  Physical Therapy Treatment  Patient Details  Name: [name] MRN: [id number] Date of Birth: [birth year] Referring Provider (PT): Leandrew Koyanagi, MD   Encounter Date: 11/01/2021   PT End of Session - 11/01/21 1203     Visit Number 9    Number of Visits 19    Date for PT Re-Evaluation 12/03/21    Authorization Type MCR/MCD    Progress Note Due on Visit 10    PT Start Time 1110    PT Stop Time 1204    PT Time Calculation (min) 54 min    Activity Tolerance Patient tolerated treatment well    Behavior During Therapy [facility] for tasks assessed/performed             Past Medical History:  Diagnosis Date   Arthritis    Chronic kidney disease    reduced kidney function. Stage III   Diabetes mellitus without complication (HCC)    DVT (deep venous thrombosis) [facility])    age indeterminate LLE DVT 02/09/20   Dyspnea    On exertion   History of kidney stones    History of pulmonary embolus (PE)    Hypertension    Lipidemia    Prostate enlargement    Pulmonary embolism ([place]) 2009   Sleep apnea     Past Surgical History:  Procedure Laterality Date   CYSTOSCOPY W/ URETERAL STENT PLACEMENT Left 10/08/2020   Procedure: CYSTOSCOPY WITH RETROGRADE PYELOGRAM/URETERAL STENT PLACEMENT;  Surgeon: Irine Seal, MD;  Location: [place];  Service: Urology;  Laterality: Left;   CYSTOSCOPY/URETEROSCOPY/HOLMIUM LASER/STENT PLACEMENT Right 06/07/2020   Procedure: CYSTOSCOPY/URETEROSCOPY/HOLMIUM LASER/STENT PLACEMENT;  Surgeon: Lucas Mallow, MD;  [facility];  Service: Urology;  Laterality: Right;   CYSTOSCOPY/URETEROSCOPY/HOLMIUM LASER/STENT PLACEMENT Left 10/19/2020   Procedure: CYSTOSCOPY LEFT URETEROSCOPY/HOLMIUM LASER/STENT EXCHANGE;  Surgeon: Irine Seal, MD;  [facility];  Service: Urology;  Laterality: Left;   TOTAL KNEE ARTHROPLASTY Left  08/06/2021   Procedure: LEFT TOTAL KNEE ARTHROPLASTY;  Surgeon: Leandrew Koyanagi, MD;  Location: [place];  Service: Orthopedics;  Laterality: Left;    There were no vitals filed for this visit.   Subjective Assessment - 11/01/21 1208     Subjective I have about a 1-2/10  just when I barely bend my knee when I am walking  It feels like a catch    Pertinent History L TKR 08/06/21    Patient Stated Goals I would like to get back to normal gait; ideally not use the cane.    Pain Score 2     Pain Location Knee    Pain Orientation Left    Pain Descriptors / Indicators Aching;Tightness            [facility] Adult PT Treatment/Exercise:   Therapeutic Exercise: NuStep L5 x 5 min with UE/LE while taking subejct Step down 4 inch step with PT R handhold assist 2 x 10 Left LE on step, heel tap on R Wall slide 2 x 10 with 10 sec hold Heel prop  Supine quad set with heel prop 10 x 5 sec and  also pressure applied by PT for added stretch. MHP applied to posterior knee x 5 min  While heel propping encouraging quad setting 5 sec hold  45 sec hold left hamstring x 4 Seated hamstring stretch with manual pressure over knee into extension 3 x 30 sec Slant  board calf stretch 3 x 30 sec Knee extension machine SL with 10# 1 x 10  and then 15 # 1 x 10  (right 20#) Leg press (omega) SL 25# 1 x 10 each  1 x 10 35#   Manual Therapy: Seated and supine tibfem mobs AP/PA combine with rotational mobs to improve knee flexion and extension Seated knee flexion PROM with tibial IR 5 x 10 sec Supine PF mobs sup/inf to improve knee flexion and extension Supine knee flexion and extension PROM  Self care Educated on self patellar mob  inf/sup with return demo with PT  pt reports anterior knee pain improves with mobs or patellar.    [facility] PT Assessment - 11/01/21 0001       AROM   Left Knee Extension 7    Left Knee Flexion 120                                    PT Education - 11/01/21 1207      Education Details Pt educated and return demo of self patellar mobs to decrease pain in ant lateral of patella.    Person(s) Educated Patient    Methods Explanation;Demonstration;Tactile cues;Verbal cues;Handout    Comprehension Verbalized understanding;Returned demonstration              PT Short Term Goals - 10/18/21 1503       PT SHORT TERM GOAL #1   Title Pt will be indep with HEP to assist with improved functional mobility    Baseline is independent  usually every other day    Time 3    Period Weeks    Status Achieved    Target Date 10/02/21      PT SHORT TERM GOAL #2   Title Pt will demo improved L knee extension to lacking </= -10 degrees measured supine to assist with gait    Baseline -8    Time 3    Period Weeks    Status Achieved    Target Date 10/02/21      PT SHORT TERM GOAL #3   Title pt will demo improved L knee flexion to >/=108 degrees to assist with normalized gait    Baseline 118    Time 3    Period Weeks    Status Achieved    Target Date 10/02/21               PT Long Term Goals - 10/22/21 0940       PT LONG TERM GOAL #1   Title Pt will be able to ambulate x 45 min without a cane with </=2/10 L knee pain    Baseline patient continues to ambulate using SPC and reports pain 3-4/10 with walking extended periods    Time 6    Period Weeks    Status On-going    Target Date 12/03/21      PT LONG TERM GOAL #2   Title Pt will be able to stand x 1.5 hours to perform household activities with </=3/10 L knee pain    Baseline patient reports increased pain with standing extended periods    Time 6    Period Weeks    Status On-going    Target Date 12/03/21      PT LONG TERM GOAL #3   Title pt will be able to walk up/down stairs reciprocally at home with good control with pain </=  3/10 L knee    Baseline patient continues to report difficulty and increased pain with decending stairs    Time 6    Period Weeks    Status On-going    Target  Date 12/03/21      PT LONG TERM GOAL #4   Title Pt will be able to demo improved gait with heel strike and knee flex/ext L at approp phases of gait x 75% of the time with least restrictive assistive device.    Baseline patient continues to require cueing for knee flexion/extension with gait    Time 6    Period Weeks    Status On-going    Target Date 12/03/21      PT LONG TERM GOAL #5   Title pt will demo improved foto score to 58% function    Baseline 52% functional status    Time 6    Period Weeks    Status On-going    Target Date 12/03/21                   Plan - 11/01/21 1209     Clinical Impression Statement Pt tolerated therapy well with no adverse effects.  He continues to increase AROM   L flex 8 to ext 120.  Pt has a " catch " in the left ant lateral portion of knee under the patella. He seems to do better with patellar mobs and was educated with self mobs for home.  Pt increasing weights with gym machines knee ext and leg press  15 # ext. 35# leg press.  PT noting decreased antalgic gait post Rx session.  Pt pain minimal at 1-2/10  .  Pt with 0/10 at end of session    Personal Factors and Comorbidities Comorbidity 1;Comorbidity 2;Comorbidity 3+    Comorbidities Hx of PE, LBP, syncope hx, DM 2, dyspnea on exertion    Examination-Activity Limitations Squat;Stairs;Locomotion Level;Other    Examination-Participation Restrictions Cleaning;Shop;Community Activity    PT Frequency 2x / week    PT Duration 6 weeks    PT Treatment/Interventions ADLs/Self Care Home Management;Cryotherapy;DME Instruction;Gait training;Stair training;Functional mobility training;Neuromuscular re-education;Balance training;Therapeutic exercise;Therapeutic activities;Patient/family education;Manual techniques;Passive range of motion;Dry needling;Taping    PT Next Visit Plan Progress note 10th visit.  possible TPDN nextr visit  progress PRN, manual/stretching for knee motion, progress strength,  balance training    PT Home Exercise Plan Access Code: [email or web address]    Consulted and Agree with Plan of Care Patient             Patient will benefit from skilled therapeutic intervention in order to improve the following deficits and impairments:  Abnormal gait, Decreased endurance, Decreased mobility, Difficulty walking, Hypomobility, Increased edema, Decreased range of motion, Decreased activity tolerance, Decreased strength, Impaired flexibility, Pain  Visit Diagnosis: Other abnormalities of gait and mobility  Muscle weakness (generalized)     Problem List Patient Active Problem List   Diagnosis Date Noted   Status post total left knee replacement 08/06/2021   Encounter for medication monitoring 07/10/2021   UTI (urinary tract infection) 10/09/2020   Acute UTI 10/08/2020   Ureterolithiasis    Gross hematuria 06/16/2020   Renal lithiasis 06/07/2020   Renal stone 06/06/2020   Type 2 diabetes mellitus with stage 3b chronic kidney disease, with long-term current use of insulin ([place]) 05/26/2020   Visual disturbance 03/30/2020   Chronic pain of left knee 03/30/2020   Snores 03/30/2020   Chronic kidney disease, stage 3b ([place]) 03/30/2020  Midline low back pain without sciatica 03/30/2020   Hospital discharge follow-up 02/25/2020   Type 2 diabetes mellitus with diabetic polyneuropathy, with long-term current use of insulin ([place]) 02/23/2020   Syncope 02/08/2020   Renal stones 01/30/2020   Diminished pulses in lower extremity 01/26/2020   AKI (acute kidney injury) ([place]) 01/26/2020   CKD (chronic kidney disease) stage 4, GFR 15-29 ml/min (HCC) 01/05/2020   Hyperkalemia 01/05/2020   Long term (current) use of anticoagulants 12/22/2019   Elevated cholesterol 12/17/2019   Primary osteoarthritis of left knee 12/17/2019   Arthritis 12/17/2019   Type 2 diabetes mellitus with hyperglycemia, with long-term current use of insulin ([place]) 12/17/2019   History of pulmonary embolism  12/17/2019   Essential hypertension 12/17/2019   Voncille Lo, PT, [place] Certified Exercise Expert for the Aging Adult  11/01/21 12:12 PM Phone: [phone] Fax: [place] [facility] [address] [place], [place], [address] Phone: [phone]   Fax:  [phone]  Name: [name] MRN: [id number] Date of Birth: 04-Oct-1956

## 2021-11-06 ENCOUNTER — Ambulatory Visit: Payer: Medicare Other | Attending: Orthopaedic Surgery | Admitting: Physical Therapy

## 2021-11-06 ENCOUNTER — Other Ambulatory Visit: Payer: Self-pay

## 2021-11-06 DIAGNOSIS — M6281 Muscle weakness (generalized): Secondary | ICD-10-CM | POA: Insufficient documentation

## 2021-11-06 DIAGNOSIS — R2689 Other abnormalities of gait and mobility: Secondary | ICD-10-CM | POA: Insufficient documentation

## 2021-11-06 NOTE — Therapy (Signed)
[place], [place], [address] Phone: [phone]   Fax:  [phone]  Physical Therapy Treatment/Progress Note  Patient Details  Name: [name] MRN: [id number] Date of Birth: January 06, 1956 Referring Provider (PT): Leandrew Koyanagi, MD   Encounter Date: 11/06/2021   PT End of Session - 11/06/21 1056     Visit Number 10    Number of Visits 19    Date for PT Re-Evaluation 12/03/21    Authorization Type MCR/MCD    Progress Note Due on Visit 10    PT Start Time 1021    PT Stop Time 1109    PT Time Calculation (min) 48 min    Activity Tolerance Patient tolerated treatment well    Behavior During Therapy [facility] for tasks assessed/performed             Past Medical History:  Diagnosis Date   Arthritis    Chronic kidney disease    reduced kidney function. Stage III   Diabetes mellitus without complication (HCC)    DVT (deep venous thrombosis) [facility])    age indeterminate LLE DVT 02/09/20   Dyspnea    On exertion   History of kidney stones    History of pulmonary embolus (PE)    Hypertension    Lipidemia    Prostate enlargement    Pulmonary embolism ([place]) 2009   Sleep apnea     Past Surgical History:  Procedure Laterality Date   CYSTOSCOPY W/ URETERAL STENT PLACEMENT Left 10/08/2020   Procedure: CYSTOSCOPY WITH RETROGRADE PYELOGRAM/URETERAL STENT PLACEMENT;  Surgeon: Irine Seal, MD;  Location: [place];  Service: Urology;  Laterality: Left;   CYSTOSCOPY/URETEROSCOPY/HOLMIUM LASER/STENT PLACEMENT Right 06/07/2020   Procedure: CYSTOSCOPY/URETEROSCOPY/HOLMIUM LASER/STENT PLACEMENT;  Surgeon: Lucas Mallow, MD;  [facility];  Service: Urology;  Laterality: Right;   CYSTOSCOPY/URETEROSCOPY/HOLMIUM LASER/STENT PLACEMENT Left 10/19/2020   Procedure: CYSTOSCOPY LEFT URETEROSCOPY/HOLMIUM LASER/STENT EXCHANGE;  Surgeon: Irine Seal, MD;  [facility];  Service: Urology;  Laterality: Left;   TOTAL KNEE  ARTHROPLASTY Left 08/06/2021   Procedure: LEFT TOTAL KNEE ARTHROPLASTY;  Surgeon: Leandrew Koyanagi, MD;  Location: [place];  Service: Orthopedics;  Laterality: Left;    There were no vitals filed for this visit.   Subjective Assessment - 11/06/21 1040     Subjective I am a 4-5/10  today. but I can walk around [employer] for an hour and I can do that.  But sometimes I can walk for only 20 minutes Pt is not consistant about walking every day. I have more problems over uneven ground    Pertinent History L TKR 08/06/21    How long can you sit comfortably? unlimited    How long can you stand comfortably? hour    How long can you walk comfortably? 30 -45 minutes  except when knee inflamed when doing uneven ground    Patient Stated Goals I would like to get back to normal gait; ideally not use the cane.    Pain Score 4     Pain Location Knee    Pain Orientation Left    Pain Descriptors / Indicators Aching;Tightness    Pain Type Chronic pain    Pain Onset More than a month ago    Pain Frequency Intermittent              [facility] Adult PT Treatment/Exercise:  Gait  Pt worked on heel strike. Pt not using cane except for uneven ground.  Pt remembers  heel strike and forward translation improved from evaluation.  Pt utilized Geologist, engineering for input.  Pt tends to ambulate intially with side to side translation with decreased knee extension on Left due to 7-10 degree lack of extension.  Pt does improve with visual input.   Up and down steps x 20 with 3/10 pain   Therapeutic Exercise: NuStep L5 x 6 min with UE/LE while taking subejct Step down 4 inch step with PT R handhold assist 2 x 10 Left LE on step, heel tap on R Terminal knee ext on L with ball on wall 2 x 10 Heel prop  Supine quad set with heel prop 10 x 5 sec and  also pressure applied  with 10 # weight for added stretch. MHP applied to posterior knee x 5 min  45 sec hold left hamstring x 2  then hamstring activation while sitting on rolling stool and  pulling self 20 feet x 3 with Left LE Squat with 10 # 2 x 10   Manual Therapy: Seated and supine tibfem mobs AP/PA combine with rotational mobs to improve knee flexion and extension Seated knee flexion PROM with tibial IR 5 x 10 sec Supine PF mobs sup/inf to improve knee flexion and extension Supine knee flexion and extension PROM     not done this session  Seated hamstring stretch with manual pressure over knee into extension 3 x 30 sec Slant board calf stretch 3 x 30 sec Knee extension machine SL with 10# 1 x 10  and then 15 # 1 x 10  (right 20#) Leg press (omega) SL 25# 1 x 10 each  1 x 10 35#    [facility] PT Assessment - 11/06/21 0001       Assessment   Medical Diagnosis L TKR    Referring Provider (PT) Leandrew Koyanagi, MD    Onset Date/Surgical Date 08/06/21      Observation/Other Assessments   Focus on Therapeutic Outcomes (FOTO)  85% functional status      AROM   Left Knee Extension 10   10 with patient only AROM , 7 PROM   Left Knee Flexion 120   PROM 123     Strength   Strength Assessment Site Knee                                      PT Short Term Goals - 10/18/21 1503       PT SHORT TERM GOAL #1   Title Pt will be indep with HEP to assist with improved functional mobility    Baseline is independent  usually every other day    Time 3    Period Weeks    Status Achieved    Target Date 10/02/21      PT SHORT TERM GOAL #2   Title Pt will demo improved L knee extension to lacking </= -10 degrees measured supine to assist with gait    Baseline -8    Time 3    Period Weeks    Status Achieved    Target Date 10/02/21      PT SHORT TERM GOAL #3   Title pt will demo improved L knee flexion to >/=108 degrees to assist with normalized gait    Baseline 118    Time 3    Period Weeks    Status Achieved    Target Date 10/02/21  PT Long Term Goals - 11/06/21 1038       PT LONG TERM GOAL #1   Title Pt will be able to  ambulate x 45 min without a cane with </=2/10 L knee pain    Baseline patient continues to ambulate using SPC and reports pain 3-4/10 with walking extended periods    Time 6    Period Weeks    Status On-going    Target Date 12/03/21      PT LONG TERM GOAL #2   Title Pt will be able to stand x 1.5 hours to perform household activities with </=3/10 L knee pain    Baseline patient reports increased pain with standing extended periods, doing most of the cooking for about 2 hours    Time 6    Period Weeks    Status Achieved    Target Date 12/03/21      PT LONG TERM GOAL #3   Title pt will be able to walk up/down stairs reciprocally at home with good control with pain </=3/10 L knee    Baseline Reporting going up and down steps about 3/10 good but painful    Time 6    Period Weeks    Status On-going    Target Date 12/03/21      PT LONG TERM GOAL #4   Title Pt will be able to demo improved gait with heel strike and knee flex/ext L at approp phases of gait x 75% of the time with least restrictive assistive device.    Baseline Pt does exhibit improved gait with heel strike, and utilized mirror today for visual input  75% of time.  Only uses cane for uneven ground.    Time 6    Period Weeks    Status Achieved    Target Date 12/03/21      PT LONG TERM GOAL #5   Title pt will demo improved foto score to 58% function    Baseline 52% functional status eval   85% 11-06-21    Time 6    Period Weeks    Status Achieved    Target Date 12/03/21                   Plan - 11/06/21 1122     Clinical Impression Statement Pt tolerated PT today with no adverse effects. Pt was 5/10 entering clinic but after RX session and movement he was a 3/10 even going up and down steps.  Began squating with 10 lb weights today.  Pt with improved patellar mobility especially superiorly today. Pt achieved LTG #2, 4 and 5 today.  He is able to stand for about 2 hours to cook Christmas dinner and is able to  shop at least an hour at [employer].  He does report that sometimes he has pain after only 20 minutes shopping but was encouraged that increasing movement and wt training can help with strength.  FOTO report surpassed predicted intake at 85%.  Pt does have AROM 10ext/to 120 flexion.  PROM 7 to 123.  Pt will benefit form increasing strength in LE and hips for better gait.  Pt is able to perform gait 75% of time with heel strike although with some Ext rotation of R foot( need for hip strengthening.  Will continue to maximize strength and complete goals until end of January to independent HEP  1 x a week   Personal Factors and Comorbidities Comorbidity 1;Comorbidity 2;Comorbidity 3+    Comorbidities Hx  of PE, LBP, syncope hx, DM 2, dyspnea on exertion    Examination-Activity Limitations Squat;Stairs;Locomotion Level;Other    Examination-Participation Restrictions Cleaning;Shop;Community Activity    PT Frequency 2x / week    PT Duration 6 weeks    PT Treatment/Interventions ADLs/Self Care Home Management;Cryotherapy;DME Instruction;Gait training;Stair training;Functional mobility training;Neuromuscular re-education;Balance training;Therapeutic exercise;Therapeutic activities;Patient/family education;Manual techniques;Passive range of motion;Dry needling;Taping    PT Next Visit Plan Progress not last visit.  RX #11 work on hip strength and squats with added weight, hamstring strength.   progress PRN, manual/stretching for knee motion, progress strength, balance training    PT Home Exercise Plan Access Code: [email or web address]    Consulted and Agree with Plan of Care Patient             Patient will benefit from skilled therapeutic intervention in order to improve the following deficits and impairments:  Abnormal gait, Decreased endurance, Decreased mobility, Difficulty walking, Hypomobility, Increased edema, Decreased range of motion, Decreased activity tolerance, Decreased strength, Impaired flexibility,  Pain  Visit Diagnosis: Other abnormalities of gait and mobility  Muscle weakness (generalized)     Problem List Patient Active Problem List   Diagnosis Date Noted   Status post total left knee replacement 08/06/2021   Encounter for medication monitoring 07/10/2021   UTI (urinary tract infection) 10/09/2020   Acute UTI 10/08/2020   Ureterolithiasis    Gross hematuria 06/16/2020   Renal lithiasis 06/07/2020   Renal stone 06/06/2020   Type 2 diabetes mellitus with stage 3b chronic kidney disease, with long-term current use of insulin ([place]) 05/26/2020   Visual disturbance 03/30/2020   Chronic pain of left knee 03/30/2020   Snores 03/30/2020   Chronic kidney disease, stage 3b ([place]) 03/30/2020   Midline low back pain without sciatica 03/30/2020   Hospital discharge follow-up 02/25/2020   Type 2 diabetes mellitus with diabetic polyneuropathy, with long-term current use of insulin ([place]) 02/23/2020   Syncope 02/08/2020   Renal stones 01/30/2020   Diminished pulses in lower extremity 01/26/2020   AKI (acute kidney injury) ([place]) 01/26/2020   CKD (chronic kidney disease) stage 4, GFR 15-29 ml/min (HCC) 01/05/2020   Hyperkalemia 01/05/2020   Long term (current) use of anticoagulants 12/22/2019   Elevated cholesterol 12/17/2019   Primary osteoarthritis of left knee 12/17/2019   Arthritis 12/17/2019   Type 2 diabetes mellitus with hyperglycemia, with long-term current use of insulin ([place]) 12/17/2019   History of pulmonary embolism 12/17/2019   Essential hypertension 12/17/2019   Voncille Lo, PT, [place] Certified Exercise Expert for the Aging Adult  11/06/21 11:37 AM Phone: [phone] Fax: [facility] [address] [place], [place], [address] Phone: [phone]   Fax:  [phone]  Name: [name] MRN: [id number] Date of Birth: June 21, 1956

## 2021-11-09 ENCOUNTER — Encounter: Payer: Self-pay | Admitting: Physical Therapy

## 2021-11-09 ENCOUNTER — Other Ambulatory Visit: Payer: Self-pay

## 2021-11-09 ENCOUNTER — Ambulatory Visit: Payer: Medicare Other | Admitting: Physical Therapy

## 2021-11-09 DIAGNOSIS — R2689 Other abnormalities of gait and mobility: Secondary | ICD-10-CM

## 2021-11-09 DIAGNOSIS — M6281 Muscle weakness (generalized): Secondary | ICD-10-CM

## 2021-11-09 NOTE — Therapy (Signed)
[place], [place], [address] Phone: [phone]   Fax:  [phone]  Physical Therapy Treatment  Patient Details  Name: [name] MRN: [id number] Date of Birth: Oct 30, 1956 Referring Provider (PT): [name] Koyanagi, MD   Encounter Date: 11/09/2021   PT End of Session - 11/09/21 1044     Visit Number 11    Number of Visits 19    Date for PT Re-Evaluation 12/03/21    Authorization Type MCR/MCD    Progress Note Due on Visit 10    PT Start Time 1045    PT Stop Time 1130    PT Time Calculation (min) 45 min    Activity Tolerance Patient tolerated treatment well    Behavior During Therapy [name] for tasks assessed/performed             Past Medical History:  Diagnosis Date   Arthritis    Chronic kidney disease    reduced kidney function. Stage III   Diabetes mellitus without complication (HCC)    DVT (deep venous thrombosis) [name])    age indeterminate LLE DVT 02/09/20   Dyspnea    On exertion   History of kidney stones    History of pulmonary embolus (PE)    Hypertension    Lipidemia    Prostate enlargement    Pulmonary embolism ([place]) 2009   Sleep apnea     Past Surgical History:  Procedure Laterality Date   CYSTOSCOPY W/ URETERAL STENT PLACEMENT Left 10/08/2020   Procedure: CYSTOSCOPY WITH RETROGRADE PYELOGRAM/URETERAL STENT PLACEMENT;  Surgeon: [name] Seal, MD;  Location: [place];  Service: Urology;  Laterality: Left;   CYSTOSCOPY/URETEROSCOPY/HOLMIUM LASER/STENT PLACEMENT Right 06/07/2020   Procedure: CYSTOSCOPY/URETEROSCOPY/HOLMIUM LASER/STENT PLACEMENT;  Surgeon: [name] Mallow, MD;  [facility];  Service: Urology;  Laterality: Right;   CYSTOSCOPY/URETEROSCOPY/HOLMIUM LASER/STENT PLACEMENT Left 10/19/2020   Procedure: CYSTOSCOPY LEFT URETEROSCOPY/HOLMIUM LASER/STENT EXCHANGE;  Surgeon: [name] Seal, MD;  [facility];  Service: Urology;  Laterality: Left;   TOTAL KNEE ARTHROPLASTY Left  08/06/2021   Procedure: LEFT TOTAL KNEE ARTHROPLASTY;  Surgeon: [name] Koyanagi, MD;  Location: [place];  Service: Orthopedics;  Laterality: Left;    There were no vitals filed for this visit.   Subjective Assessment - 11/09/21 1046     Subjective Patient reports he is doing well, states both knees and lower back are acting up today.    Patient Stated Goals I would like to get back to normal gait; ideally not use the cane.    Currently in Pain? Yes    Pain Score 2     Pain Location Knee    Pain Orientation Left    Pain Descriptors / Indicators Aching;Tightness    Pain Type Chronic pain    Pain Onset More than a month ago    Pain Frequency Intermittent    Aggravating Factors  walking extended periods, standing, going down stairs                  [facility] Adult PT Treatment/Exercise:   Therapeutic Exercise: NuStep L5 x 5 min with UE/LE while taking subjective Seated hamstring stretch with manual pressure over knee into extension 2 x 30 sec Slant board calf stretch 2 x 30 sec Supine quad set with heel prop 10 x 5 sec Squat to elevated table with 10# at chest 2 x 10 Leg press (omega) SL 25# x 10 each, 30# x 10 each Knee extension machine SL with  15# 2 x 10 each Knee flexion machine 35# 2 x 10 Deadlift with 25# from 8" box 2 x 10   Manual Therapy: Supine PF mobs sup/inf to improve knee flexion and extension Supine knee flexion and extension PROM Modified thomas stretch PROM         PT Education - 11/09/21 1049     Education Details HEP    Person(s) Educated Patient    Methods Explanation;Demonstration;Verbal cues    Comprehension Verbalized understanding;Returned demonstration;Verbal cues required;Need further instruction              PT Short Term Goals - 10/18/21 1503       PT SHORT TERM GOAL #1   Title Pt will be indep with HEP to assist with improved functional mobility    Baseline is independent  usually every other day    Time 3    Period Weeks     Status Achieved    Target Date 10/02/21      PT SHORT TERM GOAL #2   Title Pt will demo improved L knee extension to lacking </= -10 degrees measured supine to assist with gait    Baseline -8    Time 3    Period Weeks    Status Achieved    Target Date 10/02/21      PT SHORT TERM GOAL #3   Title pt will demo improved L knee flexion to >/=108 degrees to assist with normalized gait    Baseline 118    Time 3    Period Weeks    Status Achieved    Target Date 10/02/21               PT Long Term Goals - 11/06/21 1038       PT LONG TERM GOAL #1   Title Pt will be able to ambulate x 45 min without a cane with </=2/10 L knee pain    Baseline patient continues to ambulate using SPC and reports pain 3-4/10 with walking extended periods    Time 6    Period Weeks    Status On-going    Target Date 12/03/21      PT LONG TERM GOAL #2   Title Pt will be able to stand x 1.5 hours to perform household activities with </=3/10 L knee pain    Baseline patient reports increased pain with standing extended periods, doing most of the cooking for about 2 hours    Time 6    Period Weeks    Status Achieved    Target Date 12/03/21      PT LONG TERM GOAL #3   Title pt will be able to walk up/down stairs reciprocally at home with good control with pain </=3/10 L knee    Baseline Reporting going up and down steps about 3/10 good but painful    Time 6    Period Weeks    Status On-going    Target Date 12/03/21      PT LONG TERM GOAL #4   Title Pt will be able to demo improved gait with heel strike and knee flex/ext L at approp phases of gait x 75% of the time with least restrictive assistive device.    Baseline Pt does exhibit improved gait with heel strike, and utilized mirror today for visual input  75% of time.  Only uses cane for uneven ground.    Time 6    Period Weeks    Status Achieved  Target Date 12/03/21      PT LONG TERM GOAL #5   Title pt will demo improved foto score to 58%  function    Baseline 52% functional status eval   85% 11-06-21    Time 6    Period Weeks    Status Achieved    Target Date 12/03/21                   Plan - 11/09/21 1049     Clinical Impression Statement Patient tolerated therapy well with no adverse effects. Therapy continues to focus on improving knee motion and progressing strength. He seems to be progressing well with his strengthening exercises and demonstrates improve squatting capacity this visit and able to increase weight with resistance machines without any increase in knee pain. Overall is biggest deficit continues to be lack of knee extension which created gait deviations. Patient would benefit from continued skilled PT to progress his mobility and strength in order to reduce pain and maximize functional ability.    PT Treatment/Interventions ADLs/Self Care Home Management;Cryotherapy;DME Instruction;Gait training;Stair training;Functional mobility training;Neuromuscular re-education;Balance training;Therapeutic exercise;Therapeutic activities;Patient/family education;Manual techniques;Passive range of motion;Dry needling;Taping    PT Next Visit Plan work on hip strength and squats with added weight, hamstring strength.   progress PRN, manual/stretching for knee motion, progress strength, balance training    PT Home Exercise Plan Access Code: [email or web address]    Consulted and Agree with Plan of Care Patient             Patient will benefit from skilled therapeutic intervention in order to improve the following deficits and impairments:  Abnormal gait, Decreased endurance, Decreased mobility, Difficulty walking, Hypomobility, Increased edema, Decreased range of motion, Decreased activity tolerance, Decreased strength, Impaired flexibility, Pain  Visit Diagnosis: Other abnormalities of gait and mobility  Muscle weakness (generalized)     Problem List Patient Active Problem List   Diagnosis Date Noted   Status post total  left knee replacement 08/06/2021   Encounter for medication monitoring 07/10/2021   UTI (urinary tract infection) 10/09/2020   Acute UTI 10/08/2020   Ureterolithiasis    Gross hematuria 06/16/2020   Renal lithiasis 06/07/2020   Renal stone 06/06/2020   Type 2 diabetes mellitus with stage 3b chronic kidney disease, with long-term current use of insulin ([place]) 05/26/2020   Visual disturbance 03/30/2020   Chronic pain of left knee 03/30/2020   Snores 03/30/2020   Chronic kidney disease, stage 3b ([place]) 03/30/2020   Midline low back pain without sciatica 03/30/2020   Glass discharge follow-up 02/25/2020   Type 2 diabetes mellitus with diabetic polyneuropathy, with long-term current use of insulin ([place]) 02/23/2020   Syncope 02/08/2020   Renal stones 01/30/2020   Diminished pulses in lower extremity 01/26/2020   AKI (acute kidney injury) ([place]) 01/26/2020   CKD (chronic kidney disease) stage 4, GFR 15-29 ml/min (HCC) 01/05/2020   Hyperkalemia 01/05/2020   Long term (current) use of anticoagulants 12/22/2019   Elevated cholesterol 12/17/2019   Primary osteoarthritis of left knee 12/17/2019   Arthritis 12/17/2019   Type 2 diabetes mellitus with hyperglycemia, with long-term current use of insulin ([place]) 12/17/2019   History of pulmonary embolism 12/17/2019   Essential hypertension 12/17/2019    [name], PT, DPT, LAT, ATC 11/09/21  11:35 AM Phone: [phone] Fax: [facility] [address] [place], [place], [address] Phone: [phone]   Fax:  [phone]  Name: [name] MRN: [id number] Date of Birth:  08/28/1956 ° ° ° °

## 2021-11-13 ENCOUNTER — Other Ambulatory Visit: Payer: Self-pay

## 2021-11-13 ENCOUNTER — Encounter: Payer: Self-pay | Admitting: Physical Therapy

## 2021-11-13 ENCOUNTER — Ambulatory Visit: Payer: Medicare Other | Admitting: Physical Therapy

## 2021-11-13 DIAGNOSIS — R2689 Other abnormalities of gait and mobility: Secondary | ICD-10-CM | POA: Diagnosis not present

## 2021-11-13 DIAGNOSIS — M6281 Muscle weakness (generalized): Secondary | ICD-10-CM | POA: Diagnosis not present

## 2021-11-13 NOTE — Patient Instructions (Signed)
Access Code: [email or web address] URL: [email or web address] Date: 11/13/2021 Prepared by: Voncille Lo  Standing at bathroom sink and brush teeth for 2 minutes and try to practice standing on one leg for a minute each.    Supine ITB Stretch with Strap - 2-3 x daily - 7 x weekly - 1 sets - 2-3 reps - 30 to 60 sec hold Hooklying Hamstring Stretch with Strap - 2-3 x daily - 7 x weekly - 1 sets - 2-3 reps - 30-60 sec hold    Voncille Lo, PT, [facility] Certified Exercise Expert for the Aging Adult  11/13/21 10:53 AM Phone: [phone] Fax: [phone]

## 2021-11-13 NOTE — Therapy (Signed)
[place], [place], [address] Phone: [phone]   Fax:  [phone]  Physical Therapy Treatment  Patient Details  Name: [name] MRN: [id number] Date of Birth: Nov 28, 1955 Referring Provider (PT): Leandrew Koyanagi, MD   Encounter Date: 11/13/2021   PT End of Session - 11/13/21 1009     Visit Number 12    Number of Visits 19    Date for PT Re-Evaluation 12/03/21    Authorization Type MCR/MCD    PT Start Time 1010    PT Stop Time 1056    PT Time Calculation (min) 46 min    Activity Tolerance Patient tolerated treatment well    Behavior During Therapy [facility] for tasks assessed/performed             Past Medical History:  Diagnosis Date   Arthritis    Chronic kidney disease    reduced kidney function. Stage III   Diabetes mellitus without complication (HCC)    DVT (deep venous thrombosis) [facility])    age indeterminate LLE DVT 02/09/20   Dyspnea    On exertion   History of kidney stones    History of pulmonary embolus (PE)    Hypertension    Lipidemia    Prostate enlargement    Pulmonary embolism ([place]) 2009   Sleep apnea     Past Surgical History:  Procedure Laterality Date   CYSTOSCOPY W/ URETERAL STENT PLACEMENT Left 10/08/2020   Procedure: CYSTOSCOPY WITH RETROGRADE PYELOGRAM/URETERAL STENT PLACEMENT;  Surgeon: Irine Seal, MD;  Location: [facility];  Service: Urology;  Laterality: Left;   CYSTOSCOPY/URETEROSCOPY/HOLMIUM LASER/STENT PLACEMENT Right 06/07/2020   Procedure: CYSTOSCOPY/URETEROSCOPY/HOLMIUM LASER/STENT PLACEMENT;  Surgeon: Lucas Mallow, MD;  [facility];  Service: Urology;  Laterality: Right;   CYSTOSCOPY/URETEROSCOPY/HOLMIUM LASER/STENT PLACEMENT Left 10/19/2020   Procedure: CYSTOSCOPY LEFT URETEROSCOPY/HOLMIUM LASER/STENT EXCHANGE;  Surgeon: Irine Seal, MD;  [facility];  Service: Urology;  Laterality: Left;   TOTAL KNEE ARTHROPLASTY Left 08/06/2021   Procedure: LEFT  TOTAL KNEE ARTHROPLASTY;  Surgeon: Leandrew Koyanagi, MD;  Location: [facility];  Service: Orthopedics;  Laterality: Left;    There were no vitals filed for this visit.   Subjective Assessment - 11/13/21 1008     Subjective I am about the same . Probably a 1/10 today.  I have generally pretty good sleep    Pertinent History L TKR 08/06/21    Limitations Standing;House hold activities    How long can you sit comfortably? unlimited    How long can you stand comfortably? 2 hours    How long can you walk comfortably? 30 -45 minutes  except when knee inflamed when doing uneven ground    Patient Stated Goals I would like to get back to normal gait; ideally not use the cane.    Currently in Pain? Yes    Pain Score 1     Pain Location Knee    Pain Orientation Left    Pain Descriptors / Indicators Aching;Tightness    Pain Type Chronic pain    Pain Onset More than a month ago    Pain Frequency Intermittent              [facility] Adult PT Treatment/Exercise:  6 min walk test     Therapeutic Exercise: NuStep L5 x 5 min with UE/LE while taking subjective Leg press (omega) SL 35#  1 x 10  #45 2 x 10 Knee extension machine SL with 15# 1  x 10 each then SL with 20# 1 x 10 each Knee flexion machine 35# 2 x 10 bil. Deadlift with 25# from 8" box 2 x 10 Squat to chair with 25# KB 1x12 and then step downs with 4 in step 1 x 8, then additional 25 # squat to chair, could not continue step downs due to irritation of L IT band SLS work on There Ex pad with L and R  told to practice while brushing teeth at home  2 minutes Supine Hamstring stretch with green stretch out strap and then IT band stretch 30 sec to 60 sec each x 2 Hamstring curls standing with 10 lb wt on L 2 x 10 reps Supine quad set with heel prop 10 x 5 sec Standing knee flexion with Left foot on chair to maximize knee flexion   Manual Therapy: Supine PF mobs sup/inf to improve knee flexion and extension Supine knee flexion and extension  PROM Showed pt how to self mob with post mob of tibia over femur with towel roll with knee flexion/ gained to 122 flexion  [facility] PT Assessment - 11/13/21 0001       AROM   Left Knee Extension 10   10 with patient only AROM , 7 PROM   Left Knee Flexion 122   PROM 125                                   PT Education - 11/13/21 1126     Education Details updated HEP with ITband and hamstring stretch with stretch out strap. SLS while simulating brushing teach. on compliant and non compliant surfaces    Person(s) Educated Patient    Methods Explanation;Demonstration;Tactile cues;Verbal cues;Handout    Comprehension Verbalized understanding;Returned demonstration              PT Short Term Goals - 10/18/21 1503       PT SHORT TERM GOAL #1   Title Pt will be indep with HEP to assist with improved functional mobility    Baseline is independent  usually every other day    Time 3    Period Weeks    Status Achieved    Target Date 10/02/21      PT SHORT TERM GOAL #2   Title Pt will demo improved L knee extension to lacking </= -10 degrees measured supine to assist with gait    Baseline -8    Time 3    Period Weeks    Status Achieved    Target Date 10/02/21      PT SHORT TERM GOAL #3   Title pt will demo improved L knee flexion to >/=108 degrees to assist with normalized gait    Baseline 118    Time 3    Period Weeks    Status Achieved    Target Date 10/02/21               PT Long Term Goals - 11/06/21 1038       PT LONG TERM GOAL #1   Title Pt will be able to ambulate x 45 min without a cane with </=2/10 L knee pain    Baseline patient continues to ambulate using SPC and reports pain 3-4/10 with walking extended periods    Time 6    Period Weeks    Status On-going    Target Date 12/03/21      PT  LONG TERM GOAL #2   Title Pt will be able to stand x 1.5 hours to perform household activities with </=3/10 L knee pain    Baseline patient  reports increased pain with standing extended periods, doing most of the cooking for about 2 hours    Time 6    Period Weeks    Status Achieved    Target Date 12/03/21      PT LONG TERM GOAL #3   Title pt will be able to walk up/down stairs reciprocally at home with good control with pain </=3/10 L knee    Baseline Reporting going up and down steps about 3/10 good but painful    Time 6    Period Weeks    Status On-going    Target Date 12/03/21      PT LONG TERM GOAL #4   Title Pt will be able to demo improved gait with heel strike and knee flex/ext L at approp phases of gait x 75% of the time with least restrictive assistive device.    Baseline Pt does exhibit improved gait with heel strike, and utilized mirror today for visual input  75% of time.  Only uses cane for uneven ground.    Time 6    Period Weeks    Status Achieved    Target Date 12/03/21      PT LONG TERM GOAL #5   Title pt will demo improved foto score to 58% function    Baseline 52% functional status eval   85% 11-06-21    Time 6    Period Weeks    Status Achieved    Target Date 12/03/21                   Plan - 11/13/21 1038     Clinical Impression Statement Pt enters clinic with sligth antalgic gait.  Pt continues to have decrease full extension of Left knee.  AROM ext 10 PROM 7,  AROM flexion 122 PROM 125,  Pt does have pain over Left IT band and was given stretching with stretch out strap for hamstring and IT Band in supine due to being easiest for pt to replicate.  Pt was able to perform exercises with increasing weights on machines and with KB and cuff weights.  Pt continues to remain at same 10 degrees lack of knee ext. Mr Winnett was instructed /and re educated on heavy load long duration in Long sitting (with ankle on ottoman at home) and leave for 30 minutes while watching show with 20 lb over Left quadriceps to encourage sustained stretch. Pt is trying to maximize his AROM. Pt has 2 appt scheduled to  reinforce HEP before possible DC    Personal Factors and Comorbidities Comorbidity 1;Comorbidity 2;Comorbidity 3+    Comorbidities Hx of PE, LBP, syncope hx, DM 2, dyspnea on exertion    Examination-Activity Limitations Squat;Stairs;Locomotion Level;Other    Examination-Participation Restrictions Cleaning;Shop;Community Activity    PT Frequency 2x / week    PT Duration 6 weeks    PT Treatment/Interventions ADLs/Self Care Home Management;Cryotherapy;DME Instruction;Gait training;Stair training;Functional mobility training;Neuromuscular re-education;Balance training;Therapeutic exercise;Therapeutic activities;Patient/family education;Manual techniques;Passive range of motion;Dry needling;Taping    PT Next Visit Plan work on hip strength and squats with added weight, hamstring strength.   progress PRN, manual/stretching for knee motion, progress strength, balance training    PT Home Exercise Plan Access Code: [email or web address]    Consulted and Agree with Plan of Care Patient  Patient will benefit from skilled therapeutic intervention in order to improve the following deficits and impairments:  Abnormal gait, Decreased endurance, Decreased mobility, Difficulty walking, Hypomobility, Increased edema, Decreased range of motion, Decreased activity tolerance, Decreased strength, Impaired flexibility, Pain  Visit Diagnosis: Other abnormalities of gait and mobility  Muscle weakness (generalized)     Problem List Patient Active Problem List   Diagnosis Date Noted   Status post total left knee replacement 08/06/2021   Encounter for medication monitoring 07/10/2021   UTI (urinary tract infection) 10/09/2020   Acute UTI 10/08/2020   Ureterolithiasis    Gross hematuria 06/16/2020   Renal lithiasis 06/07/2020   Renal stone 06/06/2020   Type 2 diabetes mellitus with stage 3b chronic kidney disease, with long-term current use of insulin ([place]) 05/26/2020   Visual disturbance 03/30/2020    Chronic pain of left knee 03/30/2020   Snores 03/30/2020   Chronic kidney disease, stage 3b ([place]) 03/30/2020   Midline low back pain without sciatica 03/30/2020   Hospital discharge follow-up 02/25/2020   Type 2 diabetes mellitus with diabetic polyneuropathy, with long-term current use of insulin ([place]) 02/23/2020   Syncope 02/08/2020   Renal stones 01/30/2020   Diminished pulses in lower extremity 01/26/2020   AKI (acute kidney injury) ([place]) 01/26/2020   CKD (chronic kidney disease) stage 4, GFR 15-29 ml/min (HCC) 01/05/2020   Hyperkalemia 01/05/2020   Long term (current) use of anticoagulants 12/22/2019   Elevated cholesterol 12/17/2019   Primary osteoarthritis of left knee 12/17/2019   Arthritis 12/17/2019   Type 2 diabetes mellitus with hyperglycemia, with long-term current use of insulin ([place]) 12/17/2019   History of pulmonary embolism 12/17/2019   Essential hypertension 12/17/2019    Voncille Lo, PT, [place] Certified Exercise Expert for the Aging Adult  11/13/21 11:28 AM Phone: [phone] Fax: [place], [place], [address] Phone: [phone]   Fax:  [phone]  Name: [name] MRN: [id number] Date of Birth: July 13, 1956

## 2021-11-14 ENCOUNTER — Ambulatory Visit (INDEPENDENT_AMBULATORY_CARE_PROVIDER_SITE_OTHER): Payer: 59

## 2021-11-14 DIAGNOSIS — Z7901 Long term (current) use of anticoagulants: Secondary | ICD-10-CM | POA: Diagnosis not present

## 2021-11-14 LAB — POCT INR: INR: 1.8 — AB (ref 2.0–3.0)

## 2021-11-14 NOTE — Progress Notes (Signed)
Increase dose today to take 7.5 mg and then continue to take 5 mg daily except take 7.5 mg on Thurs and Sat. Recheck in  5 wks per pt request.

## 2021-11-14 NOTE — Patient Instructions (Signed)
Pre visit review using our clinic review tool, if applicable. No additional management support is needed unless otherwise documented below in the visit note.  Increase dose today to take 7.5 mg and then continue to take 5 mg daily except take 7.5 mg on Thurs and Sat. Recheck in  5 wks.

## 2021-11-20 ENCOUNTER — Encounter: Payer: Self-pay | Admitting: Physical Therapy

## 2021-11-20 ENCOUNTER — Ambulatory Visit: Payer: Medicare Other | Admitting: Physical Therapy

## 2021-11-20 ENCOUNTER — Other Ambulatory Visit: Payer: Self-pay

## 2021-11-20 DIAGNOSIS — M6281 Muscle weakness (generalized): Secondary | ICD-10-CM | POA: Diagnosis not present

## 2021-11-20 DIAGNOSIS — R2689 Other abnormalities of gait and mobility: Secondary | ICD-10-CM

## 2021-11-20 NOTE — Therapy (Signed)
[place], [place], [address] Phone: [phone]   Fax:  [phone]  Physical Therapy Treatment  Patient Details  Name: [name] MRN: [id number] Date of Birth: Nov 01, 1956 Referring Provider (PT): Leandrew Koyanagi, MD   Encounter Date: 11/20/2021   PT End of Session - 11/20/21 1134     Visit Number 13    Number of Visits 19    Date for PT Re-Evaluation 12/03/21    Authorization Type MCR/MCD    Progress Note Due on Visit 10    PT Start Time 1130    PT Stop Time 1215    PT Time Calculation (min) 45 min    Activity Tolerance Patient tolerated treatment well    Behavior During Therapy [facility] for tasks assessed/performed             Past Medical History:  Diagnosis Date   Arthritis    Chronic kidney disease    reduced kidney function. Stage III   Diabetes mellitus without complication (HCC)    DVT (deep venous thrombosis) [facility])    age indeterminate LLE DVT 02/09/20   Dyspnea    On exertion   History of kidney stones    History of pulmonary embolus (PE)    Hypertension    Lipidemia    Prostate enlargement    Pulmonary embolism ([place]) 2009   Sleep apnea     Past Surgical History:  Procedure Laterality Date   CYSTOSCOPY W/ URETERAL STENT PLACEMENT Left 10/08/2020   Procedure: CYSTOSCOPY WITH RETROGRADE PYELOGRAM/URETERAL STENT PLACEMENT;  Surgeon: Irine Seal, MD;  Location: [facility];  Service: Urology;  Laterality: Left;   CYSTOSCOPY/URETEROSCOPY/HOLMIUM LASER/STENT PLACEMENT Right 06/07/2020   Procedure: CYSTOSCOPY/URETEROSCOPY/HOLMIUM LASER/STENT PLACEMENT;  Surgeon: Lucas Mallow, MD;  [facility];  Service: Urology;  Laterality: Right;   CYSTOSCOPY/URETEROSCOPY/HOLMIUM LASER/STENT PLACEMENT Left 10/19/2020   Procedure: CYSTOSCOPY LEFT URETEROSCOPY/HOLMIUM LASER/STENT EXCHANGE;  Surgeon: Irine Seal, MD;  [facility];  Service: Urology;  Laterality: Left;   TOTAL KNEE ARTHROPLASTY Left  08/06/2021   Procedure: LEFT TOTAL KNEE ARTHROPLASTY;  Surgeon: Leandrew Koyanagi, MD;  Location: [place] [facility];  Service: Orthopedics;  Laterality: Left;    There were no vitals filed for this visit.   Subjective Assessment - 11/20/21 1135     Subjective Patient reports knee is not feeling too bad, both knees hurt equally. He reports that it takes him about 2 days to recover from workouts.    Patient Stated Goals I would like to get back to normal gait; ideally not use the cane.    Currently in Pain? Yes    Pain Score 2     Pain Location Knee    Pain Orientation Left    Pain Descriptors / Indicators Aching;Tightness    Pain Type Chronic pain    Pain Onset More than a month ago    Pain Frequency Intermittent    Aggravating Factors  walking extended periods, standing, going down stairs                [facility] PT Assessment - 11/20/21 0001       AROM   Left Knee Extension 8    Left Knee Flexion 116                   [facility] Adult PT Treatment/Exercise:  Therapeutic Exercise: Recumbent bike L2 x 5 min while taking subjective Seated hamstring stretch 3 x 30 sec each Standing calf stretch  at counter 3 x 30 sec each Supine ITB stretch with strap 3 x 30 sec (left only) Standing TKE with black band x 15 Goblet squat to table with 10# 2 x 10 Forward 8" step-up 2 x10 each SLS 3 x 30 sec        PT Education - 11/20/21 1139     Education Details HEP update    Person(s) Educated Patient    Methods Explanation;Demonstration;Verbal cues;Handout    Comprehension Verbalized understanding;Returned demonstration;Verbal cues required;Need further instruction              PT Short Term Goals - 10/18/21 1503       PT SHORT TERM GOAL #1   Title Pt will be indep with HEP to assist with improved functional mobility    Baseline is independent  usually every other day    Time 3    Period Weeks    Status Achieved    Target Date 10/02/21      PT SHORT TERM GOAL #2   Title Pt  will demo improved L knee extension to lacking </= -10 degrees measured supine to assist with gait    Baseline -8    Time 3    Period Weeks    Status Achieved    Target Date 10/02/21      PT SHORT TERM GOAL #3   Title pt will demo improved L knee flexion to >/=108 degrees to assist with normalized gait    Baseline 118    Time 3    Period Weeks    Status Achieved    Target Date 10/02/21               PT Long Term Goals - 11/06/21 1038       PT LONG TERM GOAL #1   Title Pt will be able to ambulate x 45 min without a cane with </=2/10 L knee pain    Baseline patient continues to ambulate using SPC and reports pain 3-4/10 with walking extended periods    Time 6    Period Weeks    Status On-going    Target Date 12/03/21      PT LONG TERM GOAL #2   Title Pt will be able to stand x 1.5 hours to perform household activities with </=3/10 L knee pain    Baseline patient reports increased pain with standing extended periods, doing most of the cooking for about 2 hours    Time 6    Period Weeks    Status Achieved    Target Date 12/03/21      PT LONG TERM GOAL #3   Title pt will be able to walk up/down stairs reciprocally at home with good control with pain </=3/10 L knee    Baseline Reporting going up and down steps about 3/10 good but painful    Time 6    Period Weeks    Status On-going    Target Date 12/03/21      PT LONG TERM GOAL #4   Title Pt will be able to demo improved gait with heel strike and knee flex/ext L at approp phases of gait x 75% of the time with least restrictive assistive device.    Baseline Pt does exhibit improved gait with heel strike, and utilized mirror today for visual input  75% of time.  Only uses cane for uneven ground.    Time 6    Period Weeks    Status Achieved  Target Date 12/03/21      PT LONG TERM GOAL #5   Title pt will demo improved foto score to 58% function    Baseline 52% functional status eval   85% 11-06-21    Time 6     Period Weeks    Status Achieved    Target Date 12/03/21                   Plan - 11/20/21 1139     Clinical Impression Statement Patient tolerated therapy well with no adverse effects. Therapy focused primarily on stretching to improve knee extension and continued strengthening. He continues to demonstrate around 8-10 deg knee extension deficit. He was encouraged to continue stretch and LLLD stretches at home. Provided calf stretch and reviewed ITB stretch with strap so patient could perform correctly. Likely discharge next visit. Patient would benefit from continued skilled PT to progress his mobility and strength in order to reduce pain and maximize functional ability.    PT Treatment/Interventions ADLs/Self Care Home Management;Cryotherapy;DME Instruction;Gait training;Stair training;Functional mobility training;Neuromuscular re-education;Balance training;Therapeutic exercise;Therapeutic activities;Patient/family education;Manual techniques;Passive range of motion;Dry needling;Taping    PT Next Visit Plan work on hip strength and squats with added weight, hamstring strength.   progress PRN, manual/stretching for knee motion, progress strength, balance training    PT Home Exercise Plan Access Code: [email or web address]    Consulted and Agree with Plan of Care Patient             Patient will benefit from skilled therapeutic intervention in order to improve the following deficits and impairments:  Abnormal gait, Decreased endurance, Decreased mobility, Difficulty walking, Hypomobility, Increased edema, Decreased range of motion, Decreased activity tolerance, Decreased strength, Impaired flexibility, Pain  Visit Diagnosis: Other abnormalities of gait and mobility  Muscle weakness (generalized)     Problem List Patient Active Problem List   Diagnosis Date Noted   Status post total left knee replacement 08/06/2021   Encounter for medication monitoring 07/10/2021   UTI (urinary tract  infection) 10/09/2020   Acute UTI 10/08/2020   Ureterolithiasis    Gross hematuria 06/16/2020   Renal lithiasis 06/07/2020   Renal stone 06/06/2020   Type 2 diabetes mellitus with stage 3b chronic kidney disease, with long-term current use of insulin ([place]) 05/26/2020   Visual disturbance 03/30/2020   Chronic pain of left knee 03/30/2020   Snores 03/30/2020   Chronic kidney disease, stage 3b ([place]) 03/30/2020   Midline low back pain without sciatica 03/30/2020   Hospital discharge follow-up 02/25/2020   Type 2 diabetes mellitus with diabetic polyneuropathy, with long-term current use of insulin ([place]) 02/23/2020   Syncope 02/08/2020   Renal stones 01/30/2020   Diminished pulses in lower extremity 01/26/2020   AKI (acute kidney injury) ([place]) 01/26/2020   CKD (chronic kidney disease) stage 4, GFR 15-29 ml/min (HCC) 01/05/2020   Hyperkalemia 01/05/2020   Long term (current) use of anticoagulants 12/22/2019   Elevated cholesterol 12/17/2019   Primary osteoarthritis of left knee 12/17/2019   Arthritis 12/17/2019   Type 2 diabetes mellitus with hyperglycemia, with long-term current use of insulin ([place]) 12/17/2019   History of pulmonary embolism 12/17/2019   Essential hypertension 12/17/2019    Hilda Blades, PT, DPT, LAT, ATC 11/20/21  12:20 PM Phone: [phone] Fax: [place] [facility] [address] [place], [place], [address] Phone: [phone]   Fax:  [phone]  Name: [name] MRN: [id number] Date of Birth: [birth year]

## 2021-11-20 NOTE — Patient Instructions (Signed)
Access Code: [email or web address] URL: [email or web address] Date: 11/20/2021 Prepared by: Hilda Blades  Exercises Seated Hamstring Stretch - 1 x daily - 7 x weekly - 1 sets - 3 reps - 30-60 sec hold Supine Knee Extension Stretch on Towel Roll - 1 x daily - 7 x weekly - 3 sets - 10 reps Supine Knee Extension Strengthening - 1 x daily - 7 x weekly - 3 sets - 10 reps Active Straight Leg Raise - 1 x daily - 7 x weekly - 3 sets - 10 reps Prone Quadriceps Stretch with Strap - 1 x daily - 7 x weekly - 1 sets - 3 reps - 30-60 sec hold Standing Terminal Knee Extension with Resistance - 1 x daily - 7 x weekly - 3 sets - 10 reps - 3 sec hold Sit to Stand Without Arm Support - 1 x daily - 7 x weekly - 3 sets - 10 reps Forward Step Up - 1 x daily - 7 x weekly - 3 sets - 10 reps Forward Step Down - 1 x daily - 7 x weekly - 3 sets - 10 reps Wall Sit - 1 x daily - 7 x weekly - 2 sets - 10 reps - 10 sec hold Supine ITB Stretch with Strap - 2-3 x daily - 7 x weekly - 1 sets - 2-3 reps - 30 to 60 sec hold Hooklying Hamstring Stretch with Strap - 2-3 x daily - 7 x weekly - 1 sets - 2-3 reps - 30-60 sec hold Standing Gastroc Stretch at Counter - 2-3 x daily - 7 x weekly - 3 reps - 30 seconds hold

## 2021-11-27 ENCOUNTER — Other Ambulatory Visit: Payer: Self-pay

## 2021-11-27 ENCOUNTER — Ambulatory Visit: Payer: Medicare Other | Admitting: Physical Therapy

## 2021-11-27 DIAGNOSIS — R2689 Other abnormalities of gait and mobility: Secondary | ICD-10-CM

## 2021-11-27 DIAGNOSIS — M6281 Muscle weakness (generalized): Secondary | ICD-10-CM | POA: Diagnosis not present

## 2021-11-27 NOTE — Therapy (Signed)
[place], [place], [address] Phone: [phone]   Fax:  [phone]  Physical Therapy Treatment/Discharge Note  Patient Details  Name: [name] MRN: [id number] Date of Birth: [birth year] Referring Provider (PT): Leandrew Koyanagi, MD   Encounter Date: 11/27/2021   PT End of Session - 11/27/21 1041     Visit Number 14    Number of Visits 19    Date for PT Re-Evaluation 12/03/21    Authorization Type MCR/MCD    PT Start Time 1017    PT Stop Time 1100    PT Time Calculation (min) 43 min    Activity Tolerance Patient tolerated treatment well    Behavior During Therapy [facility] for tasks assessed/performed             Past Medical History:  Diagnosis Date   Arthritis    Chronic kidney disease    reduced kidney function. Stage III   Diabetes mellitus without complication (HCC)    DVT (deep venous thrombosis) [facility])    age indeterminate LLE DVT 02/09/20   Dyspnea    On exertion   History of kidney stones    History of pulmonary embolus (PE)    Hypertension    Lipidemia    Prostate enlargement    Pulmonary embolism ([place]) 2009   Sleep apnea     Past Surgical History:  Procedure Laterality Date   CYSTOSCOPY W/ URETERAL STENT PLACEMENT Left 10/08/2020   Procedure: CYSTOSCOPY WITH RETROGRADE PYELOGRAM/URETERAL STENT PLACEMENT;  Surgeon: Irine Seal, MD;  Location: [facility];  Service: Urology;  Laterality: Left;   CYSTOSCOPY/URETEROSCOPY/HOLMIUM LASER/STENT PLACEMENT Right 06/07/2020   Procedure: CYSTOSCOPY/URETEROSCOPY/HOLMIUM LASER/STENT PLACEMENT;  Surgeon: Lucas Mallow, MD;  [facility];  Service: Urology;  Laterality: Right;   CYSTOSCOPY/URETEROSCOPY/HOLMIUM LASER/STENT PLACEMENT Left 10/19/2020   Procedure: CYSTOSCOPY LEFT URETEROSCOPY/HOLMIUM LASER/STENT EXCHANGE;  Surgeon: Irine Seal, MD;  [facility];  Service: Urology;  Laterality: Left;   TOTAL KNEE ARTHROPLASTY Left 08/06/2021    Procedure: LEFT TOTAL KNEE ARTHROPLASTY;  Surgeon: Leandrew Koyanagi, MD;  Location: [facility];  Service: Orthopedics;  Laterality: Left;    There were no vitals filed for this visit.   Subjective Assessment - 11/27/21 1020     Subjective Patient reports knee is not feeling too bad, both knees hurt equally.  Left TKR, Right OA  He is going to use the time to walk the dogs  more    Pertinent History L TKR 08/06/21    Limitations Standing;House hold activities    How long can you sit comfortably? unlimited    How long can you stand comfortably? 2 hours or more    How long can you walk comfortably? 30-45 min over uneven ground    Patient Stated Goals I would like to get back to normal gait; ideally not use the cane.    Currently in Pain? Yes    Pain Score 1     Pain Location Knee    Pain Descriptors / Indicators Tightness    Pain Type Chronic pain    Pain Onset More than a month ago    Pain Frequency Occasional             [facility] Adult PT Treatment:  DATE: 11-27-21    [facility] PT Assessment - 11/27/21 0001       Assessment   Medical Diagnosis L TKR    Referring Provider (PT) Leandrew Koyanagi, MD    Onset Date/Surgical Date 08/06/21      Observation/Other Assessments   Focus on Therapeutic Outcomes (FOTO)  85% functional status   taken 11-16-21     Functional Tests   Functional tests Sit to Stand      Sit to Stand   Comments 12.58 sec  normal is 13 sec      AROM   Left Knee Extension 8    Left Knee Flexion 118           Therapeutic Exercise: Deadlift education and demo with #30, #45 and #65   3 x 8 with # 65 with VC and TC Seated hamstring stretch 3 x 30 sec each Standing calf stretch at counter 3 x 30 sec each Supine ITB stretch with strap 3 x 30 sec (left only) Standing TKE with black band 2 x 10 Goblet squat to table with 25# 2 x 10 Forward 8" step-up 2 x10 each SLS 3 x 30 sec   Manual Therapy STW of left hamstring Patellar  mobs. Manual to Left knee mobs for increasing extension and flexion maximally                         PT Education - 11/27/21 1215     Education Details answered questions and reviewed HEP/need for continued daily streteching and flexibilty for maximizing extension    Person(s) Educated Patient    Methods Explanation;Demonstration;Tactile cues;Verbal cues    Comprehension Verbalized understanding;Returned demonstration              PT Short Term Goals - 10/18/21 1503       PT SHORT TERM GOAL #1   Title Pt will be indep with HEP to assist with improved functional mobility    Baseline is independent  usually every other day    Time 3    Period Weeks    Status Achieved    Target Date 10/02/21      PT SHORT TERM GOAL #2   Title Pt will demo improved L knee extension to lacking </= -10 degrees measured supine to assist with gait    Baseline -8    Time 3    Period Weeks    Status Achieved    Target Date 10/02/21      PT SHORT TERM GOAL #3   Title pt will demo improved L knee flexion to >/=108 degrees to assist with normalized gait    Baseline 118    Time 3    Period Weeks    Status Achieved    Target Date 10/02/21               PT Long Term Goals - 11/27/21 1030       PT LONG TERM GOAL #1   Title Pt will be able to ambulate x 45 min without a cane with </=2/10 L knee pain    Baseline Pt with 1/10pain in left knee and walks dogs  30-45 min daily    Time 6    Period Weeks    Status Achieved    Target Date 12/03/21      PT LONG TERM GOAL #2   Title Pt will be able to stand x 1.5 hours to perform household activities with </=  3/10 L knee pain    Baseline patient reports increased pain with standing extended periods, doing most of the cooking for about 2 hours    Time 6    Period Weeks    Status Achieved    Target Date 12/03/21      PT LONG TERM GOAL #3   Title pt will be able to walk up/down stairs reciprocally at home with good control  with pain </=3/10 L knee    Baseline reporting 1/10 pain or less    Time 6    Period Weeks    Status Achieved    Target Date 12/03/21      PT LONG TERM GOAL #4   Title Pt will be able to demo improved gait with heel strike and knee flex/ext L at approp phases of gait x 75% of the time with least restrictive assistive device.    Baseline Pt no longer uses cane and gait is more heel toe, slight antalgic at times due to decreased end range extension 8-10 degrees    Time 6    Period Weeks    Status Achieved    Target Date 12/03/21      PT LONG TERM GOAL #5   Title pt will demo improved foto score to 58% function    Baseline 52% functional status eval   85% 11-06-21    Time 6    Period Weeks    Status Achieved                   Plan - 11/27/21 1042     Clinical Impression Statement Pt for last appt and DC today with independence with HEP.  Pt AROM of Left knee post TKA -8 extension deficit and 118 max flexion.  Pt is stiffness dominant and was educated on importance of his flexibility exercises on HEP.  Pt was able to deadlift 65# today and box squat 25 #  Pt with MMT 4+/5 and 5/5 in LE today.  Pt FOTO 85% and was able to complete/achieve all STG and LTG.  Pt has maximized progress at this time but is cognizant of his need to continue stretching/strengthening daily    Personal Factors and Comorbidities Comorbidity 1;Comorbidity 2;Comorbidity 3+    Comorbidities Hx of PE, LBP, syncope hx, DM 2, dyspnea on exertion    Examination-Activity Limitations Squat;Stairs;Locomotion Level;Other    Examination-Participation Restrictions Cleaning;Shop;Community Activity    PT Frequency 2x / week    PT Duration 6 weeks    PT Treatment/Interventions ADLs/Self Care Home Management;Cryotherapy;DME Instruction;Gait training;Stair training;Functional mobility training;Neuromuscular re-education;Balance training;Therapeutic exercise;Therapeutic activities;Patient/family education;Manual  techniques;Passive range of motion;Dry needling;Taping    PT Next Visit Plan DC    PT Home Exercise Plan Access Code: [email or web address]    Consulted and Agree with Plan of Care Patient             Patient will benefit from skilled therapeutic intervention in order to improve the following deficits and impairments:  Abnormal gait, Decreased endurance, Decreased mobility, Difficulty walking, Hypomobility, Increased edema, Decreased range of motion, Decreased activity tolerance, Decreased strength, Impaired flexibility, Pain  Visit Diagnosis: Other abnormalities of gait and mobility  Muscle weakness (generalized)     Problem List Patient Active Problem List   Diagnosis Date Noted   Status post total left knee replacement 08/06/2021   Encounter for medication monitoring 07/10/2021   UTI (urinary tract infection) 10/09/2020   Acute UTI 10/08/2020   Ureterolithiasis    Gross hematuria 06/16/2020  Renal lithiasis 06/07/2020   Renal stone 06/06/2020   Type 2 diabetes mellitus with stage 3b chronic kidney disease, with long-term current use of insulin ([place]) 05/26/2020   Visual disturbance 03/30/2020   Chronic pain of left knee 03/30/2020   Snores 03/30/2020   Chronic kidney disease, stage 3b ([place]) 03/30/2020   Midline low back pain without sciatica 03/30/2020   Hospital discharge follow-up 02/25/2020   Type 2 diabetes mellitus with diabetic polyneuropathy, with long-term current use of insulin ([place]) 02/23/2020   Syncope 02/08/2020   Renal stones 01/30/2020   Diminished pulses in lower extremity 01/26/2020   AKI (acute kidney injury) ([place]) 01/26/2020   CKD (chronic kidney disease) stage 4, GFR 15-29 ml/min (HCC) 01/05/2020   Hyperkalemia 01/05/2020   Long term (current) use of anticoagulants 12/22/2019   Elevated cholesterol 12/17/2019   Primary osteoarthritis of left knee 12/17/2019   Arthritis 12/17/2019   Type 2 diabetes mellitus with hyperglycemia, with long-term current use of  insulin ([place]) 12/17/2019   History of pulmonary embolism 12/17/2019   Essential hypertension 12/17/2019   Voncille Lo, PT, [place] Certified Exercise Expert for the Aging Adult  11/27/21 12:22 PM Phone: [phone] Fax: [facility] [address] [place], [place], [address] Phone: [phone]   Fax:  [phone]  Name: [name] MRN: [id number] Date of Birth: 08-Mar-1956 PHYSICAL THERAPY DISCHARGE SUMMARY  Visits from Start of Care: 14  Current functional level related to goals / functional outcomes:  As above   Remaining deficits: Left knee flexion -8 extension deficit and 118 flexion max   Education / Equipment: HEP and walking program   Patient agrees to discharge. Patient goals were met. Patient is being discharged due to meeting the stated rehab goals.   Voncille Lo, PT, [place] Certified Exercise Expert for the Aging Adult  11/27/21 12:22 PM Phone: [phone] Fax: [phone]

## 2021-12-06 ENCOUNTER — Ambulatory Visit (INDEPENDENT_AMBULATORY_CARE_PROVIDER_SITE_OTHER): Payer: Medicare Other | Admitting: Nurse Practitioner

## 2021-12-06 ENCOUNTER — Inpatient Hospital Stay (HOSPITAL_COMMUNITY)
Admission: EM | Admit: 2021-12-06 | Discharge: 2021-12-13 | DRG: 871 | Disposition: A | Discharge status: Home Health | Payer: Medicare Other | Attending: Internal Medicine | Admitting: Internal Medicine

## 2021-12-06 ENCOUNTER — Other Ambulatory Visit: Payer: Self-pay

## 2021-12-06 ENCOUNTER — Encounter (HOSPITAL_COMMUNITY): Payer: Self-pay

## 2021-12-06 ENCOUNTER — Emergency Department (HOSPITAL_COMMUNITY): Payer: Medicare Other

## 2021-12-06 ENCOUNTER — Encounter: Payer: Self-pay | Admitting: Nurse Practitioner

## 2021-12-06 ENCOUNTER — Telehealth: Payer: Medicare Other | Admitting: Physician Assistant

## 2021-12-06 VITALS — BP 122/58 | HR 72 | Temp 96.1°F | Ht 73.0 in | Wt 239.0 lb

## 2021-12-06 DIAGNOSIS — N170 Acute kidney failure with tubular necrosis: Secondary | ICD-10-CM | POA: Diagnosis present

## 2021-12-06 DIAGNOSIS — Z9114 Patient's other noncompliance with medication regimen: Secondary | ICD-10-CM

## 2021-12-06 DIAGNOSIS — Z86711 Personal history of pulmonary embolism: Secondary | ICD-10-CM

## 2021-12-06 DIAGNOSIS — R000 Tachycardia, unspecified: Secondary | ICD-10-CM | POA: Diagnosis not present

## 2021-12-06 DIAGNOSIS — G4730 Sleep apnea, unspecified: Secondary | ICD-10-CM | POA: Diagnosis present

## 2021-12-06 DIAGNOSIS — I10 Essential (primary) hypertension: Secondary | ICD-10-CM | POA: Diagnosis present

## 2021-12-06 DIAGNOSIS — E1122 Type 2 diabetes mellitus with diabetic chronic kidney disease: Secondary | ICD-10-CM | POA: Diagnosis not present

## 2021-12-06 DIAGNOSIS — L03115 Cellulitis of right lower limb: Secondary | ICD-10-CM | POA: Diagnosis present

## 2021-12-06 DIAGNOSIS — N1832 Chronic kidney disease, stage 3b: Secondary | ICD-10-CM | POA: Diagnosis present

## 2021-12-06 DIAGNOSIS — Z7901 Long term (current) use of anticoagulants: Secondary | ICD-10-CM

## 2021-12-06 DIAGNOSIS — I129 Hypertensive chronic kidney disease with stage 1 through stage 4 chronic kidney disease, or unspecified chronic kidney disease: Secondary | ICD-10-CM | POA: Diagnosis not present

## 2021-12-06 DIAGNOSIS — Z86718 Personal history of other venous thrombosis and embolism: Secondary | ICD-10-CM

## 2021-12-06 DIAGNOSIS — L0390 Cellulitis, unspecified: Secondary | ICD-10-CM | POA: Diagnosis not present

## 2021-12-06 DIAGNOSIS — R42 Dizziness and giddiness: Secondary | ICD-10-CM

## 2021-12-06 DIAGNOSIS — E872 Acidosis, unspecified: Secondary | ICD-10-CM | POA: Diagnosis not present

## 2021-12-06 DIAGNOSIS — E785 Hyperlipidemia, unspecified: Secondary | ICD-10-CM | POA: Diagnosis not present

## 2021-12-06 DIAGNOSIS — R600 Localized edema: Secondary | ICD-10-CM | POA: Diagnosis not present

## 2021-12-06 DIAGNOSIS — Z794 Long term (current) use of insulin: Secondary | ICD-10-CM

## 2021-12-06 DIAGNOSIS — Z79899 Other long term (current) drug therapy: Secondary | ICD-10-CM | POA: Diagnosis not present

## 2021-12-06 DIAGNOSIS — R0689 Other abnormalities of breathing: Secondary | ICD-10-CM | POA: Diagnosis not present

## 2021-12-06 DIAGNOSIS — E1142 Type 2 diabetes mellitus with diabetic polyneuropathy: Secondary | ICD-10-CM | POA: Diagnosis not present

## 2021-12-06 DIAGNOSIS — Z0389 Encounter for observation for other suspected diseases and conditions ruled out: Secondary | ICD-10-CM | POA: Diagnosis not present

## 2021-12-06 DIAGNOSIS — N184 Chronic kidney disease, stage 4 (severe): Secondary | ICD-10-CM | POA: Diagnosis present

## 2021-12-06 DIAGNOSIS — Z8249 Family history of ischemic heart disease and other diseases of the circulatory system: Secondary | ICD-10-CM | POA: Diagnosis not present

## 2021-12-06 DIAGNOSIS — R6520 Severe sepsis without septic shock: Secondary | ICD-10-CM | POA: Diagnosis not present

## 2021-12-06 DIAGNOSIS — Z96652 Presence of left artificial knee joint: Secondary | ICD-10-CM | POA: Diagnosis not present

## 2021-12-06 DIAGNOSIS — R6889 Other general symptoms and signs: Secondary | ICD-10-CM

## 2021-12-06 DIAGNOSIS — R609 Edema, unspecified: Secondary | ICD-10-CM | POA: Diagnosis not present

## 2021-12-06 DIAGNOSIS — D72829 Elevated white blood cell count, unspecified: Secondary | ICD-10-CM | POA: Diagnosis not present

## 2021-12-06 DIAGNOSIS — A419 Sepsis, unspecified organism: Secondary | ICD-10-CM | POA: Diagnosis not present

## 2021-12-06 DIAGNOSIS — Z7984 Long term (current) use of oral hypoglycemic drugs: Secondary | ICD-10-CM | POA: Diagnosis not present

## 2021-12-06 DIAGNOSIS — Z20822 Contact with and (suspected) exposure to covid-19: Secondary | ICD-10-CM | POA: Diagnosis not present

## 2021-12-06 DIAGNOSIS — Z9181 History of falling: Secondary | ICD-10-CM

## 2021-12-06 DIAGNOSIS — Z743 Need for continuous supervision: Secondary | ICD-10-CM | POA: Diagnosis not present

## 2021-12-06 DIAGNOSIS — R531 Weakness: Secondary | ICD-10-CM | POA: Diagnosis not present

## 2021-12-06 DIAGNOSIS — N179 Acute kidney failure, unspecified: Secondary | ICD-10-CM | POA: Diagnosis present

## 2021-12-06 LAB — COMPREHENSIVE METABOLIC PANEL
ALT: 19 U/L (ref 0–53)
AST: 15 U/L (ref 0–37)
Albumin: 4.3 g/dL (ref 3.5–5.2)
Alkaline Phosphatase: 71 U/L (ref 39–117)
BUN: 36 mg/dL — ABNORMAL HIGH (ref 6–23)
CO2: 22 mEq/L (ref 19–32)
Calcium: 9.3 mg/dL (ref 8.4–10.5)
Chloride: 102 mEq/L (ref 96–112)
Creatinine, Ser: 2.1 mg/dL — ABNORMAL HIGH (ref 0.40–1.50)
GFR: 31.97 mL/min — ABNORMAL LOW (ref >60.00)
Glucose, Bld: 224 mg/dL — ABNORMAL HIGH (ref 70–99)
Potassium: 5.1 mEq/L (ref 3.5–5.1)
Sodium: 136 mEq/L (ref 135–145)
Total Bilirubin: 1.1 mg/dL (ref 0.2–1.2)
Total Protein: 7.6 g/dL (ref 6.0–8.3)

## 2021-12-06 LAB — CBC WITH DIFFERENTIAL/PLATELET
Basophils Absolute: 0.1 10*3/uL (ref 0.0–0.1)
Basophils Relative: 0.3 % (ref 0.0–3.0)
Eosinophils Absolute: 0 10*3/uL (ref 0.0–0.7)
Eosinophils Relative: 0 % (ref 0.0–5.0)
HCT: 41.8 % (ref 39.0–52.0)
Hemoglobin: 13.6 g/dL (ref 13.0–17.0)
Lymphocytes Relative: 2.3 % — ABNORMAL LOW (ref 12.0–46.0)
Lymphs Abs: 0.4 10*3/uL — ABNORMAL LOW (ref 0.7–4.0)
MCHC: 32.7 g/dL (ref 30.0–36.0)
MCV: 91.8 fl (ref 78.0–100.0)
Monocytes Absolute: 0.8 10*3/uL (ref 0.1–1.0)
Monocytes Relative: 4.6 % (ref 3.0–12.0)
Neutro Abs: 16.4 10*3/uL — ABNORMAL HIGH (ref 1.4–7.7)
Neutrophils Relative %: 92.8 % — ABNORMAL HIGH (ref 43.0–77.0)
Platelets: 199 10*3/uL (ref 150.0–400.0)
RBC: 4.55 Mil/uL (ref 4.22–5.81)
RDW: 14.2 % (ref 11.5–15.5)
WBC: 17.7 10*3/uL — ABNORMAL HIGH (ref 4.0–10.5)

## 2021-12-06 MED ORDER — LACTATED RINGERS IV BOLUS (SEPSIS)
500.0000 mL | Freq: Once | INTRAVENOUS | Status: AC
  Administered 2021-12-06: 500 mL via INTRAVENOUS

## 2021-12-06 MED ORDER — VANCOMYCIN HCL 2000 MG/400ML IV SOLN
2000.0000 mg | Freq: Once | INTRAVENOUS | Status: AC
  Administered 2021-12-07: 2000 mg via INTRAVENOUS
  Filled 2021-12-06: qty 400

## 2021-12-06 MED ORDER — LACTATED RINGERS IV BOLUS (SEPSIS)
1000.0000 mL | Freq: Once | INTRAVENOUS | Status: AC
  Administered 2021-12-06: 1000 mL via INTRAVENOUS

## 2021-12-06 MED ORDER — CEPHALEXIN 500 MG PO CAPS: 500.0000 mg | ORAL_CAPSULE | Freq: Four times a day (QID) | ORAL | 0 refills | Status: DC

## 2021-12-06 MED ORDER — SODIUM CHLORIDE 0.9 % IV SOLN
2.0000 g | Freq: Once | INTRAVENOUS | Status: AC
  Administered 2021-12-06: 2 g via INTRAVENOUS
  Filled 2021-12-06: qty 20

## 2021-12-06 NOTE — Progress Notes (Signed)
Acute Office Visit  Subjective:    Patient ID: [name], male    DOB: [birth year], 66 y.o.   MRN: [id number]  Chief Complaint  Patient presents with   Follow-up    Pt c/o dizziness, shaking uncontrollably, and sensitivity in both legs. Pt denies feeling numbness in legs.     HPI Patient is in today for shaking and sensitivity in his legs.  He went to sleep last night, and had a hard time falling asleep. He started getting shaky in his legs and arms. Then he started noticing that his right leg is sensitive to touch/painful. Also endorses, an un-equilibrium feeling, not dizziness. He checked his blood sugar around 3am and it was 300. Then at 10am it was 211 which is a little high for him. He did not eat breakfast or take his losartan today.   Past Medical History:  Diagnosis Date   Arthritis    Chronic kidney disease    reduced kidney function. Stage III   Diabetes mellitus without complication (HCC)    DVT (deep venous thrombosis) [facility])    age indeterminate LLE DVT 02/09/20   Dyspnea    On exertion   History of kidney stones    History of pulmonary embolus (PE)    Hypertension    Lipidemia    Prostate enlargement    Pulmonary embolism ([place]) 2009   Sleep apnea     Past Surgical History:  Procedure Laterality Date   CYSTOSCOPY W/ URETERAL STENT PLACEMENT Left 10/08/2020   Procedure: CYSTOSCOPY WITH RETROGRADE PYELOGRAM/URETERAL STENT PLACEMENT;  Surgeon: Irine Seal, MD;  Location: [facility];  Service: Urology;  Laterality: Left;   CYSTOSCOPY/URETEROSCOPY/HOLMIUM LASER/STENT PLACEMENT Right 06/07/2020   Procedure: CYSTOSCOPY/URETEROSCOPY/HOLMIUM LASER/STENT PLACEMENT;  Surgeon: Lucas Mallow, MD;  [facility];  Service: Urology;  Laterality: Right;   CYSTOSCOPY/URETEROSCOPY/HOLMIUM LASER/STENT PLACEMENT Left 10/19/2020   Procedure: CYSTOSCOPY LEFT URETEROSCOPY/HOLMIUM LASER/STENT EXCHANGE;  Surgeon: Irine Seal, MD;  [facility];  Service: Urology;  Laterality: Left;    TOTAL KNEE ARTHROPLASTY Left 08/06/2021   Procedure: LEFT TOTAL KNEE ARTHROPLASTY;  Surgeon: Leandrew Koyanagi, MD;  Location: [facility] [place];  Service: Orthopedics;  Laterality: Left;    Family History  Problem Relation Age of Onset   Cancer Mother    Heart disease Father     Social History   Socioeconomic History   Marital status: Significant Other    Spouse name: Not on file   Number of children: Not on file   Years of education: Not on file   Highest education level: Not on file  Occupational History   Occupation: retired  Tobacco Use   Smoking status: Never   Smokeless tobacco: Never  Vaping Use   Vaping Use: Never used  Substance and Sexual Activity   Alcohol use: Yes    Alcohol/week: 1.0 standard drink    Types: 1 Cans of beer per week    Comment: social   Drug use: Never   Sexual activity: Yes  Other Topics Concern   Not on file  Social History Narrative   Lives with Friend   Right handed   Drinks 1-2 cups caffeine daily   Social Determinants of Health   Financial Resource Strain: Not on file  Food Insecurity: Not on file  Transportation Needs: Not on file  Physical Activity: Not on file  Stress: Not on file  Social Connections: Not on file  Intimate Partner Violence: Not on file    Outpatient Medications Prior  to Visit  Medication Sig Dispense Refill   ACCU-CHEK GUIDE test strip USE TO CHECK BLOOD SUGAR 3 TIMES DAILY.E11.65 100 strip 12   Accu-Chek Softclix Lancets lancets Use as instructed to check blood sugar 3 times daily Dx is E11.65 100 each 2   amLODipine (NORVASC) 10 MG tablet Take 1 tablet (10 mg total) by mouth daily. 90 tablet 3   aspirin EC 81 MG tablet Take 81 mg by mouth daily.     BD INSULIN SYRINGE U/F 31G X 5/16" 1 ML MISC USE AS DIRECTED 100 each 11   Blood Glucose Monitoring Suppl (ACCU-CHEK AVIVA) device by Other route. Use as instructed     diclofenac Sodium (VOLTAREN) 1 % GEL Apply 1 application topically 4 (four) times daily as needed  (pain).     docusate sodium (COLACE) 100 MG capsule Take 1 capsule (100 mg total) by mouth daily as needed. (Patient not taking: No sig reported) 30 capsule 2   Dulaglutide (TRULICITY) 1.5 OB/0.9GG SOPN Inject 1.5 mg into the skin once a week. 6 mL 3   gabapentin (NEURONTIN) 300 MG capsule TAKE 1 CAPSULE BY MOUTH FOUR TIMES A DAY 120 capsule 0   insulin NPH-regular Human (HUMULIN 70/30) (70-30) 100 UNIT/ML injection Max daily up to 90 mL 90 mL 3   Insulin Pen Needle 32G X 4 MM MISC 1 Device by Does not apply route in the morning and at bedtime. 200 each 3   Insulin Syringes, Disposable, U-100 1 ML MISC 1 Device by Does not apply route as directed. 100 each 11   losartan (COZAAR) 50 MG tablet TAKE 1 TABLET BY MOUTH EVERY DAY 90 tablet 3   lovastatin (MEVACOR) 40 MG tablet TAKE 1 TABLET (40 MG TOTAL) BY MOUTH AT BEDTIME. **NEEDS APPT BEFORE NEXT REFILL** 90 tablet 0   methocarbamol (ROBAXIN) 500 MG tablet Take 1 tablet (500 mg total) by mouth 2 (two) times daily as needed. To be taken after surgery (Patient not taking: No sig reported) 20 tablet 2   montelukast (SINGULAIR) 10 MG tablet Take 1 tablet (10 mg total) by mouth at bedtime. 90 tablet 1   ondansetron (ZOFRAN) 4 MG tablet Take 1 tablet (4 mg total) by mouth every 8 (eight) hours as needed for nausea or vomiting. 40 tablet 0   oxyCODONE-acetaminophen (PERCOCET) 5-325 MG tablet Take 1-2 tablets by mouth every 6 (six) hours as needed. To be taken after surgery 40 tablet 0   warfarin (COUMADIN) 5 MG tablet TAKE 1 TABLET (5 MG TOTAL) BY MOUTH DAILY. TAKE AS DIRECTED. 90 tablet 2   cephALEXin (KEFLEX) 500 MG capsule Take 1 capsule (500 mg total) by mouth 4 (four) times daily. 40 capsule 0   No facility-administered medications prior to visit.    No Known Allergies  Review of Systems  Constitutional:  Positive for chills.  HENT:  Positive for congestion and postnasal drip.   Respiratory: Negative.    Cardiovascular: Negative.    Gastrointestinal: Negative.   Genitourinary: Negative.   Skin:        Right leg sensitive, red, swollen  Neurological:  Positive for dizziness ("disequilibrium").      Objective:    Physical Exam Vitals and nursing note reviewed.  Constitutional:      Appearance: Normal appearance.  HENT:     Head: Normocephalic.  Eyes:     Conjunctiva/sclera: Conjunctivae normal.  Cardiovascular:     Rate and Rhythm: Normal rate and regular rhythm.     Pulses:  Normal pulses.     Heart sounds: Normal heart sounds.  Pulmonary:     Effort: Pulmonary effort is normal.     Breath sounds: Normal breath sounds.  Musculoskeletal:     Cervical back: Normal range of motion.     Right lower leg: Edema (2+ edema) present.     Left lower leg: No edema.  Skin:    General: Skin is warm.     Findings: Erythema present.     Comments: Edema, redness, warmth, and some drainage to right anterior shin  Neurological:     General: No focal deficit present.     Mental Status: He is alert and oriented to person, place, and time.  Psychiatric:        Mood and Affect: Mood normal.        Behavior: Behavior normal.        Thought Content: Thought content normal.        Judgment: Judgment normal.    BP (!) 122/58 (BP Location: Left Arm, Patient Position: Sitting, Cuff Size: Normal)    Pulse 72    Temp (!) 96.1 F (35.6 C) (Temporal)    Ht 6\' 1"  (1.854 m)    Wt 239 lb (108.4 kg)    SpO2 91%    BMI 31.53 kg/m  Wt Readings from Last 3 Encounters:  12/06/21 239 lb (108.4 kg)  09/13/21 228 lb (103.4 kg)  08/29/21 229 lb (103.9 kg)    Health Maintenance Due  Topic Date Due   Hepatitis C Screening  Never done   TETANUS/TDAP  Never done   COLONOSCOPY (Pts 45-33yrs Insurance coverage will need to be confirmed)  Never done   Zoster Vaccines- Shingrix (1 of 2) Never done   FOOT EXAM  02/22/2021   INFLUENZA VACCINE  06/04/2021   Pneumonia Vaccine 57+ Years old (2 - PCV) 06/08/2021    There are no preventive  care reminders to display for this patient.   Lab Results  Component Value Date   TSH 1.344 02/09/2020   Lab Results  Component Value Date   WBC 7.6 08/07/2021   HGB 13.4 08/07/2021   HCT 41.4 08/07/2021   MCV 92.4 08/07/2021   PLT 155 08/07/2021   Lab Results  Component Value Date   NA 134 (L) 08/07/2021   K 5.6 (H) 08/07/2021   CO2 26 08/07/2021   GLUCOSE 222 (H) 08/07/2021   BUN 22 08/07/2021   CREATININE 1.77 (H) 08/07/2021   BILITOT 0.7 08/02/2021   ALKPHOS 60 08/02/2021   AST 20 08/02/2021   ALT 24 08/02/2021   PROT 7.4 08/02/2021   ALBUMIN 3.9 08/02/2021   CALCIUM 8.4 (L) 08/07/2021   ANIONGAP 8 08/07/2021   GFR 35.92 (L) 08/28/2020   Lab Results  Component Value Date   CHOL 156 12/22/2019   Lab Results  Component Value Date   HDL 41.40 12/22/2019   No results found for: [facility] Lab Results  Component Value Date   TRIG 251.0 (H) 12/22/2019   Lab Results  Component Value Date   CHOLHDL 4 12/22/2019   Lab Results  Component Value Date   HGBA1C 6.0 (A) 08/29/2021       Assessment & Plan:   Problem List Items Addressed This Visit       Other   Cellulitis of right lower extremity - Primary    Redness, warmth and edema to right shin. Will start keflex QID x 10 days. Check stat ultrasound to rule out  DVT. With shaking and disequilibrium, will check CMP and CBC. Discussed ER precautions. Follow up in 1 week.       Relevant Orders   CBC with Differential/Platelet   Comprehensive metabolic panel   Other Visit Diagnoses     Leg edema, right       He currently takes coumadin, but last check not therapetuic range. With new onset swelling in left leg, will check stat ultrasound to r/o DVT.    Relevant Orders   CBC with Differential/Platelet   Comprehensive metabolic panel   VAS [place] LOWER EXTREMITY VENOUS (DVT)        Meds ordered this encounter  Medications   cephALEXin (KEFLEX) 500 MG capsule    Sig: Take 1 capsule (500 mg total) by mouth  4 (four) times daily.    Dispense:  40 capsule    Refill:  0   A total of 30 minutes were spent on this encounter today. When total time is documented, this includes both the face-to-face and non-face-to-face time personally spent before, during and after the visit on the date of the encounter.   Charyl Dancer, NP

## 2021-12-06 NOTE — Assessment & Plan Note (Addendum)
Redness, warmth and edema to right shin. Will start keflex QID x 10 days. Check stat ultrasound to rule out DVT. With shaking and disequilibrium, will check CMP and CBC. Discussed ER precautions. Follow up in 1 week.

## 2021-12-06 NOTE — ED Triage Notes (Signed)
Patient arrives from home with c/o sepsis. Pt diagnosed with cellulitis yesterday. EMS reports hypotension upon arrival. 1250 mg tylenol taken  9 pm. 700 cc NS given en route, 18 g LH  EMS vitals: BP 95/60 RR 24 HR 150

## 2021-12-06 NOTE — Patient Instructions (Signed)
It was great to see you!  Start keflex 4 times a day for 10 days. We are checking some lab work today and an ultrasound of your right leg.   Let's follow-up in 1 week, sooner if you have concerns.  If a referral was placed today, you will be contacted for an appointment. Please note that routine referrals can sometimes take up to 3-4 weeks to process. Please call our office if you haven't heard anything after this time frame.  Take care,  Vance Peper, NP

## 2021-12-06 NOTE — Patient Instructions (Signed)
[name], thank you for joining [name], PA-C for today's virtual visit.  While this provider is not your primary care provider (PCP), if your PCP is located in our provider database this encounter information will be shared with them immediately following your visit.  Consent: (Patient) [name] provided verbal consent for this virtual visit at the beginning of the encounter.  Current Medications:  Current Outpatient Medications:    ACCU-CHEK GUIDE test strip, USE TO CHECK BLOOD SUGAR 3 TIMES DAILY.E11.65, Disp: 100 strip, Rfl: 12   Accu-Chek Softclix Lancets lancets, Use as instructed to check blood sugar 3 times daily Dx is E11.65, Disp: 100 each, Rfl: 2   amLODipine (NORVASC) 10 MG tablet, Take 1 tablet (10 mg total) by mouth daily., Disp: 90 tablet, Rfl: 3   aspirin EC 81 MG tablet, Take 81 mg by mouth daily., Disp: , Rfl:    BD INSULIN SYRINGE U/F 31G X 5/16" 1 ML MISC, USE AS DIRECTED, Disp: 100 each, Rfl: 11   Blood Glucose Monitoring Suppl (ACCU-CHEK AVIVA) device, by Other route. Use as instructed, Disp: , Rfl:    cephALEXin (KEFLEX) 500 MG capsule, Take 1 capsule (500 mg total) by mouth 4 (four) times daily., Disp: 40 capsule, Rfl: 0   diclofenac Sodium (VOLTAREN) 1 % GEL, Apply 1 application topically 4 (four) times daily as needed (pain)., Disp: , Rfl:    docusate sodium (COLACE) 100 MG capsule, Take 1 capsule (100 mg total) by mouth daily as needed. (Patient not taking: No sig reported), Disp: 30 capsule, Rfl: 2   Dulaglutide (TRULICITY) 1.5 BZ/1.6RC SOPN, Inject 1.5 mg into the skin once a week., Disp: 6 mL, Rfl: 3   gabapentin (NEURONTIN) 300 MG capsule, TAKE 1 CAPSULE BY MOUTH FOUR TIMES A DAY, Disp: 120 capsule, Rfl: 0   insulin NPH-regular Human (HUMULIN 70/30) (70-30) 100 UNIT/ML injection, Max daily up to 90 mL, Disp: 90 mL, Rfl: 3   Insulin Pen Needle 32G X 4 MM MISC, 1 Device by Does not apply route in the morning and at bedtime., Disp: 200 each, Rfl:  3   Insulin Syringes, Disposable, U-100 1 ML MISC, 1 Device by Does not apply route as directed., Disp: 100 each, Rfl: 11   losartan (COZAAR) 50 MG tablet, TAKE 1 TABLET BY MOUTH EVERY DAY, Disp: 90 tablet, Rfl: 3   lovastatin (MEVACOR) 40 MG tablet, TAKE 1 TABLET (40 MG TOTAL) BY MOUTH AT BEDTIME. **NEEDS APPT BEFORE NEXT REFILL**, Disp: 90 tablet, Rfl: 0   methocarbamol (ROBAXIN) 500 MG tablet, Take 1 tablet (500 mg total) by mouth 2 (two) times daily as needed. To be taken after surgery (Patient not taking: No sig reported), Disp: 20 tablet, Rfl: 2   montelukast (SINGULAIR) 10 MG tablet, Take 1 tablet (10 mg total) by mouth at bedtime., Disp: 90 tablet, Rfl: 1   ondansetron (ZOFRAN) 4 MG tablet, Take 1 tablet (4 mg total) by mouth every 8 (eight) hours as needed for nausea or vomiting., Disp: 40 tablet, Rfl: 0   oxyCODONE-acetaminophen (PERCOCET) 5-325 MG tablet, Take 1-2 tablets by mouth every 6 (six) hours as needed. To be taken after surgery, Disp: 40 tablet, Rfl: 0   warfarin (COUMADIN) 5 MG tablet, TAKE 1 TABLET (5 MG TOTAL) BY MOUTH DAILY. TAKE AS DIRECTED., Disp: 90 tablet, Rfl: 2   Medications ordered in this encounter:  No orders of the defined types were placed in this encounter.    *If you need refills on other  medications prior to your next appointment, please contact your pharmacy*  Follow-Up: Call back or seek an in-person evaluation if the symptoms worsen or if the condition fails to improve as anticipated.  Other Instructions Call your PCP office as soon as they open to see about getting appointment this morning, just so you can get needed examination.  They can swab you for both flu and COVID at that time and start proper treatment, as long as they do not feel any further evaluation is necessary.  As discussed, if anything worsens, you need evaluation at the emergency room.  Please do not delay care.   If you have been instructed to have an in-person evaluation today at a  local Urgent Care facility, please use the link below. It will take you to a list of all of our available [facility], including address, phone number and hours of operation. Please do not delay care.  [facility]  If you or a family member do not have a primary care provider, use the link below to schedule a visit and establish care. When you choose a [facility] primary care physician or advanced practice provider, you gain a long-term partner in health. Find a Primary Care Provider  Learn more about [facility]'s in-office and virtual care options: [facility]

## 2021-12-06 NOTE — Progress Notes (Signed)
A consult was received from an ED physician for Vancomycin per pharmacy dosing.  The patient's profile has been reviewed for ht/wt/allergies/indication/available labs.   A one time order has been placed for Vancomycin 2gm IV.  Further antibiotics/pharmacy consults should be ordered by admitting physician if indicated.                       Thank you, Netta Cedars PharmD 12/06/2021  11:54 PM

## 2021-12-06 NOTE — Progress Notes (Signed)
Virtual Visit Consent   [name], you are scheduled for a virtual visit with a [facility] provider today.     Just as with appointments in the office, your consent must be obtained to participate.  Your consent will be active for this visit and any virtual visit you may have with one of our providers in the next 365 days.     If you have a MyChart account, a copy of this consent can be sent to you electronically.  All virtual visits are billed to your insurance company just like a traditional visit in the office.    As this is a virtual visit, video technology does not allow for your provider to perform a traditional examination.  This may limit your provider's ability to fully assess your condition.  If your provider identifies any concerns that need to be evaluated in person or the need to arrange testing (such as labs, EKG, etc.), we will make arrangements to do so.     Although advances in technology are sophisticated, we cannot ensure that it will always work on either your end or our end.  If the connection with a video visit is poor, the visit may have to be switched to a telephone visit.  With either a video or telephone visit, we are not always able to ensure that we have a secure connection.     I need to obtain your verbal consent now.   Are you willing to proceed with your visit today?    Ivor Kishi has provided verbal consent on 12/06/2021 for a virtual visit (video or telephone).   [name], [place]   Date: 12/06/2021 7:48 AM   Virtual Visit via Video Note   I, [name], connected with  [name]  ([id number], [birth year]) on 12/06/21 at  7:45 AM EST by a video-enabled telemedicine application and verified that I am speaking with the correct person using two identifiers.  Location: Patient: Virtual Visit Location Patient: Home Provider: Virtual Visit Location Provider: Home   I discussed the limitations of evaluation and management by telemedicine and  the availability of in person appointments. The patient expressed understanding and agreed to proceed.    History of Present Illness: [name] is a 66 y.o. who identifies as a male who was assigned male at birth, and is being seen today for sudden onset of body aches, chills and fever with T-max 101, around 3 AM this morning.  Has noted some runny nose and nasal congestion, but nothing "out of the ordinary" for him.  Denies any recent travel or sick contact.  Has noticed sensation of dizziness and just feeling "off balance".  Denies chest pain, racing heart, shortness of breath or lightheadedness.  Does have substantial medical history and is high fall risk.  Currently on warfarin with most recent INR at 1.8 (subtherapeutic).  Has taken 2 extra strength Tylenol this morning.  No other medications.  HPI: HPI  Problems:  Patient Active Problem List   Diagnosis Date Noted   Status post total left knee replacement 08/06/2021   Encounter for medication monitoring 07/10/2021   UTI (urinary tract infection) 10/09/2020   Acute UTI 10/08/2020   Ureterolithiasis    Gross hematuria 06/16/2020   Renal lithiasis 06/07/2020   Renal stone 06/06/2020   Type 2 diabetes mellitus with stage 3b chronic kidney disease, with long-term current use of insulin ([place]) 05/26/2020   Visual disturbance 03/30/2020   Chronic pain of left knee 03/30/2020  Snores 03/30/2020   Chronic kidney disease, stage 3b ([place]) 03/30/2020   Midline low back pain without sciatica 03/30/2020   Hospital discharge follow-up 02/25/2020   Type 2 diabetes mellitus with diabetic polyneuropathy, with long-term current use of insulin ([place]) 02/23/2020   Syncope 02/08/2020   Renal stones 01/30/2020   Diminished pulses in lower extremity 01/26/2020   AKI (acute kidney injury) ([place]) 01/26/2020   CKD (chronic kidney disease) stage 4, GFR 15-29 ml/min (HCC) 01/05/2020   Hyperkalemia 01/05/2020   Long term (current) use of anticoagulants  12/22/2019   Elevated cholesterol 12/17/2019   Primary osteoarthritis of left knee 12/17/2019   Arthritis 12/17/2019   Type 2 diabetes mellitus with hyperglycemia, with long-term current use of insulin ([place]) 12/17/2019   History of pulmonary embolism 12/17/2019   Essential hypertension 12/17/2019    Allergies: No Known Allergies Medications:  Current Outpatient Medications:    ACCU-CHEK GUIDE test strip, USE TO CHECK BLOOD SUGAR 3 TIMES DAILY.E11.65, Disp: 100 strip, Rfl: 12   Accu-Chek Softclix Lancets lancets, Use as instructed to check blood sugar 3 times daily Dx is E11.65, Disp: 100 each, Rfl: 2   amLODipine (NORVASC) 10 MG tablet, Take 1 tablet (10 mg total) by mouth daily., Disp: 90 tablet, Rfl: 3   aspirin EC 81 MG tablet, Take 81 mg by mouth daily., Disp: , Rfl:    BD INSULIN SYRINGE U/F 31G X 5/16" 1 ML MISC, USE AS DIRECTED, Disp: 100 each, Rfl: 11   Blood Glucose Monitoring Suppl (ACCU-CHEK AVIVA) device, by Other route. Use as instructed, Disp: , Rfl:    cephALEXin (KEFLEX) 500 MG capsule, Take 1 capsule (500 mg total) by mouth 4 (four) times daily., Disp: 40 capsule, Rfl: 0   diclofenac Sodium (VOLTAREN) 1 % GEL, Apply 1 application topically 4 (four) times daily as needed (pain)., Disp: , Rfl:    docusate sodium (COLACE) 100 MG capsule, Take 1 capsule (100 mg total) by mouth daily as needed. (Patient not taking: No sig reported), Disp: 30 capsule, Rfl: 2   Dulaglutide (TRULICITY) 1.5 TZ/0.0FV SOPN, Inject 1.5 mg into the skin once a week., Disp: 6 mL, Rfl: 3   gabapentin (NEURONTIN) 300 MG capsule, TAKE 1 CAPSULE BY MOUTH FOUR TIMES A DAY, Disp: 120 capsule, Rfl: 0   insulin NPH-regular Human (HUMULIN 70/30) (70-30) 100 UNIT/ML injection, Max daily up to 90 mL, Disp: 90 mL, Rfl: 3   Insulin Pen Needle 32G X 4 MM MISC, 1 Device by Does not apply route in the morning and at bedtime., Disp: 200 each, Rfl: 3   Insulin Syringes, Disposable, U-100 1 ML MISC, 1 Device by Does not  apply route as directed., Disp: 100 each, Rfl: 11   losartan (COZAAR) 50 MG tablet, TAKE 1 TABLET BY MOUTH EVERY DAY, Disp: 90 tablet, Rfl: 3   lovastatin (MEVACOR) 40 MG tablet, TAKE 1 TABLET (40 MG TOTAL) BY MOUTH AT BEDTIME. **NEEDS APPT BEFORE NEXT REFILL**, Disp: 90 tablet, Rfl: 0   methocarbamol (ROBAXIN) 500 MG tablet, Take 1 tablet (500 mg total) by mouth 2 (two) times daily as needed. To be taken after surgery (Patient not taking: No sig reported), Disp: 20 tablet, Rfl: 2   montelukast (SINGULAIR) 10 MG tablet, Take 1 tablet (10 mg total) by mouth at bedtime., Disp: 90 tablet, Rfl: 1   ondansetron (ZOFRAN) 4 MG tablet, Take 1 tablet (4 mg total) by mouth every 8 (eight) hours as needed for nausea or vomiting., Disp: 40 tablet, Rfl:  0   oxyCODONE-acetaminophen (PERCOCET) 5-325 MG tablet, Take 1-2 tablets by mouth every 6 (six) hours as needed. To be taken after surgery, Disp: 40 tablet, Rfl: 0   warfarin (COUMADIN) 5 MG tablet, TAKE 1 TABLET (5 MG TOTAL) BY MOUTH DAILY. TAKE AS DIRECTED., Disp: 90 tablet, Rfl: 2  Observations/Objective: Patient is well-developed, well-nourished in no acute distress.  Resting comfortably at home.  Head is normocephalic, atraumatic.  No labored breathing. Speech is clear and coherent with logical content.  Patient is alert and oriented at baseline.   Assessment and Plan: 1. Flu-like symptoms  2. Dizziness  3. At high risk for falls  Given abrupt onset of symptoms with fever, chills and body aches, biggest concern is for influenza.  However given his significant medical history, he does need to have COVID ruled out as the treatments are different and he would need appropriate antiviral.  Discussed giving his sensation of dizziness and being off balance, in combination with his significant medical history and subtherapeutic INR, he needs an evaluation in person.  Neurological system is grossly intact.  Unable to visualize him fully ambulating, but he  denies any issue with this this morning.  Would recommend evaluation with PCP or urgent care first thing this morning.  Strict ER precautions reviewed with patient and wife who voiced understanding and agreement.  No charge for this video visit since he has been triaged for an in person evaluation.  Follow Up Instructions: I discussed the assessment and treatment plan with the patient. The patient was provided an opportunity to ask questions and all were answered. The patient agreed with the plan and demonstrated an understanding of the instructions.  A copy of instructions were sent to the patient via MyChart unless otherwise noted below.   The patient was advised to call back or seek an in-person evaluation if the symptoms worsen or if the condition fails to improve as anticipated.  Time:  I spent 10 minutes with the patient via telehealth technology discussing the above problems/concerns.    [name] Rio, PA-C

## 2021-12-07 ENCOUNTER — Inpatient Hospital Stay (HOSPITAL_COMMUNITY): Payer: Medicare Other

## 2021-12-07 ENCOUNTER — Emergency Department (HOSPITAL_COMMUNITY): Payer: Medicare Other

## 2021-12-07 DIAGNOSIS — Z794 Long term (current) use of insulin: Secondary | ICD-10-CM

## 2021-12-07 DIAGNOSIS — Z86711 Personal history of pulmonary embolism: Secondary | ICD-10-CM

## 2021-12-07 DIAGNOSIS — L03115 Cellulitis of right lower limb: Secondary | ICD-10-CM

## 2021-12-07 DIAGNOSIS — Z8249 Family history of ischemic heart disease and other diseases of the circulatory system: Secondary | ICD-10-CM | POA: Diagnosis not present

## 2021-12-07 DIAGNOSIS — A419 Sepsis, unspecified organism: Secondary | ICD-10-CM | POA: Diagnosis present

## 2021-12-07 DIAGNOSIS — I129 Hypertensive chronic kidney disease with stage 1 through stage 4 chronic kidney disease, or unspecified chronic kidney disease: Secondary | ICD-10-CM | POA: Diagnosis not present

## 2021-12-07 DIAGNOSIS — D649 Anemia, unspecified: Secondary | ICD-10-CM | POA: Diagnosis not present

## 2021-12-07 DIAGNOSIS — E1142 Type 2 diabetes mellitus with diabetic polyneuropathy: Secondary | ICD-10-CM | POA: Diagnosis not present

## 2021-12-07 DIAGNOSIS — E872 Acidosis, unspecified: Secondary | ICD-10-CM | POA: Diagnosis present

## 2021-12-07 DIAGNOSIS — N170 Acute kidney failure with tubular necrosis: Secondary | ICD-10-CM | POA: Diagnosis present

## 2021-12-07 DIAGNOSIS — L0390 Cellulitis, unspecified: Secondary | ICD-10-CM

## 2021-12-07 DIAGNOSIS — Z7984 Long term (current) use of oral hypoglycemic drugs: Secondary | ICD-10-CM | POA: Diagnosis not present

## 2021-12-07 DIAGNOSIS — N1832 Chronic kidney disease, stage 3b: Secondary | ICD-10-CM | POA: Diagnosis not present

## 2021-12-07 DIAGNOSIS — Z9114 Patient's other noncompliance with medication regimen: Secondary | ICD-10-CM | POA: Diagnosis not present

## 2021-12-07 DIAGNOSIS — I10 Essential (primary) hypertension: Secondary | ICD-10-CM

## 2021-12-07 DIAGNOSIS — N184 Chronic kidney disease, stage 4 (severe): Secondary | ICD-10-CM

## 2021-12-07 DIAGNOSIS — Z79899 Other long term (current) drug therapy: Secondary | ICD-10-CM | POA: Diagnosis not present

## 2021-12-07 DIAGNOSIS — Z20822 Contact with and (suspected) exposure to covid-19: Secondary | ICD-10-CM | POA: Diagnosis present

## 2021-12-07 DIAGNOSIS — R600 Localized edema: Secondary | ICD-10-CM | POA: Diagnosis not present

## 2021-12-07 DIAGNOSIS — R000 Tachycardia, unspecified: Secondary | ICD-10-CM | POA: Diagnosis not present

## 2021-12-07 DIAGNOSIS — R6520 Severe sepsis without septic shock: Secondary | ICD-10-CM | POA: Diagnosis present

## 2021-12-07 DIAGNOSIS — G4730 Sleep apnea, unspecified: Secondary | ICD-10-CM | POA: Diagnosis present

## 2021-12-07 DIAGNOSIS — Z96652 Presence of left artificial knee joint: Secondary | ICD-10-CM | POA: Diagnosis present

## 2021-12-07 DIAGNOSIS — N179 Acute kidney failure, unspecified: Secondary | ICD-10-CM | POA: Diagnosis not present

## 2021-12-07 DIAGNOSIS — D72829 Elevated white blood cell count, unspecified: Secondary | ICD-10-CM | POA: Diagnosis not present

## 2021-12-07 DIAGNOSIS — Z7901 Long term (current) use of anticoagulants: Secondary | ICD-10-CM | POA: Diagnosis not present

## 2021-12-07 DIAGNOSIS — Z86718 Personal history of other venous thrombosis and embolism: Secondary | ICD-10-CM | POA: Diagnosis not present

## 2021-12-07 DIAGNOSIS — E785 Hyperlipidemia, unspecified: Secondary | ICD-10-CM | POA: Diagnosis present

## 2021-12-07 DIAGNOSIS — E1122 Type 2 diabetes mellitus with diabetic chronic kidney disease: Secondary | ICD-10-CM | POA: Diagnosis present

## 2021-12-07 LAB — CBC
HCT: 35.5 % — ABNORMAL LOW (ref 39.0–52.0)
Hemoglobin: 11.4 g/dL — ABNORMAL LOW (ref 13.0–17.0)
MCH: 30.5 pg (ref 26.0–34.0)
MCHC: 32.1 g/dL (ref 30.0–36.0)
MCV: 94.9 fL (ref 80.0–100.0)
Platelets: 127 10*3/uL — ABNORMAL LOW (ref 150–400)
RBC: 3.74 MIL/uL — ABNORMAL LOW (ref 4.22–5.81)
RDW: 14 % (ref 11.5–15.5)
WBC: 17.4 10*3/uL — ABNORMAL HIGH (ref 4.0–10.5)
nRBC: 0 % (ref 0.0–0.2)

## 2021-12-07 LAB — PROTIME-INR
INR: 1.6 — ABNORMAL HIGH (ref 0.8–1.2)
INR: 1.6 — ABNORMAL HIGH (ref 0.8–1.2)
INR: 1.7 — ABNORMAL HIGH (ref 0.8–1.2)
Prothrombin Time: 19.4 seconds — ABNORMAL HIGH (ref 11.4–15.2)
Prothrombin Time: 19.5 seconds — ABNORMAL HIGH (ref 11.4–15.2)
Prothrombin Time: 20.1 seconds — ABNORMAL HIGH (ref 11.4–15.2)

## 2021-12-07 LAB — URINALYSIS, ROUTINE W REFLEX MICROSCOPIC
Bilirubin Urine: NEGATIVE
Glucose, UA: NEGATIVE mg/dL
Ketones, ur: NEGATIVE mg/dL
Leukocytes,Ua: NEGATIVE
Nitrite: NEGATIVE
Protein, ur: NEGATIVE mg/dL
Specific Gravity, Urine: <1.005 — ABNORMAL LOW (ref 1.005–1.030)
pH: 5.5 (ref 5.0–8.0)

## 2021-12-07 LAB — BASIC METABOLIC PANEL
Anion gap: 10 (ref 5–15)
BUN: 43 mg/dL — ABNORMAL HIGH (ref 8–23)
CO2: 17 mmol/L — ABNORMAL LOW (ref 22–32)
Calcium: 7.6 mg/dL — ABNORMAL LOW (ref 8.9–10.3)
Chloride: 105 mmol/L (ref 98–111)
Creatinine, Ser: 2.34 mg/dL — ABNORMAL HIGH (ref 0.61–1.24)
GFR, Estimated: 30 mL/min — ABNORMAL LOW (ref >60)
Glucose, Bld: 195 mg/dL — ABNORMAL HIGH (ref 70–99)
Potassium: 3.9 mmol/L (ref 3.5–5.1)
Sodium: 132 mmol/L — ABNORMAL LOW (ref 135–145)

## 2021-12-07 LAB — COMPREHENSIVE METABOLIC PANEL
ALT: 21 U/L (ref 0–44)
AST: 21 U/L (ref 15–41)
Albumin: 3.3 g/dL — ABNORMAL LOW (ref 3.5–5.0)
Alkaline Phosphatase: 50 U/L (ref 38–126)
Anion gap: 10 (ref 5–15)
BUN: 43 mg/dL — ABNORMAL HIGH (ref 8–23)
CO2: 15 mmol/L — ABNORMAL LOW (ref 22–32)
Calcium: 8 mg/dL — ABNORMAL LOW (ref 8.9–10.3)
Chloride: 106 mmol/L (ref 98–111)
Creatinine, Ser: 2.62 mg/dL — ABNORMAL HIGH (ref 0.61–1.24)
GFR, Estimated: 26 mL/min — ABNORMAL LOW (ref >60)
Glucose, Bld: 230 mg/dL — ABNORMAL HIGH (ref 70–99)
Potassium: 4.1 mmol/L (ref 3.5–5.1)
Sodium: 131 mmol/L — ABNORMAL LOW (ref 135–145)
Total Bilirubin: 0.7 mg/dL (ref 0.3–1.2)
Total Protein: 6.9 g/dL (ref 6.5–8.1)

## 2021-12-07 LAB — CBC WITH DIFFERENTIAL/PLATELET
Abs Immature Granulocytes: 0 10*3/uL (ref 0.00–0.07)
Band Neutrophils: 6 %
Basophils Absolute: 0 10*3/uL (ref 0.0–0.1)
Basophils Relative: 0 %
Eosinophils Absolute: 0 10*3/uL (ref 0.0–0.5)
Eosinophils Relative: 0 %
HCT: 38.5 % — ABNORMAL LOW (ref 39.0–52.0)
Hemoglobin: 12.2 g/dL — ABNORMAL LOW (ref 13.0–17.0)
Lymphocytes Relative: 2 %
Lymphs Abs: 0.3 10*3/uL — ABNORMAL LOW (ref 0.7–4.0)
MCH: 30.5 pg (ref 26.0–34.0)
MCHC: 31.7 g/dL (ref 30.0–36.0)
MCV: 96.3 fL (ref 80.0–100.0)
Monocytes Absolute: 0.7 10*3/uL (ref 0.1–1.0)
Monocytes Relative: 4 %
Neutro Abs: 16.3 10*3/uL — ABNORMAL HIGH (ref 1.7–7.7)
Neutrophils Relative %: 88 %
Platelets: 152 10*3/uL (ref 150–400)
RBC: 4 MIL/uL — ABNORMAL LOW (ref 4.22–5.81)
RDW: 13.8 % (ref 11.5–15.5)
WBC: 17.3 10*3/uL — ABNORMAL HIGH (ref 4.0–10.5)
nRBC: 0 % (ref 0.0–0.2)

## 2021-12-07 LAB — LACTIC ACID, PLASMA
Lactic Acid, Venous: 1.8 mmol/L (ref 0.5–1.9)
Lactic Acid, Venous: 1.9 mmol/L (ref 0.5–1.9)
Lactic Acid, Venous: 2.4 mmol/L (ref 0.5–1.9)
Lactic Acid, Venous: 3 mmol/L (ref 0.5–1.9)

## 2021-12-07 LAB — CBG MONITORING, ED
Glucose-Capillary: 148 mg/dL — ABNORMAL HIGH (ref 70–99)
Glucose-Capillary: 160 mg/dL — ABNORMAL HIGH (ref 70–99)
Glucose-Capillary: 129 mg/dL — ABNORMAL HIGH (ref 70–99)

## 2021-12-07 LAB — PROCALCITONIN: Procalcitonin: 9.86 ng/mL

## 2021-12-07 LAB — RESP PANEL BY RT-PCR (FLU A&B, COVID) ARPGX2
Influenza A by PCR: NEGATIVE
Influenza B by PCR: NEGATIVE
SARS Coronavirus 2 by RT PCR: NEGATIVE

## 2021-12-07 LAB — APTT: aPTT: 35 seconds (ref 24–36)

## 2021-12-07 LAB — HIV ANTIBODY (ROUTINE TESTING W REFLEX): HIV Screen 4th Generation wRfx: NONREACTIVE

## 2021-12-07 LAB — HEMOGLOBIN A1C
Hgb A1c MFr Bld: 6.4 % — ABNORMAL HIGH (ref 4.8–5.6)
Mean Plasma Glucose: 136.98 mg/dL

## 2021-12-07 LAB — CORTISOL-AM, BLOOD: Cortisol - AM: 33.2 ug/dL — ABNORMAL HIGH (ref 6.7–22.6)

## 2021-12-07 LAB — GLUCOSE, CAPILLARY: Glucose-Capillary: 113 mg/dL — ABNORMAL HIGH (ref 70–99)

## 2021-12-07 LAB — MRSA NEXT GEN BY PCR, NASAL: MRSA by PCR Next Gen: NOT DETECTED

## 2021-12-07 MED ORDER — WARFARIN SODIUM 10 MG PO TABS
10.0000 mg | ORAL_TABLET | Freq: Once | ORAL | Status: AC
  Administered 2021-12-07: 10 mg via ORAL
  Filled 2021-12-07: qty 1

## 2021-12-07 MED ORDER — VANCOMYCIN HCL 1250 MG/250ML IV SOLN
1250.0000 mg | INTRAVENOUS | Status: DC
  Administered 2021-12-07: 1250 mg via INTRAVENOUS
  Filled 2021-12-07: qty 250

## 2021-12-07 MED ORDER — INSULIN ASPART 100 UNIT/ML IJ SOLN
4.0000 [IU] | Freq: Three times a day (TID) | INTRAMUSCULAR | Status: DC
  Administered 2021-12-07 – 2021-12-13 (×16): 4 [IU] via SUBCUTANEOUS
  Filled 2021-12-07: qty 0.04

## 2021-12-07 MED ORDER — ENOXAPARIN SODIUM 100 MG/ML IJ SOSY
100.0000 mg | PREFILLED_SYRINGE | Freq: Two times a day (BID) | INTRAMUSCULAR | Status: DC
  Administered 2021-12-07 – 2021-12-09 (×5): 100 mg via SUBCUTANEOUS
  Filled 2021-12-07 (×6): qty 1

## 2021-12-07 MED ORDER — WARFARIN - PHARMACIST DOSING INPATIENT: Freq: Every day | Status: DC

## 2021-12-07 MED ORDER — ACETAMINOPHEN 650 MG RE SUPP: 650.0000 mg | Freq: Four times a day (QID) | RECTAL | Status: DC | PRN

## 2021-12-07 MED ORDER — SODIUM CHLORIDE 0.9 % IV SOLN
2.0000 g | INTRAVENOUS | Status: DC
  Administered 2021-12-08: 2 g via INTRAVENOUS
  Filled 2021-12-07: qty 20

## 2021-12-07 MED ORDER — ONDANSETRON HCL 4 MG PO TABS: 4.0000 mg | ORAL_TABLET | Freq: Four times a day (QID) | ORAL | Status: DC | PRN

## 2021-12-07 MED ORDER — ONDANSETRON HCL 4 MG/2ML IJ SOLN: 4.0000 mg | Freq: Four times a day (QID) | INTRAMUSCULAR | Status: DC | PRN

## 2021-12-07 MED ORDER — INSULIN ASPART 100 UNIT/ML IJ SOLN
0.0000 [IU] | Freq: Three times a day (TID) | INTRAMUSCULAR | Status: DC
  Administered 2021-12-07: 3 [IU] via SUBCUTANEOUS
  Administered 2021-12-07 (×2): 2 [IU] via SUBCUTANEOUS
  Administered 2021-12-09 – 2021-12-10 (×2): 3 [IU] via SUBCUTANEOUS
  Administered 2021-12-11: 2 [IU] via SUBCUTANEOUS
  Administered 2021-12-11: 3 [IU] via SUBCUTANEOUS
  Administered 2021-12-12 (×2): 2 [IU] via SUBCUTANEOUS
  Administered 2021-12-13: 3 [IU] via SUBCUTANEOUS
  Administered 2021-12-13: 2 [IU] via SUBCUTANEOUS
  Filled 2021-12-07: qty 0.15

## 2021-12-07 MED ORDER — ACETAMINOPHEN 325 MG PO TABS
650.0000 mg | ORAL_TABLET | Freq: Four times a day (QID) | ORAL | Status: DC | PRN
  Administered 2021-12-07 – 2021-12-12 (×3): 650 mg via ORAL
  Filled 2021-12-07 (×4): qty 2

## 2021-12-07 MED ORDER — INSULIN NPH (HUMAN) (ISOPHANE) 100 UNIT/ML ~~LOC~~ SUSP
20.0000 [IU] | Freq: Two times a day (BID) | SUBCUTANEOUS | Status: DC
  Administered 2021-12-07 – 2021-12-13 (×12): 20 [IU] via SUBCUTANEOUS
  Filled 2021-12-07 (×2): qty 10

## 2021-12-07 NOTE — H&P (Signed)
History and Physical    Patient: [name] SMO:[id number] DOB: 09/09/1956 DOA: 12/06/2021 DOS: the patient was seen and examined on 12/07/2021 PCP: [name], MD  Patient coming from: Home  Chief Complaint:  Chief Complaint  Patient presents with   Sepsis    HPI: [name] is a 66 y.o. male with medical history significant of DM2, CKD3b, DVT/PE on coumadin, HTN.  Pt with onset of fever / tremor / illness last evening.  Then in AM when he woke up started noticing that his R leg painful to touch and turning red.  Didn't eat breakfast or take losartan today.  BGLs running higher than usual at home.  Went to PCPs office.  At PCPs office, they noted that BP running low despite missing losartan.  Concerned for sepsis, pt sent in to ED.  In ED: sepsis appears confirmed with initial hypotension that improved after 64ml/kg IVF bolus.  Lactates of 2.4 and 3.0 on repeat.  AKI.  WBC 17k.  Initial tachycardia.  Tm 99.2  Started on rocephin + Vanc.  Review of Systems: As mentioned in the history of present illness. All other systems reviewed and are negative. Past Medical History:  Diagnosis Date   Arthritis    Chronic kidney disease    reduced kidney function. Stage III   Diabetes mellitus without complication (HCC)    DVT (deep venous thrombosis) [facility])    age indeterminate LLE DVT 02/09/20   Dyspnea    On exertion   History of kidney stones    History of pulmonary embolus (PE)    Hypertension    Lipidemia    Prostate enlargement    Pulmonary embolism ([place]) 2009   Sleep apnea    Past Surgical History:  Procedure Laterality Date   CYSTOSCOPY W/ URETERAL STENT PLACEMENT Left 10/08/2020   Procedure: CYSTOSCOPY WITH RETROGRADE PYELOGRAM/URETERAL STENT PLACEMENT;  Surgeon: Irine Seal, MD;  Location: [facility];  Service: Urology;  Laterality: Left;   CYSTOSCOPY/URETEROSCOPY/HOLMIUM LASER/STENT PLACEMENT Right 06/07/2020   Procedure: CYSTOSCOPY/URETEROSCOPY/HOLMIUM LASER/STENT  PLACEMENT;  Surgeon: Lucas Mallow, MD;  Location: [facility];  Service: Urology;  Laterality: Right;   CYSTOSCOPY/URETEROSCOPY/HOLMIUM LASER/STENT PLACEMENT Left 10/19/2020   Procedure: CYSTOSCOPY LEFT URETEROSCOPY/HOLMIUM LASER/STENT EXCHANGE;  Surgeon: Irine Seal, MD;  Location: [facility];  Service: Urology;  Laterality: Left;   TOTAL KNEE ARTHROPLASTY Left 08/06/2021   Procedure: LEFT TOTAL KNEE ARTHROPLASTY;  Surgeon: Leandrew Koyanagi, MD;  Location: [facility];  Service: Orthopedics;  Laterality: Left;   Social History:  reports that he has never smoked. He has never used smokeless tobacco. He reports current alcohol use of about 1.0 standard drink per week. He reports that he does not use drugs.  No Known Allergies  Family History  Problem Relation Age of Onset   Cancer Mother    Heart disease Father     Prior to Admission medications   Medication Sig Start Date End Date Taking? Authorizing Provider  ACCU-CHEK GUIDE test strip USE TO CHECK BLOOD SUGAR 3 TIMES DAILY.E11.65 05/22/21   Shamleffer, Melanie Crazier, MD  Accu-Chek Softclix Lancets lancets Use as instructed to check blood sugar 3 times daily Dx is E11.65 09/18/20   Shamleffer, Melanie Crazier, MD  amLODipine (NORVASC) 10 MG tablet Take 1 tablet (10 mg total) by mouth daily. 02/18/20 07/26/22  Elouise Munroe, MD  aspirin EC 81 MG tablet Take 81 mg by mouth daily.    [provider]  BD INSULIN SYRINGE U/F 31G X 5/16"  1 ML MISC USE AS DIRECTED 03/29/21   Shamleffer, Melanie Crazier, MD  Blood Glucose Monitoring Suppl (ACCU-CHEK AVIVA) device by Other route. Use as instructed    [provider]  cephALEXin (KEFLEX) 500 MG capsule Take 1 capsule (500 mg total) by mouth 4 (four) times daily. 12/06/21   McElwee, Lauren A, NP  diclofenac Sodium (VOLTAREN) 1 % GEL Apply 1 application topically 4 (four) times daily as needed (pain).    [provider]  docusate sodium (COLACE) 100 MG capsule Take 1 capsule (100 mg  total) by mouth daily as needed. Patient not taking: No sig reported 07/31/21 07/31/22  Aundra Dubin, PA-C  Dulaglutide (TRULICITY) 1.5 VP/7.1GG SOPN Inject 1.5 mg into the skin once a week. 08/30/21   Shamleffer, Melanie Crazier, MD  gabapentin (NEURONTIN) 300 MG capsule TAKE 1 CAPSULE BY MOUTH FOUR TIMES A DAY 10/02/21   [name], MD  insulin NPH-regular Human (HUMULIN 70/30) (70-30) 100 UNIT/ML injection Max daily up to 90 mL 10/17/21   Shamleffer, Melanie Crazier, MD  Insulin Pen Needle 32G X 4 MM MISC 1 Device by Does not apply route in the morning and at bedtime. 08/30/21   Shamleffer, Melanie Crazier, MD  Insulin Syringes, Disposable, U-100 1 ML MISC 1 Device by Does not apply route as directed. 03/28/21   [name], MD  losartan (COZAAR) 50 MG tablet TAKE 1 TABLET BY MOUTH EVERY DAY 08/27/21   [name], MD  lovastatin (MEVACOR) 40 MG tablet TAKE 1 TABLET (40 MG TOTAL) BY MOUTH AT BEDTIME. **NEEDS APPT BEFORE NEXT REFILL** 10/08/21   [name], MD  methocarbamol (ROBAXIN) 500 MG tablet Take 1 tablet (500 mg total) by mouth 2 (two) times daily as needed. To be taken after surgery Patient not taking: No sig reported 07/31/21   Aundra Dubin, PA-C  montelukast (SINGULAIR) 10 MG tablet Take 1 tablet (10 mg total) by mouth at bedtime. 11/20/20   Kennyth Arnold, FNP  ondansetron (ZOFRAN) 4 MG tablet Take 1 tablet (4 mg total) by mouth every 8 (eight) hours as needed for nausea or vomiting. 07/31/21   Aundra Dubin, PA-C  oxyCODONE-acetaminophen (PERCOCET) 5-325 MG tablet Take 1-2 tablets by mouth every 6 (six) hours as needed. To be taken after surgery 07/31/21   Aundra Dubin, PA-C  warfarin (COUMADIN) 5 MG tablet TAKE 1 TABLET (5 MG TOTAL) BY MOUTH DAILY. TAKE AS DIRECTED. 09/24/21   [name], MD    Physical Exam: Vitals:   12/07/21 0015 12/07/21 0030 12/07/21 0115 12/07/21 0145  BP: 100/60 (!) 97/55 (!) 113/59 115/60   Pulse: 99 97 97 (!) 101  Resp: (!) 23 17 18 19   Temp:      TempSrc:      SpO2: 99% 98% 98% 100%   Constitutional: NAD, mildly ill appearing Eyes: PERRL, lids and conjunctivae normal ENMT: Mucous membranes are moist. Posterior pharynx clear of any exudate or lesions.Normal dentition.  Neck: normal, supple, no masses, no thyromegaly Respiratory: clear to auscultation bilaterally, no wheezing, no crackles. Normal respiratory effort. No accessory muscle use.  Cardiovascular: Regular rate and rhythm, no murmurs / rubs / gallops. No extremity edema. 2+ pedal pulses. No carotid bruits.  Abdomen: no tenderness, no masses palpated. No hepatosplenomegaly. Bowel sounds positive.  Musculoskeletal: no clubbing / cyanosis. No joint deformity upper and lower extremities. Good ROM, no contractures. Normal muscle tone.  Skin: Severe erythema, edema, of R lower extremity below knee.  Mild TTP not out of proportion to appearance, no crepitus. Neurologic: CN 2-12 grossly intact. Sensation intact, DTR normal. Strength 5/5 in all 4.  Psychiatric: Normal judgment and insight. Alert and oriented x 3. Normal mood.    Data Reviewed:  CT of leg = cellulitis with no abscess WBC 17k, lactates 2.4 and 3.0 on repeat.  AKI with creat 2.6 up from 1.7 baseline.  COVID and flu neg.  CXR neg.  UA still pending.   Assessment and Plan: * Sepsis due to cellulitis [facility])- (present on admission) Patient meets criteria for sepsis at time of admission. Specifically the patient has at least 2 out of 4 SIRS criteria, namely: WBC, tachycardia The currently suspected source of infection is cellulitis of RLE Lactic acid: 2.4 initially, 3.0 on repeat Blood pressure: Initially hypotensive with SBPs in the low 90s, now improved to low 100s after 30cc/kg IVF bolus IVF: 30cc/kg bolus Antibiotics: Continue rocephin and vanc started in ED for the moment. BCx pending MRSA PCR nares pending (DC vanc if neg). No evidence of  nec-fash, drain able abscess, or surgical indication on CT at this time. UA also still pending to make sure we dont have a second source.  Cellulitis of right lower extremity- (present on admission) DDx includes DVT, though phlegmasia cerulea dolens seems less likely. Also he is on chronic AC, and his prior DVT was in the other leg However, patient definitely acting clinically more like severe cellulitis / infectious process going on. CT also suggestive of cellulitis. Given how impressive his leg looks and h/o DVT in past, will get [place] as well. And continue AC.  Type 2 diabetes mellitus with diabetic polyneuropathy, with long-term current use of insulin ([place]) Hold trulicity.  Looks like he takes 30-40u of 70/30 with breakfast and 40-50u of 70/30 with dinner at home at baseline. Will put him on 20u NPH BID here. 4u novolog mealtimes. And mod scale SSI AC  AKI (acute kidney injury) ([place])- (present on admission) AKI on CKD 3b. Likely pre-renal from sepsis. IVF sepsis fluid bolus. Strict intake and output Monitor daily BMP.  Essential hypertension- (present on admission) Holding all home BP meds in setting of sepsis with initial hypotension and AKI.  History of pulmonary embolism- (present on admission) Continue home coumadin. Lovenox [place] per pharm to bridge.       Advance Care Planning:   Code Status: Full Code   Consults: None  Family Communication: No family in room  Severity of Illness: The appropriate patient status for this patient is INPATIENT. Inpatient status is judged to be reasonable and necessary in order to provide the required intensity of service to ensure the patient's safety. The patient's presenting symptoms, physical exam findings, and initial radiographic and laboratory data in the context of their chronic comorbidities is felt to place them at high risk for further clinical deterioration. Furthermore, it is not anticipated that the patient will be medically  stable for discharge from the hospital within 2 midnights of admission.   * I certify that at the point of admission it is my clinical judgment that the patient will require inpatient hospital care spanning beyond 2 midnights from the point of admission due to high intensity of service, high risk for further deterioration and high frequency of surveillance required.*  Author: Etta Quill., DO 12/07/2021 2:30 AM  For on call review [email or web address].

## 2021-12-07 NOTE — ED Notes (Signed)
Asked patient to provide urine sample. Patient said he still cannot at this time.

## 2021-12-07 NOTE — Assessment & Plan Note (Addendum)
Patient meets criteria for sepsis at time of admission. Specifically the patient has at least 2 out of 4 SIRS criteria, namely: WBC, tachycardia The currently suspected source of infection is cellulitis of RLE Lactic acid: 2.4 initially, 3.0 on repeat Blood pressure: Initially hypotensive with SBPs in the low 90s, now improved to low 100s after 30cc/kg IVF bolus IVF: 30cc/kg bolus Antibiotics: Continue rocephin and vanc started in ED for the moment. BCx pending MRSA PCR nares pending (DC vanc if neg). No evidence of nec-fash, drain able abscess, or surgical indication on CT at this time. UA also still pending to make sure we dont have a second source.

## 2021-12-07 NOTE — ED Notes (Signed)
Pt tolerated well ambulating to and from bathroom. Breakfast tray provided.

## 2021-12-07 NOTE — Progress Notes (Signed)
Pharmacy Antibiotic Note  [name] is a 66 y.o. male admitted on 12/06/2021 with sepsis due to cellulitis.  Pharmacy has been consulted for Vancomycin dosing. Acute kidney injury noted on admission, Scr 1.77 on 03/30/21 WBC elevated but currently afebrile.  Plan: Vancomycin 1250 mg IV q24 Calculated AUC = 483.4   Monitor renal function and CX data    Temp (24hrs), Avg:97.7 F (36.5 C), Min:96.1 F (35.6 C), Max:99.2 F (37.3 C)  Recent Labs  Lab 12/06/21 1216 12/06/21 2337 12/07/21 0142 12/07/21 0235  WBC 17.7* 17.3*  --  17.4*  CREATININE 2.10* 2.62*  --  2.34*  LATICACIDVEN  --  2.4* 3.0*  --     Estimated Creatinine Clearance: 39.6 mL/min (A) (by C-G formula based on SCr of 2.34 mg/dL (H)).    No Known Allergies  Antimicrobials this admission: 2/2 Ceftriaxone >>  2/2 Vancomycin >>   Dose adjustments this admission:   Microbiology results: 2/2 BCx:  2/3 UCx:   2/3 MRSA PCR:   Thank you for allowing pharmacy to be a part of this patients care.  Deirdre Evener, PharmD student 12/07/2021 3:36 AM

## 2021-12-07 NOTE — Sepsis Progress Note (Signed)
Notified provider of need to order repeat lactic acid. ° °

## 2021-12-07 NOTE — Progress Notes (Signed)
ANTICOAGULATION CONSULT NOTE - Initial Consult  Pharmacy Consult for Lovenox/Coumadin  Indication: pulmonary embolus  No Known Allergies  Patient Measurements:   Vital Signs: Temp: 99.2 F (37.3 C) (02/02 2311) Temp Source: Oral (02/02 2311) BP: 101/60 (02/03 0300) Pulse Rate: 97 (02/03 0300)  Labs: Recent Labs    12/06/21 1216 12/06/21 2337 12/06/21 2346 12/07/21 0235  HGB 13.6 12.2*  --  11.4*  HCT 41.8 38.5*  --  35.5*  PLT 199.0 152  --  127*  APTT  --   --  35  --   LABPROT  --   --  19.5* 20.1*  INR  --   --  1.6* 1.7*  CREATININE 2.10* 2.62*  --  2.34*    Estimated Creatinine Clearance: 39.6 mL/min (A) (by C-G formula based on SCr of 2.34 mg/dL (H)).   Medical History: Past Medical History:  Diagnosis Date   Arthritis    Chronic kidney disease    reduced kidney function. Stage III   Diabetes mellitus without complication (HCC)    DVT (deep venous thrombosis) [facility])    age indeterminate LLE DVT 02/09/20   Dyspnea    On exertion   History of kidney stones    History of pulmonary embolus (PE)    Hypertension    Lipidemia    Prostate enlargement    Pulmonary embolism ([place]) 2009   Sleep apnea     Medications:  Coumadin 5mg  tab daily except 7.5mg  tab on Thursdays and Saturdays per coumadin clinic note  Assessment: Patient on Coumadin for PE. INR subtherapeutic on admission. Admitted for sepsis due to cellulitis. Started on broad spectrum antibiotics which can increase INR. Acute kidney injury noted, CrCl > 30 mL/min. Hb and Plt WNL.   Goal of Therapy:  INR 2-3  Plan:  Coumadin 10mg  PO x1 today Lovenox 100mg  SubQ q12 until INR >2 Daily INR Monitor CBC & renal function F/U with patient to verify home regimen.  Deirdre Evener, PharmD student  12/07/2021,3:59 AM

## 2021-12-07 NOTE — Assessment & Plan Note (Addendum)
DDx includes DVT, though phlegmasia cerulea dolens seems less likely. Also he is on chronic AC, and his prior DVT was in the other leg However, patient definitely acting clinically more like severe cellulitis / infectious process going on. CT also suggestive of cellulitis. Given how impressive his leg looks and h/o DVT in past, will get [place] as well. And continue AC.

## 2021-12-07 NOTE — Progress Notes (Signed)
Called and informed patient of results, patient is currently admitted to the hospital. Told patient I will inform you of the recent change in care.

## 2021-12-07 NOTE — Sepsis Progress Note (Signed)
Notified bedside nurse of need to draw repeat lactic acid. 

## 2021-12-07 NOTE — Progress Notes (Signed)
BLE venous duplex has been completed.  Preliminary findings messaged to Dr. Dwyane Dee via secure messenger.    Results can be found under chart review under CV PROC. 12/07/2021 11:19 AM Tehani Mersman RVT, RDMS

## 2021-12-07 NOTE — Assessment & Plan Note (Signed)
Hold trulicity.  Looks like he takes 30-40u of 70/30 with breakfast and 40-50u of 70/30 with dinner at home at baseline. Will put him on 20u NPH BID here. 4u novolog mealtimes. And mod scale SSI AC

## 2021-12-07 NOTE — Sepsis Progress Note (Signed)
Monitoring for the code sepsis protocol. °

## 2021-12-07 NOTE — Care Plan (Signed)
This 65 years old male with PMH significant for type 2 diabetes, CKD stage IIIb, DVT/ PE on Coumadin, hypertension presented in the ED with acute onset of fever, tremors, started yesterday.  Patient reports he woke up started noticing that his right leg is painful to touch and turning red.  He denies any trauma or injury to the leg. He went to his PCPs office, his blood pressure was found to be low despite not taking her blood pressure medications.  His PCP was concerned about sepsis  and was sent in the ED.  Patient is being admitted for sepsis sec. to RLE cellulitis and started on IV antibiotics.  Patient is on Coumadin for DVT.  Bilateral venous duplex completed, shows age-indeterminate DVT in the left leg. Patient was seen and examined at bedside reports having significant pain, right leg is erythematous swollen and tender.  Continue adequate pain control.  IV antibiotics.  IV hydration.

## 2021-12-07 NOTE — ED Notes (Signed)
[place], partner.

## 2021-12-07 NOTE — ED Provider Notes (Signed)
[facility] Emergency Department Provider Note MRN:  [id number]  Arrival date & time: 12/07/21     Chief Complaint   Sepsis   History of Present Illness   [name] is a 66 y.o. year-old male with a history of diabetes, presenting to the ED with chief complaint of sepsis.  Right leg pain, redness, swelling for the past few days, low blood pressure at the PCP office, started on antibiotics.  Worsening pain, feeling more dizzy, here for evaluation.  Review of Systems  A thorough review of systems was obtained and all systems are negative except as noted in the HPI and PMH.   Patient's Health History    Past Medical History:  Diagnosis Date   Arthritis    Chronic kidney disease    reduced kidney function. Stage III   Diabetes mellitus without complication (HCC)    DVT (deep venous thrombosis) [facility])    age indeterminate LLE DVT 02/09/20   Dyspnea    On exertion   History of kidney stones    History of pulmonary embolus (PE)    Hypertension    Lipidemia    Prostate enlargement    Pulmonary embolism ([place]) 2009   Sleep apnea     Past Surgical History:  Procedure Laterality Date   CYSTOSCOPY W/ URETERAL STENT PLACEMENT Left 10/08/2020   Procedure: CYSTOSCOPY WITH RETROGRADE PYELOGRAM/URETERAL STENT PLACEMENT;  Surgeon: Irine Seal, MD;  Location: [place];  Service: Urology;  Laterality: Left;   CYSTOSCOPY/URETEROSCOPY/HOLMIUM LASER/STENT PLACEMENT Right 06/07/2020   Procedure: CYSTOSCOPY/URETEROSCOPY/HOLMIUM LASER/STENT PLACEMENT;  Surgeon: Lucas Mallow, MD;  Location: [facility];  Service: Urology;  Laterality: Right;   CYSTOSCOPY/URETEROSCOPY/HOLMIUM LASER/STENT PLACEMENT Left 10/19/2020   Procedure: CYSTOSCOPY LEFT URETEROSCOPY/HOLMIUM LASER/STENT EXCHANGE;  Surgeon: Irine Seal, MD;  Location: [facility];  Service: Urology;  Laterality: Left;   TOTAL KNEE ARTHROPLASTY Left 08/06/2021   Procedure: LEFT TOTAL KNEE ARTHROPLASTY;  Surgeon: Leandrew Koyanagi, MD;   Location: [place];  Service: Orthopedics;  Laterality: Left;    Family History  Problem Relation Age of Onset   Cancer Mother    Heart disease Father     Social History   Socioeconomic History   Marital status: Significant Other    Spouse name: Not on file   Number of children: Not on file   Years of education: Not on file   Highest education level: Not on file  Occupational History   Occupation: retired  Tobacco Use   Smoking status: Never   Smokeless tobacco: Never  Vaping Use   Vaping Use: Never used  Substance and Sexual Activity   Alcohol use: Yes    Alcohol/week: 1.0 standard drink    Types: 1 Cans of beer per week    Comment: social   Drug use: Never   Sexual activity: Yes  Other Topics Concern   Not on file  Social History Narrative   Lives with Friend   Right handed   Drinks 1-2 cups caffeine daily   Social Determinants of Health   Financial Resource Strain: Not on file  Food Insecurity: Not on file  Transportation Needs: Not on file  Physical Activity: Not on file  Stress: Not on file  Social Connections: Not on file  Intimate Partner Violence: Not on file     Physical Exam   Vitals:   12/07/21 0545 12/07/21 0630  BP: 112/68 (!) 105/58  Pulse: (!) 112 (!) 103  Resp: 16 16  Temp:  SpO2: 99% 97%    CONSTITUTIONAL: Well-appearing, NAD NEURO/PSYCH:  Alert and oriented x 3, no focal deficits EYES:  eyes equal and reactive ENT/NECK:  no LAD, no JVD CARDIO: Regular rate, well-perfused, normal S1 and S2 PULM:  CTAB no wheezing or rhonchi GI/GU:  non-distended, non-tender MSK/SPINE:  No gross deformities SKIN: Erythema and edema to the right lower extremity   *Additional and/or pertinent findings included in MDM below  Diagnostic and Interventional Summary    EKG Interpretation  Date/Time:  Thursday December 06 2021 23:32:24 EST Ventricular Rate:  102 PR Interval:  178 QRS Duration: 95 QT Interval:  337 QTC Calculation: 439 R  Axis:   -10 Text Interpretation: Sinus tachycardia Confirmed by Gerlene Fee [phone]) on 12/07/2021 12:35:03 AM       Labs Reviewed  LACTIC ACID, PLASMA - Abnormal; Notable for the following components:      Result Value   Lactic Acid, Venous 2.4 (*)    All other components within normal limits  LACTIC ACID, PLASMA - Abnormal; Notable for the following components:   Lactic Acid, Venous 3.0 (*)    All other components within normal limits  COMPREHENSIVE METABOLIC PANEL - Abnormal; Notable for the following components:   Sodium 131 (*)    CO2 15 (*)    Glucose, Bld 230 (*)    BUN 43 (*)    Creatinine, Ser 2.62 (*)    Calcium 8.0 (*)    Albumin 3.3 (*)    GFR, Estimated 26 (*)    All other components within normal limits  CBC WITH DIFFERENTIAL/PLATELET - Abnormal; Notable for the following components:   WBC 17.3 (*)    RBC 4.00 (*)    Hemoglobin 12.2 (*)    HCT 38.5 (*)    Neutro Abs 16.3 (*)    Lymphs Abs 0.3 (*)    All other components within normal limits  URINALYSIS, ROUTINE W REFLEX MICROSCOPIC - Abnormal; Notable for the following components:   Specific Gravity, Urine <1.005 (*)    Hgb urine dipstick MODERATE (*)    Bacteria, UA RARE (*)    All other components within normal limits  PROTIME-INR - Abnormal; Notable for the following components:   Prothrombin Time 19.5 (*)    INR 1.6 (*)    All other components within normal limits  BASIC METABOLIC PANEL - Abnormal; Notable for the following components:   Sodium 132 (*)    CO2 17 (*)    Glucose, Bld 195 (*)    BUN 43 (*)    Creatinine, Ser 2.34 (*)    Calcium 7.6 (*)    GFR, Estimated 30 (*)    All other components within normal limits  PROTIME-INR - Abnormal; Notable for the following components:   Prothrombin Time 20.1 (*)    INR 1.7 (*)    All other components within normal limits  CORTISOL-AM, BLOOD - Abnormal; Notable for the following components:   Cortisol - AM 33.2 (*)    All other components within  normal limits  CBC - Abnormal; Notable for the following components:   WBC 17.4 (*)    RBC 3.74 (*)    Hemoglobin 11.4 (*)    HCT 35.5 (*)    Platelets 127 (*)    All other components within normal limits  HEMOGLOBIN A1C - Abnormal; Notable for the following components:   Hgb A1c MFr Bld 6.4 (*)    All other components within normal limits  PROTIME-INR - Abnormal; Notable  for the following components:   Prothrombin Time 19.4 (*)    INR 1.6 (*)    All other components within normal limits  RESP PANEL BY RT-PCR (FLU A&B, COVID) ARPGX2  MRSA NEXT GEN BY PCR, NASAL  CULTURE, BLOOD (ROUTINE X 2)  CULTURE, BLOOD (ROUTINE X 2)  URINE CULTURE  APTT  PROCALCITONIN  LACTIC ACID, PLASMA  HIV ANTIBODY (ROUTINE TESTING W REFLEX)  LACTIC ACID, PLASMA    CT EXTREMITY LOWER RIGHT WO CONTRAST  Final Result    DG Chest Port 1 View  Final Result    VAS [place] LOWER EXTREMITY VENOUS (DVT)    (Results Pending)    Medications  cefTRIAXone (ROCEPHIN) 2 g in sodium chloride 0.9 % 100 mL IVPB (has no administration in time range)  acetaminophen (TYLENOL) tablet 650 mg (has no administration in time range)    Or  acetaminophen (TYLENOL) suppository 650 mg (has no administration in time range)  ondansetron (ZOFRAN) tablet 4 mg (has no administration in time range)    Or  ondansetron (ZOFRAN) injection 4 mg (has no administration in time range)  insulin NPH Human (NOVOLIN N) injection 20 Units (has no administration in time range)  insulin aspart (novoLOG) injection 4 Units (has no administration in time range)  insulin aspart (novoLOG) injection 0-15 Units (has no administration in time range)  enoxaparin (LOVENOX) injection 100 mg (100 mg Subcutaneous Given 12/07/21 0505)  vancomycin (VANCOREADY) IVPB 1250 mg/250 mL (has no administration in time range)  warfarin (COUMADIN) tablet 10 mg (has no administration in time range)  Warfarin - Pharmacist Dosing Inpatient (has no administration in time range)   lactated ringers bolus 1,000 mL (0 mLs Intravenous Stopped 12/07/21 0150)    And  lactated ringers bolus 1,000 mL (0 mLs Intravenous Stopped 12/07/21 0150)    And  lactated ringers bolus 1,000 mL (0 mLs Intravenous Stopped 12/07/21 0150)    And  lactated ringers bolus 500 mL (0 mLs Intravenous Stopped 12/07/21 0150)  cefTRIAXone (ROCEPHIN) 2 g in sodium chloride 0.9 % 100 mL IVPB (0 g Intravenous Stopped 12/07/21 0150)  vancomycin (VANCOREADY) IVPB 2000 mg/400 mL (0 mg Intravenous Stopped 12/07/21 0229)     Procedures  /  Critical Care .Critical Care Performed by: Maudie Flakes, MD Authorized by: Maudie Flakes, MD   Critical care provider statement:    Critical care time (minutes):  35   Critical care was necessary to treat or prevent imminent or life-threatening deterioration of the following conditions:  Sepsis   Critical care was time spent personally by me on the following activities:  Development of treatment plan with patient or surrogate, discussions with consultants, evaluation of patient's response to treatment, examination of patient, ordering and review of laboratory studies, ordering and review of radiographic studies, ordering and performing treatments and interventions, pulse oximetry, re-evaluation of patient's condition and review of old charts  ED Course and Medical Decision Making  Initial Impression and Ddx Concern for sepsis, possibly septic shock given the low blood pressure.  Providing 30 cc/kg bolus, empiric antibiotics.  Presume soft tissue origin.  Will obtain CT of the leg to further evaluate for deeper space infection.  Past medical/surgical history that increases complexity of ED encounter: Diabetes, CKD  Interpretation of Diagnostics I personally reviewed the EKG and my interpretation is as follows: Sinus tachycardia    Labs reveal elevated lactate, hyponatremia  Patient Reassessment and Ultimate Disposition/Management Admitted to medicine for further care of  likely soft tissue  sepsis.  CT scan without obvious signs of necrotizing infection or abscess.  Patient management required discussion with the following services or consulting groups:  Hospitalist Service  Complexity of Problems Addressed Acute illness or injury that poses threat of life of bodily function  Additional Data Reviewed and Analyzed Further history obtained from: Recent PCP notes  Factors Impacting ED Encounter Risk Consideration of hospitalization  Barth Kirks. [place]onia Small, [place] [email or web address]  Final Clinical Impressions(s) / ED Diagnoses     ICD-10-CM   1. Sepsis, due to unspecified organism, unspecified whether acute organ dysfunction present [facility])  A41.9       ED Discharge Orders     None        Discharge Instructions Discussed with and Provided to Patient:   Discharge Instructions   None      Maudie Flakes, MD 12/07/21 [phone]

## 2021-12-07 NOTE — Assessment & Plan Note (Signed)
AKI on CKD 3b. Likely pre-renal from sepsis. IVF sepsis fluid bolus. Strict intake and output Monitor daily BMP.

## 2021-12-07 NOTE — ED Notes (Signed)
Patient ambulated to the restroom without assistance 

## 2021-12-07 NOTE — Assessment & Plan Note (Signed)
Continue home coumadin. Lovenox [place] per pharm to bridge.

## 2021-12-07 NOTE — Assessment & Plan Note (Signed)
Holding all home BP meds in setting of sepsis with initial hypotension and AKI.

## 2021-12-07 NOTE — ED Notes (Signed)
Dinner provided.

## 2021-12-08 DIAGNOSIS — A419 Sepsis, unspecified organism: Secondary | ICD-10-CM | POA: Diagnosis not present

## 2021-12-08 DIAGNOSIS — L0390 Cellulitis, unspecified: Secondary | ICD-10-CM | POA: Diagnosis not present

## 2021-12-08 LAB — GLUCOSE, CAPILLARY
Glucose-Capillary: 109 mg/dL — ABNORMAL HIGH (ref 70–99)
Glucose-Capillary: 115 mg/dL — ABNORMAL HIGH (ref 70–99)
Glucose-Capillary: 95 mg/dL (ref 70–99)
Glucose-Capillary: 125 mg/dL — ABNORMAL HIGH (ref 70–99)

## 2021-12-08 LAB — RENAL FUNCTION PANEL
Albumin: 2.9 g/dL — ABNORMAL LOW (ref 3.5–5.0)
Anion gap: 10 (ref 5–15)
BUN: 59 mg/dL — ABNORMAL HIGH (ref 8–23)
CO2: 19 mmol/L — ABNORMAL LOW (ref 22–32)
Calcium: 7.9 mg/dL — ABNORMAL LOW (ref 8.9–10.3)
Chloride: 105 mmol/L (ref 98–111)
Creatinine, Ser: 2.99 mg/dL — ABNORMAL HIGH (ref 0.61–1.24)
GFR, Estimated: 22 mL/min — ABNORMAL LOW (ref >60)
Glucose, Bld: 112 mg/dL — ABNORMAL HIGH (ref 70–99)
Phosphorus: 2.6 mg/dL (ref 2.5–4.6)
Potassium: 3.4 mmol/L — ABNORMAL LOW (ref 3.5–5.1)
Sodium: 134 mmol/L — ABNORMAL LOW (ref 135–145)

## 2021-12-08 LAB — CBC
HCT: 33.3 % — ABNORMAL LOW (ref 39.0–52.0)
Hemoglobin: 11 g/dL — ABNORMAL LOW (ref 13.0–17.0)
MCH: 30.6 pg (ref 26.0–34.0)
MCHC: 33 g/dL (ref 30.0–36.0)
MCV: 92.5 fL (ref 80.0–100.0)
Platelets: 124 10*3/uL — ABNORMAL LOW (ref 150–400)
RBC: 3.6 MIL/uL — ABNORMAL LOW (ref 4.22–5.81)
RDW: 13.9 % (ref 11.5–15.5)
WBC: 12.9 10*3/uL — ABNORMAL HIGH (ref 4.0–10.5)
nRBC: 0 % (ref 0.0–0.2)

## 2021-12-08 LAB — URINE CULTURE: Culture: NO GROWTH

## 2021-12-08 LAB — CREATININE, SERUM
Creatinine, Ser: 3.15 mg/dL — ABNORMAL HIGH (ref 0.61–1.24)
GFR, Estimated: 21 mL/min — ABNORMAL LOW (ref >60)

## 2021-12-08 LAB — PROTIME-INR
INR: 1.9 — ABNORMAL HIGH (ref 0.8–1.2)
Prothrombin Time: 22.1 seconds — ABNORMAL HIGH (ref 11.4–15.2)

## 2021-12-08 MED ORDER — CEFAZOLIN SODIUM-DEXTROSE 2-4 GM/100ML-% IV SOLN
2.0000 g | Freq: Two times a day (BID) | INTRAVENOUS | Status: DC
  Administered 2021-12-08 – 2021-12-09 (×3): 2 g via INTRAVENOUS
  Filled 2021-12-08 (×4): qty 100

## 2021-12-08 MED ORDER — WARFARIN SODIUM 5 MG PO TABS
5.0000 mg | ORAL_TABLET | Freq: Once | ORAL | Status: AC
Start: 2021-12-08 — End: 2021-12-08
  Administered 2021-12-08: 5 mg via ORAL
  Filled 2021-12-08: qty 1

## 2021-12-08 MED ORDER — LACTATED RINGERS IV SOLN: INTRAVENOUS | Status: AC

## 2021-12-08 NOTE — Consult Note (Signed)
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  Admitted through the emergency room yesterday with complaints of feeling generally poor with associated fever and rigors as well as a painful red right leg.  When seen at his primary care provider's office, he was found to have hyperglycemia and low blood pressures in spite of having missed his losartan with concerns raised for sepsis.  Labs indicate an elevated creatinine of 2.1 on 2/2 that rose to 2.6 later that day and had improved partially to 2.3 on 2/3 with IV fluids however today is up to 3.1.  Urine output charted as 1 L from overnight.  Prior to admission, he was on losartan 50 mg daily but did not take any yesterday.  He reports no NSAID use prior to admission (stopped these 2 years ago when diagnosed with chronic kidney disease) and reports a reduction of appetite/fluid intake prior to admission.  Past Medical History:  Diagnosis Date   Arthritis    Chronic kidney disease    reduced kidney function. Stage III   Diabetes mellitus without complication (HCC)    DVT (deep venous thrombosis) [facility])    age indeterminate LLE DVT 02/09/20   Dyspnea    On exertion   History of kidney stones    History of pulmonary embolus (PE)    Hypertension    Lipidemia    Prostate enlargement    Pulmonary embolism ([place]) 2009   Sleep apnea     Past Surgical History:  Procedure Laterality Date   CYSTOSCOPY W/ URETERAL STENT PLACEMENT Left 10/08/2020   Procedure: CYSTOSCOPY WITH RETROGRADE PYELOGRAM/URETERAL STENT PLACEMENT;  Surgeon: Irine Seal, MD;  Location: [facility];  Service: Urology;  Laterality: Left;    CYSTOSCOPY/URETEROSCOPY/HOLMIUM LASER/STENT PLACEMENT Right 06/07/2020   Procedure: CYSTOSCOPY/URETEROSCOPY/HOLMIUM LASER/STENT PLACEMENT;  Surgeon: Lucas Mallow, MD;  Location: [facility];  Service: Urology;  Laterality: Right;   CYSTOSCOPY/URETEROSCOPY/HOLMIUM LASER/STENT PLACEMENT Left 10/19/2020   Procedure: CYSTOSCOPY LEFT URETEROSCOPY/HOLMIUM LASER/STENT EXCHANGE;  Surgeon: Irine Seal, MD;  Location: [facility];  Service: Urology;  Laterality: Left;   TOTAL KNEE ARTHROPLASTY Left 08/06/2021   Procedure: LEFT TOTAL KNEE ARTHROPLASTY;  Surgeon: Leandrew Koyanagi, MD;  Location: [place];  Service: Orthopedics;  Laterality: Left;    Family History  Problem Relation Age of Onset   Cancer Mother    Heart disease Father     Social History:  reports that he has never smoked. He has never used smokeless tobacco. He reports current alcohol use of about 1.0 standard drink per week. He reports that he does not use drugs.  Allergies: No Known Allergies  Medications: I have reviewed the patient's current medications. Scheduled:  enoxaparin (LOVENOX) injection  100 mg Subcutaneous Q12H   insulin aspart  0-15 Units Subcutaneous TID WC   insulin aspart  4 Units Subcutaneous TID WC   insulin NPH Human  20 Units Subcutaneous BID AC & HS   Warfarin - Pharmacist Dosing Inpatient   Does not apply q1600    BMP Latest Ref Rng & Units 12/08/2021 12/07/2021 12/06/2021  Glucose 70 - 99 mg/dL - 195(H) 230(H)  BUN 8 - 23 mg/dL - 43(H) 43(H)  Creatinine 0.61 - 1.24 mg/dL  3.15(H) 2.34(H) 2.62(H)  Sodium 135 - 145 mmol/L - 132(L) 131(L)  Potassium 3.5 - 5.1 mmol/L - 3.9 4.1  Chloride 98 - 111 mmol/L - 105 106  CO2 22 - 32 mmol/L - 17(L) 15(L)  Calcium 8.9 - 10.3 mg/dL - 7.6(L) 8.0(L)   CBC Latest Ref Rng & Units 12/08/2021 12/07/2021 12/06/2021  WBC 4.0 - 10.5 K/uL 12.9(H) 17.4(H) 17.3(H)  Hemoglobin 13.0 - 17.0 g/dL 11.0(L) 11.4(L) 12.2(L)  Hematocrit 39.0 - 52.0 % 33.3(L) 35.5(L) 38.5(L)  Platelets 150 - 400 K/uL 124(L)  127(L) 152   Urinalysis    Component Value Date/Time   COLORURINE YELLOW 12/07/2021 [place] 12/07/2021 0237   LABSPEC <1.005 (L) 12/07/2021 0237   PHURINE 5.5 12/07/2021 0237   GLUCOSEU NEGATIVE 12/07/2021 0237   GLUCOSEU NEGATIVE 07/10/2021 1129   HGBUR MODERATE (A) 12/07/2021 0237   BILIRUBINUR NEGATIVE 12/07/2021 0237   KETONESUR NEGATIVE 12/07/2021 0237   PROTEINUR NEGATIVE 12/07/2021 0237   UROBILINOGEN 0.2 07/10/2021 1129   NITRITE NEGATIVE 12/07/2021 0237   LEUKOCYTESUR NEGATIVE 12/07/2021 0237      DG Chest Port 1 View  Result Date: 12/07/2021 CLINICAL DATA:  Possible sepsis EXAM: PORTABLE CHEST 1 VIEW COMPARISON:  03/30/2021 FINDINGS: The heart size and mediastinal contours are within normal limits. Both lungs are clear. The visualized skeletal structures are unremarkable. IMPRESSION: No active disease. Electronically Signed   By: Inez Catalina M.D.   On: 12/07/2021 00:02   CT EXTREMITY LOWER RIGHT WO CONTRAST  Result Date: 12/07/2021 CLINICAL DATA:  Right lower extremity cellulitis, leukocytosis, on antibiotics EXAM: CT OF THE LOWER RIGHT EXTREMITY WITHOUT CONTRAST TECHNIQUE: Multidetector CT imaging of the right lower extremity was performed according to the standard protocol. RADIATION DOSE REDUCTION: This exam was performed according to the departmental dose-optimization program which includes automated exposure control, adjustment of the mA and/or kV according to patient size and/or use of iterative reconstruction technique. COMPARISON:  None. FINDINGS: No fracture is seen. No cortical destruction to suggest acute osteomyelitis. No drainable fluid collection/abscess. Subcutaneous edema with trace fluid along the medial aspect of the calf (series 4/image 414), with overlying mild skin thickening (series 4/image 412), corresponding to the clinical history of cellulitis. Additional subcutaneous edema/cutaneous thickening along the medial and lateral ankle (series  4/image 493). IMPRESSION: Suspected cellulitis along the medial calf. No drainable fluid collection/abscess. No acute osseous abnormality Electronically Signed   By: Julian Hy M.D.   On: 12/07/2021 02:02   VAS [place] LOWER EXTREMITY VENOUS (DVT)  Result Date: 12/07/2021  Lower Venous DVT Study Patient Name:  [name]  Date of Exam:   12/07/2021 Medical Rec #: [id number]    Accession #:    [id number] Date of Birth: 05/27/1956    Patient Gender: M Patient Age:   77 years Exam Location:  [facility] Procedure:      VAS [place] LOWER EXTREMITY VENOUS (DVT) Referring Phys: Jennette Kettle --------------------------------------------------------------------------------  Indications: Hx of PE. Other Indications: RLE cellulits. Risk Factors: HX of PE (2009) DVT HX of DVT (2021). Anticoagulation: Coumadin. Limitations: Body habitus and poor ultrasound/tissue interface. Comparison Study: Previous exam on 02/09/2020 was positive for DVT in LLE (FV,                   PopV, PeroV) Performing Technologist: Jody Hill RVT, RDMS  Examination Guidelines: A complete evaluation includes B-mode imaging, spectral Doppler, color Doppler, and power Doppler as needed of all accessible portions of each vessel. Bilateral testing  is considered an integral part of a complete examination. Limited examinations for reoccurring indications may be performed as noted. The reflux portion of the exam is performed with the patient in reverse Trendelenburg.  +---------+---------------+---------+-----------+----------+-------------------+  RIGHT     Compressibility Phasicity Spontaneity Properties Thrombus Aging       +---------+---------------+---------+-----------+----------+-------------------+  CFV       Full            Yes       Yes                                         +---------+---------------+---------+-----------+----------+-------------------+  SFJ       Full                                                                   +---------+---------------+---------+-----------+----------+-------------------+  FV Prox   Full            Yes       Yes                                         +---------+---------------+---------+-----------+----------+-------------------+  FV Mid    Full            Yes       Yes                                         +---------+---------------+---------+-----------+----------+-------------------+  FV Distal Full            Yes       Yes                                         +---------+---------------+---------+-----------+----------+-------------------+  PFV       Full                                                                  +---------+---------------+---------+-----------+----------+-------------------+  POP       Full            Yes       Yes                                         +---------+---------------+---------+-----------+----------+-------------------+  PTV       Full                                             Not well visualized  +---------+---------------+---------+-----------+----------+-------------------+  PERO  Full                                             Not well visualized  +---------+---------------+---------+-----------+----------+-------------------+   +---------+---------------+---------+-----------+----------+-----------------+  LEFT      Compressibility Phasicity Spontaneity Properties Thrombus Aging     +---------+---------------+---------+-----------+----------+-----------------+  CFV       Full            Yes       Yes                                       +---------+---------------+---------+-----------+----------+-----------------+  SFJ       Full                                                                +---------+---------------+---------+-----------+----------+-----------------+  FV Prox   Full            Yes       Yes                                       +---------+---------------+---------+-----------+----------+-----------------+  FV Mid    None             No        No                     Age Indeterminate  +---------+---------------+---------+-----------+----------+-----------------+  FV Distal Partial         Yes       Yes                    Age Indeterminate  +---------+---------------+---------+-----------+----------+-----------------+  PFV       Full                                                                +---------+---------------+---------+-----------+----------+-----------------+  POP       Partial         Yes       Yes                    Age Indeterminate  +---------+---------------+---------+-----------+----------+-----------------+  PTV       Full                                                                +---------+---------------+---------+-----------+----------+-----------------+  PERO      Full                                                                +---------+---------------+---------+-----------+----------+-----------------+  Summary: BILATERAL: -No evidence of popliteal cyst, bilaterally. RIGHT: - There is no evidence of deep vein thrombosis in the lower extremity. - There is no evidence of superficial venous thrombosis.  subcutaneous edema seen in area of calf.  LEFT: - Findings consistent with age indeterminate deep vein thrombosis involving the left femoral vein, and left popliteal vein. - There is no evidence of superficial venous thrombosis.  *See table(s) above for measurements and observations.    Preliminary     Review of Systems  Constitutional:  Positive for activity change, appetite change, chills and fever.  HENT:  Negative for nosebleeds, sore throat and trouble swallowing.   Eyes:  Negative for photophobia and visual disturbance.  Respiratory:  Negative for cough, chest tightness and shortness of breath.   Cardiovascular:  Positive for leg swelling. Negative for chest pain.       Right leg with associated redness and pain  Gastrointestinal:  Negative for abdominal distention, constipation, diarrhea,  nausea and vomiting.  Endocrine: Negative for cold intolerance.  Genitourinary:  Negative for dysuria, frequency, hematuria and urgency.  Musculoskeletal:  Positive for myalgias.       Right leg  Skin:  Positive for color change.       Right leg  Neurological:  Positive for dizziness and weakness. Negative for tremors and light-headedness.  Blood pressure (!) 112/59, pulse (!) 106, temperature 98.3 F (36.8 C), temperature source Oral, resp. rate (!) 23, SpO2 95 %. Physical Exam Vitals and nursing note reviewed.  Constitutional:      General: He is not in acute distress.    Appearance: Normal appearance. He is obese. He is not toxic-appearing.  HENT:     Head: Normocephalic and atraumatic.     Right Ear: External ear normal.     Left Ear: External ear normal.     Nose: Nose normal.     Mouth/Throat:     Mouth: Mucous membranes are dry.     Pharynx: Oropharynx is clear.  Eyes:     Extraocular Movements: Extraocular movements intact.     Conjunctiva/sclera: Conjunctivae normal.  Cardiovascular:     Rate and Rhythm: Normal rate and regular rhythm.     Pulses: Normal pulses.     Heart sounds: Normal heart sounds.  Pulmonary:     Effort: Pulmonary effort is normal.     Breath sounds: Normal breath sounds. No wheezing or rales.  Abdominal:     General: Abdomen is flat. Bowel sounds are normal. There is no distension.     Palpations: Abdomen is soft.  Musculoskeletal:     Cervical back: Normal range of motion and neck supple.     Comments: Erythematous, tender, warm to touch right leg with nonpitting edema.  Left leg with nonpitting edema  Skin:    General: Skin is warm and dry.     Findings: Erythema present.     Comments: Right leg erythema consistent with cellulitis  Neurological:     General: No focal deficit present.     Mental Status: He is alert and oriented to person, place, and time.  Psychiatric:        Mood and Affect: Mood normal.    Assessment/Plan: 1.  Acute  kidney injury on chronic kidney disease stage IIIb: Appears nonoliguric and possibly hemodynamically mediated in the setting of acute illness/sepsis.  Agree with intravenous fluids and will recheck labs today to follow kidney function.  His urinalysis and urine microscopy does not point towards acute GN  and screening for this will be deferred.  I will obtain a renal ultrasound tomorrow if renal function does not improve with supportive management. Avoid nephrotoxic medications including NSAIDs and iodinated intravenous contrast exposure unless the latter is absolutely indicated.  Preferred narcotic agents for pain control are hydromorphone, fentanyl, and methadone. Morphine should not be used. Avoid Baclofen and avoid oral sodium phosphate and magnesium citrate based laxatives / bowel preps. Continue strict Input and Output monitoring. Will monitor the patient closely with you and intervene or adjust therapy as indicated by changes in clinical status/labs. 2.  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  He has been started on intravenous Ancef for management of cellulitis. 3.  Hypertension: Was hypotensive on admission likely associated with presentation with sepsis.  Agree with holding antihypertensive therapy particularly ARB at this time with acute kidney injury. 4.  History of PE/DVT: Resumed back on warfarin with ongoing pharmacy monitoring of PT/INR.     Guillaume Weninger K. 12/08/2021, 1:20 PM

## 2021-12-08 NOTE — Progress Notes (Signed)
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  Patient reports he woke up started noticing that his right leg is painful to touch and turning red.  He denies any trauma or injury to the leg. He went to his PCPs office, his blood pressure was found to be low despite not taking her blood pressure medications.  His PCP was concerned about sepsis  and was sent in the ED.  Patient is being admitted for sepsis sec. to RLE cellulitis and started on IV antibiotics.  Patient is on Coumadin for DVT.  Bilateral venous duplex completed, shows age-indeterminate DVT in the left leg. Right leg is erythematous swollen and tender.    Assessment & Plan:   Principal Problem:   Sepsis due to cellulitis [facility]) Active Problems:   History of pulmonary embolism   Essential hypertension   AKI (acute kidney injury) ([place])   Type 2 diabetes mellitus with diabetic polyneuropathy, with long-term current use of insulin (HCC)   Chronic kidney disease, stage 3b (HCC)   Cellulitis of right lower extremity  Severe Sepsis secondary to cellulitis: Patient met sepsis criteria at the time of admission. He presented with tachycardia, leukocytosis, lactic acid 2.4,  RLE cellulitis. He was initially hypotensive with SBP in the 90s which improved with IV fluid bolus. Continued Rocephin and vancomycin. Lactic acid normalized with IV hydration. Procalcitonin 9.86 , follow blood cultures. MRSA appears negative.  UA unremarkable. No evidence of necrotizing fasciitis or drainable abscess or surgical indication on CT scan at this time. Continue Ancef, vancomycin discontinued.  RLE cellulitis: Patient is on chronic anticoagulation for his prior DVT. CT lower  extremity suggest cellulitis. Continue IV Ancef.  Type 2 diabetes with neuropathy: Hold Trulicity. Continue NPH 20 units twice daily Continue regular insulin sliding scale  Acute kidney injury on CKD stage IIIb: Likely prerenal from sepsis. Continue IV hydration. Renal function trending up.2.35> 3.15 Avoid nephrotoxic medication  Essential hypertension: Will resume home blood pressure medications  History of pulmonary embolism: Continue home Coumadin. Venous duplex shows no evidence of DVT in the right leg Venous duplex shows evidence of age-indeterminate DVT in the left leg.   DVT prophylaxis: Coumadin Code Status: Full code. Family Communication:  No family at bed side. Disposition Plan:    Status is: Inpatient Remains inpatient appropriate because:  Admitted for sepsis sec. to right leg cellulitis requiring IV antibiotics.  Consultants:  None  Procedures: Bilateral venous duplex   Antimicrobials:   Anti-infectives (From admission, onward)    Start     Dose/Rate Route Frequency Ordered Stop   12/08/21 2200  ceFAZolin (ANCEF) IVPB 2g/100 mL premix        2 g 200 mL/hr over 30 Minutes Intravenous Every 12 hours 12/08/21 1149     12/08/21 0000  cefTRIAXone (ROCEPHIN) 2 g in sodium chloride 0.9 % 100 mL IVPB  Status:  Discontinued        2 g 200 mL/hr over 30 Minutes Intravenous Every 24 hours 12/07/21 0216 12/08/21 1149   12/07/21 2200  vancomycin (VANCOREADY) IVPB 1250 mg/250 mL  Status:  Discontinued        1,250 mg 166.7 mL/hr over 90 Minutes Intravenous Every 24 hours 12/07/21 0342 12/08/21 1149  12/07/21 0000  vancomycin (VANCOREADY) IVPB 2000 mg/400 mL        2,000 mg 200 mL/hr over 120 Minutes Intravenous  Once 12/06/21 2352 12/07/21 0229   12/06/21 2345  cefTRIAXone (ROCEPHIN) 2 g in sodium chloride 0.9 % 100 mL IVPB        2 g 200 mL/hr over 30 Minutes Intravenous  Once 12/06/21 2343 12/07/21 0150        Subjective: Patient was seen and examined  at bedside.  Overnight events noted.  Patient reports feeling much better. His right leg is still swollen,  warm and tender.  Objective: Vitals:   12/08/21 0100 12/08/21 0200 12/08/21 0300 12/08/21 0400  BP:  (!) 111/59  (!) 112/59  Pulse: (!) 113 (!) 106 (!) 104 (!) 106  Resp: (!) 23 (!) 23 (!) 22 (!) 23  Temp:    98.3 F (36.8 C)  TempSrc:    Oral  SpO2: 97% 96% 97% 95%    Intake/Output Summary (Last 24 hours) at 12/08/2021 1334 Last data filed at 12/08/2021 0900 Gross per 24 hour  Intake 240 ml  Output 1000 ml  Net -760 ml   There were no vitals filed for this visit.  Examination:  General exam: Appears comfortable, not in any acute distress. Respiratory system: Clear to auscultation bilaterally, respiratory for normal, RR 15. Cardiovascular system: S1-S2 heard, regular rate and rhythm, no murmur. Gastrointestinal system: Abdomen is soft, nontender, nondistended, BS+ Central nervous system: Alert and oriented x3 . No focal neurological deficits. Extremities: Edema+, Right LE erythematous, warm,  tender  and swollen Skin: No rashes, lesions or ulcers Psychiatry: Judgement and insight appear normal. Mood & affect appropriate.     Data Reviewed: I have personally reviewed following labs and imaging studies  CBC: Recent Labs  Lab 12/06/21 1216 12/06/21 2337 12/07/21 0235 12/08/21 0433  WBC 17.7* 17.3* 17.4* 12.9*  NEUTROABS 16.4* 16.3*  --   --   HGB 13.6 12.2* 11.4* 11.0*  HCT 41.8 38.5* 35.5* 33.3*  MCV 91.8 96.3 94.9 92.5  PLT 199.0 152 127* 932*   Basic Metabolic Panel: Recent Labs  Lab 12/06/21 1216 12/06/21 2337 12/07/21 0235 12/08/21 0433  NA 136 131* 132*  --   K 5.1 4.1 3.9  --   CL 102 106 105  --   CO2 22 15* 17*  --   GLUCOSE 224* 230* 195*  --   BUN 36* 43* 43*  --   CREATININE 2.10* 2.62* 2.34* 3.15*  CALCIUM 9.3 8.0* 7.6*  --    GFR: Estimated Creatinine Clearance: 29.4 mL/min (A) (by C-G formula based on SCr of 3.15 mg/dL (H)). Liver  Function Tests: Recent Labs  Lab 12/06/21 1216 12/06/21 2337  AST 15 21  ALT 19 21  ALKPHOS 71 50  BILITOT 1.1 0.7  PROT 7.6 6.9  ALBUMIN 4.3 3.3*   No results for input(s): LIPASE, AMYLASE in the last 168 hours. No results for input(s): AMMONIA in the last 168 hours. Coagulation Profile: Recent Labs  Lab 12/06/21 2346 12/07/21 0235 12/07/21 0434 12/08/21 0433  INR 1.6* 1.7* 1.6* 1.9*   Cardiac Enzymes: No results for input(s): CKTOTAL, CKMB, CKMBINDEX, TROPONINI in the last 168 hours. BNP (last 3 results) No results for input(s): PROBNP in the last 8760 hours. HbA1C: Recent Labs    12/06/21 2337  HGBA1C 6.4*   CBG: Recent Labs  Lab 12/07/21 1158 12/07/21 1732 12/07/21 2143 12/08/21 0822 12/08/21 1210  GLUCAP 160* 129* 113* 109*  115*   Lipid Profile: No results for input(s): CHOL, HDL, LDLCALC, TRIG, CHOLHDL, LDLDIRECT in the last 72 hours. Thyroid Function Tests: No results for input(s): TSH, T4TOTAL, FREET4, T3FREE, THYROIDAB in the last 72 hours. Anemia Panel: No results for input(s): VITAMINB12, FOLATE, FERRITIN, TIBC, IRON, RETICCTPCT in the last 72 hours. Sepsis Labs: Recent Labs  Lab 12/06/21 2337 12/07/21 0142 12/07/21 0235 12/07/21 0533 12/07/21 0806  PROCALCITON  --   --  9.86  --   --   LATICACIDVEN 2.4* 3.0*  --  1.8 1.9    Recent Results (from the past 240 hour(s))  Blood Culture (routine x 2)     Status: None (Preliminary result)   Collection Time: 12/06/21 11:37 PM   Specimen: BLOOD RIGHT WRIST  Result Value Ref Range Status   Specimen Description   Final    BLOOD RIGHT WRIST Performed at [facility] [address], [place], [place] [address]    Special Requests   Final    BOTTLES DRAWN AEROBIC AND ANAEROBIC Blood Culture adequate volume Performed at [facility] [address] [place], [place] [address]    Culture   Final    NO GROWTH 1 DAY Performed at [facility], [place] [address], [place], [place] [address]    Report Status PENDING  Incomplete  Blood Culture (routine x 2)     Status: None (Preliminary result)   Collection Time: 12/06/21 11:37 PM   Specimen: Left Antecubital; Blood  Result Value Ref Range Status   Specimen Description   Final    LEFT ANTECUBITAL Performed at [facility] [address] [place], [place] [address]    Special Requests   Final    BOTTLES DRAWN AEROBIC AND ANAEROBIC Blood Culture adequate volume Performed at [facility] [address] [place], [place] [address]    Culture   Final    NO GROWTH 1 DAY Performed at [facility], [place] [address] [place], [place] [address]    Report Status PENDING  Incomplete  Resp Panel by RT-PCR (Flu A&B, Covid) Nasopharyngeal Swab     Status: None   Collection Time: 12/06/21 11:56 PM   Specimen: Nasopharyngeal Swab; Nasopharyngeal(NP) swabs in vial transport medium  Result Value Ref Range Status   SARS Coronavirus 2 by RT PCR NEGATIVE NEGATIVE Final    Comment: (NOTE) SARS-CoV-2 target nucleic acids are NOT DETECTED.  The SARS-CoV-2 RNA is generally detectable in upper respiratory specimens during the acute phase of infection. The lowest concentration of SARS-CoV-2 viral copies this assay can detect is 138 copies/mL. A negative result does not preclude SARS-Cov-2 infection and should not be used as the sole basis for treatment or other patient management decisions. A negative result may occur with  improper specimen collection/handling, submission of specimen other than nasopharyngeal swab, presence of viral mutation(s) within the areas targeted by this assay, and inadequate number of viral copies(<138 copies/mL). A negative result must be combined with clinical observations, patient history, and epidemiological information. The expected result is Negative.  Fact Sheet for Patients:  EntrepreneurPulse.com.au  Fact Sheet  for Healthcare Providers:  IncredibleEmployment.be  This test is no t yet approved or cleared by the [place] FDA and  has been authorized for detection and/or diagnosis of SARS-CoV-2 by FDA under an Emergency Use Authorization (EUA). This EUA will remain  in effect (meaning this test can be used) for the duration of the COVID-19 declaration  under Section 564(b)(1) of the Act, 21 U.S.C.section 360bbb-3(b)(1), unless the authorization is terminated  or revoked sooner.       Influenza A by PCR NEGATIVE NEGATIVE Final   Influenza B by PCR NEGATIVE NEGATIVE Final    Comment: (NOTE) The Xpert Xpress SARS-CoV-2/FLU/RSV plus assay is intended as an aid in the diagnosis of influenza from Nasopharyngeal swab specimens and should not be used as a sole basis for treatment. Nasal washings and aspirates are unacceptable for Xpert Xpress SARS-CoV-2/FLU/RSV testing.  Fact Sheet for Patients: EntrepreneurPulse.com.au  Fact Sheet for Healthcare Providers: IncredibleEmployment.be  This test is not yet approved or cleared by the [place] FDA and has been authorized for detection and/or diagnosis of SARS-CoV-2 by FDA under an Emergency Use Authorization (EUA). This EUA will remain in effect (meaning this test can be used) for the duration of the COVID-19 declaration under Section 564(b)(1) of the Act, 21 U.S.C. section 360bbb-3(b)(1), unless the authorization is terminated or revoked.  Performed at [facility], [place] [address] [place], [place] [address]   Urine Culture     Status: None   Collection Time: 12/07/21  2:37 AM   Specimen: In/Out Cath Urine  Result Value Ref Range Status   Specimen Description   Final    IN/OUT CATH URINE Performed at [facility] [address] [place], [place] [address]    Special Requests   Final    NONE Performed at [facility], [place] [address] [place], [place] [address]    Culture   Final    NO GROWTH Performed at [facility], [place] [address] [place], [place] [address]    Report Status 12/08/2021 FINAL  Final  MRSA Next Gen by PCR, Nasal     Status: None   Collection Time: 12/07/21  2:51 AM   Specimen: Nasal Mucosa; Nasal Swab  Result Value Ref Range Status   MRSA by PCR Next Gen NOT DETECTED NOT DETECTED Final    Comment: (NOTE) The GeneXpert MRSA Assay (FDA approved for NASAL specimens only), is one component of a comprehensive MRSA colonization surveillance program. It is not intended to diagnose MRSA infection nor to guide or monitor treatment for MRSA infections. Test performance is not FDA approved in patients less than 8 years old. Performed at [facility], [place] [address], [place], [place] [address]          Radiology Studies: DG Chest Port 1 View  Result Date: 12/07/2021 CLINICAL DATA:  Possible sepsis EXAM: PORTABLE CHEST 1 VIEW COMPARISON:  03/30/2021 FINDINGS: The heart size and mediastinal contours are within normal limits. Both lungs are clear. The visualized skeletal structures are unremarkable. IMPRESSION: No active disease. Electronically Signed   By: Inez Catalina M.D.   On: 12/07/2021 00:02   CT EXTREMITY LOWER RIGHT WO CONTRAST  Result Date: 12/07/2021 CLINICAL DATA:  Right lower extremity cellulitis, leukocytosis, on antibiotics EXAM: CT OF THE LOWER RIGHT EXTREMITY WITHOUT CONTRAST TECHNIQUE: Multidetector CT imaging of the right lower extremity was performed according to the standard protocol. RADIATION DOSE REDUCTION: This exam was performed according to the departmental dose-optimization program which includes automated exposure control, adjustment of the mA and/or kV according to patient size and/or use of iterative reconstruction technique. COMPARISON:  None. FINDINGS: No fracture is seen. No cortical destruction to suggest acute osteomyelitis. No drainable  fluid collection/abscess. Subcutaneous edema with trace fluid along the medial aspect of the calf (series 4/image 414),  with overlying mild skin thickening (series 4/image 412), corresponding to the clinical history of cellulitis. Additional subcutaneous edema/cutaneous thickening along the medial and lateral ankle (series 4/image 493). IMPRESSION: Suspected cellulitis along the medial calf. No drainable fluid collection/abscess. No acute osseous abnormality Electronically Signed   By: Julian Hy M.D.   On: 12/07/2021 02:02   VAS [place] LOWER EXTREMITY VENOUS (DVT)  Result Date: 12/07/2021  Lower Venous DVT Study Patient Name:  TAIVON HAROON  Date of Exam:   12/07/2021 Medical Rec #: [id number]    Accession #:    [id number] Date of Birth: 17-Sep-1956    Patient Gender: M Patient Age:   57 years Exam Location:  [facility] Procedure:      VAS [place] LOWER EXTREMITY VENOUS (DVT) Referring Phys: Jennette Kettle --------------------------------------------------------------------------------  Indications: Hx of PE. Other Indications: RLE cellulits. Risk Factors: HX of PE (2009) DVT HX of DVT (2021). Anticoagulation: Coumadin. Limitations: Body habitus and poor ultrasound/tissue interface. Comparison Study: Previous exam on 02/09/2020 was positive for DVT in LLE (FV,                   PopV, PeroV) Performing Technologist: Jody Hill RVT, RDMS  Examination Guidelines: A complete evaluation includes B-mode imaging, spectral Doppler, color Doppler, and power Doppler as needed of all accessible portions of each vessel. Bilateral testing is considered an integral part of a complete examination. Limited examinations for reoccurring indications may be performed as noted. The reflux portion of the exam is performed with the patient in reverse Trendelenburg.  +---------+---------------+---------+-----------+----------+-------------------+  RIGHT     Compressibility Phasicity Spontaneity Properties Thrombus Aging        +---------+---------------+---------+-----------+----------+-------------------+  CFV       Full            Yes       Yes                                         +---------+---------------+---------+-----------+----------+-------------------+  SFJ       Full                                                                  +---------+---------------+---------+-----------+----------+-------------------+  FV Prox   Full            Yes       Yes                                         +---------+---------------+---------+-----------+----------+-------------------+  FV Mid    Full            Yes       Yes                                         +---------+---------------+---------+-----------+----------+-------------------+  FV Distal Full            Yes       Yes                                         +---------+---------------+---------+-----------+----------+-------------------+  PFV       Full                                                                  +---------+---------------+---------+-----------+----------+-------------------+  POP       Full            Yes       Yes                                         +---------+---------------+---------+-----------+----------+-------------------+  PTV       Full                                             Not well visualized  +---------+---------------+---------+-----------+----------+-------------------+  PERO      Full                                             Not well visualized  +---------+---------------+---------+-----------+----------+-------------------+   +---------+---------------+---------+-----------+----------+-----------------+  LEFT      Compressibility Phasicity Spontaneity Properties Thrombus Aging     +---------+---------------+---------+-----------+----------+-----------------+  CFV       Full            Yes       Yes                                       +---------+---------------+---------+-----------+----------+-----------------+  SFJ        Full                                                                +---------+---------------+---------+-----------+----------+-----------------+  FV Prox   Full            Yes       Yes                                       +---------+---------------+---------+-----------+----------+-----------------+  FV Mid    None            No        No                     Age Indeterminate  +---------+---------------+---------+-----------+----------+-----------------+  FV Distal Partial         Yes       Yes                    Age Indeterminate  +---------+---------------+---------+-----------+----------+-----------------+  PFV       Full                                                                +---------+---------------+---------+-----------+----------+-----------------+  POP       Partial         Yes       Yes                    Age Indeterminate  +---------+---------------+---------+-----------+----------+-----------------+  PTV       Full                                                                +---------+---------------+---------+-----------+----------+-----------------+  PERO      Full                                                                +---------+---------------+---------+-----------+----------+-----------------+     Summary: BILATERAL: -No evidence of popliteal cyst, bilaterally. RIGHT: - There is no evidence of deep vein thrombosis in the lower extremity. - There is no evidence of superficial venous thrombosis.  subcutaneous edema seen in area of calf.  LEFT: - Findings consistent with age indeterminate deep vein thrombosis involving the left femoral vein, and left popliteal vein. - There is no evidence of superficial venous thrombosis.  *See table(s) above for measurements and observations.    Preliminary     Scheduled Meds:  enoxaparin (LOVENOX) injection  100 mg Subcutaneous Q12H   insulin aspart  0-15 Units Subcutaneous TID WC   insulin aspart  4 Units Subcutaneous TID WC   insulin  NPH Human  20 Units Subcutaneous BID AC & HS   Warfarin - Pharmacist Dosing Inpatient   Does not apply q1600   Continuous Infusions:   ceFAZolin (ANCEF) IV     lactated ringers       LOS: 1 day    Time spent: 50 mins    Leitha Hyppolite, MD [facility]   If 7PM-7AM, please contact night-coverage

## 2021-12-08 NOTE — Progress Notes (Signed)
[facility] for Lovenox/Coumadin  Indication: pulmonary embolus  No Known Allergies  Patient Measurements:   Vital Signs: Temp: 100.1 F (37.8 C) (02/04 1500) Temp Source: Oral (02/04 1500) BP: 124/73 (02/04 1500) Pulse Rate: 108 (02/04 1500)  Labs: Recent Labs    12/06/21 2337 12/06/21 2346 12/06/21 2346 12/07/21 0235 12/07/21 0434 12/08/21 0433 12/08/21 1330  HGB 12.2*  --   --  11.4*  --  11.0*  --   HCT 38.5*  --   --  35.5*  --  33.3*  --   PLT 152  --   --  127*  --  124*  --   APTT  --  35  --   --   --   --   --   LABPROT  --  19.5*   < > 20.1* 19.4* 22.1*  --   INR  --  1.6*   < > 1.7* 1.6* 1.9*  --   CREATININE 2.62*  --   --  2.34*  --  3.15* 2.99*   < > = values in this interval not displayed.     Estimated Creatinine Clearance: 31 mL/min (A) (by C-G formula based on SCr of 2.99 mg/dL (H)).  Medications:  Coumadin 5mg  tab daily except 7.5mg  tab on Thursdays and Saturdays per coumadin clinic note  Assessment: Patient on Coumadin for PE. INR subtherapeutic on admission. Admitted for sepsis due to cellulitis. Started on broad spectrum antibiotics which can increase INR. Acute kidney injury noted, CrCl > 30 mL/min. Hb and Plt WNL.   Today, 12/08/2021: INR remains subtherapeutic but trending up appropriately Continues on full-dose enoxaparin while INR low Hgb/Plt both slightly low but stable from yesterday No major DDI with warfarin noted, although abx can increase warfarin sensitivity Charting 100% of meals eaten  Goal of Therapy:  INR 2-3  Plan:  Coumadin 5 mg PO x 1 today Lovenox 100mg  SubQ q12 until INR >2 Daily INR Monitor CBC & renal function  Amira Podolak, Cindie Laroche, PharmD student  12/08/2021,5:06 PM

## 2021-12-09 DIAGNOSIS — A419 Sepsis, unspecified organism: Secondary | ICD-10-CM | POA: Diagnosis not present

## 2021-12-09 DIAGNOSIS — L0390 Cellulitis, unspecified: Secondary | ICD-10-CM | POA: Diagnosis not present

## 2021-12-09 LAB — RENAL FUNCTION PANEL
Albumin: 2.7 g/dL — ABNORMAL LOW (ref 3.5–5.0)
Anion gap: 11 (ref 5–15)
BUN: 56 mg/dL — ABNORMAL HIGH (ref 8–23)
CO2: 19 mmol/L — ABNORMAL LOW (ref 22–32)
Calcium: 8.2 mg/dL — ABNORMAL LOW (ref 8.9–10.3)
Chloride: 108 mmol/L (ref 98–111)
Creatinine, Ser: 2.84 mg/dL — ABNORMAL HIGH (ref 0.61–1.24)
GFR, Estimated: 24 mL/min — ABNORMAL LOW (ref >60)
Glucose, Bld: 96 mg/dL (ref 70–99)
Phosphorus: 3 mg/dL (ref 2.5–4.6)
Potassium: 3.7 mmol/L (ref 3.5–5.1)
Sodium: 138 mmol/L (ref 135–145)

## 2021-12-09 LAB — GLUCOSE, CAPILLARY
Glucose-Capillary: 95 mg/dL (ref 70–99)
Glucose-Capillary: 151 mg/dL — ABNORMAL HIGH (ref 70–99)
Glucose-Capillary: 110 mg/dL — ABNORMAL HIGH (ref 70–99)

## 2021-12-09 LAB — MAGNESIUM: Magnesium: 2 mg/dL (ref 1.7–2.4)

## 2021-12-09 LAB — PHOSPHORUS: Phosphorus: 3.1 mg/dL (ref 2.5–4.6)

## 2021-12-09 LAB — PROTIME-INR
INR: 2.2 — ABNORMAL HIGH (ref 0.8–1.2)
Prothrombin Time: 24.7 seconds — ABNORMAL HIGH (ref 11.4–15.2)

## 2021-12-09 MED ORDER — WARFARIN SODIUM 5 MG PO TABS
5.0000 mg | ORAL_TABLET | Freq: Once | ORAL | Status: AC
  Administered 2021-12-09: 5 mg via ORAL
  Filled 2021-12-09: qty 1

## 2021-12-09 NOTE — Progress Notes (Signed)
Around 2100 pt scored a "Red" MEWs, HR 122, BP 132/70, Temp 100.9 and RR 35. pt is not in distress, complains of moderate pain to right leg and has been tachycardic since admission. on IVF LR at 163ml/hr and Ancef. will address issues with Tylenol -it's been effective   On call APP [place], NP notified. No new orders.  Pt temp decreased to 98.4. no s/s distress noted.

## 2021-12-09 NOTE — Progress Notes (Signed)
Patient ID: [name], male   DOB: 01-21-1956, 66 y.o.   MRN: [id number] [facility] Progress Note   Assessment/ Plan:   1.  Acute kidney injury on chronic kidney disease stage IIIb: Urine output not accurately charted but reportedly nonoliguric.  He appears to have suffered hemodynamically mediated renal injury/ATN in the setting of acute illness/sepsis.  Some improvement of renal function noted on labs this morning without any critical electrolyte abnormalities.  He is euvolemic on physical exam and I agree with discontinuing intravenous fluids and continued monitoring of renal function/urine output. 2.  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  On IV Ancef. 3.  Hypertension: Was hypotensive on admission likely associated with presentation with sepsis.  Agree with holding antihypertensive therapy particularly ARB at this time with acute kidney injury. 4.  History of PE/DVT: Resumed back on warfarin with ongoing pharmacy monitoring of PT/INR.  Subjective:   Febrile earlier this morning, remains on Ancef and hemodynamic parameters improved.  Denies chest pain or shortness of breath   Objective:   BP 127/71 (BP Location: Left Arm)    Pulse 91    Temp 98.4 F (36.9 C) (Oral)    Resp 20    Ht 6\' 1"  (1.854 m)    Wt 108.9 kg    SpO2 95%    BMI 31.67 kg/m   Intake/Output Summary (Last 24 hours) at 12/09/2021 1053 Last data filed at 12/09/2021 0300 Gross per 24 hour  Intake 1428.95 ml  Output --  Net 1428.95 ml   Weight change:   Physical Exam: Gen: Comfortably resting in bed, wife sitting at bedside CVS: Pulse regular rhythm, normal rate, S1 and S2 normal Resp: Clear to auscultation, no rales/rhonchi Abd: Soft, obese, nontender, bowel sounds normal Ext:  Visibly larger right lower extremity with associated erythema, shallow pretibial ulcer.  Left leg nonpitting edema  Imaging: No results found.  Labs: BMET Recent Labs  Lab 12/06/21 1216 12/06/21 2337 12/07/21 0235 12/08/21 0433 12/08/21 1330 12/09/21 0425  NA 136 131* 132*  --  134* 138  K 5.1 4.1 3.9  --  3.4* 3.7  CL 102 106 105  --  105 108  CO2 22 15* 17*  --  19* 19*  GLUCOSE 224* 230* 195*  --  112* 96  BUN 36* 43* 43*  --  59* 56*  CREATININE 2.10* 2.62* 2.34* 3.15* 2.99* 2.84*  CALCIUM 9.3 8.0* 7.6*  --  7.9* 8.2*  PHOS  --   --   --   --  2.6 3.1   3.0   CBC Recent Labs  Lab 12/06/21 1216 12/06/21 2337 12/07/21 0235 12/08/21 0433  WBC 17.7* 17.3* 17.4* 12.9*  NEUTROABS 16.4* 16.3*  --   --   HGB 13.6 12.2* 11.4* 11.0*  HCT 41.8 38.5* 35.5* 33.3*  MCV 91.8 96.3 94.9 92.5  PLT 199.0 152 127* 124*    Medications:     enoxaparin (LOVENOX) injection  100 mg Subcutaneous Q12H   insulin aspart  0-15 Units Subcutaneous TID WC   insulin aspart  4 Units Subcutaneous TID WC   insulin NPH Human  20 Units Subcutaneous BID AC & HS   Warfarin - Pharmacist Dosing Inpatient   Does not apply [id number]   Elmarie Shiley, MD 12/09/2021, 10:53 AM

## 2021-12-09 NOTE — Progress Notes (Signed)
ANTICOAGULATION CONSULT NOTE  Pharmacy Consult for Lovenox/Coumadin  Indication: pulmonary embolus  No Known Allergies  Patient Measurements: Height: 6\' 1"  (185.4 cm) Weight: 108.9 kg (240 lb 1.3 oz) IBW/kg (Calculated) : 79.9 Vital Signs: Temp: 98.4 F (36.9 C) (02/05 0537) Temp Source: Oral (02/05 0537) BP: 127/71 (02/05 0537) Pulse Rate: 91 (02/05 0537)  Labs: Recent Labs    12/06/21 2337 12/06/21 2346 12/06/21 2346 12/07/21 0235 12/07/21 0434 12/08/21 0433 12/08/21 1330 12/09/21 0425  HGB 12.2*  --   --  11.4*  --  11.0*  --   --   HCT 38.5*  --   --  35.5*  --  33.3*  --   --   PLT 152  --   --  127*  --  124*  --   --   APTT  --  35  --   --   --   --   --   --   LABPROT  --  19.5*   < > 20.1* 19.4* 22.1*  --  24.7*  INR  --  1.6*   < > 1.7* 1.6* 1.9*  --  2.2*  CREATININE 2.62*  --   --  2.34*  --  3.15* 2.99* 2.84*   < > = values in this interval not displayed.     Estimated Creatinine Clearance: 32.7 mL/min (A) (by C-G formula based on SCr of 2.84 mg/dL (H)).  Medications:  Coumadin 5mg  tab daily except 7.5mg  tab on Thursdays and Saturdays per coumadin clinic note  Assessment: Patient on Coumadin for PE. INR subtherapeutic on admission. Admitted for sepsis due to cellulitis. Started on broad spectrum antibiotics which can increase INR. Acute kidney injury noted, CrCl > 30 mL/min. Hb and Plt WNL.   Today, 12/09/2021: INR continues to trend up steadily; now therapeutic this AM Continues on full-dose enoxaparin for bridging No CBC today; Hgb/Plt both slightly low but stable yesterday No major DDI with warfarin noted, although recent broad-spectrum abx can increase warfarin sensitivity CrCl low but remains stable > 30 ml/min Charting 100% of meals eaten (sporadic charting)  Goal of Therapy:  INR 2-3  Plan:  Coumadin 5 mg PO x 1 today Stop Lovenox with INR >2 Daily INR Monitor CBC & renal function  Pavlos Yon A, PharmD student  12/09/2021,11:15  AM

## 2021-12-09 NOTE — Progress Notes (Signed)
PROGRESS NOTE    [name]  WPY:[id number] DOB: 12-20-1955 DOA: 12/06/2021  PCP: [name], MD   Brief Narrative:  This 66 years old male with PMH significant for type 2 diabetes, CKD stage IIIb, DVT/ PE on Coumadin, hypertension presented in the ED with acute onset of fever, tremors, started yesterday.  Patient reports he woke up started noticing that his right leg is painful to touch and turning red.  He denies any trauma or injury to the leg. He went to his PCPs office, his blood pressure was found to be low despite not taking her blood pressure medications.  His PCP was concerned about sepsis  and was sent in the ED.  Patient is being admitted for sepsis sec. to RLE cellulitis and started on IV antibiotics.  Patient is on Coumadin for DVT.  Bilateral venous duplex completed, shows age-indeterminate DVT in the left leg. Right leg is erythematous swollen and tender.    Assessment & Plan:   Principal Problem:   Sepsis due to cellulitis [facility]) Active Problems:   History of pulmonary embolism   Essential hypertension   AKI (acute kidney injury) ([place])   Type 2 diabetes mellitus with diabetic polyneuropathy, with long-term current use of insulin (HCC)   Chronic kidney disease, stage 3b (HCC)   Cellulitis of right lower extremity  Severe Sepsis secondary to cellulitis: Patient met sepsis criteria at the time of admission. He presented with tachycardia, leukocytosis, lactic acid 2.4,  RLE cellulitis. He was initially hypotensive with SBP in the 90s which improved with IV fluid bolus. Continued Rocephin and vancomycin. Lactic acid normalized with IV hydration. Procalcitonin 9.86 , follow blood cultures. MRSA nares: Negative.  UA unremarkable. No evidence of necrotizing fasciitis or drainable abscess or surgical indication on CT scan at this time. Continue Ancef, vancomycin discontinued. Blood cultures no growth to date  RLE cellulitis: Patient is on chronic anticoagulation  for his prior DVT. CT lower extremity suggest cellulitis. Continue IV Ancef.  Type 2 diabetes with neuropathy: Hold Trulicity. Continue NPH 20 units twice daily Continue regular insulin sliding scale.  Acute kidney injury on CKD stage IIIb: Likely prerenal from sepsis. Continue IV hydration. Renal function trending up.2.35> 3.15 > 2.99>2.84 Avoid nephrotoxic medication  Essential hypertension: BP controlled off meds. Will resume if BP not controlled.  History of pulmonary embolism: Continue home Coumadin. Venous duplex shows no evidence of DVT in the right leg Venous duplex shows evidence of age-indeterminate DVT in the left leg.   DVT prophylaxis: Coumadin Code Status: Full code. Family Communication:  No family at bed side. Disposition Plan:    Status is: Inpatient Remains inpatient appropriate because:  Admitted for sepsis sec. to right leg cellulitis requiring IV antibiotics.  Consultants:  None  Procedures: Bilateral venous duplex   Antimicrobials:   Anti-infectives (From admission, onward)    Start     Dose/Rate Route Frequency Ordered Stop   12/08/21 2200  ceFAZolin (ANCEF) IVPB 2g/100 mL premix        2 g 200 mL/hr over 30 Minutes Intravenous Every 12 hours 12/08/21 1149     12/08/21 0000  cefTRIAXone (ROCEPHIN) 2 g in sodium chloride 0.9 % 100 mL IVPB  Status:  Discontinued        2 g 200 mL/hr over 30 Minutes Intravenous Every 24 hours 12/07/21 0216 12/08/21 1149   12/07/21 2200  vancomycin (VANCOREADY) IVPB 1250 mg/250 mL  Status:  Discontinued        1,250 mg 166.7  mL/hr over 90 Minutes Intravenous Every 24 hours 12/07/21 0342 12/08/21 1149   12/07/21 0000  vancomycin (VANCOREADY) IVPB 2000 mg/400 mL        2,000 mg 200 mL/hr over 120 Minutes Intravenous  Once 12/06/21 2352 12/07/21 0229   12/06/21 2345  cefTRIAXone (ROCEPHIN) 2 g in sodium chloride 0.9 % 100 mL IVPB        2 g 200 mL/hr over 30 Minutes Intravenous  Once 12/06/21 2343 12/07/21 0150         Subjective: Patient was seen and examined at bedside.  Overnight events noted.   Patient reports feeling much better.  His right leg is still swollen,  tender,  warm and erythematous.  Objective: Vitals:   12/09/21 0000 12/09/21 0001 12/09/21 0100 12/09/21 0537  BP: 119/66   127/71  Pulse: (!) 101 (!) 102 85 91  Resp:   20   Temp: 98.4 F (36.9 C)   98.4 F (36.9 C)  TempSrc: Oral   Oral  SpO2: 92% 95% 93% 95%  Weight:      Height:        Intake/Output Summary (Last 24 hours) at 12/09/2021 1350 Last data filed at 12/09/2021 0300 Gross per 24 hour  Intake 1428.95 ml  Output --  Net 1428.95 ml   Filed Weights   12/07/21 2203  Weight: 108.9 kg    Examination:  General exam: Appears comfortable, not in any acute distress. Respiratory system: Clear to auscultation bilaterally, respiratory for normal, RR 13. Cardiovascular system: S1-S2 heard, regular rate and rhythm, no murmur. Gastrointestinal system: Abdomen is soft, nontender, nondistended, BS+ Central nervous system: Alert and oriented x3 . No focal neurological deficits. Extremities: Edema+, Right LE erythematous, warm,  tender  and swollen Skin: No rashes, lesions or ulcers Psychiatry: Judgement and insight appear normal. Mood & affect appropriate.     Data Reviewed: I have personally reviewed following labs and imaging studies  CBC: Recent Labs  Lab 12/06/21 1216 12/06/21 2337 12/07/21 0235 12/08/21 0433  WBC 17.7* 17.3* 17.4* 12.9*  NEUTROABS 16.4* 16.3*  --   --   HGB 13.6 12.2* 11.4* 11.0*  HCT 41.8 38.5* 35.5* 33.3*  MCV 91.8 96.3 94.9 92.5  PLT 199.0 152 127* 297*   Basic Metabolic Panel: Recent Labs  Lab 12/06/21 1216 12/06/21 2337 12/07/21 0235 12/08/21 0433 12/08/21 1330 12/09/21 0425  NA 136 131* 132*  --  134* 138  K 5.1 4.1 3.9  --  3.4* 3.7  CL 102 106 105  --  105 108  CO2 22 15* 17*  --  19* 19*  GLUCOSE 224* 230* 195*  --  112* 96  BUN 36* 43* 43*  --  59* 56*   CREATININE 2.10* 2.62* 2.34* 3.15* 2.99* 2.84*  CALCIUM 9.3 8.0* 7.6*  --  7.9* 8.2*  MG  --   --   --   --   --  2.0  PHOS  --   --   --   --  2.6 3.1   3.0   GFR: Estimated Creatinine Clearance: 32.7 mL/min (A) (by C-G formula based on SCr of 2.84 mg/dL (H)). Liver Function Tests: Recent Labs  Lab 12/06/21 1216 12/06/21 2337 12/08/21 1330 12/09/21 0425  AST 15 21  --   --   ALT 19 21  --   --   ALKPHOS 71 50  --   --   BILITOT 1.1 0.7  --   --   PROT 7.6  6.9  --   --   ALBUMIN 4.3 3.3* 2.9* 2.7*   No results for input(s): LIPASE, AMYLASE in the last 168 hours. No results for input(s): AMMONIA in the last 168 hours. Coagulation Profile: Recent Labs  Lab 12/06/21 2346 12/07/21 0235 12/07/21 0434 12/08/21 0433 12/09/21 0425  INR 1.6* 1.7* 1.6* 1.9* 2.2*   Cardiac Enzymes: No results for input(s): CKTOTAL, CKMB, CKMBINDEX, TROPONINI in the last 168 hours. BNP (last 3 results) No results for input(s): PROBNP in the last 8760 hours. HbA1C: Recent Labs    12/06/21 2337  HGBA1C 6.4*   CBG: Recent Labs  Lab 12/08/21 1210 12/08/21 1738 12/08/21 2044 12/09/21 0718 12/09/21 1156  GLUCAP 115* 95 125* 95 151*   Lipid Profile: No results for input(s): CHOL, HDL, LDLCALC, TRIG, CHOLHDL, LDLDIRECT in the last 72 hours. Thyroid Function Tests: No results for input(s): TSH, T4TOTAL, FREET4, T3FREE, THYROIDAB in the last 72 hours. Anemia Panel: No results for input(s): VITAMINB12, FOLATE, FERRITIN, TIBC, IRON, RETICCTPCT in the last 72 hours. Sepsis Labs: Recent Labs  Lab 12/06/21 2337 12/07/21 0142 12/07/21 0235 12/07/21 0533 12/07/21 0806  PROCALCITON  --   --  9.86  --   --   LATICACIDVEN 2.4* 3.0*  --  1.8 1.9    Recent Results (from the past 240 hour(s))  Blood Culture (routine x 2)     Status: None (Preliminary result)   Collection Time: 12/06/21 11:37 PM   Specimen: BLOOD RIGHT WRIST  Result Value Ref Range Status   Specimen Description   Final     BLOOD RIGHT WRIST Performed at [facility] [address], [place], [place] [address]    Special Requests   Final    BOTTLES DRAWN AEROBIC AND ANAEROBIC Blood Culture adequate volume Performed at [name] [address] [place], [place] [address]    Culture   Final    NO GROWTH 2 DAYS Performed at [facility] [address] [place], [place] [address]    Report Status PENDING  Incomplete  Blood Culture (routine x 2)     Status: None (Preliminary result)   Collection Time: 12/06/21 11:37 PM   Specimen: Left Antecubital; Blood  Result Value Ref Range Status   Specimen Description   Final    LEFT ANTECUBITAL Performed at [facility] [address] [place], [place] [address]    Special Requests   Final    BOTTLES DRAWN AEROBIC AND ANAEROBIC Blood Culture adequate volume Performed at [facility] [address], [place], [place] [address]    Culture   Final    NO GROWTH 2 DAYS Performed at [facility] [address] [place], [place] [address]    Report Status PENDING  Incomplete  Resp Panel by RT-PCR (Flu A&B, Covid) Nasopharyngeal Swab     Status: None   Collection Time: 12/06/21 11:56 PM   Specimen: Nasopharyngeal Swab; Nasopharyngeal(NP) swabs in vial transport medium  Result Value Ref Range Status   SARS Coronavirus 2 by RT PCR NEGATIVE NEGATIVE Final    Comment: (NOTE) SARS-CoV-2 target nucleic acids are NOT DETECTED.  The SARS-CoV-2 RNA is generally detectable in upper respiratory specimens during the acute phase of infection. The lowest concentration of SARS-CoV-2 viral copies this assay can detect is 138 copies/mL. A negative result does not preclude SARS-Cov-2 infection and should not be used as the sole basis for treatment or other patient management decisions.  A negative result may occur with  improper specimen collection/handling, submission of specimen  other than nasopharyngeal swab, presence of viral mutation(s) within the areas targeted by this assay, and inadequate number of viral copies(<138 copies/mL). A negative result must be combined with clinical observations, patient history, and epidemiological information. The expected result is Negative.  Fact Sheet for Patients:  EntrepreneurPulse.com.au  Fact Sheet for Healthcare Providers:  IncredibleEmployment.be  This test is no t yet approved or cleared by the [place] FDA and  has been authorized for detection and/or diagnosis of SARS-CoV-2 by FDA under an Emergency Use Authorization (EUA). This EUA will remain  in effect (meaning this test can be used) for the duration of the COVID-19 declaration under Section 564(b)(1) of the Act, 21 U.S.C.section 360bbb-3(b)(1), unless the authorization is terminated  or revoked sooner.       Influenza A by PCR NEGATIVE NEGATIVE Final   Influenza B by PCR NEGATIVE NEGATIVE Final    Comment: (NOTE) The Xpert Xpress SARS-CoV-2/FLU/RSV plus assay is intended as an aid in the diagnosis of influenza from Nasopharyngeal swab specimens and should not be used as a sole basis for treatment. Nasal washings and aspirates are unacceptable for Xpert Xpress SARS-CoV-2/FLU/RSV testing.  Fact Sheet for Patients: EntrepreneurPulse.com.au  Fact Sheet for Healthcare Providers: IncredibleEmployment.be  This test is not yet approved or cleared by the [place] FDA and has been authorized for detection and/or diagnosis of SARS-CoV-2 by FDA under an Emergency Use Authorization (EUA). This EUA will remain in effect (meaning this test can be used) for the duration of the COVID-19 declaration under Section 564(b)(1) of the Act, 21 U.S.C. section 360bbb-3(b)(1), unless the authorization is terminated or revoked.  Performed at [facility], [place] [address], [place], [place] [address]   Urine Culture     Status: None   Collection Time: 12/07/21  2:37 AM   Specimen: In/Out Cath Urine  Result Value Ref Range Status   Specimen Description   Final    IN/OUT CATH URINE Performed at [facility] [address] [place], [place] [address]    Special Requests   Final    NONE Performed at [facility], [place] [address] [place], [place] [address]    Culture   Final    NO GROWTH Performed at [facility], [place] [address] [place], [place] [address]    Report Status 12/08/2021 FINAL  Final  MRSA Next Gen by PCR, Nasal     Status: None   Collection Time: 12/07/21  2:51 AM   Specimen: Nasal Mucosa; Nasal Swab  Result Value Ref Range Status   MRSA by PCR Next Gen NOT DETECTED NOT DETECTED Final    Comment: (NOTE) The GeneXpert MRSA Assay (FDA approved for NASAL specimens only), is one component of a comprehensive MRSA colonization surveillance program. It is not intended to diagnose MRSA infection nor to guide or monitor treatment for MRSA infections. Test performance is not FDA approved in patients less than 65 years old. Performed at [facility], [place] [address] [place], [place] [address]     Radiology Studies: No results found.  Scheduled Meds:  insulin aspart  0-15 Units Subcutaneous TID WC   insulin aspart  4 Units Subcutaneous TID WC   insulin NPH Human  20 Units Subcutaneous BID AC & HS   warfarin  5 mg Oral ONCE-1600   Warfarin - Pharmacist Dosing Inpatient   Does not apply  q1600   Continuous Infusions:   ceFAZolin (ANCEF) IV 2 g (12/09/21 0911)     LOS: 2 days    Time spent: 35 mins    Aadvik Roker, MD [facility]   If 7PM-7AM, please contact night-coverage

## 2021-12-10 DIAGNOSIS — A419 Sepsis, unspecified organism: Secondary | ICD-10-CM | POA: Diagnosis not present

## 2021-12-10 DIAGNOSIS — L0390 Cellulitis, unspecified: Secondary | ICD-10-CM | POA: Diagnosis not present

## 2021-12-10 LAB — PROTIME-INR
INR: 2.3 — ABNORMAL HIGH (ref 0.8–1.2)
Prothrombin Time: 25.4 seconds — ABNORMAL HIGH (ref 11.4–15.2)

## 2021-12-10 LAB — BASIC METABOLIC PANEL
Anion gap: 11 (ref 5–15)
BUN: 50 mg/dL — ABNORMAL HIGH (ref 8–23)
CO2: 18 mmol/L — ABNORMAL LOW (ref 22–32)
Calcium: 7.7 mg/dL — ABNORMAL LOW (ref 8.9–10.3)
Chloride: 109 mmol/L (ref 98–111)
Creatinine, Ser: 2.35 mg/dL — ABNORMAL HIGH (ref 0.61–1.24)
GFR, Estimated: 30 mL/min — ABNORMAL LOW (ref >60)
Glucose, Bld: 91 mg/dL (ref 70–99)
Potassium: 3.4 mmol/L — ABNORMAL LOW (ref 3.5–5.1)
Sodium: 138 mmol/L (ref 135–145)

## 2021-12-10 LAB — GLUCOSE, CAPILLARY
Glucose-Capillary: 107 mg/dL — ABNORMAL HIGH (ref 70–99)
Glucose-Capillary: 183 mg/dL — ABNORMAL HIGH (ref 70–99)
Glucose-Capillary: 234 mg/dL — ABNORMAL HIGH (ref 70–99)
Glucose-Capillary: 83 mg/dL (ref 70–99)
Glucose-Capillary: 89 mg/dL (ref 70–99)

## 2021-12-10 MED ORDER — WARFARIN SODIUM 5 MG PO TABS
5.0000 mg | ORAL_TABLET | Freq: Once | ORAL | Status: AC
  Administered 2021-12-10: 5 mg via ORAL
  Filled 2021-12-10: qty 1

## 2021-12-10 MED ORDER — POTASSIUM CHLORIDE 20 MEQ PO PACK
40.0000 meq | PACK | Freq: Once | ORAL | Status: AC
Start: 2021-12-10 — End: 2021-12-10
  Administered 2021-12-10: 40 meq via ORAL
  Filled 2021-12-10: qty 2

## 2021-12-10 MED ORDER — CEFAZOLIN SODIUM-DEXTROSE 2-4 GM/100ML-% IV SOLN
2.0000 g | Freq: Three times a day (TID) | INTRAVENOUS | Status: AC
  Administered 2021-12-10 – 2021-12-12 (×8): 2 g via INTRAVENOUS
  Filled 2021-12-10 (×8): qty 100

## 2021-12-10 NOTE — Care Management Important Message (Signed)
Important Message  Patient Details IM Letter given to the Patient. Name: Christifer Chapdelaine MRN: [id number] Date of Birth: [birth year]   Medicare Important Message Given:  Yes     Kerin Salen 12/10/2021, 12:45 PM

## 2021-12-10 NOTE — Progress Notes (Signed)
PHARMACY NOTE:  ANTIMICROBIAL RENAL DOSAGE ADJUSTMENT  Current antimicrobial regimen includes a mismatch between antimicrobial dosage and estimated renal function.  As per policy approved by the Pharmacy & Therapeutics and Medical Executive Committees, the antimicrobial dosage will be adjusted accordingly.  Current antimicrobial dosage:  cefazolin 2 g iv q 12 hours  Indication: cellulitis  Renal Function:  Estimated Creatinine Clearance: 38.9 mL/min (A) (by C-G formula based on SCr of 2.35 mg/dL (H)). []      On intermittent HD, scheduled: []      On CRRT    Antimicrobial dosage has been changed to:  cefazolin 2 g iv q 8 hours  Additional comments:   Thank you for allowing pharmacy to be a part of this patient's care.  Napoleon Form, [facility] 12/10/2021 8:09 AM

## 2021-12-10 NOTE — Progress Notes (Signed)
Patient ID: [name], male   DOB: 09/24/1956, 66 y.o.   MRN: [id number] [name] Progress Note   Assessment/ Plan:   1.  Acute kidney injury on chronic kidney disease stage IIIb: Urine output not accurately charted but reportedly nonoliguric.  He appears to have suffered hemodynamically mediated renal injury/ATN in the setting of acute illness/sepsis.  He is euvolemic on physical exam and IVF's dc'd. Creat continues to improve to 2.3 today. B/l creatinine is 1.6- 1.8.  No further suggestions, we will sign off. Our office will contact pt to set up AKI/ CKD follow up visit.  2.  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  On IV Ancef. 3.  Hypertension: Was hypotensive on admission likely associated with presentation with sepsis.  Agree with holding antihypertensive therapy particularly ARB at this time with acute kidney injury. 4.  History of PE/DVT: Resumed back on warfarin with ongoing pharmacy monitoring of PT/INR.  Kelly Splinter, MD 12/10/2021, 6:32 PM      Subjective:   No c/o's, good UOP, creat down 2.3   Objective:   BP 136/74 (BP Location: Left Arm)    Pulse 86    Temp 98.7 F (37.1 C) (Oral)    Resp (!) 23    Ht 6\' 1"  (1.854 m)    Wt 105.3 kg    SpO2 98%    BMI 30.63 kg/m   Intake/Output Summary (Last 24 hours) at 12/10/2021 1831 Last data filed at 12/10/2021 1643 Gross per 24 hour  Intake 611.74 ml  Output 900 ml  Net -288.26 ml    Weight change:   Physical Exam: Gen: Comfortably resting in bed, wife sitting at bedside CVS: Pulse regular rhythm, normal rate, S1 and S2 normal Resp: Clear to auscultation, no rales/rhonchi Abd: Soft, obese, nontender, bowel sounds normal Ext: Visibly larger right lower extremity with associated erythema,  shallow pretibial ulcer.  Left leg nonpitting edema  Imaging: No results found.  Labs: BMET Recent Labs  Lab 12/06/21 1216 12/06/21 2337 12/07/21 0235 12/08/21 0433 12/08/21 1330 12/09/21 0425 12/10/21 0336  NA 136 131* 132*  --  134* 138 138  K 5.1 4.1 3.9  --  3.4* 3.7 3.4*  CL 102 106 105  --  105 108 109  CO2 22 15* 17*  --  19* 19* 18*  GLUCOSE 224* 230* 195*  --  112* 96 91  BUN 36* 43* 43*  --  59* 56* 50*  CREATININE 2.10* 2.62* 2.34* 3.15* 2.99* 2.84* 2.35*  CALCIUM 9.3 8.0* 7.6*  --  7.9* 8.2* 7.7*  PHOS  --   --   --   --  2.6 3.1   3.0  --     CBC Recent Labs  Lab 12/06/21 1216 12/06/21 2337 12/07/21 0235 12/08/21 0433  WBC 17.7* 17.3* 17.4* 12.9*  NEUTROABS 16.4* 16.3*  --   --   HGB 13.6 12.2* 11.4* 11.0*  HCT 41.8 38.5* 35.5* 33.3*  MCV 91.8 96.3 94.9 92.5  PLT 199.0 152 127* 124*     Medications:     insulin aspart  0-15 Units Subcutaneous TID WC   insulin aspart  4 Units Subcutaneous TID WC   insulin NPH Human  20 Units Subcutaneous BID AC & HS   Warfarin - Pharmacist Dosing Inpatient  Does not apply q1600

## 2021-12-10 NOTE — Progress Notes (Addendum)
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  Patient reports he woke up started noticing that his right leg is painful to touch and turning red.  He denies any trauma or injury to the leg. He went to his PCPs office, his blood pressure was found to be low despite not taking her blood pressure medications.  His PCP was concerned about sepsis  and was sent in the ED.  Patient is being admitted for sepsis sec. to RLE cellulitis and started on IV antibiotics.  Patient is on Coumadin for DVT.  Bilateral venous duplex completed, shows age-indeterminate DVT in the left leg. Right leg is erythematous swollen and tender.    Assessment & Plan:   Principal Problem:   Sepsis due to cellulitis [facility]) Active Problems:   History of pulmonary embolism   Essential hypertension   AKI (acute kidney injury) ([place])   Type 2 diabetes mellitus with diabetic polyneuropathy, with long-term current use of insulin (HCC)   Chronic kidney disease, stage 3b (HCC)   Cellulitis of right lower extremity  Severe Sepsis secondary to cellulitis: Patient met sepsis criteria at the time of admission. He presented with tachycardia, leukocytosis, lactic acid 2.4,  RLE cellulitis. He was initially hypotensive with SBP in the 90s which improved with IV fluid bolus. Continued Rocephin and vancomycin. Lactic acid normalized with IV hydration. Procalcitonin 9.86 , follow blood cultures. MRSA nares: Negative.  UA unremarkable. No evidence of necrotizing fasciitis or drainable abscess or surgical indication on CT scan at this time. Continue Ancef, vancomycin discontinued. Blood cultures no growth to date Sepsis physiology resolved  RLE cellulitis: Patient is  on chronic anticoagulation for his prior DVT. CT lower extremity suggest cellulitis. Continue IV Ancef.  Type 2 diabetes with neuropathy: Hold Trulicity. Continue NPH 20 units twice daily Continue regular insulin sliding scale.  Acute kidney injury on CKD stage IIIb: Likely prerenal from sepsis. Continue IV hydration. Renal function improving 2.35> 3.15 > 2.99>2.84>2.35 Avoid nephrotoxic medications.  Essential hypertension: BP controlled off meds. Will resume if BP not controlled.  History of pulmonary embolism: Continue home Coumadin. Venous duplex shows no evidence of DVT in the right leg Venous duplex shows evidence of age-indeterminate DVT in the left leg.   DVT prophylaxis: Coumadin Code Status: Full code. Family Communication:  No family at bed side. Disposition Plan:    Status is: Inpatient Remains inpatient appropriate because:  Admitted for sepsis sec. to right leg cellulitis requiring IV antibiotics.  Consultants:  Nephrology  Procedures: Bilateral venous duplex   Antimicrobials:   Anti-infectives (From admission, onward)    Start     Dose/Rate Route Frequency Ordered Stop   12/10/21 1400  ceFAZolin (ANCEF) IVPB 2g/100 mL premix        2 g 200 mL/hr over 30 Minutes Intravenous Every 8 hours 12/10/21 0807     12/08/21 2200  ceFAZolin (ANCEF) IVPB 2g/100 mL premix  Status:  Discontinued        2 g 200 mL/hr over 30 Minutes Intravenous Every 12 hours 12/08/21 1149 12/10/21 0807   12/08/21 0000  cefTRIAXone (ROCEPHIN) 2 g in sodium chloride 0.9 % 100 mL IVPB  Status:  Discontinued  2 g 200 mL/hr over 30 Minutes Intravenous Every 24 hours 12/07/21 0216 12/08/21 1149   12/07/21 2200  vancomycin (VANCOREADY) IVPB 1250 mg/250 mL  Status:  Discontinued        1,250 mg 166.7 mL/hr over 90 Minutes Intravenous Every 24 hours 12/07/21 0342 12/08/21 1149   12/07/21 0000  vancomycin (VANCOREADY) IVPB 2000 mg/400 mL        2,000 mg 200 mL/hr over 120 Minutes  Intravenous  Once 12/06/21 2352 12/07/21 0229   12/06/21 2345  cefTRIAXone (ROCEPHIN) 2 g in sodium chloride 0.9 % 100 mL IVPB        2 g 200 mL/hr over 30 Minutes Intravenous  Once 12/06/21 2343 12/07/21 0150        Subjective: Patient was seen and examined at bedside.  Overnight events noted.   He reports feeling better but his right leg is still swollen, tender, warm and erythematous.   Objective: Vitals:   12/10/21 0400 12/10/21 0500 12/10/21 0600 12/10/21 1318  BP: 130/85  129/76 136/74  Pulse: 88  94 86  Resp: (!) 25  17 (!) 23  Temp:    98.7 F (37.1 C)  TempSrc:    Oral  SpO2: 98%  96% 98%  Weight:  105.3 kg    Height:        Intake/Output Summary (Last 24 hours) at 12/10/2021 1356 Last data filed at 12/10/2021 0930 Gross per 24 hour  Intake 371.74 ml  Output 900 ml  Net -528.26 ml   Filed Weights   12/07/21 2203 12/10/21 0500  Weight: 108.9 kg 105.3 kg    Examination:  General exam: Appears comfortable, not in any acute distress. Respiratory system: Clear to auscultation bilaterally, respiratory effort normal, RR 12 Cardiovascular system: S1-S2 heard, regular rate and rhythm, no murmur. Gastrointestinal system: Abdomen is soft, non tender, non distended, BS+ Central nervous system: Alert and oriented x3 . No focal neurological deficits. Extremities: Right LE erythematous, warm, tender, swollen.  Edema+ Skin: No rashes, lesions or ulcers Psychiatry: Judgement and insight appear normal. Mood & affect appropriate.     Data Reviewed: I have personally reviewed following labs and imaging studies  CBC: Recent Labs  Lab 12/06/21 1216 12/06/21 2337 12/07/21 0235 12/08/21 0433  WBC 17.7* 17.3* 17.4* 12.9*  NEUTROABS 16.4* 16.3*  --   --   HGB 13.6 12.2* 11.4* 11.0*  HCT 41.8 38.5* 35.5* 33.3*  MCV 91.8 96.3 94.9 92.5  PLT 199.0 152 127* 960*   Basic Metabolic Panel: Recent Labs  Lab 12/06/21 2337 12/07/21 0235 12/08/21 0433 12/08/21 1330  12/09/21 0425 12/10/21 0336  NA 131* 132*  --  134* 138 138  K 4.1 3.9  --  3.4* 3.7 3.4*  CL 106 105  --  105 108 109  CO2 15* 17*  --  19* 19* 18*  GLUCOSE 230* 195*  --  112* 96 91  BUN 43* 43*  --  59* 56* 50*  CREATININE 2.62* 2.34* 3.15* 2.99* 2.84* 2.35*  CALCIUM 8.0* 7.6*  --  7.9* 8.2* 7.7*  MG  --   --   --   --  2.0  --   PHOS  --   --   --  2.6 3.1   3.0  --    GFR: Estimated Creatinine Clearance: 38.9 mL/min (A) (by C-G formula based on SCr of 2.35 mg/dL (H)). Liver Function Tests: Recent Labs  Lab 12/06/21 1216 12/06/21 2337 12/08/21 1330 12/09/21 0425  AST  15 21  --   --   ALT 19 21  --   --   ALKPHOS 71 50  --   --   BILITOT 1.1 0.7  --   --   PROT 7.6 6.9  --   --   ALBUMIN 4.3 3.3* 2.9* 2.7*   No results for input(s): LIPASE, AMYLASE in the last 168 hours. No results for input(s): AMMONIA in the last 168 hours. Coagulation Profile: Recent Labs  Lab 12/07/21 0235 12/07/21 0434 12/08/21 0433 12/09/21 0425 12/10/21 0336  INR 1.7* 1.6* 1.9* 2.2* 2.3*   Cardiac Enzymes: No results for input(s): CKTOTAL, CKMB, CKMBINDEX, TROPONINI in the last 168 hours. BNP (last 3 results) No results for input(s): PROBNP in the last 8760 hours. HbA1C: No results for input(s): HGBA1C in the last 72 hours.  CBG: Recent Labs  Lab 12/09/21 1156 12/09/21 1756 12/10/21 0007 12/10/21 0731 12/10/21 1119  GLUCAP 151* 110* 89 107* 183*   Lipid Profile: No results for input(s): CHOL, HDL, LDLCALC, TRIG, CHOLHDL, LDLDIRECT in the last 72 hours. Thyroid Function Tests: No results for input(s): TSH, T4TOTAL, FREET4, T3FREE, THYROIDAB in the last 72 hours. Anemia Panel: No results for input(s): VITAMINB12, FOLATE, FERRITIN, TIBC, IRON, RETICCTPCT in the last 72 hours. Sepsis Labs: Recent Labs  Lab 12/06/21 2337 12/07/21 0142 12/07/21 0235 12/07/21 0533 12/07/21 0806  PROCALCITON  --   --  9.86  --   --   LATICACIDVEN 2.4* 3.0*  --  1.8 1.9    Recent Results  (from the past 240 hour(s))  Blood Culture (routine x 2)     Status: None (Preliminary result)   Collection Time: 12/06/21 11:37 PM   Specimen: BLOOD RIGHT WRIST  Result Value Ref Range Status   Specimen Description   Final    BLOOD RIGHT WRIST Performed at [facility] [address], [place], [place] [address]    Special Requests   Final    BOTTLES DRAWN AEROBIC AND ANAEROBIC Blood Culture adequate volume Performed at [facility] [address], [place], [place] [address]    Culture   Final    NO GROWTH 3 DAYS Performed at [facility], [place] [address], [place], [place] [address]    Report Status PENDING  Incomplete  Blood Culture (routine x 2)     Status: None (Preliminary result)   Collection Time: 12/06/21 11:37 PM   Specimen: Left Antecubital; Blood  Result Value Ref Range Status   Specimen Description   Final    LEFT ANTECUBITAL Performed at [facility] [address] [place], [place] [address]    Special Requests   Final    BOTTLES DRAWN AEROBIC AND ANAEROBIC Blood Culture adequate volume Performed at [facility] [address], [place], [place] [address]    Culture   Final    NO GROWTH 3 DAYS Performed at [facility], [place] [address] [place],  [address]    Report Status PENDING  Incomplete  Resp Panel by RT-PCR (Flu A&B, Covid) Nasopharyngeal Swab     Status: None   Collection Time: 12/06/21 11:56 PM   Specimen: Nasopharyngeal Swab; Nasopharyngeal(NP) swabs in vial transport medium  Result Value Ref Range Status   SARS Coronavirus 2 by RT PCR NEGATIVE NEGATIVE Final    Comment: (NOTE) SARS-CoV-2 target nucleic acids are NOT DETECTED.  The SARS-CoV-2 RNA is generally detectable in upper respiratory specimens during the acute phase  of infection. The lowest concentration of SARS-CoV-2 viral copies this assay can detect is 138 copies/mL. A negative result does not  preclude SARS-Cov-2 infection and should not be used as the sole basis for treatment or other patient management decisions. A negative result may occur with  improper specimen collection/handling, submission of specimen other than nasopharyngeal swab, presence of viral mutation(s) within the areas targeted by this assay, and inadequate number of viral copies(<138 copies/mL). A negative result must be combined with clinical observations, patient history, and epidemiological information. The expected result is Negative.  Fact Sheet for Patients:  EntrepreneurPulse.com.au  Fact Sheet for Healthcare Providers:  IncredibleEmployment.be  This test is no t yet approved or cleared by the [place] FDA and  has been authorized for detection and/or diagnosis of SARS-CoV-2 by FDA under an Emergency Use Authorization (EUA). This EUA will remain  in effect (meaning this test can be used) for the duration of the COVID-19 declaration under Section 564(b)(1) of the Act, 21 U.S.C.section 360bbb-3(b)(1), unless the authorization is terminated  or revoked sooner.       Influenza A by PCR NEGATIVE NEGATIVE Final   Influenza B by PCR NEGATIVE NEGATIVE Final    Comment: (NOTE) The Xpert Xpress SARS-CoV-2/FLU/RSV plus assay is intended as an aid in the diagnosis of influenza from Nasopharyngeal swab specimens and should not be used as a sole basis for treatment. Nasal washings and aspirates are unacceptable for Xpert Xpress SARS-CoV-2/FLU/RSV testing.  Fact Sheet for Patients: EntrepreneurPulse.com.au  Fact Sheet for Healthcare Providers: IncredibleEmployment.be  This test is not yet approved or cleared by the [place] FDA and has been authorized for detection and/or diagnosis of SARS-CoV-2 by FDA under an Emergency Use Authorization (EUA). This EUA will remain in effect (meaning this test can be used) for the  duration of the COVID-19 declaration under Section 564(b)(1) of the Act, 21 U.S.C. section 360bbb-3(b)(1), unless the authorization is terminated or revoked.  Performed at [facility], [place] [address] [place], [place] [address]   Urine Culture     Status: None   Collection Time: 12/07/21  2:37 AM   Specimen: In/Out Cath Urine  Result Value Ref Range Status   Specimen Description   Final    IN/OUT CATH URINE Performed at [facility] [address], [place], [place] [address]    Special Requests   Final    NONE Performed at [facility], [place] [address], [place], [place] [address]    Culture   Final    NO GROWTH Performed at [facility], [place] [address], [place], [place] [address]    Report Status 12/08/2021 FINAL  Final  MRSA Next Gen by PCR, Nasal     Status: None   Collection Time: 12/07/21  2:51 AM   Specimen: Nasal Mucosa; Nasal Swab  Result Value Ref Range Status   MRSA by PCR Next Gen NOT DETECTED NOT DETECTED Final    Comment: (NOTE) The GeneXpert MRSA Assay (FDA approved for NASAL specimens only), is one component of a comprehensive MRSA colonization surveillance program. It is not intended to diagnose MRSA infection nor to guide or monitor treatment for MRSA infections. Test performance is not FDA approved in patients less than 76 years old. Performed at [facility], [place] [address] [place], [place] [address]     Radiology Studies: No results found.  Scheduled Meds:  insulin aspart  0-15 Units Subcutaneous TID WC   insulin  aspart  4 Units Subcutaneous TID WC   insulin NPH Human  20 Units Subcutaneous BID AC & HS   warfarin  5 mg Oral ONCE-1600   Warfarin - Pharmacist Dosing Inpatient   Does not apply q1600   Continuous Infusions:   ceFAZolin (ANCEF) IV 2 g (12/10/21 1259)     LOS: 3 days    Time spent: 35 mins    Keenen Roessner, MD [facility]   If  7PM-7AM, please contact night-coverage

## 2021-12-10 NOTE — TOC Initial Note (Signed)
Transition of Care [facility]) - Initial/Assessment Note    Patient Details  Name: [name] MRN: [id number] Date of Birth: 11/11/1955  Transition of [facility]) CM/SW Contact:    Leeroy Cha, RN Phone Number: 12/10/2021, 9:57 AM  Clinical Narrative:                  Transition of Care [facility]) Screening Note   Patient Details  Name: [name] Date of Birth: 1955-12-05   Transition of Care [facility]) CM/SW Contact:    Leeroy Cha, RN Phone Number: 12/10/2021, 9:58 AM    Transition of Care [facility]) has reviewed patient and no TOC needs have been identified at this time. We will continue to monitor patient advancement through interdisciplinary progression rounds. If new patient transition needs arise, please place a TOC consult.          Patient Goals and CMS Choice        Expected Discharge Plan and Services                                                Prior Living Arrangements/Services                       Activities of Daily Living Home Assistive Devices/Equipment: Eyeglasses, Cane (specify quad or straight), Walker (specify type) ADL Screening (condition at time of admission) Patient's cognitive ability adequate to safely complete daily activities?: Yes Is the patient deaf or have difficulty hearing?: No Does the patient have difficulty seeing, even when wearing glasses/contacts?: No Does the patient have difficulty concentrating, remembering, or making decisions?: No Patient able to express need for assistance with ADLs?: Yes Does the patient have difficulty dressing or bathing?: No Independently performs ADLs?: Yes (appropriate for developmental age) Does the patient have difficulty walking or climbing stairs?: No Weakness of Legs: Both (recent left knee replacement) Weakness of Arms/Hands: Both  Permission Sought/Granted                  Emotional Assessment              Admission diagnosis:  Sepsis due  to cellulitis ([place]) [L03.90, A41.9] Sepsis, due to unspecified organism, unspecified whether acute organ dysfunction present [facility]) [A41.9] Patient Active Problem List   Diagnosis Date Noted   Sepsis due to cellulitis ([place]) 12/07/2021   Cellulitis of right lower extremity 12/06/2021   Status post total left knee replacement 08/06/2021   Encounter for medication monitoring 07/10/2021   UTI (urinary tract infection) 10/09/2020   Acute UTI 10/08/2020   Ureterolithiasis    Gross hematuria 06/16/2020   Renal lithiasis 06/07/2020   Renal stone 06/06/2020   Type 2 diabetes mellitus with stage 3b chronic kidney disease, with long-term current use of insulin ([place]) 05/26/2020   Visual disturbance 03/30/2020   Chronic pain of left knee 03/30/2020   Snores 03/30/2020   Chronic kidney disease, stage 3b ([place]) 03/30/2020   Midline low back pain without sciatica 03/30/2020   Hospital discharge follow-up 02/25/2020   Type 2 diabetes mellitus with diabetic polyneuropathy, with long-term current use of insulin ([place]) 02/23/2020   Syncope 02/08/2020   Renal stones 01/30/2020   Diminished pulses in lower extremity 01/26/2020   AKI (acute kidney injury) ([place]) 01/26/2020   Hyperkalemia 01/05/2020   Long term (current) use of anticoagulants 12/22/2019  Elevated cholesterol 12/17/2019   Primary osteoarthritis of left knee 12/17/2019   Arthritis 12/17/2019   Type 2 diabetes mellitus with hyperglycemia, with long-term current use of insulin ([place]) 12/17/2019   History of pulmonary embolism 12/17/2019   Essential hypertension 12/17/2019   PCP:  [name], MD Pharmacy:   [id number] - , [place] [address] Phone: [phone] Fax: [phone]     Social Determinants of Health (SDOH) Interventions    Readmission Risk Interventions No flowsheet data found.

## 2021-12-10 NOTE — Progress Notes (Signed)
ANTICOAGULATION CONSULT NOTE  Pharmacy Consult for Coumadin  Indication: pulmonary embolus  No Known Allergies  Patient Measurements: Height: 6\' 1"  (185.4 cm) Weight: 105.3 kg (232 lb 2.3 oz) IBW/kg (Calculated) : 79.9 Vital Signs: Temp: 99.8 F (37.7 C) (02/05 2000) Temp Source: Oral (02/05 2000) BP: 129/76 (02/06 0600) Pulse Rate: 94 (02/06 0600)  Labs: Recent Labs    12/08/21 0433 12/08/21 1330 12/09/21 0425 12/10/21 0336  HGB 11.0*  --   --   --   HCT 33.3*  --   --   --   PLT 124*  --   --   --   LABPROT 22.1*  --  24.7* 25.4*  INR 1.9*  --  2.2* 2.3*  CREATININE 3.15* 2.99* 2.84* 2.35*     Estimated Creatinine Clearance: 38.9 mL/min (A) (by C-G formula based on SCr of 2.35 mg/dL (H)).  Medications:  Coumadin 5mg  tab daily except 7.5mg  tab on Thursdays and Saturdays per coumadin clinic note  Assessment: Patient on Coumadin for PE. INR subtherapeutic on admission. Admitted for sepsis due to cellulitis. Started on broad spectrum antibiotics which can increase INR. Acute kidney injury noted, CrCl > 30 mL/min. Hb and Plt WNL.   Today, 12/10/2021: INR remains therapeutic, up slightly No major DDI with warfarin noted, although recent broad-spectrum abx can increase warfarin sensitivity Charting 100% of meals eaten (sporadic charting)  Goal of Therapy:  INR 2-3  Plan:  Coumadin 5 mg PO x 1 today Daily INR Monitor CBC & renal function  Ulice Dash D  12/10/2021,7:58 AM

## 2021-12-11 LAB — CBC
HCT: 36.1 % — ABNORMAL LOW (ref 39.0–52.0)
Hemoglobin: 11.7 g/dL — ABNORMAL LOW (ref 13.0–17.0)
MCH: 29.8 pg (ref 26.0–34.0)
MCHC: 32.4 g/dL (ref 30.0–36.0)
MCV: 91.9 fL (ref 80.0–100.0)
Platelets: 202 10*3/uL (ref 150–400)
RBC: 3.93 MIL/uL — ABNORMAL LOW (ref 4.22–5.81)
RDW: 14.5 % (ref 11.5–15.5)
WBC: 10.5 10*3/uL (ref 4.0–10.5)
nRBC: 0 % (ref 0.0–0.2)

## 2021-12-11 LAB — BASIC METABOLIC PANEL
Anion gap: 8 (ref 5–15)
BUN: 48 mg/dL — ABNORMAL HIGH (ref 8–23)
CO2: 19 mmol/L — ABNORMAL LOW (ref 22–32)
Calcium: 7.9 mg/dL — ABNORMAL LOW (ref 8.9–10.3)
Chloride: 111 mmol/L (ref 98–111)
Creatinine, Ser: 2.07 mg/dL — ABNORMAL HIGH (ref 0.61–1.24)
GFR, Estimated: 34 mL/min — ABNORMAL LOW (ref >60)
Glucose, Bld: 140 mg/dL — ABNORMAL HIGH (ref 70–99)
Potassium: 3.8 mmol/L (ref 3.5–5.1)
Sodium: 138 mmol/L (ref 135–145)

## 2021-12-11 LAB — GLUCOSE, CAPILLARY
Glucose-Capillary: 135 mg/dL — ABNORMAL HIGH (ref 70–99)
Glucose-Capillary: 174 mg/dL — ABNORMAL HIGH (ref 70–99)
Glucose-Capillary: 112 mg/dL — ABNORMAL HIGH (ref 70–99)
Glucose-Capillary: 146 mg/dL — ABNORMAL HIGH (ref 70–99)

## 2021-12-11 LAB — PROTIME-INR
INR: 2.7 — ABNORMAL HIGH (ref 0.8–1.2)
Prothrombin Time: 29 seconds — ABNORMAL HIGH (ref 11.4–15.2)

## 2021-12-11 MED ORDER — WARFARIN SODIUM 4 MG PO TABS
4.0000 mg | ORAL_TABLET | Freq: Once | ORAL | Status: AC
  Administered 2021-12-11: 4 mg via ORAL
  Filled 2021-12-11: qty 1

## 2021-12-11 MED ORDER — LINEZOLID 600 MG PO TABS
600.0000 mg | ORAL_TABLET | Freq: Two times a day (BID) | ORAL | Status: AC
  Administered 2021-12-11 – 2021-12-12 (×4): 600 mg via ORAL
  Filled 2021-12-11 (×4): qty 1

## 2021-12-11 NOTE — Progress Notes (Signed)
[facility] for Coumadin  Indication: pulmonary embolus  No Known Allergies  Patient Measurements: Height: 6\' 1"  (185.4 cm) Weight: 105.3 kg (232 lb 2.3 oz) IBW/kg (Calculated) : 79.9 Vital Signs: Temp: 98.4 F (36.9 C) (02/07 0636) Temp Source: Oral (02/07 0636) BP: 133/75 (02/07 0636) Pulse Rate: 80 (02/07 0636)  Labs: Recent Labs    12/09/21 0425 12/10/21 0336 12/11/21 0421  HGB  --   --  11.7*  HCT  --   --  36.1*  PLT  --   --  202  LABPROT 24.7* 25.4* 29.0*  INR 2.2* 2.3* 2.7*  CREATININE 2.84* 2.35* 2.07*     Estimated Creatinine Clearance: 44.1 mL/min (A) (by C-G formula based on SCr of 2.07 mg/dL (H)).  Medications:  Coumadin 5mg  tab daily except 7.5mg  tab on Thursdays and Saturdays per coumadin clinic note  Assessment: Patient on Coumadin for PE. INR subtherapeutic on admission. Admitted for sepsis due to cellulitis. Started on broad spectrum antibiotics which can increase INR. Acute kidney injury noted, CrCl > 30 mL/min. Hb and Plt WNL.   Today, 12/11/2021: INR remains therapeutic at 2.7 but significantly up-trending No major DDI with warfarin noted, although recent broad-spectrum abx can increase warfarin sensitivity CBC stable Sporadic charting of meals but 100 percent breakfast and 0 percent lunch yesterday  Goal of Therapy:  INR 2-3  Plan:  Coumadin 4 mg PO x 1 today Daily INR Monitor CBC & renal function  Ulice Dash D  12/11/2021,8:00 AM

## 2021-12-11 NOTE — Consult Note (Addendum)
[facility] Nurse Consult Note: Reason for Consult: Consult requested for right leg.  Pt was admitted with cellulitis and this has evolved into blistering and partial thickness skin loss to posterior leg.  Anterior leg with generalized edema and erythremia. Patchy area of dry brown scabs to right anterior calf,approx 2X2cm. Posterior leg, approx 6X8cm, is yellow loose blistered skin, mod amt yellow drainage, beginning to loosen and peel in patchy areas and reveal pink moist partial thickness wounds. Dressing procedure/placement/frequency: Topical treatment orders provided for bedside nurses to perform as follows to promote drying and healing: Apply xeroform gauze, then ABD pads and kerlex to right posterior leg Q day. Moisten with NS to remove previous dressings Q day. Please re-consult if further assistance is needed.  Thank-you,  Julien Girt MSN, [place], [place], [place], [place]

## 2021-12-11 NOTE — Progress Notes (Signed)
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  Patient reports he woke up,  started noticing that his right leg is painful to touch and turning red.  He denies any trauma or injury to the leg. He went to his PCPs office, his blood pressure was found to be low despite not taking her blood pressure medications.  His PCP was concerned about sepsis  and was sent in the ED.  Patient is being admitted for sepsis sec. to RLE cellulitis and started on IV antibiotics. Patient is on Coumadin for DVT.  Bilateral venous duplex completed, showed age-indeterminate DVT in the left leg. Right leg is erythematous, swollen and tender.    Assessment & Plan:   Principal Problem:   Sepsis due to cellulitis [facility]) Active Problems:   History of pulmonary embolism   Essential hypertension   AKI (acute kidney injury) ([place])   Type 2 diabetes mellitus with diabetic polyneuropathy, with long-term current use of insulin (HCC)   Chronic kidney disease, stage 3b (HCC)   Cellulitis of right lower extremity  Severe Sepsis secondary to cellulitis: Patient met sepsis criteria at the time of admission. He presented with tachycardia, leukocytosis, lactic acid 2.4,  RLE cellulitis. He was initially hypotensive with SBP in the 90s which improved with IV fluid bolus. Continued Rocephin and vancomycin. Lactic acid normalized with IV hydration. Procalcitonin 9.86 , blood cultures. MRSA nares: Negative.  UA unremarkable. No evidence of necrotizing fasciitis or drainable abscess or surgical indication on CT scan at this time. Continue Ancef, vancomycin discontinued. Blood cultures no growth to date Sepsis physiology resolved.  RLE  cellulitis: Patient is on chronic anticoagulation for his prior DVT. CT lower extremity suggest cellulitis. Continue IV Ancef. Right leg cellulitis is improving,  still appears erythematous. Infectious disease consulted to guide antibiotic regimen.   Type 2 diabetes with neuropathy: Hold Trulicity. Continue NPH 20 units twice daily Continue regular insulin sliding scale.  Acute kidney injury on CKD stage IIIb: Likely prerenal from sepsis. Continue IV hydration. Renal function improving 2.35> 3.15 > 2.99>2.84>2.35>2.10 Avoid nephrotoxic medications.  Essential hypertension: BP controlled off meds. Will resume if BP not controlled.  History of pulmonary embolism: Continue home Coumadin. Venous duplex shows no evidence of DVT in the right leg Venous duplex shows evidence of age-indeterminate DVT in the left leg.   DVT prophylaxis: Coumadin Code Status: Full code. Family Communication:  No family at bed side. Disposition Plan:    Status is: Inpatient Remains inpatient appropriate because:  Admitted for sepsis sec. to right leg cellulitis requiring IV antibiotics.  Anticipated discharge home in 1 to 2 days.  Consultants:  Nephrology  Procedures: Bilateral venous duplex   Antimicrobials:   Anti-infectives (From admission, onward)    Start     Dose/Rate Route Frequency Ordered Stop   12/10/21 1400  ceFAZolin (ANCEF) IVPB 2g/100 mL premix        2 g 200 mL/hr over 30 Minutes Intravenous Every 8 hours 12/10/21 0807     12/08/21 2200  ceFAZolin (ANCEF) IVPB 2g/100 mL premix  Status:  Discontinued        2 g 200 mL/hr over 30 Minutes Intravenous Every 12 hours 12/08/21 1149 12/10/21 0807  12/08/21 0000  cefTRIAXone (ROCEPHIN) 2 g in sodium chloride 0.9 % 100 mL IVPB  Status:  Discontinued        2 g 200 mL/hr over 30 Minutes Intravenous Every 24 hours 12/07/21 0216 12/08/21 1149   12/07/21 2200  vancomycin (VANCOREADY) IVPB 1250 mg/250 mL  Status:  Discontinued         1,250 mg 166.7 mL/hr over 90 Minutes Intravenous Every 24 hours 12/07/21 0342 12/08/21 1149   12/07/21 0000  vancomycin (VANCOREADY) IVPB 2000 mg/400 mL        2,000 mg 200 mL/hr over 120 Minutes Intravenous  Once 12/06/21 2352 12/07/21 0229   12/06/21 2345  cefTRIAXone (ROCEPHIN) 2 g in sodium chloride 0.9 % 100 mL IVPB        2 g 200 mL/hr over 30 Minutes Intravenous  Once 12/06/21 2343 12/07/21 0150        Subjective: Patient was seen and examined at bedside.  Overnight events noted.   He reports feeling better but his right leg is still swollen, tender, warm and erythematous.  Objective: Vitals:   12/11/21 0200 12/11/21 0400 12/11/21 0636 12/11/21 1108  BP: 120/76 129/66 133/75 (!) 142/73  Pulse: 86 79 80 86  Resp: (!) 27 (!) _0 Temp:   98.4 F (36.9 C)   TempSrc:   Oral   SpO2: (!) 89% 94% 97% 98%  Weight:      Height:        Intake/Output Summary (Last 24 hours) at 12/11/2021 1403 Last data filed at 12/11/2021 1054 Gross per 24 hour  Intake 640 ml  Output --  Net 640 ml   Filed Weights   12/07/21 2203 12/10/21 0500  Weight: 108.9 kg 105.3 kg    Examination:  General exam: Appears comfortable, not in any acute distress. Respiratory system: Clear to auscultation bilaterally, respiratory effort normal, RR 13 Cardiovascular system: S1-S2 heard, regular rate and rhythm, no murmur. Gastrointestinal system: Abdomen is soft, non tender, non distended, BS+ Central nervous system: Alert and oriented x3 . No focal neurological deficits. Extremities: Right LE : Warm, tender, swollen, erythematous,  partial-thickness skin burns. Skin: No rashes, lesions or ulcers Psychiatry: Judgement and insight appear normal. Mood & affect appropriate.     Data Reviewed: I have personally reviewed following labs and imaging studies  CBC: Recent Labs  Lab 12/06/21 1216 12/06/21 2337 12/07/21 0235 12/08/21 0433 12/11/21 0421  WBC 17.7* 17.3* 17.4* 12.9* 10.5  NEUTROABS  16.4* 16.3*  --   --   --   HGB 13.6 12.2* 11.4* 11.0* 11.7*  HCT 41.8 38.5* 35.5* 33.3* 36.1*  MCV 91.8 96.3 94.9 92.5 91.9  PLT 199.0 152 127* 124* 412   Basic Metabolic Panel: Recent Labs  Lab 12/07/21 0235 12/08/21 0433 12/08/21 1330 12/09/21 0425 12/10/21 0336 12/11/21 0421  NA 132*  --  134* 138 138 138  K 3.9  --  3.4* 3.7 3.4* 3.8  CL 105  --  105 108 109 111  CO2 17*  --  19* 19* 18* 19*  GLUCOSE 195*  --  112* 96 91 140*  BUN 43*  --  59* 56* 50* 48*  CREATININE 2.34* 3.15* 2.99* 2.84* 2.35* 2.07*  CALCIUM 7.6*  --  7.9* 8.2* 7.7* 7.9*  MG  --   --   --  2.0  --   --   PHOS  --   --  2.6 3.1   3.0  --   --  GFR: Estimated Creatinine Clearance: 44.1 mL/min (A) (by C-G formula based on SCr of 2.07 mg/dL (H)). Liver Function Tests: Recent Labs  Lab 12/06/21 1216 12/06/21 2337 12/08/21 1330 12/09/21 0425  AST 15 21  --   --   ALT 19 21  --   --   ALKPHOS 71 50  --   --   BILITOT 1.1 0.7  --   --   PROT 7.6 6.9  --   --   ALBUMIN 4.3 3.3* 2.9* 2.7*   No results for input(s): LIPASE, AMYLASE in the last 168 hours. No results for input(s): AMMONIA in the last 168 hours. Coagulation Profile: Recent Labs  Lab 12/07/21 0434 12/08/21 0433 12/09/21 0425 12/10/21 0336 12/11/21 0421  INR 1.6* 1.9* 2.2* 2.3* 2.7*   Cardiac Enzymes: No results for input(s): CKTOTAL, CKMB, CKMBINDEX, TROPONINI in the last 168 hours. BNP (last 3 results) No results for input(s): PROBNP in the last 8760 hours. HbA1C: No results for input(s): HGBA1C in the last 72 hours.  CBG: Recent Labs  Lab 12/10/21 1119 12/10/21 1639 12/10/21 2144 12/11/21 0739 12/11/21 1105  GLUCAP 183* 83 234* 135* 174*   Lipid Profile: No results for input(s): CHOL, HDL, LDLCALC, TRIG, CHOLHDL, LDLDIRECT in the last 72 hours. Thyroid Function Tests: No results for input(s): TSH, T4TOTAL, FREET4, T3FREE, THYROIDAB in the last 72 hours. Anemia Panel: No results for input(s): VITAMINB12, FOLATE,  FERRITIN, TIBC, IRON, RETICCTPCT in the last 72 hours. Sepsis Labs: Recent Labs  Lab 12/06/21 2337 12/07/21 0142 12/07/21 0235 12/07/21 0533 12/07/21 0806  PROCALCITON  --   --  9.86  --   --   LATICACIDVEN 2.4* 3.0*  --  1.8 1.9    Recent Results (from the past 240 hour(s))  Blood Culture (routine x 2)     Status: None (Preliminary result)   Collection Time: 12/06/21 11:37 PM   Specimen: BLOOD RIGHT WRIST  Result Value Ref Range Status   Specimen Description   Final    BLOOD RIGHT WRIST Performed at [facility] [address], [place], [place] [address]    Special Requests   Final    BOTTLES DRAWN AEROBIC AND ANAEROBIC Blood Culture adequate volume Performed at [facility] [address], [place], [place] [address]    Culture   Final    NO GROWTH 4 DAYS Performed at [facility], [place] [address] [place], [place] [address]    Report Status PENDING  Incomplete  Blood Culture (routine x 2)     Status: None (Preliminary result)   Collection Time: 12/06/21 11:37 PM   Specimen: Left Antecubital; Blood  Result Value Ref Range Status   Specimen Description   Final    LEFT ANTECUBITAL Performed at [facility] [address] [place], [place] [address]    Special Requests   Final    BOTTLES DRAWN AEROBIC AND ANAEROBIC Blood Culture adequate volume Performed at [facility] [address], [place], [place] [address]    Culture   Final    NO GROWTH 4 DAYS Performed at [facility], [place] [address] [place], [place] [address]    Report Status PENDING  Incomplete  Resp Panel by RT-PCR (Flu A&B, Covid) Nasopharyngeal Swab     Status: None   Collection Time: 12/06/21 11:56 PM   Specimen: Nasopharyngeal Swab; Nasopharyngeal(NP) swabs in vial transport medium  Result Value Ref Range Status   SARS Coronavirus  2 by RT PCR NEGATIVE NEGATIVE Final    Comment: (NOTE) SARS-CoV-2 target nucleic  acids are NOT DETECTED.  The SARS-CoV-2 RNA is generally detectable in upper respiratory specimens during the acute phase of infection. The lowest concentration of SARS-CoV-2 viral copies this assay can detect is 138 copies/mL. A negative result does not preclude SARS-Cov-2 infection and should not be used as the sole basis for treatment or other patient management decisions. A negative result may occur with  improper specimen collection/handling, submission of specimen other than nasopharyngeal swab, presence of viral mutation(s) within the areas targeted by this assay, and inadequate number of viral copies(<138 copies/mL). A negative result must be combined with clinical observations, patient history, and epidemiological information. The expected result is Negative.  Fact Sheet for Patients:  EntrepreneurPulse.com.au  Fact Sheet for Healthcare Providers:  IncredibleEmployment.be  This test is no t yet approved or cleared by the [place] FDA and  has been authorized for detection and/or diagnosis of SARS-CoV-2 by FDA under an Emergency Use Authorization (EUA). This EUA will remain  in effect (meaning this test can be used) for the duration of the COVID-19 declaration under Section 564(b)(1) of the Act, 21 U.S.C.section 360bbb-3(b)(1), unless the authorization is terminated  or revoked sooner.       Influenza A by PCR NEGATIVE NEGATIVE Final   Influenza B by PCR NEGATIVE NEGATIVE Final    Comment: (NOTE) The Xpert Xpress SARS-CoV-2/FLU/RSV plus assay is intended as an aid in the diagnosis of influenza from Nasopharyngeal swab specimens and should not be used as a sole basis for treatment. Nasal washings and aspirates are unacceptable for Xpert Xpress SARS-CoV-2/FLU/RSV testing.  Fact Sheet for Patients: EntrepreneurPulse.com.au  Fact Sheet for Healthcare Providers: IncredibleEmployment.be  This  test is not yet approved or cleared by the [place] FDA and has been authorized for detection and/or diagnosis of SARS-CoV-2 by FDA under an Emergency Use Authorization (EUA). This EUA will remain in effect (meaning this test can be used) for the duration of the COVID-19 declaration under Section 564(b)(1) of the Act, 21 U.S.C. section 360bbb-3(b)(1), unless the authorization is terminated or revoked.  Performed at [facility], [place] [address], [place], [place] [address]   Urine Culture     Status: None   Collection Time: 12/07/21  2:37 AM   Specimen: In/Out Cath Urine  Result Value Ref Range Status   Specimen Description   Final    IN/OUT CATH URINE Performed at [facility] [address], [place], [place] [address]    Special Requests   Final    NONE Performed at [facility], [place] [address], [place], [place] [address]    Culture   Final    NO GROWTH Performed at [facility], [place] [address], [place], [place] [address]    Report Status 12/08/2021 FINAL  Final  MRSA Next Gen by PCR, Nasal     Status: None   Collection Time: 12/07/21  2:51 AM   Specimen: Nasal Mucosa; Nasal Swab  Result Value Ref Range Status   MRSA by PCR Next Gen NOT DETECTED NOT DETECTED Final    Comment: (NOTE) The GeneXpert MRSA Assay (FDA approved for NASAL specimens only), is one component of a comprehensive MRSA colonization surveillance program. It is not intended to diagnose MRSA infection nor to guide or monitor treatment for MRSA infections. Test performance is not FDA approved in patients less than 65 years old. Performed at [facility]  [place] [address], [place], [place] [address]     Radiology Studies: No results found.  Scheduled Meds:  insulin aspart  0-15 Units Subcutaneous TID WC   insulin aspart  4 Units Subcutaneous TID WC   insulin NPH Human  20 Units Subcutaneous BID AC & HS   warfarin  4 mg  Oral ONCE-1600   Warfarin - Pharmacist Dosing Inpatient   Does not apply q1600   Continuous Infusions:   ceFAZolin (ANCEF) IV 2 g (12/11/21 0533)     LOS: 4 days    Time spent: 35 mins    Gizzelle Lacomb, MD [facility]   If 7PM-7AM, please contact night-coverage

## 2021-12-11 NOTE — Consult Note (Signed)
[facility] for Infectious Disease    Date of Admission:  12/06/2021   Total days of inpatient antibiotics 4        Reason for Consult: RLE cellulitis    Principal Problem:   Sepsis due to cellulitis [facility]) Active Problems:   History of pulmonary embolism   Essential hypertension   AKI (acute kidney injury) ([place])   Type 2 diabetes mellitus with diabetic polyneuropathy, with long-term current use of insulin (HCC)   Chronic kidney disease, stage 3b (HCC)   Cellulitis of right lower extremity   Assessment: 66 YM with LLE DVT/PE on warfarin, CKD stage III admitted for RLE cellulitis. RLE erythema persists beside 5 days of IV antibiotics although leukocytosis and fever resolved.   #RLE cellulitis -B//L LE [place] did not show signs of RLE dvt(known LLE dvt) -CT showed cellulitis, no abscess -Given the pattern of erythema(slightly patchy) I suspect infecton is 2/2 group A strep as such will add linezolid x48 for antitoxin effect.  Recommendations:  -Continue cefazolin -ADD linezolid 600mg  PO bid x 48h -Keep legs elevated -Monitor clinically  Microbiology:   Antibiotics: Vancomycin and ceftriaxone 2/2-2/3 Cefazolin 2/4-p  Cultures: Blood 2/2 NG Urine 2/2 NG    HPI: [name] is a 66 y.o. male with CKD stage III, LLE DVT/PE on warfarin, DM, HTN, HLD admitted for RLE  cellulitis. Pt reported fever/tremor day prior to admission. He stated his right leg was painful to touch. Seen by PCP and sent to ED for further management. On arrival, pt was hypotensive, wbc 17k. He was started on vancomycin and rocephin. Now on cefazolin. ID engaged as RLE erythema persistent following 5 days of antibiotics. Today, pt is sitting in chair. He reports RLE edema has improved significantly. RLE erythema is slightly improved. Denies any recent trauma to his extremities. Pt is not around cats/dogs.    Review of Systems: ROS  Past Medical History:  Diagnosis Date   Arthritis     Chronic kidney disease    reduced kidney function. Stage III   Diabetes mellitus without complication ([place])    DVT (deep venous thrombosis) [facility])    age indeterminate LLE DVT 02/09/20   Dyspnea    On exertion   History of kidney stones    History of pulmonary embolus (PE)    Hypertension    Lipidemia    Prostate enlargement    Pulmonary embolism ([place]) 2009   Sleep apnea     Social History   Tobacco Use   Smoking status: Never   Smokeless tobacco: Never  Vaping Use   Vaping Use: Never used  Substance Use Topics   Alcohol use: Yes    Alcohol/week: 1.0 standard drink    Types: 1 Cans of beer per week    Comment: social   Drug use: Never    Family History  Problem Relation Age of Onset   Cancer Mother    Heart disease Father    Scheduled Meds:  insulin aspart  0-15 Units Subcutaneous TID WC   insulin aspart  4 Units Subcutaneous TID WC   insulin NPH Human  20 Units Subcutaneous BID AC & HS   linezolid  600 mg Oral Q12H   warfarin  4 mg Oral ONCE-1600   Warfarin - Pharmacist Dosing Inpatient   Does not apply q1600   Continuous Infusions:   ceFAZolin (ANCEF) IV 2 g (12/11/21 0533)   PRN Meds:.acetaminophen **OR** acetaminophen, ondansetron **OR** ondansetron (ZOFRAN)  IV No Known Allergies  OBJECTIVE: Blood pressure (!) 142/73, pulse 86, temperature 98.4 F (36.9 C), temperature source Oral, resp. rate 20, height 6\' 1"  (1.854 m), weight 105.3 kg, SpO2 98 %.  Physical Exam Constitutional:      General: He is not in acute distress.    Appearance: He is normal weight. He is not toxic-appearing.  HENT:     Head: Normocephalic and atraumatic.     Right Ear: External ear normal.     Left Ear: External ear normal.     Nose: No congestion or rhinorrhea.     Mouth/Throat:     Mouth: Mucous membranes are moist.     Pharynx: Oropharynx is clear.  Eyes:     Extraocular Movements: Extraocular movements intact.     Conjunctiva/sclera: Conjunctivae normal.     Pupils:  Pupils are equal, round, and reactive to light.  Cardiovascular:     Rate and Rhythm: Normal rate and regular rhythm.     Heart sounds: No murmur heard.   No friction rub. No gallop.  Pulmonary:     Effort: Pulmonary effort is normal.     Breath sounds: Normal breath sounds.  Abdominal:     General: Abdomen is flat. Bowel sounds are normal.     Palpations: Abdomen is soft.  Musculoskeletal:        General: No swelling. Normal range of motion.     Cervical back: Normal range of motion and neck supple.  Skin:    General: Skin is warm and dry.     Comments: RLE erythema extending to posterior inner thigh.   Neurological:     General: No focal deficit present.     Mental Status: He is oriented to person, place, and time.  Psychiatric:        Mood and Affect: Mood normal.    Lab Results Lab Results  Component Value Date   WBC 10.5 12/11/2021   HGB 11.7 (L) 12/11/2021   HCT 36.1 (L) 12/11/2021   MCV 91.9 12/11/2021   PLT 202 12/11/2021    Lab Results  Component Value Date   CREATININE 2.07 (H) 12/11/2021   BUN 48 (H) 12/11/2021   NA 138 12/11/2021   K 3.8 12/11/2021   CL 111 12/11/2021   CO2 19 (L) 12/11/2021    Lab Results  Component Value Date   ALT 21 12/06/2021   AST 21 12/06/2021   ALKPHOS 50 12/06/2021   BILITOT 0.7 12/06/2021       Laurice Record, [facility] for Infectious Disease [facility] 12/11/2021, 2:24 PM

## 2021-12-12 LAB — BASIC METABOLIC PANEL
Anion gap: 9 (ref 5–15)
BUN: 49 mg/dL — ABNORMAL HIGH (ref 8–23)
CO2: 18 mmol/L — ABNORMAL LOW (ref 22–32)
Calcium: 7.8 mg/dL — ABNORMAL LOW (ref 8.9–10.3)
Chloride: 109 mmol/L (ref 98–111)
Creatinine, Ser: 1.99 mg/dL — ABNORMAL HIGH (ref 0.61–1.24)
GFR, Estimated: 36 mL/min — ABNORMAL LOW (ref >60)
Glucose, Bld: 163 mg/dL — ABNORMAL HIGH (ref 70–99)
Potassium: 3.9 mmol/L (ref 3.5–5.1)
Sodium: 136 mmol/L (ref 135–145)

## 2021-12-12 LAB — CULTURE, BLOOD (ROUTINE X 2)
Culture: NO GROWTH
Culture: NO GROWTH
Special Requests: ADEQUATE
Special Requests: ADEQUATE

## 2021-12-12 LAB — GLUCOSE, CAPILLARY
Glucose-Capillary: 117 mg/dL — ABNORMAL HIGH (ref 70–99)
Glucose-Capillary: 141 mg/dL — ABNORMAL HIGH (ref 70–99)
Glucose-Capillary: 143 mg/dL — ABNORMAL HIGH (ref 70–99)
Glucose-Capillary: 190 mg/dL — ABNORMAL HIGH (ref 70–99)
Glucose-Capillary: 212 mg/dL — ABNORMAL HIGH (ref 70–99)

## 2021-12-12 LAB — PROTIME-INR
INR: 2.8 — ABNORMAL HIGH (ref 0.8–1.2)
Prothrombin Time: 29.4 seconds — ABNORMAL HIGH (ref 11.4–15.2)

## 2021-12-12 LAB — CBC
HCT: 35.2 % — ABNORMAL LOW (ref 39.0–52.0)
Hemoglobin: 11.7 g/dL — ABNORMAL LOW (ref 13.0–17.0)
MCH: 30.1 pg (ref 26.0–34.0)
MCHC: 33.2 g/dL (ref 30.0–36.0)
MCV: 90.5 fL (ref 80.0–100.0)
Platelets: 246 10*3/uL (ref 150–400)
RBC: 3.89 MIL/uL — ABNORMAL LOW (ref 4.22–5.81)
RDW: 14.6 % (ref 11.5–15.5)
WBC: 9.3 10*3/uL (ref 4.0–10.5)
nRBC: 0 % (ref 0.0–0.2)

## 2021-12-12 MED ORDER — CEFADROXIL 500 MG PO CAPS
500.0000 mg | ORAL_CAPSULE | Freq: Two times a day (BID) | ORAL | Status: DC
  Administered 2021-12-13 (×2): 500 mg via ORAL
  Filled 2021-12-12 (×2): qty 1

## 2021-12-12 MED ORDER — WARFARIN SODIUM 4 MG PO TABS
4.0000 mg | ORAL_TABLET | Freq: Once | ORAL | Status: AC
  Administered 2021-12-12: 4 mg via ORAL
  Filled 2021-12-12: qty 1

## 2021-12-12 NOTE — Progress Notes (Signed)
ANTICOAGULATION CONSULT NOTE - Follow Up Consult  Pharmacy Consult for warfarin Indication: hx pulmonary embolus  No Known Allergies  Patient Measurements: Height: 6\' 1"  (185.4 cm) Weight: 105.3 kg (232 lb 2.3 oz) IBW/kg (Calculated) : 79.9 Heparin Dosing Weight:   Vital Signs: Temp: 98 F (36.7 C) (02/08 0635) Temp Source: Axillary (02/08 0635) BP: 131/79 (02/08 0635)  Labs: Recent Labs    12/10/21 0336 12/11/21 0421 12/12/21 0354  HGB  --  11.7* 11.7*  HCT  --  36.1* 35.2*  PLT  --  202 246  LABPROT 25.4* 29.0* 29.4*  INR 2.3* 2.7* 2.8*  CREATININE 2.35* 2.07* 1.99*    Estimated Creatinine Clearance: 45.9 mL/min (A) (by C-G formula based on SCr of 1.99 mg/dL (H)).   Medications:  - PTA warfarin regimen: 5 mg daily except 7.5 mg on Thu and Sat  Assessment: Patient is a 66 y.o M with hx PE/DVT on warfarin PTA, presented to the ED on 12/06/21 for worsening of LE cellulitis.  Warfarin resumed on admission.  Today, 12/12/2021: - INR is therapeutic at 2.8 but trending up - cbc stable - no bleeding documented - drug-drug intxns: being on abx can make pt more sensitive to warfarin (currently in ancef and zyvox) - scr elevated but trending down   Goal of Therapy:  INR 2-3 Monitor platelets by anticoagulation protocol: Yes   Plan:  - repeat warfarin 4mg  PO x1 today - daily INR - monitor for s/sx bleeding  Lynelle Doctor 12/12/2021,12:06 PM

## 2021-12-12 NOTE — Progress Notes (Signed)
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  Patient reports he woke up,  started noticing that his right leg is painful to touch and turning red.  He denies any trauma or injury to the leg. He went to his PCPs office, his blood pressure was found to be low despite not taking her blood pressure medications.  His PCP was concerned about sepsis  and was sent in the ED.  Patient is being admitted for sepsis sec. to RLE cellulitis and started on IV antibiotics. Patient is on Coumadin for DVT. Bilateral venous duplex completed, showed age-indeterminate DVT in the left leg. Right leg is erythematous, swollen and tender.    Assessment & Plan:   Principal Problem:   Sepsis due to cellulitis [facility]) Active Problems:   History of pulmonary embolism   Essential hypertension   AKI (acute kidney injury) ([place])   Type 2 diabetes mellitus with diabetic polyneuropathy, with long-term current use of insulin (HCC)   Chronic kidney disease, stage 3b (HCC)   Cellulitis of right lower extremity  Severe Sepsis secondary to cellulitis: Patient met sepsis criteria at the time of admission. He presented with tachycardia, leukocytosis, lactic acid 2.4,  RLE cellulitis. He was initially hypotensive with SBP in the 90s which improved with IV fluid bolus. Continued Rocephin and vancomycin. Lactic acid normalized with IV hydration. Procalcitonin 9.86 , blood cultures. MRSA nares: Negative.  UA unremarkable. No evidence of necrotizing fasciitis or drainable abscess on CT scan at this time. Continue Ancef, vancomycin discontinued. Blood cultures: no growth to date Sepsis physiology resolved.  RLE cellulitis: Patient is on chronic  anticoagulation for his prior DVT. CT lower extremity suggest cellulitis. Continue IV Ancef. ID recommended linezolid for 48 hours in addition to Ancef. RLE cellulitis is improving. Wound care consult.  Type 2 diabetes with neuropathy: Hold Trulicity. Continue NPH 20 units twice daily Continue regular insulin sliding scale.  Acute kidney injury on CKD stage IIIb: Likely prerenal from sepsis. Continue IV hydration. Renal function improving 2.35> 3.15 > 2.99>2.84>2.35>2.10>1.9 Avoid nephrotoxic medications.  Essential hypertension: BP controlled off meds. Will resume if BP not controlled.  History of pulmonary embolism: Continue home Coumadin. Venous duplex shows no evidence of DVT in the right leg Venous duplex shows evidence of age-indeterminate DVT in the left leg.   DVT prophylaxis: Coumadin Code Status: Full code. Family Communication:  No family at bed side. Disposition Plan:    Status is: Inpatient Remains inpatient appropriate because:  Admitted for sepsis sec. to right leg cellulitis requiring IV antibiotics.  Anticipated discharge home in 1 to 2 days.  Consultants:  Nephrology  Procedures: Bilateral venous duplex   Antimicrobials:   Anti-infectives (From admission, onward)    Start     Dose/Rate Route Frequency Ordered Stop   12/13/21 1000  cefadroxil (DURICEF) capsule 500 mg        500 mg Oral 2 times daily 12/12/21 1311 12/20/21 0959   12/11/21 1515  linezolid (ZYVOX) tablet 600 mg        600 mg Oral Every 12 hours 12/11/21 1423 12/13/21 0959   12/10/21 1400  ceFAZolin (ANCEF) IVPB 2g/100 mL premix        2 g  200 mL/hr over 30 Minutes Intravenous Every 8 hours 12/10/21 0807 12/12/21 2359   12/08/21 2200  ceFAZolin (ANCEF) IVPB 2g/100 mL premix  Status:  Discontinued        2 g 200 mL/hr over 30 Minutes Intravenous Every 12 hours 12/08/21 1149 12/10/21 0807   12/08/21 0000  cefTRIAXone (ROCEPHIN) 2 g in sodium chloride 0.9 % 100 mL IVPB  Status:   Discontinued        2 g 200 mL/hr over 30 Minutes Intravenous Every 24 hours 12/07/21 0216 12/08/21 1149   12/07/21 2200  vancomycin (VANCOREADY) IVPB 1250 mg/250 mL  Status:  Discontinued        1,250 mg 166.7 mL/hr over 90 Minutes Intravenous Every 24 hours 12/07/21 0342 12/08/21 1149   12/07/21 0000  vancomycin (VANCOREADY) IVPB 2000 mg/400 mL        2,000 mg 200 mL/hr over 120 Minutes Intravenous  Once 12/06/21 2352 12/07/21 0229   12/06/21 2345  cefTRIAXone (ROCEPHIN) 2 g in sodium chloride 0.9 % 100 mL IVPB        2 g 200 mL/hr over 30 Minutes Intravenous  Once 12/06/21 2343 12/07/21 0150        Subjective: Patient was seen and examined at bedside.  Overnight events noted.   His right leg is still swollen, tender,  warm and erythematous.   Objective: Vitals:   12/11/21 2045 12/11/21 2200 12/12/21 0000 12/12/21 0635  BP:  (!) 149/78 129/75 131/79  Pulse:  93 80   Resp:  (!) 23 (!) 21 20  Temp: 98.4 F (36.9 C)   98 F (36.7 C)  TempSrc: Axillary   Axillary  SpO2:  98% 96% 98%  Weight:      Height:        Intake/Output Summary (Last 24 hours) at 12/12/2021 1352 Last data filed at 12/12/2021 1300 Gross per 24 hour  Intake 1360 ml  Output 1250 ml  Net 110 ml   Filed Weights   12/07/21 2203 12/10/21 0500  Weight: 108.9 kg 105.3 kg    Examination:  General exam: Appears comfortable, not in any acute distress. Respiratory system: CTA bilaterally, respiratory effort normal, RR 13 Cardiovascular system: S1-S2 heard, regular rate and rhythm, no murmur. Gastrointestinal system: Abdomen is soft, non tender, non distended, BS+ Central nervous system: Alert and oriented x3 . No focal neurological deficits. Extremities: Right LE : Warm, tender, swollen, erythematous,  partial-thickness skin burns. Skin: No rashes, lesions or ulcers Psychiatry: Judgement and insight appear normal. Mood & affect appropriate.     Data Reviewed: I have personally reviewed following labs  and imaging studies  CBC: Recent Labs  Lab 12/06/21 1216 12/06/21 2337 12/07/21 0235 12/08/21 0433 12/11/21 0421 12/12/21 0354  WBC 17.7* 17.3* 17.4* 12.9* 10.5 9.3  NEUTROABS 16.4* 16.3*  --   --   --   --   HGB 13.6 12.2* 11.4* 11.0* 11.7* 11.7*  HCT 41.8 38.5* 35.5* 33.3* 36.1* 35.2*  MCV 91.8 96.3 94.9 92.5 91.9 90.5  PLT 199.0 152 127* 124* 202 007   Basic Metabolic Panel: Recent Labs  Lab 12/08/21 1330 12/09/21 0425 12/10/21 0336 12/11/21 0421 12/12/21 0354  NA 134* 138 138 138 136  K 3.4* 3.7 3.4* 3.8 3.9  CL 105 108 109 111 109  CO2 19* 19* 18* 19* 18*  GLUCOSE 112* 96 91 140* 163*  BUN 59* 56* 50* 48* 49*  CREATININE 2.99* 2.84* 2.35* 2.07* 1.99*  CALCIUM 7.9* 8.2* 7.7* 7.9*  7.8*  MG  --  2.0  --   --   --   PHOS 2.6 3.1   3.0  --   --   --    GFR: Estimated Creatinine Clearance: 45.9 mL/min (A) (by C-G formula based on SCr of 1.99 mg/dL (H)). Liver Function Tests: Recent Labs  Lab 12/06/21 1216 12/06/21 2337 12/08/21 1330 12/09/21 0425  AST 15 21  --   --   ALT 19 21  --   --   ALKPHOS 71 50  --   --   BILITOT 1.1 0.7  --   --   PROT 7.6 6.9  --   --   ALBUMIN 4.3 3.3* 2.9* 2.7*   No results for input(s): LIPASE, AMYLASE in the last 168 hours. No results for input(s): AMMONIA in the last 168 hours. Coagulation Profile: Recent Labs  Lab 12/08/21 0433 12/09/21 0425 12/10/21 0336 12/11/21 0421 12/12/21 0354  INR 1.9* 2.2* 2.3* 2.7* 2.8*   Cardiac Enzymes: No results for input(s): CKTOTAL, CKMB, CKMBINDEX, TROPONINI in the last 168 hours. BNP (last 3 results) No results for input(s): PROBNP in the last 8760 hours. HbA1C: No results for input(s): HGBA1C in the last 72 hours.  CBG: Recent Labs  Lab 12/11/21 1642 12/11/21 2044 12/12/21 0003 12/12/21 0740 12/12/21 1132  GLUCAP 112* 146* 212* 143* 141*   Lipid Profile: No results for input(s): CHOL, HDL, LDLCALC, TRIG, CHOLHDL, LDLDIRECT in the last 72 hours. Thyroid Function  Tests: No results for input(s): TSH, T4TOTAL, FREET4, T3FREE, THYROIDAB in the last 72 hours. Anemia Panel: No results for input(s): VITAMINB12, FOLATE, FERRITIN, TIBC, IRON, RETICCTPCT in the last 72 hours. Sepsis Labs: Recent Labs  Lab 12/06/21 2337 12/07/21 0142 12/07/21 0235 12/07/21 0533 12/07/21 0806  PROCALCITON  --   --  9.86  --   --   LATICACIDVEN 2.4* 3.0*  --  1.8 1.9    Recent Results (from the past 240 hour(s))  Blood Culture (routine x 2)     Status: None   Collection Time: 12/06/21 11:37 PM   Specimen: BLOOD RIGHT WRIST  Result Value Ref Range Status   Specimen Description   Final    BLOOD RIGHT WRIST Performed at [facility] [address] [place], [place] [address]    Special Requests   Final    BOTTLES DRAWN AEROBIC AND ANAEROBIC Blood Culture adequate volume Performed at [facility] [address], [place], [place] [address]    Culture   Final    NO GROWTH 5 DAYS Performed at [facility], [place] [address], [place], [place] [address]    Report Status 12/12/2021 FINAL  Final  Blood Culture (routine x 2)     Status: None   Collection Time: 12/06/21 11:37 PM   Specimen: Left Antecubital; Blood  Result Value Ref Range Status   Specimen Description   Final    LEFT ANTECUBITAL Performed at [facility] [address] [place], [place] [address]    Special Requests   Final    BOTTLES DRAWN AEROBIC AND ANAEROBIC Blood Culture adequate volume Performed at [facility] [address], [place], [place] [address]    Culture   Final    NO GROWTH 5 DAYS Performed at [facility], [place] [address], [place],  [address]    Report Status 12/12/2021 FINAL  Final  Resp Panel by RT-PCR (Flu A&B, Covid) Nasopharyngeal Swab  Status: None   Collection Time: 12/06/21 11:56 PM   Specimen: Nasopharyngeal Swab; Nasopharyngeal(NP) swabs in vial transport medium  Result  Value Ref Range Status   SARS Coronavirus 2 by RT PCR NEGATIVE NEGATIVE Final    Comment: (NOTE) SARS-CoV-2 target nucleic acids are NOT DETECTED.  The SARS-CoV-2 RNA is generally detectable in upper respiratory specimens during the acute phase of infection. The lowest concentration of SARS-CoV-2 viral copies this assay can detect is 138 copies/mL. A negative result does not preclude SARS-Cov-2 infection and should not be used as the sole basis for treatment or other patient management decisions. A negative result may occur with  improper specimen collection/handling, submission of specimen other than nasopharyngeal swab, presence of viral mutation(s) within the areas targeted by this assay, and inadequate number of viral copies(<138 copies/mL). A negative result must be combined with clinical observations, patient history, and epidemiological information. The expected result is Negative.  Fact Sheet for Patients:  EntrepreneurPulse.com.au  Fact Sheet for Healthcare Providers:  IncredibleEmployment.be  This test is no t yet approved or cleared by the [place] FDA and  has been authorized for detection and/or diagnosis of SARS-CoV-2 by FDA under an Emergency Use Authorization (EUA). This EUA will remain  in effect (meaning this test can be used) for the duration of the COVID-19 declaration under Section 564(b)(1) of the Act, 21 U.S.C.section 360bbb-3(b)(1), unless the authorization is terminated  or revoked sooner.       Influenza A by PCR NEGATIVE NEGATIVE Final   Influenza B by PCR NEGATIVE NEGATIVE Final    Comment: (NOTE) The Xpert Xpress SARS-CoV-2/FLU/RSV plus assay is intended as an aid in the diagnosis of influenza from Nasopharyngeal swab specimens and should not be used as a sole basis for treatment. Nasal washings and aspirates are unacceptable for Xpert Xpress SARS-CoV-2/FLU/RSV testing.  Fact Sheet for  Patients: EntrepreneurPulse.com.au  Fact Sheet for Healthcare Providers: IncredibleEmployment.be  This test is not yet approved or cleared by the [place] FDA and has been authorized for detection and/or diagnosis of SARS-CoV-2 by FDA under an Emergency Use Authorization (EUA). This EUA will remain in effect (meaning this test can be used) for the duration of the COVID-19 declaration under Section 564(b)(1) of the Act, 21 U.S.C. section 360bbb-3(b)(1), unless the authorization is terminated or revoked.  Performed at [facility], [place] [address], [place], [place] [address]   Urine Culture     Status: None   Collection Time: 12/07/21  2:37 AM   Specimen: In/Out Cath Urine  Result Value Ref Range Status   Specimen Description   Final    IN/OUT CATH URINE Performed at [facility] [address] [place], [place] [address]    Special Requests   Final    NONE Performed at [facility], [place] [address] [place], [place] [address]    Culture   Final    NO GROWTH Performed at [facility], [place] [address], [place], [place] [address]    Report Status 12/08/2021 FINAL  Final  MRSA Next Gen by PCR, Nasal     Status: None   Collection Time: 12/07/21  2:51 AM   Specimen: Nasal Mucosa; Nasal Swab  Result Value Ref Range Status   MRSA by PCR Next Gen NOT DETECTED NOT DETECTED Final    Comment: (NOTE) The GeneXpert MRSA Assay (FDA approved for NASAL specimens only), is one component of a comprehensive MRSA colonization surveillance program. It is not  intended to diagnose MRSA infection nor to guide or monitor treatment for MRSA infections. Test performance is not FDA approved in patients less than 55 years old. Performed at [facility], [place] [address], [place], [place] [address]     Radiology Studies: No results found.  Scheduled Meds:  [START ON 12/13/2021]  cefadroxil  500 mg Oral BID   insulin aspart  0-15 Units Subcutaneous TID WC   insulin aspart  4 Units Subcutaneous TID WC   insulin NPH Human  20 Units Subcutaneous BID AC & HS   linezolid  600 mg Oral Q12H   warfarin  4 mg Oral ONCE-1600   Warfarin - Pharmacist Dosing Inpatient   Does not apply q1600   Continuous Infusions:   ceFAZolin (ANCEF) IV 2 g (12/12/21 0542)     LOS: 5 days    Time spent: 35 mins    Darin Redmann, MD [facility]   If 7PM-7AM, please contact night-coverage

## 2021-12-12 NOTE — Progress Notes (Signed)
[place] for Infectious Disease  Date of Admission:  12/06/2021   Total days of inpatient antibiotics 6  Principal Problem:   Sepsis due to cellulitis [facility]) Active Problems:   History of pulmonary embolism   Essential hypertension   AKI (acute kidney injury) ([place])   Type 2 diabetes mellitus with diabetic polyneuropathy, with long-term current use of insulin (HCC)   Chronic kidney disease, stage 3b (HCC)   Cellulitis of right lower extremity          Assessment: 1 YM with LLE DVT/PE on warfarin, CKD stage III admitted for RLE cellulitis. RLE erythema persists beside 5 days of IV antibiotics although leukocytosis and fever resolved.   #RLE cellulitis -B//L LE [place] did not show signs of RLE dvt(known LLE dvt) -CT showed cellulitis, no abscess -Given the pattern of erythema(slightly patchy) I suspect infecton is 2/2 group A strep as such added linezolid x48 for antitoxin effect.  -RLE with improved erythema this AM. Recommendations:  -D/C cefazolin on 2/9 and start cefadroxil to complete 14 days of antibiotics for complicated skin soft tissue infection(EOT 2/14) -Continue linezolid 600mg  PO bid x 48h(last dose today) -Keep legs elevated -Monitor clinically -ID will sign off  Microbiology:   Antibiotics: Vancomycin and ceftriaxone 2/2-2/3 Cefazolin 2/4-p   Cultures: Blood 2/2 NG Urine 2/2 NG    SUBJECTIVE: No new complaints.   Interval: Afebrile overnight  Review of Systems: Review of Systems  All other systems reviewed and are negative.   Scheduled Meds:  [START ON 12/13/2021] cefadroxil  500 mg Oral BID   insulin aspart  0-15 Units Subcutaneous TID WC   insulin aspart  4 Units Subcutaneous TID WC   insulin NPH Human  20 Units Subcutaneous BID AC & HS   linezolid  600 mg Oral Q12H   warfarin  4 mg Oral ONCE-1600   Warfarin - Pharmacist Dosing Inpatient   Does not apply q1600   Continuous Infusions:   ceFAZolin (ANCEF) IV 2 g (12/12/21 1402)    PRN Meds:.acetaminophen **OR** acetaminophen, ondansetron **OR** ondansetron (ZOFRAN) IV No Known Allergies  OBJECTIVE: Vitals:   12/11/21 2200 12/12/21 0000 12/12/21 0635 12/12/21 1514  BP: (!) 149/78 129/75 131/79 132/86  Pulse: 93 80  79  Resp: (!) 23 (!) 21 20 (!) 21  Temp:   98 F (36.7 C) 98.3 F (36.8 C)  TempSrc:   Axillary Axillary  SpO2: 98% 96% 98% 97%  Weight:      Height:       Body mass index is 30.63 kg/m.  Physical Exam Constitutional:      General: He is not in acute distress.    Appearance: He is normal weight. He is not toxic-appearing.  HENT:     Head: Normocephalic and atraumatic.     Right Ear: External ear normal.     Left Ear: External ear normal.     Nose: No congestion or rhinorrhea.     Mouth/Throat:     Mouth: Mucous membranes are moist.     Pharynx: Oropharynx is clear.  Eyes:     Extraocular Movements: Extraocular movements intact.     Conjunctiva/sclera: Conjunctivae normal.     Pupils: Pupils are equal, round, and reactive to light.  Cardiovascular:     Rate and Rhythm: Normal rate and regular rhythm.     Heart sounds: No murmur heard.   No friction rub. No gallop.  Pulmonary:     Effort: Pulmonary  effort is normal.     Breath sounds: Normal breath sounds.  Abdominal:     General: Abdomen is flat. Bowel sounds are normal.     Palpations: Abdomen is soft.  Musculoskeletal:        General: No swelling. Normal range of motion.     Cervical back: Normal range of motion and neck supple.  Skin:    General: Skin is warm and dry.     Comments: Improved erythema of RLE  Neurological:     General: No focal deficit present.     Mental Status: He is oriented to person, place, and time.  Psychiatric:        Mood and Affect: Mood normal.      Lab Results Lab Results  Component Value Date   WBC 9.3 12/12/2021   HGB 11.7 (L) 12/12/2021   HCT 35.2 (L) 12/12/2021   MCV 90.5 12/12/2021   PLT 246 12/12/2021    Lab Results   Component Value Date   CREATININE 1.99 (H) 12/12/2021   BUN 49 (H) 12/12/2021   NA 136 12/12/2021   K 3.9 12/12/2021   CL 109 12/12/2021   CO2 18 (L) 12/12/2021    Lab Results  Component Value Date   ALT 21 12/06/2021   AST 21 12/06/2021   ALKPHOS 50 12/06/2021   BILITOT 0.7 12/06/2021        Laurice Record, [place] [facility]ious [facility] 12/12/2021, 4:09 PM

## 2021-12-13 ENCOUNTER — Ambulatory Visit: Payer: Medicare Other | Admitting: Family Medicine

## 2021-12-13 LAB — BASIC METABOLIC PANEL
Anion gap: 7 (ref 5–15)
BUN: 47 mg/dL — ABNORMAL HIGH (ref 8–23)
CO2: 18 mmol/L — ABNORMAL LOW (ref 22–32)
Calcium: 7.7 mg/dL — ABNORMAL LOW (ref 8.9–10.3)
Chloride: 111 mmol/L (ref 98–111)
Creatinine, Ser: 1.95 mg/dL — ABNORMAL HIGH (ref 0.61–1.24)
GFR, Estimated: 37 mL/min — ABNORMAL LOW (ref >60)
Glucose, Bld: 168 mg/dL — ABNORMAL HIGH (ref 70–99)
Potassium: 4.1 mmol/L (ref 3.5–5.1)
Sodium: 136 mmol/L (ref 135–145)

## 2021-12-13 LAB — CBC
HCT: 35.8 % — ABNORMAL LOW (ref 39.0–52.0)
Hemoglobin: 11.8 g/dL — ABNORMAL LOW (ref 13.0–17.0)
MCH: 29.9 pg (ref 26.0–34.0)
MCHC: 33 g/dL (ref 30.0–36.0)
MCV: 90.9 fL (ref 80.0–100.0)
Platelets: 247 10*3/uL (ref 150–400)
RBC: 3.94 MIL/uL — ABNORMAL LOW (ref 4.22–5.81)
RDW: 14.6 % (ref 11.5–15.5)
WBC: 9.6 10*3/uL (ref 4.0–10.5)
nRBC: 0 % (ref 0.0–0.2)

## 2021-12-13 LAB — PROTIME-INR
INR: 2.7 — ABNORMAL HIGH (ref 0.8–1.2)
Prothrombin Time: 28.4 seconds — ABNORMAL HIGH (ref 11.4–15.2)

## 2021-12-13 LAB — GLUCOSE, CAPILLARY
Glucose-Capillary: 140 mg/dL — ABNORMAL HIGH (ref 70–99)
Glucose-Capillary: 179 mg/dL — ABNORMAL HIGH (ref 70–99)

## 2021-12-13 MED ORDER — WARFARIN SODIUM 4 MG PO TABS
4.0000 mg | ORAL_TABLET | Freq: Once | ORAL | Status: AC
Start: 2021-12-13 — End: 2021-12-13
  Administered 2021-12-13: 4 mg via ORAL
  Filled 2021-12-13: qty 1

## 2021-12-13 MED ORDER — WARFARIN SODIUM 4 MG PO TABS: 4.0000 mg | ORAL_TABLET | Freq: Every day | ORAL | 0 refills | Status: DC

## 2021-12-13 MED ORDER — SODIUM BICARBONATE 650 MG PO TABS
650.0000 mg | ORAL_TABLET | Freq: Two times a day (BID) | ORAL | Status: DC
  Administered 2021-12-13: 650 mg via ORAL
  Filled 2021-12-13: qty 1

## 2021-12-13 MED ORDER — CEFADROXIL 500 MG PO CAPS: 500.0000 mg | ORAL_CAPSULE | Freq: Two times a day (BID) | ORAL | 0 refills | Status: DC

## 2021-12-13 MED ORDER — SODIUM BICARBONATE 650 MG PO TABS: 650.0000 mg | ORAL_TABLET | Freq: Two times a day (BID) | ORAL | 0 refills | Status: DC

## 2021-12-13 MED ORDER — HUMULIN 70/30 (70-30) 100 UNIT/ML ~~LOC~~ SUSP: 25.0000 [IU] | Freq: Two times a day (BID) | SUBCUTANEOUS | 3 refills | Status: DC

## 2021-12-13 NOTE — TOC Progression Note (Signed)
Transition of Care [facility]) - Progression Note    Patient Details  Name: [name] MRN: [id number] Date of Birth: November 28, 1955  Transition of Care [facility]) CM/SW Contact  Leeroy Cha, RN Phone Number: 12/13/2021, 4:11 PM  Clinical Narrative:     Rn for hhc and wound care. Ordered through [facility].  Expected Discharge Plan: [place] Barriers to Discharge: No Barriers Identified  Expected Discharge Plan and Services Expected Discharge Plan: [place]   Discharge Planning Services: CM Consult Post Acute Care Choice: Home Health                             HH Arranged: RN [place]: [place] Date [place]: 12/13/21 Time [place]: 1611 Representative spoke with at [place]: [place] ([place]) Interventions    Readmission Risk Interventions No flowsheet data found.

## 2021-12-13 NOTE — Progress Notes (Signed)
ANTICOAGULATION CONSULT NOTE - Follow Up Consult  Pharmacy Consult for warfarin Indication: hx pulmonary embolus  No Known Allergies  Patient Measurements: Height: 6\' 1"  (185.4 cm) Weight: 105.3 kg (232 lb 2.3 oz) IBW/kg (Calculated) : 79.9  Vital Signs: Temp: 98.6 F (37 C) (02/09 0500) Temp Source: Oral (02/09 0500) BP: 137/82 (02/09 0500) Pulse Rate: 79 (02/09 0000)  Labs: Recent Labs    12/11/21 0421 12/12/21 0354 12/13/21 0338  HGB 11.7* 11.7* 11.8*  HCT 36.1* 35.2* 35.8*  PLT 202 246 247  LABPROT 29.0* 29.4* 28.4*  INR 2.7* 2.8* 2.7*  CREATININE 2.07* 1.99* 1.95*     Estimated Creatinine Clearance: 46.8 mL/min (A) (by C-G formula based on SCr of 1.95 mg/dL (H)).   Medications:  - PTA warfarin regimen: 5 mg daily except 7.5 mg on Thu and Sat  Assessment: Patient is a 66 y.o M with hx PE/DVT on warfarin PTA, presented to the ED on 12/06/21 for worsening of LE cellulitis.  Warfarin resumed on admission.  Today, 12/13/2021: - INR therapeutic at 2.7 - Yesterday's warfarin dose 4 mg - CBC stable - No bleeding documented - Drug-drug intxns: concomitant use of cefadroxil with warfarin may result in increased INR and thereby increase the risk for bleeding - Serum creatinine elevated but trending down   Goal of Therapy:  INR 2-3 Monitor platelets by anticoagulation protocol: Yes   Plan:  - Repeat warfarin 4 mg PO x 1 today - Daily INR - Monitor for s/sx bleeding  Royetta Asal, PharmD, BCPS Clinical Pharmacist [place] Please utilize Amion for appropriate phone number to reach the unit pharmacist ([place]) 12/13/2021 8:36 AM

## 2021-12-13 NOTE — Plan of Care (Signed)
Discharge instructions reviewed with patient, questions answered, verbalized understanding.  Patient is to call coumadin clinic to have INR assessed on Monday 2/13 per Dr. Paulina Fusi instructions.  Patient voiced understanding of this.  Patient transported to main entrance via wheelchair to be taken home by friend.

## 2021-12-13 NOTE — Care Management Important Message (Signed)
Important Message  Patient Details IM Letter given to the Patient. Name: [name] MRN: [id number] Date of Birth: [birth year]   Medicare Important Message Given:  Yes     Kerin Salen 12/13/2021, 12:55 PM

## 2021-12-13 NOTE — Evaluation (Signed)
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 Patient admitted with sepsis secondary to cellulitis.   Clinical Impression   [name] is a 66 year old man who demonstrates the ability to ambulate and perform all ADLs needed in order to return home. Patient has no OT needs.       Recommendations for follow up therapy are one component of a multi-disciplinary discharge planning process, led by the attending physician.  Recommendations may be updated based on patient status, additional functional criteria and insurance authorization.   Follow Up Recommendations  No OT follow up    Assistance Recommended at Discharge None  Patient can return home with the following Assistance with cooking/housework    Functional Status Assessment  Patient has not had a recent decline in their functional status  Equipment Recommendations  None recommended by OT    Recommendations for Other Services       Precautions / Restrictions Precautions Precautions: Fall Restrictions Weight Bearing Restrictions: No      Mobility Bed Mobility                    Transfers                          Balance Overall balance assessment: Mild deficits observed, not formally tested                                         ADL either performed or assessed with clinical judgement   ADL Overall ADL's : At baseline                                             Vision Patient Visual Report: No change from baseline       Perception     Praxis      Pertinent Vitals/Pain Pain Assessment Pain Assessment: 0-10 Pain Score: 3  Pain Location: Rt  leg Pain Descriptors / Indicators: Grimacing     Hand Dominance Left   Extremity/Trunk Assessment Upper Extremity Assessment Upper Extremity Assessment: Overall WFL for tasks assessed   Lower Extremity Assessment Lower Extremity Assessment: Overall WFL for tasks assessed   Cervical / Trunk Assessment Cervical / Trunk Assessment: Normal   Communication Communication Communication: No difficulties   Cognition Arousal/Alertness: Awake/alert Behavior During Therapy: WFL for tasks assessed/performed Overall Cognitive Status: Within Functional Limits for tasks assessed                                       General Comments       Exercises     Shoulder Instructions      Home Living Family/patient expects to be discharged to:: Private residence Living Arrangements: Spouse/significant other Available Help at Discharge: Family Type of Home: House Home Access: Stairs to enter Technical brewer of Steps: 5 Entrance Stairs-Rails: Right (wall on Rt) Home Layout:  Two level;Able to live on main level with bedroom/bathroom     Bathroom Shower/Tub: Tub/shower unit   Bathroom Toilet: Standard Bathroom Accessibility: Yes   Home Equipment: BSC/3in1;Rolling Walker (2 wheels);Cane - single point          Prior Functioning/Environment Prior Level of Function : Independent/Modified Independent                        OT Problem List: Pain      OT Treatment/Interventions:      OT Goals(Current goals can be found in the care plan section) Acute Rehab OT Goals OT Goal Formulation: All assessment and education complete, DC therapy  OT Frequency:      Co-evaluation              AM-PAC OT "6 Clicks" Daily Activity     Outcome Measure Help from another person eating meals?: None Help from another person taking care of personal grooming?: None Help from another person toileting, which includes using toliet, bedpan, or urinal?: None Help from  another person bathing (including washing, rinsing, drying)?: None Help from another person to put on and taking off regular upper body clothing?: None Help from another person to put on and taking off regular lower body clothing?: None 6 Click Score: 24   End of Session Equipment Utilized During Treatment: Rolling walker (2 wheels) Nurse Communication: Mobility status  Activity Tolerance: Patient tolerated treatment well Patient left: in bed;with call bell/phone within reach  OT Visit Diagnosis: Pain                Time: 1445-1458 OT Time Calculation (min): 13 min Charges:  OT General Charges $OT Visit: 1 Visit OT Evaluation $OT Eval Low Complexity: 1 Low  Keshon Markovitz, OTR/L [place]  Office [phone] Pager: [phone]   Lenward Chancellor 12/13/2021, 3:52 PM

## 2021-12-13 NOTE — Evaluation (Signed)
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 Patient admitted with sepsis secondary to cellulitis.   Clinical Impression  [name] is 66 y.o. male admitted with above HPI and diagnosis. Patient is currently limited by functional impairments below (see PT problem list). Patient lives with his wife and is independent at baseline. Pt ambulated ~90' with supervision and RW/SPC and completed stair mobility at supervision level with no LOB today. Patient will benefit from continued skilled PT interventions to address impairments and progress independence with mobility, recommending follow up with [facility] or outpatient for Rt LE wound care, no PT needs after discharge. Acute PT will follow and progress as able.        Recommendations for follow up therapy are one component of a multi-disciplinary discharge planning process, led by the attending physician.  Recommendations may be updated based on patient status, additional functional criteria and insurance authorization.  Follow Up Recommendations No PT follow up (Wound care via [facility] RN or [place])    Assistance Recommended at Discharge None  Patient can return home with the following  A little help with walking and/or transfers;A little help with bathing/dressing/bathroom    Equipment Recommendations Rolling walker (2 wheels)  Recommendations for Other Services       Functional Status Assessment Patient has had a recent decline in their functional status and demonstrates the ability to make significant improvements in function in a reasonable and predictable amount of time.     Precautions / Restrictions Precautions Precautions:  Fall Restrictions Weight Bearing Restrictions: No      Mobility  Bed Mobility Overal bed mobility: Modified Independent Bed Mobility: Supine to Sit     Supine to sit: Modified independent (Device/Increase time), Supervision     General bed mobility comments: extra time needed    Transfers Overall transfer level: Needs assistance Equipment used: Rolling walker (2 wheels) Transfers: Sit to/from Stand Sit to Stand: Supervision           General transfer comment: pt using bil UE for power up, no extra time needed.    Ambulation/Gait Ambulation/Gait assistance: Supervision Gait Distance (Feet): 90 Feet Assistive device: Rolling walker (2 wheels), Straight cane Gait Pattern/deviations: Step-to pattern, Decreased stride length, Decreased weight shift to right, Decreased dorsiflexion - right, Decreased step length - right Gait velocity: decr     General Gait Details: supervision for gait with RW and for short distance with SPC. pt steady with good sequencing of both walker/cane, no LOB noted throughout and pt denied increase in LE pain.  Stairs Stairs: Yes Stairs assistance: Supervision Stair Management: One rail Right, Step to pattern, Alternating pattern, Forwards, With cane Number of Stairs: 4 General stair comments: pt alternated pattern between step to and alternating steps for ascending/descending stairs. no LOB noted.  Wheelchair Mobility    Modified Rankin (Stroke Patients Only)       Balance Overall balance assessment: Mild deficits observed, not formally tested                                           Pertinent Vitals/Pain Pain Assessment Pain  Assessment: 0-10 Pain Score: 3  Pain Location: Rt leg Pain Descriptors / Indicators: Aching, Discomfort Pain Intervention(s): Limited activity within patient's tolerance, Monitored during session, Repositioned    Home Living Family/patient expects to be discharged to:: Private  residence Living Arrangements: Spouse/significant other Available Help at Discharge: Family Type of Home: House Home Access: Stairs to enter Entrance Stairs-Rails: Right (wall on Rt) Entrance Stairs-Number of Steps: 5   Home Layout: Two level;Able to live on main level with bedroom/bathroom Home Equipment: BSC/3in1;Rolling Walker (2 wheels);Cane - single point      Prior Function Prior Level of Function : Independent/Modified Independent                     Hand Dominance   Dominant Hand: Left    Extremity/Trunk Assessment   Upper Extremity Assessment Upper Extremity Assessment: Overall WFL for tasks assessed    Lower Extremity Assessment Lower Extremity Assessment: Overall WFL for tasks assessed    Cervical / Trunk Assessment Cervical / Trunk Assessment: Normal  Communication   Communication: No difficulties  Cognition Arousal/Alertness: Awake/alert Behavior During Therapy: WFL for tasks assessed/performed Overall Cognitive Status: Within Functional Limits for tasks assessed                                          General Comments      Exercises     Assessment/Plan    PT Assessment Patient needs continued PT services  PT Problem List Decreased strength;Decreased balance;Decreased activity tolerance;Decreased mobility;Decreased knowledge of use of DME;Decreased knowledge of precautions;Obesity       PT Treatment Interventions DME instruction;Gait training;Stair training;Functional mobility training;Therapeutic activities;Therapeutic exercise;Balance training;Patient/family education    PT Goals (Current goals can be found in the Care Plan section)  Acute Rehab PT Goals Patient Stated Goal: get home and leg to get better PT Goal Formulation: With patient Time For Goal Achievement: 12/20/21 Potential to Achieve Goals: Good Additional Goals Additional Goal #1: LE wrapping for edema management    Frequency Min 3X/week      Co-evaluation               AM-PAC PT "6 Clicks" Mobility  Outcome Measure Help needed turning from your back to your side while in a flat bed without using bedrails?: None Help needed moving from lying on your back to sitting on the side of a flat bed without using bedrails?: A Little Help needed moving to and from a bed to a chair (including a wheelchair)?: A Little Help needed standing up from a chair using your arms (e.g., wheelchair or bedside chair)?: A Little Help needed to walk in hospital room?: A Little Help needed climbing 3-5 steps with a railing? : A Little 6 Click Score: 19    End of Session Equipment Utilized During Treatment: Gait belt Activity Tolerance: Patient tolerated treatment well Patient left: with call bell/phone within reach;in bed Nurse Communication: Mobility status PT Visit Diagnosis: Muscle weakness (generalized) (M62.81);Difficulty in walking, not elsewhere classified (R26.2);Unsteadiness on feet (R26.81)    Time: 6759-1638 PT Time Calculation (min) (ACUTE ONLY): 18 min   Charges:   PT Evaluation $PT Eval Low Complexity: 1 Low          Verner Mould, DPT Acute Rehabilitation Services Office [phone] Pager [phone]   Jacques Navy 12/13/2021, 4:17 PM

## 2021-12-13 NOTE — TOC Progression Note (Signed)
Transition of Care [facility]) - Progression Note    Patient Details  Name: [name] MRN: [id number] Date of Birth: 11-29-1955  [facility] CM/SW Contact  Leeroy Cha, RN Phone Number: 12/13/2021, 8:25 AM  Clinical Narrative:     CHART AND NOTES REVIEWED. NO TOC NEEDS PRESENT.       Expected Discharge Plan and Services                                                 Social Determinants of Health (SDOH) Interventions    Readmission Risk Interventions No flowsheet data found.

## 2021-12-13 NOTE — Discharge Summary (Signed)
Physician Discharge Summary  Demere Dotzler MMH:[id number] DOB: October 17, 1956 DOA: 12/06/2021  PCP: [name], MD  Admit date: 12/06/2021 Discharge date: 12/13/2021  Admitted From: Home  Disposition: Home   Recommendations for Outpatient Follow-up:  Follow up with PCP in 1-2 weeks Please obtain BMP/CBC in one week   Home Health: Yes, [facility] RN  Discharge Condition: Stable.  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  Patient reports he woke up,  started noticing that his right leg is painful to touch and turning red.  He denies any trauma or injury to the leg. He went to his PCPs office, his blood pressure was found to be low despite not taking her blood pressure medications.  His PCP was concerned about sepsis  and was sent in the ED.  Patient is being admitted for sepsis sec. to RLE cellulitis and started on IV antibiotics. Patient is on Coumadin for DVT. Bilateral venous duplex completed, showed age-indeterminate DVT in the left leg. Right leg is erythematous, swollen and tender.     Severe Sepsis secondary to cellulitis: Patient met sepsis criteria at the time of admission. He presented with tachycardia, leukocytosis, lactic acid 2.4,  RLE cellulitis. He was initially hypotensive with SBP in the 90s which improved with IV fluid bolus. Lactic acid normalized with IV hydration. Procalcitonin 9.86 , blood cultures: no growth to date.  MRSA nares: Negative.  UA unremarkable. No evidence of necrotizing fasciitis or drainable abscess on CT scan at this time. Continue Ancef, vancomycin discontinued. Blood cultures: no growth to date Sepsis physiology resolved. Treated with IV antibiotics.   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 CT lower extremity suggest cellulitis. Treated with  IV Ancef. Day 7 antibiotics. He will be discharge on 7 days oral cefadroxil.  ID recommended linezolid for 48 hours in addition to Ancef, completed 2/08.  RLE cellulitis is improving. Wound care consult. Will  order [facility] nurse for wound care.   Type 2 diabetes with neuropathy: Resume Trulicity. Resume 70/30 at discharge 25 units BID   Acute kidney injury on CKD stage IIIb: Likely prerenal from sepsis. Received  IV hydration. Renal function improving 2.35> 3.15 > 2.99>2.84>2.35>2.10>1.9 Avoid nephrotoxic medications. Renal function stable. Prior baseline 1.5--9.4 For metabolic acidosis will start oral bicarb.  Hold Ace at discharge   Essential hypertension: BP controlled off meds.    History of pulmonary embolism: Continue home Coumadin. Venous duplex shows no evidence of DVT in the right leg. Venous duplex shows evidence of age-indeterminate DVT in the left leg.     Discharge Diagnoses:  Principal Problem:   Sepsis due to cellulitis [facility]) Active Problems:   History of pulmonary embolism   Essential hypertension   AKI (acute kidney injury) ([place])   Type 2 diabetes mellitus with diabetic polyneuropathy, with long-term current use of insulin (HCC)   Chronic kidney disease, stage 3b (HCC)   Cellulitis of right lower extremity    Discharge Instructions  Discharge Instructions     Diet - low sodium heart healthy   Complete by: As directed    Discharge wound care:   Complete by: As directed    See above   Increase activity slowly   Complete by: As directed  Allergies as of 12/13/2021   No Known Allergies      Medication List     STOP taking these medications    cephALEXin 500 MG capsule Commonly known as: Keflex   losartan 50 MG tablet Commonly known as: COZAAR       TAKE these medications    Accu-Chek Aviva device by Other route. Use as instructed   Accu-Chek Guide test  strip Generic drug: glucose blood USE TO CHECK BLOOD SUGAR 3 TIMES DAILY.E11.65   Accu-Chek Softclix Lancets lancets Use as instructed to check blood sugar 3 times daily Dx is E11.65   BD Insulin Syringe U/F 31G X 5/16" 1 ML Misc Generic drug: Insulin Syringe-Needle U-100 USE AS DIRECTED   cefadroxil 500 MG capsule Commonly known as: DURICEF Take 1 capsule (500 mg total) by mouth 2 (two) times daily for 7 days.   diclofenac Sodium 1 % Gel Commonly known as: VOLTAREN Apply 1 application topically 4 (four) times daily as needed (pain).   gabapentin 300 MG capsule Commonly known as: NEURONTIN TAKE 1 CAPSULE BY MOUTH FOUR TIMES A DAY What changed: See the new instructions.   HumuLIN 70/30 (70-30) 100 UNIT/ML injection Generic drug: insulin NPH-regular Human Inject 25 Units into the skin 2 (two) times daily with a meal. Max daily up to 90 mL What changed:  how much to take how to take this when to take this   Insulin Pen Needle 32G X 4 MM Misc 1 Device by Does not apply route in the morning and at bedtime.   Insulin Syringes (Disposable) U-100 1 ML Misc 1 Device by Does not apply route as directed.   lovastatin 40 MG tablet Commonly known as: MEVACOR TAKE 1 TABLET (40 MG TOTAL) BY MOUTH AT BEDTIME. **NEEDS APPT BEFORE NEXT REFILL** What changed: additional instructions   methocarbamol 500 MG tablet Commonly known as: Robaxin Take 1 tablet (500 mg total) by mouth 2 (two) times daily as needed. To be taken after surgery What changed: reasons to take this   montelukast 10 MG tablet Commonly known as: SINGULAIR Take 1 tablet (10 mg total) by mouth at bedtime.   oxyCODONE-acetaminophen 5-325 MG tablet Commonly known as: Percocet Take 1-2 tablets by mouth every 6 (six) hours as needed. To be taken after surgery   sodium bicarbonate 650 MG tablet Take 1 tablet (650 mg total) by mouth 2 (two) times daily.   Trulicity 1.5 HW/3.8UE Sopn Generic drug: Dulaglutide Inject  1.5 mg into the skin once a week. What changed: additional instructions   warfarin 4 MG tablet Commonly known as: COUMADIN Take as directed. If you are unsure how to take this medication, talk to your nurse or doctor. Original instructions: Take 1 tablet (4 mg total) by mouth daily. What changed:  medication strength how much to take additional instructions               Discharge Care Instructions  (From admission, onward)           Start     Ordered   12/13/21 0000  Discharge wound care:       Comments: See above   12/13/21 1614            Follow-up Information     [facility] Follow up.   Specialty: Home Health Services Why: they will call to make the first home visit. Contact information: [address] [place] [address] [phone]  No Known Allergies  Consultations: ID   Procedures/Studies: DG Chest Port 1 View  Result Date: 12/07/2021 CLINICAL DATA:  Possible sepsis EXAM: PORTABLE CHEST 1 VIEW COMPARISON:  03/30/2021 FINDINGS: The heart size and mediastinal contours are within normal limits. Both lungs are clear. The visualized skeletal structures are unremarkable. IMPRESSION: No active disease. Electronically Signed   By: Inez Catalina M.D.   On: 12/07/2021 00:02   CT EXTREMITY LOWER RIGHT WO CONTRAST  Result Date: 12/07/2021 CLINICAL DATA:  Right lower extremity cellulitis, leukocytosis, on antibiotics EXAM: CT OF THE LOWER RIGHT EXTREMITY WITHOUT CONTRAST TECHNIQUE: Multidetector CT imaging of the right lower extremity was performed according to the standard protocol. RADIATION DOSE REDUCTION: This exam was performed according to the departmental dose-optimization program which includes automated exposure control, adjustment of the mA and/or kV according to patient size and/or use of iterative reconstruction technique. COMPARISON:  None. FINDINGS: No fracture is seen. No cortical destruction to suggest  acute osteomyelitis. No drainable fluid collection/abscess. Subcutaneous edema with trace fluid along the medial aspect of the calf (series 4/image 414), with overlying mild skin thickening (series 4/image 412), corresponding to the clinical history of cellulitis. Additional subcutaneous edema/cutaneous thickening along the medial and lateral ankle (series 4/image 493). IMPRESSION: Suspected cellulitis along the medial calf. No drainable fluid collection/abscess. No acute osseous abnormality Electronically Signed   By: Julian Hy M.D.   On: 12/07/2021 02:02   VAS [place] LOWER EXTREMITY VENOUS (DVT)  Result Date: 12/08/2021  Lower Venous DVT Study Patient Name:  ANDREW BLASIUS  Date of Exam:   12/07/2021 Medical Rec #: [id number]    Accession #:    [id number] Date of Birth: 1956-07-09    Patient Gender: M Patient Age:   2 years Exam Location:  [facility] Procedure:      VAS [place] LOWER EXTREMITY VENOUS (DVT) Referring Phys: Jennette Kettle --------------------------------------------------------------------------------  Indications: Hx of PE. Other Indications: RLE cellulits. Risk Factors: HX of PE (2009) DVT HX of DVT (2021). Anticoagulation: Coumadin. Limitations: Body habitus and poor ultrasound/tissue interface. Comparison Study: Previous exam on 02/09/2020 was positive for DVT in LLE (FV,                   PopV, PeroV) Performing Technologist: Jody Hill RVT, RDMS  Examination Guidelines: A complete evaluation includes B-mode imaging, spectral Doppler, color Doppler, and power Doppler as needed of all accessible portions of each vessel. Bilateral testing is considered an integral part of a complete examination. Limited examinations for reoccurring indications may be performed as noted. The reflux portion of the exam is performed with the patient in reverse Trendelenburg.  +---------+---------------+---------+-----------+----------+-------------------+  RIGHT      Compressibility Phasicity Spontaneity Properties Thrombus Aging       +---------+---------------+---------+-----------+----------+-------------------+  CFV       Full            Yes       Yes                                         +---------+---------------+---------+-----------+----------+-------------------+  SFJ       Full                                                                  +---------+---------------+---------+-----------+----------+-------------------+  FV Prox   Full            Yes       Yes                                         +---------+---------------+---------+-----------+----------+-------------------+  FV Mid    Full            Yes       Yes                                         +---------+---------------+---------+-----------+----------+-------------------+  FV Distal Full            Yes       Yes                                         +---------+---------------+---------+-----------+----------+-------------------+  PFV       Full                                                                  +---------+---------------+---------+-----------+----------+-------------------+  POP       Full            Yes       Yes                                         +---------+---------------+---------+-----------+----------+-------------------+  PTV       Full                                             Not well visualized  +---------+---------------+---------+-----------+----------+-------------------+  PERO      Full                                             Not well visualized  +---------+---------------+---------+-----------+----------+-------------------+   +---------+---------------+---------+-----------+----------+-----------------+  LEFT      Compressibility Phasicity Spontaneity Properties Thrombus Aging     +---------+---------------+---------+-----------+----------+-----------------+  CFV       Full            Yes       Yes                                        +---------+---------------+---------+-----------+----------+-----------------+  SFJ       Full                                                                +---------+---------------+---------+-----------+----------+-----------------+  FV Prox   Full            Yes       Yes                                       +---------+---------------+---------+-----------+----------+-----------------+  FV Mid    None            No        No                     Age Indeterminate  +---------+---------------+---------+-----------+----------+-----------------+  FV Distal Partial         Yes       Yes                    Age Indeterminate  +---------+---------------+---------+-----------+----------+-----------------+  PFV       Full                                                                +---------+---------------+---------+-----------+----------+-----------------+  POP       Partial         Yes       Yes                    Age Indeterminate  +---------+---------------+---------+-----------+----------+-----------------+  PTV       Full                                                                +---------+---------------+---------+-----------+----------+-----------------+  PERO      Full                                                                +---------+---------------+---------+-----------+----------+-----------------+     Summary: BILATERAL: -No evidence of popliteal cyst, bilaterally. RIGHT: - There is no evidence of deep vein thrombosis in the lower extremity. - There is no evidence of superficial venous thrombosis.  subcutaneous edema seen in area of calf.  LEFT: - Findings consistent with age indeterminate deep vein thrombosis involving the left femoral vein, and left popliteal vein. - There is no evidence of superficial venous thrombosis.  *See table(s) above for measurements and observations. Electronically signed by Harold Barban MD on 12/08/2021 at 5:32:28 PM.    Final      Subjective: Pain improving.,  redness improving   Discharge Exam: Vitals:   12/13/21 0000 12/13/21 0500  BP: 124/64 137/82  Pulse: 79   Resp: (!) 23   Temp:  98.6 F (37 C)  SpO2: 94% 97%     General: Pt is alert, awake, not in acute distress Cardiovascular: RRR, S1/S2 +, no rubs, no gallops Respiratory: CTA bilaterally, no wheezing, no rhonchi Abdominal: Soft, NT, ND, bowel sounds + Extremities:  no edema, no cyanosis    The results of significant diagnostics from this hospitalization (including imaging, microbiology, ancillary and laboratory) are listed below for reference.     Microbiology: Recent Results (from the past 240 hour(s))  Blood Culture (routine x 2)     Status: None   Collection Time: 12/06/21 11:37 PM   Specimen: BLOOD RIGHT WRIST  Result Value Ref Range Status   Specimen Description   Final    BLOOD RIGHT WRIST Performed at [facility] [address] [place], [place] [address]    Special Requests   Final    BOTTLES DRAWN AEROBIC AND ANAEROBIC Blood Culture adequate volume Performed at [facility] [address] [place], [place] [address]    Culture   Final    NO GROWTH 5 DAYS Performed at [facility], [place] [address] [place], [place] [address]    Report Status 12/12/2021 FINAL  Final  Blood Culture (routine x 2)     Status: None   Collection Time: 12/06/21 11:37 PM   Specimen: Left Antecubital; Blood  Result Value Ref Range Status   Specimen Description   Final    LEFT ANTECUBITAL Performed at [facility] [address] [place], [place] [address]    Special Requests   Final    BOTTLES DRAWN AEROBIC AND ANAEROBIC Blood Culture adequate volume Performed at [facility] [address], [place], [place] [address]    Culture   Final    NO GROWTH 5 DAYS Performed at [facility], [place] [address], [place], [place] [address]    Report Status 12/12/2021 FINAL  Final  Resp Panel by  RT-PCR (Flu A&B, Covid) Nasopharyngeal Swab     Status: None   Collection Time: 12/06/21 11:56 PM   Specimen: Nasopharyngeal Swab; Nasopharyngeal(NP) swabs in vial transport medium  Result Value Ref Range Status   SARS Coronavirus 2 by RT PCR NEGATIVE NEGATIVE Final    Comment: (NOTE) SARS-CoV-2 target nucleic acids are NOT DETECTED.  The SARS-CoV-2 RNA is generally detectable in upper respiratory specimens during the acute phase of infection. The lowest concentration of SARS-CoV-2 viral copies this assay can detect is 138 copies/mL. A negative result does not preclude SARS-Cov-2 infection and should not be used as the sole basis for treatment or other patient management decisions. A negative result may occur with  improper specimen collection/handling, submission of specimen other than nasopharyngeal swab, presence of viral mutation(s) within the areas targeted by this assay, and inadequate number of viral copies(<138 copies/mL). A negative result must be combined with clinical observations, patient history, and epidemiological information. The expected result is Negative.  Fact Sheet for Patients:  EntrepreneurPulse.com.au  Fact Sheet for Healthcare Providers:  IncredibleEmployment.be  This test is no t yet approved or cleared by the [place] FDA and  has been authorized for detection and/or diagnosis of SARS-CoV-2 by FDA under an Emergency Use Authorization (EUA). This EUA will remain  in effect (meaning this test can be used) for the duration of the COVID-19 declaration under Section 564(b)(1) of the Act, 21 U.S.C.section 360bbb-3(b)(1), unless the authorization is terminated  or revoked sooner.       Influenza A by PCR NEGATIVE NEGATIVE Final   Influenza B by PCR NEGATIVE NEGATIVE Final    Comment: (NOTE) The Xpert Xpress SARS-CoV-2/FLU/RSV plus assay is intended as an aid in the diagnosis of influenza from Nasopharyngeal swab  specimens and should not  be used as a sole basis for treatment. Nasal washings and aspirates are unacceptable for Xpert Xpress SARS-CoV-2/FLU/RSV testing.  Fact Sheet for Patients: EntrepreneurPulse.com.au  Fact Sheet for Healthcare Providers: IncredibleEmployment.be  This test is not yet approved or cleared by the [place] FDA and has been authorized for detection and/or diagnosis of SARS-CoV-2 by FDA under an Emergency Use Authorization (EUA). This EUA will remain in effect (meaning this test can be used) for the duration of the COVID-19 declaration under Section 564(b)(1) of the Act, 21 U.S.C. section 360bbb-3(b)(1), unless the authorization is terminated or revoked.  Performed at [facility], [place] [address], [place], [place] [address]   Urine Culture     Status: None   Collection Time: 12/07/21  2:37 AM   Specimen: In/Out Cath Urine  Result Value Ref Range Status   Specimen Description   Final    IN/OUT CATH URINE Performed at [facility] [address], [place], [place] [address]    Special Requests   Final    NONE Performed at [facility], [place] [address], [place], [place] [address]    Culture   Final    NO GROWTH Performed at [facility], [place] [address] [place], [place] [address]    Report Status 12/08/2021 FINAL  Final  MRSA Next Gen by PCR, Nasal     Status: None   Collection Time: 12/07/21  2:51 AM   Specimen: Nasal Mucosa; Nasal Swab  Result Value Ref Range Status   MRSA by PCR Next Gen NOT DETECTED NOT DETECTED Final    Comment: (NOTE) The GeneXpert MRSA Assay (FDA approved for NASAL specimens only), is one component of a comprehensive MRSA colonization surveillance program. It is not intended to diagnose MRSA infection nor to guide or monitor treatment for MRSA infections. Test performance is not FDA approved in patients less than 6  years old. Performed at [facility], [place] [address], [place],  [address]      Labs: BNP (last 3 results) No results for input(s): BNP in the last 8760 hours. Basic Metabolic Panel: Recent Labs  Lab 12/08/21 1330 12/09/21 0425 12/10/21 0336 12/11/21 0421 12/12/21 0354 12/13/21 0338  NA 134* 138 138 138 136 136  K 3.4* 3.7 3.4* 3.8 3.9 4.1  CL 105 108 109 111 109 111  CO2 19* 19* 18* 19* 18* 18*  GLUCOSE 112* 96 91 140* 163* 168*  BUN 59* 56* 50* 48* 49* 47*  CREATININE 2.99* 2.84* 2.35* 2.07* 1.99* 1.95*  CALCIUM 7.9* 8.2* 7.7* 7.9* 7.8* 7.7*  MG  --  2.0  --   --   --   --   PHOS 2.6 3.1   3.0  --   --   --   --    Liver Function Tests: Recent Labs  Lab 12/06/21 2337 12/08/21 1330 12/09/21 0425  AST 21  --   --   ALT 21  --   --   ALKPHOS 50  --   --   BILITOT 0.7  --   --   PROT 6.9  --   --   ALBUMIN 3.3* 2.9* 2.7*   No results for input(s): LIPASE, AMYLASE in the last 168 hours. No results for input(s): AMMONIA in the last 168 hours. CBC: Recent Labs  Lab 12/06/21 2337 12/07/21 0235 12/08/21 0433 12/11/21 0421 12/12/21 0354 12/13/21 0338  WBC 17.3* 17.4* 12.9* 10.5 9.3 9.6  NEUTROABS 16.3*  --   --   --   --   --  HGB 12.2* 11.4* 11.0* 11.7* 11.7* 11.8*  HCT 38.5* 35.5* 33.3* 36.1* 35.2* 35.8*  MCV 96.3 94.9 92.5 91.9 90.5 90.9  PLT 152 127* 124* 202 246 247   Cardiac Enzymes: No results for input(s): CKTOTAL, CKMB, CKMBINDEX, TROPONINI in the last 168 hours. BNP: Invalid input(s): POCBNP CBG: Recent Labs  Lab 12/12/21 1132 12/12/21 1727 12/12/21 2129 12/13/21 0741 12/13/21 1150  GLUCAP 141* 117* 190* 140* 179*   D-Dimer No results for input(s): DDIMER in the last 72 hours. Hgb A1c No results for input(s): HGBA1C in the last 72 hours. Lipid Profile No results for input(s): CHOL, HDL, LDLCALC, TRIG, CHOLHDL, LDLDIRECT in the last 72 hours. Thyroid function studies No results for input(s): TSH, T4TOTAL,  T3FREE, THYROIDAB in the last 72 hours.  Invalid input(s): FREET3 Anemia work up No results for input(s): VITAMINB12, FOLATE, FERRITIN, TIBC, IRON, RETICCTPCT in the last 72 hours. Urinalysis    Component Value Date/Time   COLORURINE YELLOW 12/07/2021 0237   APPEARANCEUR CLEAR 12/07/2021 0237   LABSPEC <1.005 (L) 12/07/2021 0237   PHURINE 5.5 12/07/2021 0237   GLUCOSEU NEGATIVE 12/07/2021 0237   GLUCOSEU NEGATIVE 07/10/2021 1129   HGBUR MODERATE (A) 12/07/2021 0237   BILIRUBINUR NEGATIVE 12/07/2021 0237   KETONESUR NEGATIVE 12/07/2021 0237   PROTEINUR NEGATIVE 12/07/2021 0237   UROBILINOGEN 0.2 07/10/2021 1129   NITRITE NEGATIVE 12/07/2021 0237   LEUKOCYTESUR NEGATIVE 12/07/2021 0237   Sepsis Labs Invalid input(s): PROCALCITONIN,  WBC,  LACTICIDVEN Microbiology Recent Results (from the past 240 hour(s))  Blood Culture (routine x 2)     Status: None   Collection Time: 12/06/21 11:37 PM   Specimen: BLOOD RIGHT WRIST  Result Value Ref Range Status   Specimen Description   Final    BLOOD RIGHT WRIST Performed at [facility] [address] [place], [place] [address]    Special Requests   Final    BOTTLES DRAWN AEROBIC AND ANAEROBIC Blood Culture adequate volume Performed at [facility] [address] [place], [place] [address]    Culture   Final    NO GROWTH 5 DAYS Performed at [facility], [place] [address] [place], [place] [address]    Report Status 12/12/2021 FINAL  Final  Blood Culture (routine x 2)     Status: None   Collection Time: 12/06/21 11:37 PM   Specimen: Left Antecubital; Blood  Result Value Ref Range Status   Specimen Description   Final    LEFT ANTECUBITAL Performed at [facility] [address] [place], [place] [address]    Special Requests   Final    BOTTLES DRAWN AEROBIC AND ANAEROBIC Blood Culture adequate volume Performed at [facility] [address]  [place], [place] [address]    Culture   Final    NO GROWTH 5 DAYS Performed at [facility], [place] [address] [place], [place] [address]    Report Status 12/12/2021 FINAL  Final  Resp Panel by RT-PCR (Flu A&B, Covid) Nasopharyngeal Swab     Status: None   Collection Time: 12/06/21 11:56 PM   Specimen: Nasopharyngeal Swab; Nasopharyngeal(NP) swabs in vial transport medium  Result Value Ref Range Status   SARS Coronavirus 2 by RT PCR NEGATIVE NEGATIVE Final    Comment: (NOTE) SARS-CoV-2 target nucleic acids are NOT DETECTED.  The SARS-CoV-2 RNA is generally detectable in upper respiratory specimens during the acute phase of infection. The lowest concentration of SARS-CoV-2 viral copies  this assay can detect is 138 copies/mL. A negative result does not preclude SARS-Cov-2 infection and should not be used as the sole basis for treatment or other patient management decisions. A negative result may occur with  improper specimen collection/handling, submission of specimen other than nasopharyngeal swab, presence of viral mutation(s) within the areas targeted by this assay, and inadequate number of viral copies(<138 copies/mL). A negative result must be combined with clinical observations, patient history, and epidemiological information. The expected result is Negative.  Fact Sheet for Patients:  EntrepreneurPulse.com.au  Fact Sheet for Healthcare Providers:  IncredibleEmployment.be  This test is no t yet approved or cleared by the [place] FDA and  has been authorized for detection and/or diagnosis of SARS-CoV-2 by FDA under an Emergency Use Authorization (EUA). This EUA will remain  in effect (meaning this test can be used) for the duration of the COVID-19 declaration under Section 564(b)(1) of the Act, 21 U.S.C.section 360bbb-3(b)(1), unless the authorization is terminated  or revoked sooner.       Influenza A by PCR NEGATIVE NEGATIVE  Final   Influenza B by PCR NEGATIVE NEGATIVE Final    Comment: (NOTE) The Xpert Xpress SARS-CoV-2/FLU/RSV plus assay is intended as an aid in the diagnosis of influenza from Nasopharyngeal swab specimens and should not be used as a sole basis for treatment. Nasal washings and aspirates are unacceptable for Xpert Xpress SARS-CoV-2/FLU/RSV testing.  Fact Sheet for Patients: EntrepreneurPulse.com.au  Fact Sheet for Healthcare Providers: IncredibleEmployment.be  This test is not yet approved or cleared by the [place] FDA and has been authorized for detection and/or diagnosis of SARS-CoV-2 by FDA under an Emergency Use Authorization (EUA). This EUA will remain in effect (meaning this test can be used) for the duration of the COVID-19 declaration under Section 564(b)(1) of the Act, 21 U.S.C. section 360bbb-3(b)(1), unless the authorization is terminated or revoked.  Performed at [facility], [place] [address], [place], [place] [address]   Urine Culture     Status: None   Collection Time: 12/07/21  2:37 AM   Specimen: In/Out Cath Urine  Result Value Ref Range Status   Specimen Description   Final    IN/OUT CATH URINE Performed at [place] [address], [place], [place] [address]    Special Requests   Final    NONE Performed at [facility], [place] [address] [place], [place] [address]    Culture   Final    NO GROWTH Performed at [facility], [place] [address] [place], [place] [address]    Report Status 12/08/2021 FINAL  Final  MRSA Next Gen by PCR, Nasal     Status: None   Collection Time: 12/07/21  2:51 AM   Specimen: Nasal Mucosa; Nasal Swab  Result Value Ref Range Status   MRSA by PCR Next Gen NOT DETECTED NOT DETECTED Final    Comment: (NOTE) The GeneXpert MRSA Assay (FDA approved for NASAL specimens only), is one component of a comprehensive MRSA colonization  surveillance program. It is not intended to diagnose MRSA infection nor to guide or monitor treatment for MRSA infections. Test performance is not FDA approved in patients less than 53 years old. Performed at [facility], [place] [address] [place], [place] [address]      Time coordinating discharge: 40 minutes  SIGNED:   Elmarie Shiley, MD  [facility]

## 2021-12-17 ENCOUNTER — Ambulatory Visit (INDEPENDENT_AMBULATORY_CARE_PROVIDER_SITE_OTHER): Payer: Medicare Other

## 2021-12-17 ENCOUNTER — Other Ambulatory Visit: Payer: Self-pay

## 2021-12-17 DIAGNOSIS — Z7901 Long term (current) use of anticoagulants: Secondary | ICD-10-CM | POA: Diagnosis not present

## 2021-12-17 DIAGNOSIS — G4733 Obstructive sleep apnea (adult) (pediatric): Secondary | ICD-10-CM | POA: Diagnosis not present

## 2021-12-17 LAB — POCT INR: INR: 2 (ref 2.0–3.0)

## 2021-12-17 NOTE — Progress Notes (Addendum)
Pt was in ER and hospitalized for sepsis. He was prescribed Duricef for 7 days on 2/9. Pt reported he was prescribed 4 mg warfarin when d/c from the hospital due to the interaction with the abx and was told to take those until finished with the abx.  Take 4 mg today, take 4 mg tomorrow, and take 4 mg on Wednesday and then continue to take 5 mg daily except take 7.5 mg on Thurs and Sat. Recheck in 2 wks.

## 2021-12-17 NOTE — Progress Notes (Signed)
Post DC note 12/17/21 at 1800 Pt called the hospital and stated that the [facility] agency did not show up, so he called the agency.  They stated they were not able to see him.  Message sent to hospital [facility] team to contact the agency and the patient. Andre Lefort

## 2021-12-17 NOTE — Patient Instructions (Addendum)
Pre visit review using our clinic review tool, if applicable. No additional management support is needed unless otherwise documented below in the visit note.  Take 4 mg today, take 4 mg tomorrow, and take 4 mg on Wednesday and then continue to take 5 mg daily except take 7.5 mg on Thurs and Sat. Recheck in 2 wks.

## 2021-12-18 ENCOUNTER — Other Ambulatory Visit: Payer: Self-pay

## 2021-12-19 ENCOUNTER — Encounter: Payer: Self-pay | Admitting: Family Medicine

## 2021-12-19 ENCOUNTER — Ambulatory Visit: Payer: Medicare Other

## 2021-12-19 ENCOUNTER — Ambulatory Visit (INDEPENDENT_AMBULATORY_CARE_PROVIDER_SITE_OTHER): Payer: Medicare Other | Admitting: Family Medicine

## 2021-12-19 VITALS — BP 134/68 | HR 92 | Temp 97.4°F | Ht 73.0 in | Wt 241.4 lb

## 2021-12-19 DIAGNOSIS — R600 Localized edema: Secondary | ICD-10-CM | POA: Diagnosis not present

## 2021-12-19 DIAGNOSIS — L03115 Cellulitis of right lower limb: Secondary | ICD-10-CM | POA: Diagnosis not present

## 2021-12-19 DIAGNOSIS — N184 Chronic kidney disease, stage 4 (severe): Secondary | ICD-10-CM | POA: Diagnosis not present

## 2021-12-19 LAB — CBC
HCT: 33.6 % — ABNORMAL LOW (ref 39.0–52.0)
Hemoglobin: 11 g/dL — ABNORMAL LOW (ref 13.0–17.0)
MCHC: 32.8 g/dL (ref 30.0–36.0)
MCV: 91.9 fl (ref 78.0–100.0)
Platelets: 338 10*3/uL (ref 150.0–400.0)
RBC: 3.65 Mil/uL — ABNORMAL LOW (ref 4.22–5.81)
RDW: 14.6 % (ref 11.5–15.5)
WBC: 9.7 10*3/uL (ref 4.0–10.5)

## 2021-12-19 MED ORDER — CEFADROXIL 500 MG PO CAPS: ORAL_CAPSULE | ORAL | 0 refills | Status: DC

## 2021-12-19 MED ORDER — FUROSEMIDE 20 MG PO TABS: 20.0000 mg | ORAL_TABLET | Freq: Every day | ORAL | 0 refills | Status: DC

## 2021-12-19 NOTE — Progress Notes (Signed)
Established Patient Office Visit  Subjective:  Patient ID: [name], male    DOB: [birth year]  Age: 66 y.o. MRN: [id number]  CC:  Chief Complaint  Patient presents with   Hospitalization [facility] follow up, pt not sure if antibiotics should be extended. Would like referral for wound care either in home or clinic.     HPI [name] presents for hospital discharge follow-up status post diagnoses cellulitis of right lower leg and near sepsis.  He was treated with IV Ancef and discharged on cefadroxil 500 mg twice daily for 7 days.  He will finish this tomorrow evening.  Feels as though he is doing some better.  He has not been febrile.  Wound continues to seep.  Tells that his diabetes is relatively controlled.  Last hemoglobin A1c was 7.1 by his account.  Follow-up is scheduled with endocrinology next month.  ABIs obtained in 2021 were essentially normal.  Noncompressible arteries were noted.  He had developed spontaneous erythema and swelling in his lower leg 2 weeks ago.  He was seen at urgent care and noted to be lightheaded, hypotensive with erythema and swelling of his lower leg.  Follow-up at the emergency room was advised.  He lives at home with his significant other who works outside of the home.  Past Medical History:  Diagnosis Date   Arthritis    Chronic kidney disease    reduced kidney function. Stage III   Diabetes mellitus without complication (HCC)    DVT (deep venous thrombosis) [facility])    age indeterminate LLE DVT 02/09/20   Dyspnea    On exertion   History of kidney stones    History of pulmonary embolus (PE)    Hypertension    Lipidemia    Prostate enlargement    Pulmonary embolism ([place]) 2009   Sleep apnea     Past Surgical History:  Procedure Laterality Date   CYSTOSCOPY W/ URETERAL STENT PLACEMENT Left 10/08/2020   Procedure: CYSTOSCOPY WITH RETROGRADE PYELOGRAM/URETERAL STENT PLACEMENT;  Surgeon: Irine Seal, MD;  Location: [facility];  Service:  Urology;  Laterality: Left;   CYSTOSCOPY/URETEROSCOPY/HOLMIUM LASER/STENT PLACEMENT Right 06/07/2020   Procedure: CYSTOSCOPY/URETEROSCOPY/HOLMIUM LASER/STENT PLACEMENT;  Surgeon: Lucas Mallow, MD;  [facility];  Service: Urology;  Laterality: Right;   CYSTOSCOPY/URETEROSCOPY/HOLMIUM LASER/STENT PLACEMENT Left 10/19/2020   Procedure: CYSTOSCOPY LEFT URETEROSCOPY/HOLMIUM LASER/STENT EXCHANGE;  Surgeon: Irine Seal, MD;  [facility];  Service: Urology;  Laterality: Left;   TOTAL KNEE ARTHROPLASTY Left 08/06/2021   Procedure: LEFT TOTAL KNEE ARTHROPLASTY;  Surgeon: Leandrew Koyanagi, MD;  Location: [facility];  Service: Orthopedics;  Laterality: Left;    Family History  Problem Relation Age of Onset   Cancer Mother    Heart disease Father     Social History   Socioeconomic History   Marital status: Significant Other    Spouse name: Not on file   Number of children: Not on file   Years of education: Not on file   Highest education level: Not on file  Occupational History   Occupation: retired  Tobacco Use   Smoking status: Never   Smokeless tobacco: Never  Vaping Use   Vaping Use: Never used  Substance and Sexual Activity   Alcohol use: Yes    Alcohol/week: 1.0 standard drink    Types: 1 Cans of beer per week    Comment: social   Drug use: Never   Sexual activity: Yes  Other Topics Concern  Not on file  Social History Narrative   Lives with Friend   Right handed   Drinks 1-2 cups caffeine daily   Social Determinants of Health   Financial Resource Strain: Not on file  Food Insecurity: Not on file  Transportation Needs: Not on file  Physical Activity: Not on file  Stress: Not on file  Social Connections: Not on file  Intimate Partner Violence: Not on file    Outpatient Medications Prior to Visit  Medication Sig Dispense Refill   ACCU-CHEK GUIDE test strip USE TO CHECK BLOOD SUGAR 3 TIMES DAILY.E11.65 100 strip 12   Accu-Chek Softclix Lancets lancets Use as  instructed to check blood sugar 3 times daily Dx is E11.65 100 each 2   BD INSULIN SYRINGE U/F 31G X 5/16" 1 ML MISC USE AS DIRECTED 100 each 11   Blood Glucose Monitoring Suppl (ACCU-CHEK AVIVA) device by Other route. Use as instructed     diclofenac Sodium (VOLTAREN) 1 % GEL Apply 1 application topically 4 (four) times daily as needed (pain).     Dulaglutide (TRULICITY) 1.5 IT/1.9LV SOPN Inject 1.5 mg into the skin once a week. (Patient taking differently: Inject 1.5 mg into the skin once a week. Sundays) 6 mL 3   gabapentin (NEURONTIN) 300 MG capsule TAKE 1 CAPSULE BY MOUTH FOUR TIMES A DAY (Patient taking differently: Take 300 mg by mouth 2 (two) times daily.) 120 capsule 0   insulin NPH-regular Human (HUMULIN 70/30) (70-30) 100 UNIT/ML injection Inject 25 Units into the skin 2 (two) times daily with a meal. Max daily up to 90 mL 90 mL 3   Insulin Pen Needle 32G X 4 MM MISC 1 Device by Does not apply route in the morning and at bedtime. 200 each 3   Insulin Syringes, Disposable, U-100 1 ML MISC 1 Device by Does not apply route as directed. 100 each 11   lovastatin (MEVACOR) 40 MG tablet TAKE 1 TABLET (40 MG TOTAL) BY MOUTH AT BEDTIME. **NEEDS APPT BEFORE NEXT REFILL** (Patient taking differently: Take 40 mg by mouth at bedtime.) 90 tablet 0   methocarbamol (ROBAXIN) 500 MG tablet Take 1 tablet (500 mg total) by mouth 2 (two) times daily as needed. To be taken after surgery (Patient taking differently: Take 500 mg by mouth 2 (two) times daily as needed for muscle spasms. To be taken after surgery) 20 tablet 2   montelukast (SINGULAIR) 10 MG tablet Take 1 tablet (10 mg total) by mouth at bedtime. 90 tablet 1   sodium bicarbonate 650 MG tablet Take 1 tablet (650 mg total) by mouth 2 (two) times daily. 30 tablet 0   warfarin (COUMADIN) 4 MG tablet Take 1 tablet (4 mg total) by mouth daily. 30 tablet 0   cefadroxil (DURICEF) 500 MG capsule Take 1 capsule (500 mg total) by mouth 2 (two) times daily for 7  days. 14 capsule 0   oxyCODONE-acetaminophen (PERCOCET) 5-325 MG tablet Take 1-2 tablets by mouth every 6 (six) hours as needed. To be taken after surgery 40 tablet 0   No facility-administered medications prior to visit.    No Known Allergies  ROS Review of Systems  Constitutional:  Negative for chills, diaphoresis, fatigue, fever and unexpected weight change.  Respiratory: Negative.    Cardiovascular: Negative.   Gastrointestinal: Negative.   Genitourinary: Negative.   Skin:  Positive for color change.  Neurological:  Negative for speech difficulty, weakness and light-headedness.     Objective:    Physical Exam  Vitals and nursing note reviewed.  Constitutional:      Appearance: Normal appearance.  HENT:     Head: Normocephalic and atraumatic.  Eyes:     General: No scleral icterus.       Right eye: No discharge.        Left eye: No discharge.     Extraocular Movements: Extraocular movements intact.     Conjunctiva/sclera: Conjunctivae normal.  Skin:    General: Skin is warm and dry.     Findings: Erythema present.       Neurological:     Mental Status: He is alert and oriented to person, place, and time.  Psychiatric:        Mood and Affect: Mood normal.        Behavior: Behavior normal.    BP 134/68 (BP Location: Right Arm, Patient Position: Sitting, Cuff Size: Large)    Pulse 92    Temp (!) 97.4 F (36.3 C) (Temporal)    Ht 6\' 1"  (1.854 m)    Wt 241 lb 6.4 oz (109.5 kg)    SpO2 98%    BMI 31.85 kg/m  Wt Readings from Last 3 Encounters:  12/19/21 241 lb 6.4 oz (109.5 kg)  12/10/21 232 lb 2.3 oz (105.3 kg)  12/06/21 239 lb (108.4 kg)     Health Maintenance Due  Topic Date Due   Hepatitis C Screening  Never done   TETANUS/TDAP  Never done   COLONOSCOPY (Pts 45-2yrs Insurance coverage will need to be confirmed)  Never done   Zoster Vaccines- Shingrix (1 of 2) Never done   FOOT EXAM  02/22/2021   INFLUENZA VACCINE  06/04/2021   Pneumonia Vaccine 1+  Years old (2 - PCV) 06/08/2021   URINE MICROALBUMIN  08/28/2021    There are no preventive care reminders to display for this patient.  Lab Results  Component Value Date   TSH 1.344 02/09/2020   Lab Results  Component Value Date   WBC 9.6 12/13/2021   HGB 11.8 (L) 12/13/2021   HCT 35.8 (L) 12/13/2021   MCV 90.9 12/13/2021   PLT 247 12/13/2021   Lab Results  Component Value Date   NA 136 12/13/2021   K 4.1 12/13/2021   CO2 18 (L) 12/13/2021   GLUCOSE 168 (H) 12/13/2021   BUN 47 (H) 12/13/2021   CREATININE 1.95 (H) 12/13/2021   BILITOT 0.7 12/06/2021   ALKPHOS 50 12/06/2021   AST 21 12/06/2021   ALT 21 12/06/2021   PROT 6.9 12/06/2021   ALBUMIN 2.7 (L) 12/09/2021   CALCIUM 7.7 (L) 12/13/2021   ANIONGAP 7 12/13/2021   GFR 31.97 (L) 12/06/2021   Lab Results  Component Value Date   CHOL 156 12/22/2019   Lab Results  Component Value Date   HDL 41.40 12/22/2019   No results found for: [facility] Lab Results  Component Value Date   TRIG 251.0 (H) 12/22/2019   Lab Results  Component Value Date   CHOLHDL 4 12/22/2019   Lab Results  Component Value Date   HGBA1C 6.4 (H) 12/06/2021      Assessment & Plan:   Problem List Items Addressed This Visit       Other   Cellulitis of right lower extremity - Primary   Relevant Medications   cefadroxil (DURICEF) 500 MG capsule   Other Relevant Orders   CBC   AMB referral to wound care center   Other Visit Diagnoses     Leg edema, right  Relevant Medications   furosemide (LASIX) 20 MG tablet   CKD (chronic kidney disease) stage 4, GFR 15-29 ml/min (HCC)       Relevant Orders   Basic metabolic panel       Meds ordered this encounter  Medications   cefadroxil (DURICEF) 500 MG capsule    Sig: Take one daily    Dispense:  10 capsule    Refill:  0   furosemide (LASIX) 20 MG tablet    Sig: Take 1 tablet (20 mg total) by mouth daily.    Dispense:  7 tablet    Refill:  0    Follow-up: Return in about 1  week (around 12/26/2021), or Please elevate leg above the level of heart as much as possible..  Urged elevation.  Urged hydration.  10 more days of cefadroxil 500 mg daily secondary to current GFR.  7 days of Lasix for relief of edema.  [name], MD

## 2021-12-20 DIAGNOSIS — Z7901 Long term (current) use of anticoagulants: Secondary | ICD-10-CM | POA: Diagnosis not present

## 2021-12-20 DIAGNOSIS — N1832 Chronic kidney disease, stage 3b: Secondary | ICD-10-CM | POA: Diagnosis not present

## 2021-12-20 DIAGNOSIS — E1122 Type 2 diabetes mellitus with diabetic chronic kidney disease: Secondary | ICD-10-CM | POA: Diagnosis not present

## 2021-12-20 DIAGNOSIS — I129 Hypertensive chronic kidney disease with stage 1 through stage 4 chronic kidney disease, or unspecified chronic kidney disease: Secondary | ICD-10-CM | POA: Diagnosis not present

## 2021-12-20 DIAGNOSIS — N179 Acute kidney failure, unspecified: Secondary | ICD-10-CM | POA: Diagnosis not present

## 2021-12-20 DIAGNOSIS — Z7985 Long-term (current) use of injectable non-insulin antidiabetic drugs: Secondary | ICD-10-CM | POA: Diagnosis not present

## 2021-12-20 DIAGNOSIS — Z794 Long term (current) use of insulin: Secondary | ICD-10-CM | POA: Diagnosis not present

## 2021-12-20 DIAGNOSIS — Z86718 Personal history of other venous thrombosis and embolism: Secondary | ICD-10-CM | POA: Diagnosis not present

## 2021-12-20 DIAGNOSIS — M1712 Unilateral primary osteoarthritis, left knee: Secondary | ICD-10-CM | POA: Diagnosis not present

## 2021-12-20 DIAGNOSIS — E785 Hyperlipidemia, unspecified: Secondary | ICD-10-CM | POA: Diagnosis not present

## 2021-12-20 DIAGNOSIS — G4730 Sleep apnea, unspecified: Secondary | ICD-10-CM | POA: Diagnosis not present

## 2021-12-20 DIAGNOSIS — Z86711 Personal history of pulmonary embolism: Secondary | ICD-10-CM | POA: Diagnosis not present

## 2021-12-20 DIAGNOSIS — L03115 Cellulitis of right lower limb: Secondary | ICD-10-CM | POA: Diagnosis not present

## 2021-12-20 DIAGNOSIS — M545 Low back pain, unspecified: Secondary | ICD-10-CM | POA: Diagnosis not present

## 2021-12-20 LAB — BASIC METABOLIC PANEL
BUN: 31 mg/dL — ABNORMAL HIGH (ref 6–23)
CO2: 25 mEq/L (ref 19–32)
Calcium: 8.5 mg/dL (ref 8.4–10.5)
Chloride: 103 mEq/L (ref 96–112)
Creatinine, Ser: 1.84 mg/dL — ABNORMAL HIGH (ref 0.40–1.50)
GFR: 37.45 mL/min — ABNORMAL LOW (ref >60.00)
Glucose, Bld: 160 mg/dL — ABNORMAL HIGH (ref 70–99)
Potassium: 4.8 mEq/L (ref 3.5–5.1)
Sodium: 135 mEq/L (ref 135–145)

## 2021-12-24 ENCOUNTER — Other Ambulatory Visit: Payer: Self-pay | Admitting: Family Medicine

## 2021-12-24 DIAGNOSIS — I10 Essential (primary) hypertension: Secondary | ICD-10-CM

## 2021-12-25 DIAGNOSIS — Z794 Long term (current) use of insulin: Secondary | ICD-10-CM | POA: Diagnosis not present

## 2021-12-25 DIAGNOSIS — N1832 Chronic kidney disease, stage 3b: Secondary | ICD-10-CM | POA: Diagnosis not present

## 2021-12-25 DIAGNOSIS — L03115 Cellulitis of right lower limb: Secondary | ICD-10-CM | POA: Diagnosis not present

## 2021-12-25 DIAGNOSIS — Z7985 Long-term (current) use of injectable non-insulin antidiabetic drugs: Secondary | ICD-10-CM | POA: Diagnosis not present

## 2021-12-25 DIAGNOSIS — Z7901 Long term (current) use of anticoagulants: Secondary | ICD-10-CM | POA: Diagnosis not present

## 2021-12-25 DIAGNOSIS — G4730 Sleep apnea, unspecified: Secondary | ICD-10-CM | POA: Diagnosis not present

## 2021-12-25 DIAGNOSIS — I129 Hypertensive chronic kidney disease with stage 1 through stage 4 chronic kidney disease, or unspecified chronic kidney disease: Secondary | ICD-10-CM | POA: Diagnosis not present

## 2021-12-25 DIAGNOSIS — Z86718 Personal history of other venous thrombosis and embolism: Secondary | ICD-10-CM | POA: Diagnosis not present

## 2021-12-25 DIAGNOSIS — N179 Acute kidney failure, unspecified: Secondary | ICD-10-CM | POA: Diagnosis not present

## 2021-12-25 DIAGNOSIS — M545 Low back pain, unspecified: Secondary | ICD-10-CM | POA: Diagnosis not present

## 2021-12-25 DIAGNOSIS — Z86711 Personal history of pulmonary embolism: Secondary | ICD-10-CM | POA: Diagnosis not present

## 2021-12-25 DIAGNOSIS — M1712 Unilateral primary osteoarthritis, left knee: Secondary | ICD-10-CM | POA: Diagnosis not present

## 2021-12-25 DIAGNOSIS — E785 Hyperlipidemia, unspecified: Secondary | ICD-10-CM | POA: Diagnosis not present

## 2021-12-25 DIAGNOSIS — E1122 Type 2 diabetes mellitus with diabetic chronic kidney disease: Secondary | ICD-10-CM | POA: Diagnosis not present

## 2021-12-27 ENCOUNTER — Other Ambulatory Visit: Payer: Self-pay

## 2021-12-27 DIAGNOSIS — Z7985 Long-term (current) use of injectable non-insulin antidiabetic drugs: Secondary | ICD-10-CM | POA: Diagnosis not present

## 2021-12-27 DIAGNOSIS — E1122 Type 2 diabetes mellitus with diabetic chronic kidney disease: Secondary | ICD-10-CM | POA: Diagnosis not present

## 2021-12-27 DIAGNOSIS — N1832 Chronic kidney disease, stage 3b: Secondary | ICD-10-CM | POA: Diagnosis not present

## 2021-12-27 DIAGNOSIS — L03115 Cellulitis of right lower limb: Secondary | ICD-10-CM | POA: Diagnosis not present

## 2021-12-27 DIAGNOSIS — Z86711 Personal history of pulmonary embolism: Secondary | ICD-10-CM | POA: Diagnosis not present

## 2021-12-27 DIAGNOSIS — Z7901 Long term (current) use of anticoagulants: Secondary | ICD-10-CM | POA: Diagnosis not present

## 2021-12-27 DIAGNOSIS — Z86718 Personal history of other venous thrombosis and embolism: Secondary | ICD-10-CM | POA: Diagnosis not present

## 2021-12-27 DIAGNOSIS — E785 Hyperlipidemia, unspecified: Secondary | ICD-10-CM | POA: Diagnosis not present

## 2021-12-27 DIAGNOSIS — I129 Hypertensive chronic kidney disease with stage 1 through stage 4 chronic kidney disease, or unspecified chronic kidney disease: Secondary | ICD-10-CM | POA: Diagnosis not present

## 2021-12-27 DIAGNOSIS — M545 Low back pain, unspecified: Secondary | ICD-10-CM | POA: Diagnosis not present

## 2021-12-27 DIAGNOSIS — M1712 Unilateral primary osteoarthritis, left knee: Secondary | ICD-10-CM | POA: Diagnosis not present

## 2021-12-27 DIAGNOSIS — Z794 Long term (current) use of insulin: Secondary | ICD-10-CM | POA: Diagnosis not present

## 2021-12-27 DIAGNOSIS — G4730 Sleep apnea, unspecified: Secondary | ICD-10-CM | POA: Diagnosis not present

## 2021-12-27 DIAGNOSIS — N179 Acute kidney failure, unspecified: Secondary | ICD-10-CM | POA: Diagnosis not present

## 2021-12-28 ENCOUNTER — Ambulatory Visit (INDEPENDENT_AMBULATORY_CARE_PROVIDER_SITE_OTHER): Payer: Medicare Other | Admitting: Family Medicine

## 2021-12-28 ENCOUNTER — Encounter: Payer: Self-pay | Admitting: Family Medicine

## 2021-12-28 ENCOUNTER — Encounter: Payer: Self-pay | Admitting: Internal Medicine

## 2021-12-28 VITALS — BP 132/68 | HR 91 | Temp 97.4°F | Ht 73.0 in | Wt 231.4 lb

## 2021-12-28 DIAGNOSIS — M1712 Unilateral primary osteoarthritis, left knee: Secondary | ICD-10-CM | POA: Diagnosis not present

## 2021-12-28 DIAGNOSIS — E785 Hyperlipidemia, unspecified: Secondary | ICD-10-CM | POA: Diagnosis not present

## 2021-12-28 DIAGNOSIS — Z794 Long term (current) use of insulin: Secondary | ICD-10-CM | POA: Diagnosis not present

## 2021-12-28 DIAGNOSIS — Z1211 Encounter for screening for malignant neoplasm of colon: Secondary | ICD-10-CM

## 2021-12-28 DIAGNOSIS — N1832 Chronic kidney disease, stage 3b: Secondary | ICD-10-CM | POA: Diagnosis not present

## 2021-12-28 DIAGNOSIS — L03115 Cellulitis of right lower limb: Secondary | ICD-10-CM | POA: Diagnosis not present

## 2021-12-28 DIAGNOSIS — N179 Acute kidney failure, unspecified: Secondary | ICD-10-CM | POA: Diagnosis not present

## 2021-12-28 DIAGNOSIS — Z86718 Personal history of other venous thrombosis and embolism: Secondary | ICD-10-CM | POA: Diagnosis not present

## 2021-12-28 DIAGNOSIS — Z125 Encounter for screening for malignant neoplasm of prostate: Secondary | ICD-10-CM

## 2021-12-28 DIAGNOSIS — I739 Peripheral vascular disease, unspecified: Secondary | ICD-10-CM

## 2021-12-28 DIAGNOSIS — Z7985 Long-term (current) use of injectable non-insulin antidiabetic drugs: Secondary | ICD-10-CM | POA: Diagnosis not present

## 2021-12-28 DIAGNOSIS — E1122 Type 2 diabetes mellitus with diabetic chronic kidney disease: Secondary | ICD-10-CM | POA: Diagnosis not present

## 2021-12-28 DIAGNOSIS — G4730 Sleep apnea, unspecified: Secondary | ICD-10-CM | POA: Diagnosis not present

## 2021-12-28 DIAGNOSIS — I129 Hypertensive chronic kidney disease with stage 1 through stage 4 chronic kidney disease, or unspecified chronic kidney disease: Secondary | ICD-10-CM | POA: Diagnosis not present

## 2021-12-28 DIAGNOSIS — Z23 Encounter for immunization: Secondary | ICD-10-CM | POA: Diagnosis not present

## 2021-12-28 DIAGNOSIS — E782 Mixed hyperlipidemia: Secondary | ICD-10-CM

## 2021-12-28 DIAGNOSIS — Z7901 Long term (current) use of anticoagulants: Secondary | ICD-10-CM | POA: Diagnosis not present

## 2021-12-28 DIAGNOSIS — M545 Low back pain, unspecified: Secondary | ICD-10-CM | POA: Diagnosis not present

## 2021-12-28 DIAGNOSIS — Z86711 Personal history of pulmonary embolism: Secondary | ICD-10-CM | POA: Diagnosis not present

## 2021-12-28 MED ORDER — CEFADROXIL 500 MG PO CAPS: ORAL_CAPSULE | ORAL | 0 refills | Status: DC

## 2021-12-28 NOTE — Progress Notes (Signed)
Established Patient Office Visit  Subjective:  Patient ID: [name], male    DOB: 02/04/1956  Age: 66 y.o. MRN: [id number]  CC:  Chief Complaint  Patient presents with   Follow-up    Follow up on cellulitis per patient some improvement. No concerns.     HPI [name] presents for follow-up of right limb cellulitis.  Visiting home health has been changing his dressing 3 times weekly.  Will be due for his next dressing change to.  Slowly improving from the foot up.  There is less drainage.  Denies fever.  There is some ongoing erythema in the medial calf.  Diabetes seems to be well controlled with last hemoglobin A1c at 6.4.  ABIs in 2021 showed noncompressible arteries on the right side with decreased TBI.  Past Medical History:  Diagnosis Date   Arthritis    Chronic kidney disease    reduced kidney function. Stage III   Diabetes mellitus without complication (HCC)    DVT (deep venous thrombosis) [facility])    age indeterminate LLE DVT 02/09/20   Dyspnea    On exertion   History of kidney stones    History of pulmonary embolus (PE)    Hypertension    Lipidemia    Prostate enlargement    Pulmonary embolism ([place]) 2009   Sleep apnea     Past Surgical History:  Procedure Laterality Date   CYSTOSCOPY W/ URETERAL STENT PLACEMENT Left 10/08/2020   Procedure: CYSTOSCOPY WITH RETROGRADE PYELOGRAM/URETERAL STENT PLACEMENT;  Surgeon: Irine Seal, MD;  Location: [facility];  Service: Urology;  Laterality: Left;   CYSTOSCOPY/URETEROSCOPY/HOLMIUM LASER/STENT PLACEMENT Right 06/07/2020   Procedure: CYSTOSCOPY/URETEROSCOPY/HOLMIUM LASER/STENT PLACEMENT;  Surgeon: Lucas Mallow, MD;  [facility];  Service: Urology;  Laterality: Right;   CYSTOSCOPY/URETEROSCOPY/HOLMIUM LASER/STENT PLACEMENT Left 10/19/2020   Procedure: CYSTOSCOPY LEFT URETEROSCOPY/HOLMIUM LASER/STENT EXCHANGE;  Surgeon: Irine Seal, MD;  [facility];  Service: Urology;  Laterality: Left;   TOTAL KNEE ARTHROPLASTY Left  08/06/2021   Procedure: LEFT TOTAL KNEE ARTHROPLASTY;  Surgeon: Leandrew Koyanagi, MD;  Location: [facility];  Service: Orthopedics;  Laterality: Left;    Family History  Problem Relation Age of Onset   Cancer Mother    Heart disease Father     Social History   Socioeconomic History   Marital status: Significant Other    Spouse name: Not on file   Number of children: Not on file   Years of education: Not on file   Highest education level: Not on file  Occupational History   Occupation: retired  Tobacco Use   Smoking status: Never   Smokeless tobacco: Never  Vaping Use   Vaping Use: Never used  Substance and Sexual Activity   Alcohol use: Yes    Alcohol/week: 1.0 standard drink    Types: 1 Cans of beer per week    Comment: social   Drug use: Never   Sexual activity: Yes  Other Topics Concern   Not on file  Social History Narrative   Lives with Friend   Right handed   Drinks 1-2 cups caffeine daily   Social Determinants of Health   Financial Resource Strain: Not on file  Food Insecurity: Not on file  Transportation Needs: Not on file  Physical Activity: Not on file  Stress: Not on file  Social Connections: Not on file  Intimate Partner Violence: Not on file    Outpatient Medications Prior to Visit  Medication Sig Dispense Refill   ACCU-CHEK  GUIDE test strip USE TO CHECK BLOOD SUGAR 3 TIMES DAILY.E11.65 100 strip 12   Accu-Chek Softclix Lancets lancets Use as instructed to check blood sugar 3 times daily Dx is E11.65 100 each 2   BD INSULIN SYRINGE U/F 31G X 5/16" 1 ML MISC USE AS DIRECTED 100 each 11   Blood Glucose Monitoring Suppl (ACCU-CHEK AVIVA) device by Other route. Use as instructed     diclofenac Sodium (VOLTAREN) 1 % GEL Apply 1 application topically 4 (four) times daily as needed (pain).     Dulaglutide (TRULICITY) 1.5 AT/5.5DD SOPN Inject 1.5 mg into the skin once a week. (Patient taking differently: Inject 1.5 mg into the skin once a week. Sundays) 6 mL 3    furosemide (LASIX) 20 MG tablet Take 1 tablet (20 mg total) by mouth daily. 7 tablet 0   gabapentin (NEURONTIN) 300 MG capsule Take 2 capsules (600 mg total) by mouth 2 (two) times daily. 120 capsule 3   insulin NPH-regular Human (HUMULIN 70/30) (70-30) 100 UNIT/ML injection Inject 25 Units into the skin 2 (two) times daily with a meal. Max daily up to 90 mL 90 mL 3   Insulin Pen Needle 32G X 4 MM MISC 1 Device by Does not apply route in the morning and at bedtime. 200 each 3   Insulin Syringes, Disposable, U-100 1 ML MISC 1 Device by Does not apply route as directed. 100 each 11   lovastatin (MEVACOR) 40 MG tablet Take 1 tablet (40 mg total) by mouth at bedtime. 90 tablet 1   methocarbamol (ROBAXIN) 500 MG tablet Take 1 tablet (500 mg total) by mouth 2 (two) times daily as needed. To be taken after surgery (Patient taking differently: Take 500 mg by mouth 2 (two) times daily as needed for muscle spasms. To be taken after surgery) 20 tablet 2   montelukast (SINGULAIR) 10 MG tablet Take 1 tablet (10 mg total) by mouth at bedtime. 90 tablet 1   sodium bicarbonate 650 MG tablet Take 1 tablet (650 mg total) by mouth 2 (two) times daily. 30 tablet 0   warfarin (COUMADIN) 4 MG tablet Take 1 tablet (4 mg total) by mouth daily. 30 tablet 0   cefadroxil (DURICEF) 500 MG capsule Take one daily 10 capsule 0   No facility-administered medications prior to visit.    No Known Allergies  ROS Review of Systems  Constitutional:  Negative for diaphoresis, fatigue, fever and unexpected weight change.  HENT: Negative.    Respiratory: Negative.    Cardiovascular: Negative.   Gastrointestinal: Negative.   Genitourinary:  Negative for difficulty urinating, frequency and urgency.  Musculoskeletal:  Positive for gait problem.  Skin:  Positive for color change and wound.  Neurological:  Negative for speech difficulty, weakness and light-headedness. HA stump wound culture organism today but I have ordered some blood  work and just remind him he really needs to come back fasting done this before and he sometimes forgets to come back for yeah    Objective:    Physical Exam Vitals and nursing note reviewed.  Constitutional:      Appearance: Normal appearance.  HENT:     Head: Normocephalic and atraumatic.     Right Ear: External ear normal.     Left Ear: External ear normal.  Eyes:     General: No scleral icterus.       Right eye: No discharge.        Left eye: No discharge.  Extraocular Movements: Extraocular movements intact.     Conjunctiva/sclera: Conjunctivae normal.  Cardiovascular:     Pulses:          Dorsalis pedis pulses are 0 on the right side.       Posterior tibial pulses are 0 on the right side.     Comments: Capillary refill of the toe is less than 3 seconds. Pulmonary:     Effort: Pulmonary effort is normal.  Skin:      Neurological:     Mental Status: He is alert and oriented to person, place, and time.  Psychiatric:        Mood and Affect: Mood normal.        Behavior: Behavior normal.    BP 132/68 (BP Location: Right Arm, Patient Position: Sitting, Cuff Size: Large)    Pulse 91    Temp (!) 97.4 F (36.3 C) (Temporal)    Ht 6\' 1"  (1.854 m)    Wt 231 lb 6.4 oz (105 kg)    SpO2 98%    BMI 30.53 kg/m  Wt Readings from Last 3 Encounters:  12/28/21 231 lb 6.4 oz (105 kg)  12/19/21 241 lb 6.4 oz (109.5 kg)  12/10/21 232 lb 2.3 oz (105.3 kg)     Health Maintenance Due  Topic Date Due   Hepatitis C Screening  Never done   FOOT EXAM  02/22/2021   INFLUENZA VACCINE  06/04/2021   Pneumonia Vaccine 87+ Years old (2 - PCV) 06/08/2021   URINE MICROALBUMIN  08/28/2021    There are no preventive care reminders to display for this patient.  Lab Results  Component Value Date   TSH 1.344 02/09/2020   Lab Results  Component Value Date   WBC 9.7 12/19/2021   HGB 11.0 (L) 12/19/2021   HCT 33.6 (L) 12/19/2021   MCV 91.9 12/19/2021   PLT 338.0 12/19/2021   Lab  Results  Component Value Date   NA 135 12/19/2021   K 4.8 12/19/2021   CO2 25 12/19/2021   GLUCOSE 160 (H) 12/19/2021   BUN 31 (H) 12/19/2021   CREATININE 1.84 (H) 12/19/2021   BILITOT 0.7 12/06/2021   ALKPHOS 50 12/06/2021   AST 21 12/06/2021   ALT 21 12/06/2021   PROT 6.9 12/06/2021   ALBUMIN 2.7 (L) 12/09/2021   CALCIUM 8.5 12/19/2021   ANIONGAP 7 12/13/2021   GFR 37.45 (L) 12/19/2021   Lab Results  Component Value Date   CHOL 156 12/22/2019   Lab Results  Component Value Date   HDL 41.40 12/22/2019   No results found for: [facility] Lab Results  Component Value Date   TRIG 251.0 (H) 12/22/2019   Lab Results  Component Value Date   CHOLHDL 4 12/22/2019   Lab Results  Component Value Date   HGBA1C 6.4 (H) 12/06/2021      Assessment & Plan:   Problem List Items Addressed This Visit       Cardiovascular and Mediastinum   PVD (peripheral vascular disease) ([place])   Relevant Orders   Ambulatory referral to Vascular Surgery     Other   Encounter for screening colonoscopy - Primary   Relevant Orders   Ambulatory referral to Gastroenterology   Cellulitis of right lower extremity   Relevant Medications   cefadroxil (DURICEF) 500 MG capsule   Other Relevant Orders   Wound culture   Need for pneumococcal vaccination   Relevant Orders   Pneumococcal conjugate vaccine 20-valent (Prevnar 20)   Need for  influenza vaccination   Relevant Orders   Flu Vaccine QUAD High Dose(Fluad)   Other Visit Diagnoses     Screening for prostate cancer       Relevant Orders   PSA   Mixed hyperlipidemia       Relevant Orders   Lipid panel   LDL cholesterol, direct       Meds ordered this encounter  Medications   cefadroxil (DURICEF) 500 MG capsule    Sig: Take one daily    Dispense:  10 capsule    Refill:  0    Follow-up: Return in about 10 days (around 01/07/2022).  Continue with Duricef.  Continue with visiting home health.  Vascular referral with question of  arterial compromise.  Will return fasting for above ordered blood work.  Wound culture sent today.  [name], MD

## 2021-12-31 DIAGNOSIS — G4730 Sleep apnea, unspecified: Secondary | ICD-10-CM | POA: Diagnosis not present

## 2021-12-31 DIAGNOSIS — Z794 Long term (current) use of insulin: Secondary | ICD-10-CM | POA: Diagnosis not present

## 2021-12-31 DIAGNOSIS — Z86711 Personal history of pulmonary embolism: Secondary | ICD-10-CM | POA: Diagnosis not present

## 2021-12-31 DIAGNOSIS — Z86718 Personal history of other venous thrombosis and embolism: Secondary | ICD-10-CM | POA: Diagnosis not present

## 2021-12-31 DIAGNOSIS — N179 Acute kidney failure, unspecified: Secondary | ICD-10-CM | POA: Diagnosis not present

## 2021-12-31 DIAGNOSIS — Z7901 Long term (current) use of anticoagulants: Secondary | ICD-10-CM | POA: Diagnosis not present

## 2021-12-31 DIAGNOSIS — E785 Hyperlipidemia, unspecified: Secondary | ICD-10-CM | POA: Diagnosis not present

## 2021-12-31 DIAGNOSIS — N1832 Chronic kidney disease, stage 3b: Secondary | ICD-10-CM | POA: Diagnosis not present

## 2021-12-31 DIAGNOSIS — Z7985 Long-term (current) use of injectable non-insulin antidiabetic drugs: Secondary | ICD-10-CM | POA: Diagnosis not present

## 2021-12-31 DIAGNOSIS — I129 Hypertensive chronic kidney disease with stage 1 through stage 4 chronic kidney disease, or unspecified chronic kidney disease: Secondary | ICD-10-CM | POA: Diagnosis not present

## 2021-12-31 DIAGNOSIS — M1712 Unilateral primary osteoarthritis, left knee: Secondary | ICD-10-CM | POA: Diagnosis not present

## 2021-12-31 DIAGNOSIS — L03115 Cellulitis of right lower limb: Secondary | ICD-10-CM | POA: Diagnosis not present

## 2021-12-31 DIAGNOSIS — E1122 Type 2 diabetes mellitus with diabetic chronic kidney disease: Secondary | ICD-10-CM | POA: Diagnosis not present

## 2021-12-31 DIAGNOSIS — M545 Low back pain, unspecified: Secondary | ICD-10-CM | POA: Diagnosis not present

## 2021-12-31 LAB — WOUND CULTURE
MICRO NUMBER:: 13054669
SPECIMEN QUALITY:: ADEQUATE

## 2021-12-31 MED ORDER — SULFAMETHOXAZOLE-TRIMETHOPRIM 800-160 MG PO TABS: 1.0000 | ORAL_TABLET | Freq: Two times a day (BID) | ORAL | 0 refills | Status: AC

## 2021-12-31 NOTE — Addendum Note (Signed)
Addended by: Jon Billings on: 12/31/2021 12:09 PM   Modules accepted: Orders

## 2021-12-31 NOTE — Telephone Encounter (Signed)
Pt came by and picked up one box of Trulicity.

## 2022-01-01 ENCOUNTER — Other Ambulatory Visit: Payer: Self-pay

## 2022-01-01 ENCOUNTER — Other Ambulatory Visit (INDEPENDENT_AMBULATORY_CARE_PROVIDER_SITE_OTHER): Payer: Medicare Other

## 2022-01-01 DIAGNOSIS — Z125 Encounter for screening for malignant neoplasm of prostate: Secondary | ICD-10-CM

## 2022-01-01 DIAGNOSIS — R600 Localized edema: Secondary | ICD-10-CM

## 2022-01-01 DIAGNOSIS — E782 Mixed hyperlipidemia: Secondary | ICD-10-CM | POA: Diagnosis not present

## 2022-01-01 LAB — LDL CHOLESTEROL, DIRECT: Direct LDL: 52 mg/dL

## 2022-01-01 LAB — LIPID PANEL
Cholesterol: 112 mg/dL (ref 0–200)
HDL: 34.5 mg/dL — ABNORMAL LOW (ref >39.00)
LDL Cholesterol: 55 mg/dL (ref 0–99)
NonHDL: 77.51
Total CHOL/HDL Ratio: 3
Triglycerides: 114 mg/dL (ref 0.0–149.0)
VLDL: 22.8 mg/dL (ref 0.0–40.0)

## 2022-01-01 LAB — PSA: PSA: 2.45 ng/mL (ref 0.10–4.00)

## 2022-01-02 ENCOUNTER — Ambulatory Visit: Payer: Medicare Other

## 2022-01-02 ENCOUNTER — Telehealth: Payer: Self-pay

## 2022-01-02 DIAGNOSIS — Z86718 Personal history of other venous thrombosis and embolism: Secondary | ICD-10-CM | POA: Diagnosis not present

## 2022-01-02 DIAGNOSIS — N1832 Chronic kidney disease, stage 3b: Secondary | ICD-10-CM | POA: Diagnosis not present

## 2022-01-02 DIAGNOSIS — E1122 Type 2 diabetes mellitus with diabetic chronic kidney disease: Secondary | ICD-10-CM | POA: Diagnosis not present

## 2022-01-02 DIAGNOSIS — G4730 Sleep apnea, unspecified: Secondary | ICD-10-CM | POA: Diagnosis not present

## 2022-01-02 DIAGNOSIS — I129 Hypertensive chronic kidney disease with stage 1 through stage 4 chronic kidney disease, or unspecified chronic kidney disease: Secondary | ICD-10-CM | POA: Diagnosis not present

## 2022-01-02 DIAGNOSIS — E785 Hyperlipidemia, unspecified: Secondary | ICD-10-CM | POA: Diagnosis not present

## 2022-01-02 DIAGNOSIS — Z86711 Personal history of pulmonary embolism: Secondary | ICD-10-CM | POA: Diagnosis not present

## 2022-01-02 DIAGNOSIS — N179 Acute kidney failure, unspecified: Secondary | ICD-10-CM | POA: Diagnosis not present

## 2022-01-02 DIAGNOSIS — Z7901 Long term (current) use of anticoagulants: Secondary | ICD-10-CM | POA: Diagnosis not present

## 2022-01-02 DIAGNOSIS — L03115 Cellulitis of right lower limb: Secondary | ICD-10-CM | POA: Diagnosis not present

## 2022-01-02 DIAGNOSIS — M545 Low back pain, unspecified: Secondary | ICD-10-CM | POA: Diagnosis not present

## 2022-01-02 DIAGNOSIS — Z794 Long term (current) use of insulin: Secondary | ICD-10-CM | POA: Diagnosis not present

## 2022-01-02 DIAGNOSIS — M1712 Unilateral primary osteoarthritis, left knee: Secondary | ICD-10-CM | POA: Diagnosis not present

## 2022-01-02 DIAGNOSIS — Z7985 Long-term (current) use of injectable non-insulin antidiabetic drugs: Secondary | ICD-10-CM | POA: Diagnosis not present

## 2022-01-02 NOTE — Telephone Encounter (Signed)
Pt missed apt today in coumadin clinic. ?LVM for pt to call to RS. ?

## 2022-01-03 NOTE — Telephone Encounter (Signed)
Pt reports he could not make apt yesterday due to a nurse visiting. He also reports he has been placed on Bactrim and has also only been taking 4 mg daily due to that is what the hospital told him to take the last time he was d/c. ?Advised pt the abx has a major interaction with warfarin. Advised only take 2 mg daily until his apt on 3/6. Advised pt to watch for signs of bleeding and to contact office or go to the ER if any bleeding. Pt verbalized understanding.  ?

## 2022-01-04 ENCOUNTER — Telehealth: Payer: Self-pay | Admitting: Family Medicine

## 2022-01-04 DIAGNOSIS — M545 Low back pain, unspecified: Secondary | ICD-10-CM | POA: Diagnosis not present

## 2022-01-04 DIAGNOSIS — Z794 Long term (current) use of insulin: Secondary | ICD-10-CM | POA: Diagnosis not present

## 2022-01-04 DIAGNOSIS — N179 Acute kidney failure, unspecified: Secondary | ICD-10-CM | POA: Diagnosis not present

## 2022-01-04 DIAGNOSIS — G4730 Sleep apnea, unspecified: Secondary | ICD-10-CM | POA: Diagnosis not present

## 2022-01-04 DIAGNOSIS — Z86711 Personal history of pulmonary embolism: Secondary | ICD-10-CM | POA: Diagnosis not present

## 2022-01-04 DIAGNOSIS — Z7985 Long-term (current) use of injectable non-insulin antidiabetic drugs: Secondary | ICD-10-CM | POA: Diagnosis not present

## 2022-01-04 DIAGNOSIS — L03115 Cellulitis of right lower limb: Secondary | ICD-10-CM | POA: Diagnosis not present

## 2022-01-04 DIAGNOSIS — Z86718 Personal history of other venous thrombosis and embolism: Secondary | ICD-10-CM | POA: Diagnosis not present

## 2022-01-04 DIAGNOSIS — E1122 Type 2 diabetes mellitus with diabetic chronic kidney disease: Secondary | ICD-10-CM | POA: Diagnosis not present

## 2022-01-04 DIAGNOSIS — E785 Hyperlipidemia, unspecified: Secondary | ICD-10-CM | POA: Diagnosis not present

## 2022-01-04 DIAGNOSIS — Z7901 Long term (current) use of anticoagulants: Secondary | ICD-10-CM | POA: Diagnosis not present

## 2022-01-04 DIAGNOSIS — M1712 Unilateral primary osteoarthritis, left knee: Secondary | ICD-10-CM | POA: Diagnosis not present

## 2022-01-04 DIAGNOSIS — I129 Hypertensive chronic kidney disease with stage 1 through stage 4 chronic kidney disease, or unspecified chronic kidney disease: Secondary | ICD-10-CM | POA: Diagnosis not present

## 2022-01-04 DIAGNOSIS — N1832 Chronic kidney disease, stage 3b: Secondary | ICD-10-CM | POA: Diagnosis not present

## 2022-01-04 NOTE — Telephone Encounter (Signed)
Leigh from [facility] is needing verbal orders for---Wound care to right lower leg  cleanse with wound cleanser and pat dry. Apply calcium alginate silver. Cover with foam cover with ABD for excessive wound drainage. Apply Unna boot from [employer] of knee to toes. Secure with Kerlix and coban. SN to change dressing three times weekly and as needed. Please advise Leigh at [phone]. ?

## 2022-01-07 ENCOUNTER — Ambulatory Visit: Payer: Medicare Other

## 2022-01-07 DIAGNOSIS — N1832 Chronic kidney disease, stage 3b: Secondary | ICD-10-CM | POA: Diagnosis not present

## 2022-01-07 DIAGNOSIS — M1712 Unilateral primary osteoarthritis, left knee: Secondary | ICD-10-CM | POA: Diagnosis not present

## 2022-01-07 DIAGNOSIS — M545 Low back pain, unspecified: Secondary | ICD-10-CM | POA: Diagnosis not present

## 2022-01-07 DIAGNOSIS — N179 Acute kidney failure, unspecified: Secondary | ICD-10-CM | POA: Diagnosis not present

## 2022-01-07 DIAGNOSIS — G4730 Sleep apnea, unspecified: Secondary | ICD-10-CM | POA: Diagnosis not present

## 2022-01-07 DIAGNOSIS — L03115 Cellulitis of right lower limb: Secondary | ICD-10-CM | POA: Diagnosis not present

## 2022-01-07 DIAGNOSIS — Z794 Long term (current) use of insulin: Secondary | ICD-10-CM | POA: Diagnosis not present

## 2022-01-07 DIAGNOSIS — Z86711 Personal history of pulmonary embolism: Secondary | ICD-10-CM | POA: Diagnosis not present

## 2022-01-07 DIAGNOSIS — Z86718 Personal history of other venous thrombosis and embolism: Secondary | ICD-10-CM | POA: Diagnosis not present

## 2022-01-07 DIAGNOSIS — Z7901 Long term (current) use of anticoagulants: Secondary | ICD-10-CM | POA: Diagnosis not present

## 2022-01-07 DIAGNOSIS — I129 Hypertensive chronic kidney disease with stage 1 through stage 4 chronic kidney disease, or unspecified chronic kidney disease: Secondary | ICD-10-CM | POA: Diagnosis not present

## 2022-01-07 DIAGNOSIS — Z7985 Long-term (current) use of injectable non-insulin antidiabetic drugs: Secondary | ICD-10-CM | POA: Diagnosis not present

## 2022-01-07 DIAGNOSIS — E785 Hyperlipidemia, unspecified: Secondary | ICD-10-CM | POA: Diagnosis not present

## 2022-01-07 DIAGNOSIS — E1122 Type 2 diabetes mellitus with diabetic chronic kidney disease: Secondary | ICD-10-CM | POA: Diagnosis not present

## 2022-01-07 NOTE — Telephone Encounter (Signed)
[name] is checking on status of this message. [name] has noticed his wound is not healing up and every time she goes over there his bandages are off. Please contact Leigh for the Verbal order.  ?

## 2022-01-07 NOTE — Telephone Encounter (Signed)
Returned [employer] call no answer LMTCB.  ?

## 2022-01-08 DIAGNOSIS — L03115 Cellulitis of right lower limb: Secondary | ICD-10-CM | POA: Diagnosis not present

## 2022-01-08 NOTE — Telephone Encounter (Signed)
LMTCB

## 2022-01-08 NOTE — Progress Notes (Addendum)
Glass, [name] ([id number]) Visit Report for 01/09/2022 Allergy List Details Patient Name: Date of Service: [name], [name] 01/09/2022 9:00 A M Medical Record Number: [id number] Patient Account Number: [id number] Date of Birth/Sex: Treating RN: 07-20-1956 (66 y.o. M) Primary Care Ozelle Brubacher: Abelino Derrick Other Clinician: Referring Broderick Fonseca: Treating Claris Guymon/Extender: Hedda Slade in Treatment: 0 Allergies Active Allergies No Known Allergies Allergy Notes Electronic Signature(s) Signed: 01/08/2022 4:32:21 PM By: Donavan Burnet CHT EMT BS , , Entered By: Donavan Burnet on 01/08/2022 16:32:21 -------------------------------------------------------------------------------- Arrival Information Details Patient Name: Date of Service: [name], Glass 01/09/2022 9:00 [place] Record Number: [id number] Patient Account Number: [id number] Date of Birth/Sex: Treating RN: 1956-07-13 (66 y.o. Hessie Diener Primary Care Jaiah Weigel: Abelino Derrick Other Clinician: Referring Shiya Fogelman: Treating Arraya Buck/Extender: Hedda Slade in Treatment: 0 Visit Information Patient Arrived: Lyndel Pleasure Time: 08:50 Accompanied By: self Transfer Assistance: None Patient Identification Verified: Yes Secondary Verification Process Completed: Yes Patient Requires Transmission-Based Precautions: No Patient Has Alerts: Yes Patient Alerts: Patient on Blood Thinner Electronic Signature(s) Signed: 01/09/2022 4:44:26 PM By: Deon Pilling RN, BSN Entered By: Deon Pilling on 01/09/2022 08:53:17 -------------------------------------------------------------------------------- Clinic Level of Care Assessment Details Patient Name: Date of Service: [name], Glass 01/09/2022 9:00 [place] Record Number: [id number] Patient Account Number: [id number] Date of Birth/Sex: Treating RN: Feb 13, 1956 (66 y.o. Hessie Diener Primary Care Montgomery Rothlisberger: Abelino Derrick Other  Clinician: Referring Chad Donoghue: Treating Malaiah Viramontes/Extender: Hedda Slade in Treatment: 0 Clinic Level of Care Assessment Items TOOL 1 Quantity Score X- 1 0 Use when EandM and Procedure is performed on INITIAL visit ASSESSMENTS - Nursing Assessment / Reassessment X- 1 20 General Physical Exam (combine w/ comprehensive assessment (listed just below) when performed on new pt. evals) X- 1 25 Comprehensive Assessment (HX, ROS, Risk Assessments, Wounds Hx, etc.) ASSESSMENTS - Wound and Skin Assessment / Reassessment X- 1 10 Dermatologic / Skin Assessment (not related to wound area) ASSESSMENTS - Ostomy and/or Continence Assessment and Care []  - 0 Incontinence Assessment and Management []  - 0 Ostomy Care Assessment and Management (repouching, etc.) PROCESS - Coordination of Care X - Simple Patient / Family Education for ongoing care 1 15 []  - 0 Complex (extensive) Patient / Family Education for ongoing care X- 1 10 Staff obtains Programmer, systems, Records, T Results / Process Orders est X- 1 10 Staff telephones HHA, Nursing Homes / Clarify orders / etc []  - 0 Routine Transfer to another Facility (non-emergent condition) []  - 0 Routine Hospital Admission (non-emergent condition) X- 1 15 New Admissions / Biomedical engineer / Ordering NPWT Apligraf, etc. , []  - 0 Emergency Hospital Admission (emergent condition) PROCESS - Special Needs []  - 0 Pediatric / Minor Patient Management []  - 0 Isolation Patient Management []  - 0 Hearing / Language / Visual special needs []  - 0 Assessment of Community assistance (transportation, D/C planning, etc.) []  - 0 Additional assistance / Altered mentation []  - 0 Support Surface(s) Assessment (bed, cushion, seat, etc.) INTERVENTIONS - Miscellaneous []  - 0 External ear exam []  - 0 Patient Transfer (multiple staff / Civil Service fast streamer / Similar devices) []  - 0 Simple Staple / Suture removal (25 or less) []  - 0 Complex  Staple / Suture removal (26 or more) []  - 0 Hypo/Hyperglycemic Management (do not check if billed separately) X- 1 15 Ankle / Brachial Index (ABI) - do not check if billed separately Has the patient been seen at the hospital within the last three years: Yes Total Score: 120  Level Of Care: New/Established - Level 4 Electronic Signature(s) Signed: 01/09/2022 4:44:26 PM By: Deon Pilling RN, BSN Entered By: Deon Pilling on 01/09/2022 09:49:50 -------------------------------------------------------------------------------- Compression Therapy Details Patient Name: Date of Service: [name], Glass 01/09/2022 9:00 A M Medical Record Number: [id number] Patient Account Number: [id number] Date of Birth/Sex: Treating RN: Mar 13, 1956 (66 y.o. Hessie Diener Primary Care Saprina Chuong: Abelino Derrick Other Clinician: Referring Keiasia Christianson: Treating Shana Zavaleta/Extender: Hedda Slade in Treatment: 0 Compression Therapy Performed for Wound Assessment: Wound #1 Right,Circumferential Lower Leg Performed By: Clinician Deon Pilling, RN Compression Type: Three Layer Post Procedure Diagnosis Same as Pre-procedure Electronic Signature(s) Signed: 01/09/2022 4:44:26 PM By: Deon Pilling RN, BSN Entered By: Deon Pilling on 01/09/2022 09:49:28 -------------------------------------------------------------------------------- Encounter Discharge Information Details Patient Name: Date of Service: [name], Glass 01/09/2022 9:00 [name] Record Number: [id number] Patient Account Number: [id number] Date of Birth/Sex: Treating RN: June 04, 1956 (66 y.o. Hessie Diener Primary Care Cobi Delph: Abelino Derrick Other Clinician: Referring Aarit Kashuba: Treating Sheina Mcleish/Extender: Hedda Slade in Treatment: 0 Encounter Discharge Information Items Post Procedure Vitals Discharge Condition: Stable Temperature (F): 97.8 Ambulatory Status: Cane Pulse (bpm): 97 Discharge  Destination: Home Respiratory Rate (breaths/min): 18 Transportation: Private Auto Blood Pressure (mmHg): 178/79 Accompanied By: self Schedule Follow-up Appointment: Yes Clinical Summary of Care: Electronic Signature(s) Signed: 01/09/2022 4:44:26 PM By: Deon Pilling RN, BSN Entered By: Deon Pilling on 01/09/2022 09:51:04 -------------------------------------------------------------------------------- Lower Extremity Assessment Details Patient Name: Date of Service: TIELER, COURNOYER 01/09/2022 9:00 A M Medical Record Number: [id number] Patient Account Number: [id number] Date of Birth/Sex: Treating RN: [birth year] (66 y.o. Lorette Ang, Meta.Reding Primary Care Merrit Friesen: Abelino Derrick Other Clinician: Referring Nikola Marone: Treating Malekai Markwood/Extender: Hedda Slade in Treatment: 0 Edema Assessment Assessed: Shirlyn Goltz: No] Patrice Paradise: Yes] Edema: [Left: Ye] [Right: s] Calf Left: Right: Point of Measurement: 39 cm From Medial Instep 40.5 cm Ankle Left: Right: Point of Measurement: 15 cm From Medial Instep 26 cm Knee To Floor Left: Right: From Medial Instep 49 cm Vascular Assessment Pulses: Dorsalis Pedis Palpable: [Right:Yes] Doppler Audible: [Right:Yes] Posterior Tibial Palpable: [Right:Yes Yes] Notes ABI: [place] right leg. Electronic Signature(s) Signed: 01/09/2022 4:44:26 PM By: Deon Pilling RN, BSN Entered By: Deon Pilling on 01/09/2022 09:15:16 -------------------------------------------------------------------------------- Multi-Disciplinary Care Plan Details Patient Name: Date of Service: ERIQ, HUFFORD 01/09/2022 9:00 A M Medical Record Number: [id number] Patient Account Number: [id number] Date of Birth/Sex: Treating RN: [birth year] (66 y.o. Lorette Ang, Meta.Reding Primary Care Tarin Johndrow: Abelino Derrick Other Clinician: Referring Jaquitta Dupriest: Treating Demaris Leavell/Extender: Hedda Slade in Treatment: 0 Active Inactive Nutrition Nursing  Diagnoses: Potential for alteratiion in Nutrition/Potential for imbalanced nutrition Goals: Patient/caregiver agrees to and verbalizes understanding of need to obtain nutritional consultation Date Initiated: 01/09/2022 T arget Resolution Date: 02/07/2022 Goal Status: Active Interventions: Assess HgA1c results as ordered upon admission and as needed Provide education on elevated blood sugars and impact on wound healing Provide education on nutrition Treatment Activities: Obtain HgA1c : 01/09/2022 Notes: Orientation to the Wound Care Program Nursing Diagnoses: Knowledge deficit related to the wound healing center program Goals: Patient/caregiver will verbalize understanding of the [place] Date Initiated: 01/09/2022 [place] Resolution Date: 02/08/2022 Goal Status: Active Interventions: Provide education on orientation to the wound center Notes: Pain, Acute or Chronic Nursing Diagnoses: Pain, acute or chronic: actual or potential Potential alteration in comfort, pain Goals: Patient will verbalize adequate pain control and receive pain control interventions during procedures as needed Date Initiated: 01/09/2022 [place] Resolution Date: 02/08/2022 Goal Status:  Active Patient/caregiver will verbalize comfort level met Date Initiated: 01/09/2022 [place] Resolution Date: 02/07/2022 Goal Status: Active Interventions: Encourage patient to take pain medications as prescribed Provide education on pain management Reposition patient for comfort Treatment Activities: Administer pain control measures as ordered : 01/09/2022 Notes: Wound/Skin Impairment Nursing Diagnoses: Knowledge deficit related to ulceration/compromised skin integrity Goals: Patient/caregiver will verbalize understanding of skin care regimen Date Initiated: 01/09/2022 [place] Resolution Date: 02/07/2022 Goal Status: Active Interventions: Assess patient/caregiver ability to perform ulcer/skin care regimen upon  admission and as needed Assess ulceration(s) every visit Provide education on ulcer and skin care Treatment Activities: Skin care regimen initiated : 01/09/2022 Topical wound management initiated : 01/09/2022 Notes: Electronic Signature(s) Signed: 01/09/2022 4:44:26 PM By: Deon Pilling RN, BSN Entered By: Deon Pilling on 01/09/2022 09:38:58 -------------------------------------------------------------------------------- Pain Assessment Details Patient Name: Date of Service: MURPHY, DUZAN 01/09/2022 9:00 [place] Record Number: [id number] Patient Account Number: [id number] Date of Birth/Sex: Treating RN: Feb 28, 1956 (66 y.o. Hessie Diener Primary Care Wendie Diskin: Abelino Derrick Other Clinician: Referring Briteny Fulghum: Treating Josselin Gaulin/Extender: Hedda Slade in Treatment: 0 Active Problems Location of Pain Severity and Description of Pain Patient Has Paino No Site Locations Rate the pain. Current Pain Level: 0 Pain Management and Medication Current Pain Management: Medication: No Cold Application: No Rest: No Massage: No Activity: No T.E.N.S.: No Heat Application: No Leg drop or elevation: No Is the Current Pain Management Adequate: Adequate How does your wound impact your activities of daily livingo Sleep: No Bathing: No Appetite: No Relationship With Others: No Bladder Continence: No Emotions: No Bowel Continence: No Work: No Toileting: No Drive: No Dressing: No Hobbies: No Engineer, maintenance) Signed: 01/09/2022 4:44:26 PM By: Deon Pilling RN, BSN Entered By: Deon Pilling on 01/09/2022 08:56:24 -------------------------------------------------------------------------------- Patient/Caregiver Education Details Patient Name: Date of Service: Hillman, Devine 3/8/2023andnbsp9:00 [place] Record Number: [id number] Patient Account Number: [id number] Date of Birth/Gender: Treating RN: 1956/06/13 (66 y.o. Hessie Diener Primary Care  Physician: Abelino Derrick Other Clinician: Referring Physician: Treating Physician/Extender: Hedda Slade in Treatment: 0 Education Assessment Education Provided To: Patient Education Topics Provided Elevated Blood Sugar/ Impact on Healing: Handouts: Elevated Blood Sugars: How Do They Affect Wound Healing Methods: Explain/Verbal, Printed Responses: Reinforcements needed Pain: Handouts: A Guide to Pain Control Methods: Explain/Verbal, Printed Responses: Reinforcements needed [place]: o Handouts: Welcome T [facility] [place] o Methods: Explain/Verbal, Printed Responses: Reinforcements needed Electronic Signature(s) Signed: 01/09/2022 4:44:26 PM By: Deon Pilling RN, BSN Entered By: Deon Pilling on 01/09/2022 09:39:42 -------------------------------------------------------------------------------- Wound Assessment Details Patient Name: Date of Service: KENON, DELASHMIT 01/09/2022 9:00 [place] Record Number: [id number] Patient Account Number: [id number] Date of Birth/Sex: Treating RN: Mar 06, 1956 (66 y.o. Lorette Ang, Meta.Reding Primary Care Capri Raben: Abelino Derrick Other Clinician: Referring Anapaola Kinsel: Treating Corbin Hott/Extender: Hedda Slade in Treatment: 0 Wound Status Wound Number: 1 Primary Venous Leg Ulcer Etiology: Wound Location: Right, Circumferential Lower Leg Wound Open Wounding Event: Gradually Appeared Status: Date Acquired: 12/13/2021 Comorbid Hypertension, Peripheral Arterial Disease, Peripheral Venous Weeks Of Treatment: 0 History: Disease, Type II Diabetes, Osteoarthritis, Neuropathy Clustered Wound: Yes Photos Wound Measurements Length: (cm) 9 Width: (cm) 39 Depth: (cm) 0.1 Clustered Quantity: 5 Area: (cm) 275.675 Volume: (cm) 27.567 % Reduction in Area: 0% % Reduction in Volume: 0% Epithelialization: Small (1-33%) Tunneling: No Undermining: No Wound  Description Classification: Full Thickness Without Exposed Support Structures Wound Margin: Distinct, outline attached Exudate Amount: Medium Exudate Type: Serosanguineous  Exudate Color: red, brown Foul Odor After Cleansing: No Slough/Fibrino Yes Wound Bed Granulation Amount: Medium (34-66%) Exposed Structure Granulation Quality: Red, Pink Fascia Exposed: No Necrotic Amount: Medium (34-66%) Fat Layer (Subcutaneous Tissue) Exposed: Yes Necrotic Quality: Adherent Slough Tendon Exposed: No Muscle Exposed: No Joint Exposed: No Bone Exposed: No Assessment Notes some areas dry drainage noted to right leg. Treatment Notes Wound #1 (Lower Leg) Wound Laterality: Right, Circumferential Cleanser Soap and Water Discharge Instruction: May shower and wash wound with dial antibacterial soap and water prior to dressing change. Wound Cleanser Discharge Instruction: Cleanse the wound with wound cleanser prior to applying a clean dressing using gauze sponges, not tissue or cotton balls. Peri-Wound Care Triamcinolone 15 (g) Discharge Instruction: Mix with lotion. Sween Lotion (Moisturizing lotion) Discharge Instruction: Apply moisturizing lotion as directed Topical Primary Dressing KerraCel Ag Gelling Fiber Dressing, 4x5 in (silver alginate) Discharge Instruction: Apply silver alginate to wound bed as instructed Secondary Dressing ABD Pad, 8x10 Discharge Instruction: Apply over primary dressing as directed. Zetuvit Plus 4x8 in Discharge Instruction: Apply over primary dressing as directed. Secured With Compression Wrap ThreePress (3 layer compression wrap) Discharge Instruction: Apply three layer compression as directed. Compression Stockings Add-Ons Electronic Signature(s) Signed: 01/09/2022 4:44:26 PM By: Deon Pilling RN, BSN Entered By: Deon Pilling on 01/09/2022 09:18:29 -------------------------------------------------------------------------------- Vitals Details Patient  Name: Date of Service: CARLYLE, MCELRATH 01/09/2022 9:00 [place] Record Number: [id number] Patient Account Number: [id number] Date of Birth/Sex: Treating RN: 09/30/1956 (66 y.o. Lorette Ang, Meta.Reding Primary Care Jaelene Garciagarcia: Abelino Derrick Other Clinician: Referring Noela Brothers: Treating Khizar Fiorella/Extender: Hedda Slade in Treatment: 0 Vital Signs Time Taken: 08:50 Temperature (F): 97.8 Height (in): 73 Pulse (bpm): 97 Source: Stated Respiratory Rate (breaths/min): 18 Source: Stated Respiratory Rate (breaths/min): 18 Weight (lbs): 231 Blood Pressure (mmHg): 178/79 Source: Stated Capillary Blood Glucose (mg/dl): 80 Body Mass Index (BMI): 30.5 Reference Range: 80 - 120 mg / dl Electronic Signature(s) Signed: 01/09/2022 4:44:26 PM By: Deon Pilling RN, BSN Entered By: Deon Pilling on 01/09/2022 08:55:07

## 2022-01-08 NOTE — Telephone Encounter (Signed)
Returned call for verbal order, no answer LMTCB ?

## 2022-01-09 ENCOUNTER — Other Ambulatory Visit: Payer: Self-pay

## 2022-01-09 ENCOUNTER — Ambulatory Visit (INDEPENDENT_AMBULATORY_CARE_PROVIDER_SITE_OTHER): Payer: Medicare Other

## 2022-01-09 ENCOUNTER — Encounter (HOSPITAL_BASED_OUTPATIENT_CLINIC_OR_DEPARTMENT_OTHER): Payer: Medicare Other | Attending: Physician Assistant | Admitting: Physician Assistant

## 2022-01-09 DIAGNOSIS — N179 Acute kidney failure, unspecified: Secondary | ICD-10-CM | POA: Diagnosis not present

## 2022-01-09 DIAGNOSIS — I890 Lymphedema, not elsewhere classified: Secondary | ICD-10-CM | POA: Insufficient documentation

## 2022-01-09 DIAGNOSIS — M1712 Unilateral primary osteoarthritis, left knee: Secondary | ICD-10-CM | POA: Diagnosis not present

## 2022-01-09 DIAGNOSIS — E11622 Type 2 diabetes mellitus with other skin ulcer: Secondary | ICD-10-CM | POA: Insufficient documentation

## 2022-01-09 DIAGNOSIS — E1122 Type 2 diabetes mellitus with diabetic chronic kidney disease: Secondary | ICD-10-CM | POA: Diagnosis not present

## 2022-01-09 DIAGNOSIS — L97812 Non-pressure chronic ulcer of other part of right lower leg with fat layer exposed: Secondary | ICD-10-CM | POA: Diagnosis not present

## 2022-01-09 DIAGNOSIS — Z86718 Personal history of other venous thrombosis and embolism: Secondary | ICD-10-CM | POA: Diagnosis not present

## 2022-01-09 DIAGNOSIS — Z7901 Long term (current) use of anticoagulants: Secondary | ICD-10-CM | POA: Insufficient documentation

## 2022-01-09 DIAGNOSIS — I129 Hypertensive chronic kidney disease with stage 1 through stage 4 chronic kidney disease, or unspecified chronic kidney disease: Secondary | ICD-10-CM | POA: Diagnosis not present

## 2022-01-09 DIAGNOSIS — N1832 Chronic kidney disease, stage 3b: Secondary | ICD-10-CM | POA: Diagnosis not present

## 2022-01-09 DIAGNOSIS — G4730 Sleep apnea, unspecified: Secondary | ICD-10-CM | POA: Diagnosis not present

## 2022-01-09 DIAGNOSIS — Z86711 Personal history of pulmonary embolism: Secondary | ICD-10-CM | POA: Diagnosis not present

## 2022-01-09 DIAGNOSIS — I872 Venous insufficiency (chronic) (peripheral): Secondary | ICD-10-CM | POA: Insufficient documentation

## 2022-01-09 DIAGNOSIS — L03115 Cellulitis of right lower limb: Secondary | ICD-10-CM | POA: Diagnosis not present

## 2022-01-09 DIAGNOSIS — E785 Hyperlipidemia, unspecified: Secondary | ICD-10-CM | POA: Diagnosis not present

## 2022-01-09 DIAGNOSIS — M545 Low back pain, unspecified: Secondary | ICD-10-CM | POA: Diagnosis not present

## 2022-01-09 DIAGNOSIS — Z794 Long term (current) use of insulin: Secondary | ICD-10-CM | POA: Diagnosis not present

## 2022-01-09 DIAGNOSIS — Z7985 Long-term (current) use of injectable non-insulin antidiabetic drugs: Secondary | ICD-10-CM | POA: Diagnosis not present

## 2022-01-09 LAB — POCT INR: INR: 2.7 (ref 2.0–3.0)

## 2022-01-09 NOTE — Patient Instructions (Addendum)
Pre visit review using our clinic review tool, if applicable. No additional management support is needed unless otherwise documented below in the visit note. ? ?Take 4 mg daily as long as you are taking the antibiotic. Please contact the coumadin clinic when you are finished with the antibiotic so your dosing can be adjusted. Recheck in 4 wks.  ?

## 2022-01-09 NOTE — Progress Notes (Addendum)
Pt reports he saw wound care this morning and they have extended his abx, cefadroxil. He is not sure how much longer he will need to take it but will let the coumadin clinic know when he is finished with them. Calendar has been updated to reflect 4 mg daily.  ?Take 4 mg daily as long as you are taking the antibiotic. Please contact the coumadin clinic when you are finished with the antibiotic so your dosing can be adjusted. Recheck in 4 wks.  ?

## 2022-01-09 NOTE — Progress Notes (Signed)
[place], Rajinder ([id number]) ?Visit Report for 01/09/2022 ?Abuse Risk Screen Details ?Patient Name: Date of Service: ?[name], [name] 01/09/2022 9:00 A M ?Medical Record Number: [id number] ?Patient Account Number: [id number] ?Date of Birth/Sex: Treating RN: ?Aug 28, 1956 (66 y.o. M) Rolin Barry, Bobbi ?Primary Care Marti Mclane: Abelino Derrick Other Clinician: ?Referring Dellas Guard: ?Treating Damonique Brunelle/Extender: Worthy Keeler ?Abelino Derrick ?Weeks in Treatment: 0 ?Abuse Risk Screen Items ?Answer ?ABUSE RISK SCREEN: ?Has anyone close to you tried to hurt or harm you recentlyo No ?Do you feel uncomfortable with anyone in your familyo No ?Has anyone forced you do things that you didnt want to doo No ?Electronic Signature(s) ?Signed: 01/09/2022 4:44:26 PM By: Deon Pilling RN, BSN ?Entered By: Deon Pilling on 01/09/2022 08:55:14 ?-------------------------------------------------------------------------------- ?Activities of Daily Living Details ?Patient Name: Date of Service: ?[name], [name] 01/09/2022 9:00 A M ?Medical Record Number: [id number] ?Patient Account Number: [id number] ?Date of Birth/Sex: Treating RN: ?01-05-1956 (66 y.o. M) Rolin Barry, Bobbi ?Primary Care [place]: Abelino Derrick Other Clinician: ?Referring Koula Venier: ?Treating Elektra Wartman/Extender: Worthy Keeler ?Abelino Derrick ?Weeks in Treatment: 0 ?Activities of Daily Living Items ?Answer ?Activities of Daily Living (Please select one for each item) ?Spring ?T Medications ?ake Completely Able ?Use T elephone Completely Able ?Care for Appearance Completely Able ?Use T oilet Completely Able ?Bath / Shower Completely Able ?Dress Self Completely Able ?Feed Self Completely Able ?Walk Completely Able ?Get In / Out Bed Completely Able ?Housework Completely Able ?Prepare Meals Completely Able ?Handle Money Completely Able ?Shop for Self Completely Able ?Electronic Signature(s) ?Signed: 01/09/2022 4:44:26 PM By: Deon Pilling RN, BSN ?Entered By: Deon Pilling on  01/09/2022 08:55:29 ?-------------------------------------------------------------------------------- ?Education Screening Details ?Patient Name: ?Date of Service: ?[name], [name] 01/09/2022 9:00 A M ?Medical Record Number: [id number] ?Patient Account Number: [id number] ?Date of Birth/Sex: ?Treating RN: ?[birth year] (65 y.o. M) Rolin Barry, Bobbi ?Primary Care Laquesha Holcomb: Abelino Derrick ?Other Clinician: ?Referring Kimmerly Lora: ?Treating Sabrie Moritz/Extender: Worthy Keeler ?Abelino Derrick ?Weeks in Treatment: 0 ?Primary Learner Assessed: Patient ?Learning Preferences/Education Level/Primary Language ?Learning Preference: Explanation, Demonstration, Printed Material ?Highest Education Level: College or Above ?Preferred Language: English ?Cognitive Barrier ?Language Barrier: No ?Translator Needed: No ?Memory Deficit: No ?Emotional Barrier: No ?Cultural/Religious Beliefs Affecting Medical Care: No ?Physical Barrier ?Impaired Vision: Yes Glasses ?Impaired Hearing: No ?Decreased Hand dexterity: No ?Knowledge/Comprehension ?Knowledge Level: High ?Comprehension Level: High ?Ability to understand written instructions: High ?Ability to understand verbal instructions: High ?Motivation ?Anxiety Level: Calm ?Cooperation: Cooperative ?Education Importance: Acknowledges Need ?Interest in Health Problems: Asks Questions ?Perception: Coherent ?Willingness to Engage in Self-Management High ?Activities: ?Readiness to Engage in Self-Management High ?Activities: ?Electronic Signature(s) ?Signed: 01/09/2022 4:44:26 PM By: Deon Pilling RN, BSN ?Entered By: Deon Pilling on 01/09/2022 08:55:51 ?-------------------------------------------------------------------------------- ?Fall Risk Assessment Details ?Patient Name: ?Date of Service: ?[name], [name] 01/09/2022 9:00 A M ?Medical Record Number: [id number] ?Patient Account Number: [id number] ?Date of Birth/Sex: ?Treating RN: ?[birth year] (66 y.o. M) Rolin Barry, Bobbi ?Primary Care Maryanna Stuber: Abelino Derrick ?Other  Clinician: ?Referring Aadhya Bustamante: ?Treating Darryle Dennie/Extender: Worthy Keeler ?Abelino Derrick ?Weeks in Treatment: 0 ?Fall Risk Assessment Items ?Have you had 2 or more falls in the last 12 monthso 0 No ?Have you had any fall that resulted in injury in the last 12 monthso 0 No ?FALLS RISK SCREEN ?History of falling - immediate or within 3 months 0 No ?Secondary diagnosis (Do you have 2 or more medical diagnoseso) 0 No ?Ambulatory aid ?None/bed rest/wheelchair/nurse 0 No ?Crutches/cane/walker 15 Yes ?Furniture 0 No ?Intravenous therapy Access/Saline/Heparin Lock 0 No ?Gait/Transferring ?Normal/ bed rest/ wheelchair 0 Yes ?Weak (short steps  with or without shuffle, stooped but able to lift head while walking, may seek 0 No ?support from furniture) ?Impaired (short steps with shuffle, may have difficulty arising from chair, head down, impaired 0 No ?balance) ?Mental Status ?Oriented to own ability 0 Yes ?Electronic Signature(s) ?Signed: 01/09/2022 4:44:26 PM By: Deon Pilling RN, BSN ?Entered By: Deon Pilling on 01/09/2022 08:56:02 ?-------------------------------------------------------------------------------- ?Foot Assessment Details ?Patient Name: ?Date of Service: ?[name], [name] 01/09/2022 9:00 A M ?Medical Record Number: [id number] ?Patient Account Number: [id number] ?Date of Birth/Sex: ?Treating RN: ?Oct 24, 1956 (66 y.o. M) Rolin Barry, Bobbi ?Primary Care Taleen Prosser: Abelino Derrick ?Other Clinician: ?Referring Shaden Higley: ?Treating Tikisha Molinaro/Extender: Worthy Keeler ?Abelino Derrick ?Weeks in Treatment: 0 ?Foot Assessment Items ?Site Locations ?+ = Sensation present, - = Sensation absent, C = Callus, U = Ulcer ?R = Redness, W = Warmth, M = Maceration, PU = Pre-ulcerative lesion ?F = Fissure, S = Swelling, D = Dryness ?Assessment ?Right: Left: ?Other Deformity: No No ?Prior Foot Ulcer: No No ?Prior Amputation: No No ?Charcot Joint: No No ?Ambulatory Status: Ambulatory With Help ?Assistance Device: Cane ?Gait: Steady ?Electronic  Signature(s) ?Signed: 01/09/2022 4:44:26 PM By: Deon Pilling RN, BSN ?Entered By: Deon Pilling on 01/09/2022 09:14:35 ?-------------------------------------------------------------------------------- ?Nutrition Risk Screening Details ?Patient Name: ?Date of Service: ?[name], [name] 01/09/2022 9:00 A M ?Medical Record Number: [id number] ?Patient Account Number: [id number] ?Date of Birth/Sex: ?Treating RN: ?31-May-1956 (66 y.o. M) Rolin Barry, Bobbi ?Primary Care Benz Vandenberghe: Abelino Derrick ?Other Clinician: ?Referring Julissa Browning: ?Treating Stonewall Doss/Extender: Worthy Keeler ?Abelino Derrick ?Weeks in Treatment: 0 ?Height (in): 73 ?Weight (lbs): 231 ?Body Mass Index (BMI): 30.5 ?Nutrition Risk Screening Items ?Score Screening ?NUTRITION RISK SCREEN: ?I have an illness or condition that made me change the kind and/or amount of food I eat 2 Yes ?I eat fewer than two meals per day 0 No ?I eat few fruits and vegetables, or milk products 0 No ?I have three or more drinks of beer, liquor or wine almost every day 0 No ?I have tooth or mouth problems that make it hard for me to eat 0 No ?I don't always have enough money to buy the food I need 0 No ?I eat alone most of the time 0 No ?I take three or more different prescribed or over-the-counter drugs a day 1 Yes ?Without wanting to, I have lost or gained 10 pounds in the last six months 0 No ?I am not always physically able to shop, cook and/or feed myself 0 No ?Nutrition Protocols ?Good Risk Protocol ?Provide education on elevated blood ?Moderate Risk Protocol 0 sugars and impact on wound healing, ?as applicable ?High Risk Proctocol ?Risk Level: Moderate Risk ?Score: 3 ?Electronic Signature(s) ?Signed: 01/09/2022 4:44:26 PM By: Deon Pilling RN, BSN ?Entered By: Deon Pilling on 01/09/2022 08:56:09 ?

## 2022-01-09 NOTE — Progress Notes (Signed)
SUTCH, Jeydan ([id number]) Visit Report for 01/09/2022 Chief Complaint Document Details Patient Name: Date of Service: [name], [name] 01/09/2022 9:00 A M Medical Record Number: [id number] Patient Account Number: [id number] Date of Birth/Sex: Treating RN: 16-Mar-1956 (66 y.o. M) Primary Care Provider: Abelino Derrick Other Clinician: Referring Provider: Treating Provider/Extender: Hedda Slade in Treatment: 0 Information Obtained from: Patient Chief Complaint Right LE Ulcer Electronic Signature(s) Signed: 01/09/2022 9:37:04 AM By: Worthy Keeler PA-C Entered By: Worthy Keeler on 01/09/2022 09:37:04 -------------------------------------------------------------------------------- Debridement Details Patient Name: Date of Service: [name], [name] 01/09/2022 9:00 A M Medical Record Number: [id number] Patient Account Number: [id number] Date of Birth/Sex: Treating RN: May 06, 1956 (66 y.o. Hessie Diener Primary Care Provider: Abelino Derrick Other Clinician: Referring Provider: Treating Provider/Extender: Hedda Slade in Treatment: 0 Debridement Performed for Assessment: Wound #1 Right,Circumferential Lower Leg Performed By: Clinician Deon Pilling, RN Debridement Type: Chemical/Enzymatic/Mechanical Agent Used: gauze and wound cleanser Severity of Tissue Pre Debridement: Fat layer exposed Level of Consciousness (Pre-procedure): Awake and Alert Pre-procedure Verification/Time Out No Taken: Bleeding: None Response to Treatment: Procedure was tolerated well Level of Consciousness (Post- Awake and Alert procedure): Post Debridement Measurements of Total Wound Length: (cm) 9 Width: (cm) 39 Depth: (cm) 0.1 Volume: (cm) 27.567 Character of Wound/Ulcer Post Debridement: Stable Severity of Tissue Post Debridement: Fat layer exposed Post Procedure Diagnosis Same as Pre-procedure Electronic Signature(s) Signed: 01/09/2022 3:53:22 PM By: Worthy Keeler PA-C Signed: 01/09/2022 4:44:26 PM By: Deon Pilling RN, BSN Entered By: Deon Pilling on 01/09/2022 09:40:42 -------------------------------------------------------------------------------- HPI Details Patient Name: Date of Service: [name], [name] 01/09/2022 9:00 [place] Record Number: [id number] Patient Account Number: [id number] Date of Birth/Sex: Treating RN: 1956/11/04 (66 y.o. M) Primary Care Provider: Abelino Derrick Other Clinician: Referring Provider: Treating Provider/Extender: Hedda Slade in Treatment: 0 History of Present Illness HPI Description: 01/09/2022 upon evaluation today patient appears to be doing well with regard to his leg compared to where things have been. I did review his note and it appears he had significant cellulitis. He was actually in the hospital due to sepsis from February 19 February 13. The good news is he recovered from that he was placed on Duricef for a time and then this was after the Keflex. The Keflex was really not effective. Eventually he was transitioned to Bactrim which is what he has been taking currently he tells me when they switch to that is when things really got better. That was due to a culture which showed Enterobacter as the main organism with Klebsiella being a secondary. Nonetheless in the end I do feel like that the Bactrim has done a good job for him based on what he is telling me and what I am seeing. He did have a CT scan which was on 12/07/2021 which showed cellulitis fortunately nothing deeper structure was infected and no abscess. He also had a DVT study on 12/07/2020 which was negative. On 2/24 is when the culture was performed. His most recent hemoglobin A1c was 6.4 according to his primary care provider and the patient currently is on the Bactrim for a couple more days along with an Unna boot and alginate which appears to been the order although what he had was Curlex and Coban on when he came in today  with alginate. Patient does have a history of chronic venous insufficiency, lymphedema, diabetes mellitus type 2, long-term use of anticoagulant therapy due to a personal history of DVT. Electronic  Signature(s) Signed: 01/09/2022 9:51:11 AM By: Worthy Keeler PA-C Entered By: Worthy Keeler on 01/09/2022 09:51:10 -------------------------------------------------------------------------------- Physical Exam Details Patient Name: Date of Service: [name], [name] 01/09/2022 9:00 A M Medical Record Number: [id number] Patient Account Number: [id number] Date of Birth/Sex: Treating RN: [birth year] (66 y.o. M) Primary Care Provider: Abelino Derrick Other Clinician: Referring Provider: Treating Provider/Extender: Hedda Slade in Treatment: 0 Constitutional patient is hypertensive.. pulse regular and within target range for patient.[name] respirations regular, non-labored and within target range for patient.[name] temperature within target range for patient.. Well-nourished and well-hydrated in no acute distress. Eyes conjunctiva clear no eyelid edema noted. pupils equal round and reactive to light and accommodation. Ears, Nose, Mouth, and Throat no gross abnormality of ear auricles or external auditory canals. normal hearing noted during conversation. mucus membranes moist. Respiratory normal breathing without difficulty. Cardiovascular 1+ dorsalis pedis/posterior tibialis pulses. no clubbing, cyanosis, significant edema, <3 sec cap refill. Musculoskeletal normal gait and posture. no significant deformity or arthritic changes, no loss or range of motion, no clubbing. Psychiatric this patient is able to make decisions and demonstrates good insight into disease process. Alert and Oriented x 3. pleasant and cooperative. Notes Dramatically improved compared to where it was according to what the patient still may have 1 reading in the notes as well which is great news. Upon inspection  patient's wound bed actually showed signs of good granulation epithelization at this point. Fortunately there does not appear to be any evidence of active infection locally nor systemically which is great news. Unfortunately he does have a significant infection although fortunately this seems to be dramatically improved according to what the patient is telling me with the initiation of the Bactrim. Electronic Signature(s) Signed: 01/09/2022 9:55:47 AM By: Worthy Keeler PA-C Entered By: Worthy Keeler on 01/09/2022 09:55:46 -------------------------------------------------------------------------------- Physician Orders Details Patient Name: Date of Service: [name], [name] 01/09/2022 9:00 A M Medical Record Number: [id number] Patient Account Number: [id number] Date of Birth/Sex: Treating RN: October 24, 1956 (66 y.o. Lorette Ang, Meta.Reding Primary Care Provider: Abelino Derrick Other Clinician: Referring Provider: Treating Provider/Extender: Hedda Slade in Treatment: 0 Verbal / Phone Orders: No Diagnosis Coding ICD-10 Coding Code Description I87.2 Venous insufficiency (chronic) (peripheral) I89.0 Lymphedema, not elsewhere classified E11.622 Type 2 diabetes mellitus with other skin ulcer L97.812 Non-pressure chronic ulcer of other part of right lower leg with fat layer exposed Z79.01 Long term (current) use of anticoagulants Z86.718 Personal history of other venous thrombosis and embolism Follow-up Appointments ppointment in 2 weeks. Margarita Grizzle and Bobbi Wednesday Room 8 Return A Other: - ***Pick up extended oral antibiotics and TCA cream from pharmacy.*** Edema Control - Lymphedema / SCD / Other Elevate legs to the level of the heart or above for 30 minutes daily and/or when sitting, a frequency of: - 3-4 times a day throughout the day. Avoid standing for long periods of time. Exercise regularly Moisturize legs daily. - left leg every night before bed. Home Health New wound  care orders this week; continue Home Health for wound care. May utilize formulary equivalent dressing for wound treatment orders unless otherwise specified. - week of 01/14/2022 3 times a week Monday, Wednesday, Friday week of 01/21/22 2 times that week home health to change- Monday and Friday. Wound Center Wednesday. Other Home Health Orders/Instructions: - [facility] Wound Treatment Wound #1 - Lower Leg Wound Laterality: Right, Circumferential Cleanser: Soap and Water ([place]) 3 x Per Week/30 Days Discharge Instructions: May  shower and wash wound with dial antibacterial soap and water prior to dressing change. Cleanser: Wound Cleanser (Home Health) 3 x Per Week/30 Days Discharge Instructions: Cleanse the wound with wound cleanser prior to applying a clean dressing using gauze sponges, not tissue or cotton balls. Peri-Wound Care: Triamcinolone 15 (g) (Home Health) 3 x Per Week/30 Days Discharge Instructions: Mix with lotion. Peri-Wound Care: Sween Lotion (Moisturizing lotion) (Home Health) 3 x Per Week/30 Days Discharge Instructions: Apply moisturizing lotion as directed Prim Dressing: KerraCel Ag Gelling Fiber Dressing, 4x5 in (silver alginate) (Home Health) 3 x Per Week/30 Days ary Discharge Instructions: Apply silver alginate to wound bed as instructed Secondary Dressing: ABD Pad, 8x10 (Home Health) 3 x Per Week/30 Days Discharge Instructions: Apply over primary dressing as directed. Secondary Dressing: Zetuvit Plus 4x8 in (Home Health) 3 x Per Week/30 Days Discharge Instructions: Apply over primary dressing as directed. Compression Wrap: ThreePress (3 layer compression wrap) (Home Health) 3 x Per Week/30 Days Discharge Instructions: Apply three layer compression as directed. Patient Medications llergies: No Known Allergies A Notifications Medication Indication Start End 01/09/2022 Bactrim DS DOSE 1 - oral 800 mg-160 mg tablet - 1 tablet oral taken 2 times per day for 14  days 01/09/2022 triamcinolone acetonide DOSE topical 0.1 % cream - cream topical mixed with Eucerin lotion at home half and half and apply to the right leg with each wrap change 3 times per week. Electronic Signature(s) Signed: 01/09/2022 9:57:57 AM By: Worthy Keeler PA-C Entered By: Worthy Keeler on 01/09/2022 09:57:56 -------------------------------------------------------------------------------- Problem List Details Patient Name: Date of Service: [name], [name] 01/09/2022 9:00 A M Medical Record Number: [id number] Patient Account Number: [id number] Date of Birth/Sex: Treating RN: 10/10/1956 (66 y.o. M) Primary Care Provider: Abelino Derrick Other Clinician: Referring Provider: Treating Provider/Extender: Hedda Slade in Treatment: 0 Active Problems ICD-10 Encounter Code Description Active Date MDM Diagnosis I87.2 Venous insufficiency (chronic) (peripheral) 01/09/2022 No Yes I89.0 Lymphedema, not elsewhere classified 01/09/2022 No Yes E11.622 Type 2 diabetes mellitus with other skin ulcer 01/09/2022 No Yes L97.812 Non-pressure chronic ulcer of other part of right lower leg with fat layer 01/09/2022 No Yes exposed Z79.01 Long term (current) use of anticoagulants 01/09/2022 No Yes Z86.718 Personal history of other venous thrombosis and embolism 01/09/2022 No Yes Inactive Problems Resolved Problems Electronic Signature(s) Signed: 01/09/2022 9:36:46 AM By: Worthy Keeler PA-C Entered By: Worthy Keeler on 01/09/2022 09:36:45 -------------------------------------------------------------------------------- Progress Note Details Patient Name: Date of Service: [name], [name] 01/09/2022 9:00 [place] Record Number: [id number] Patient Account Number: [id number] Date of Birth/Sex: Treating RN: 29-Jul-1956 (66 y.o. M) Primary Care Provider: Abelino Derrick Other Clinician: Referring Provider: Treating Provider/Extender: Hedda Slade in  Treatment: 0 Subjective Chief Complaint Information obtained from Patient Right LE Ulcer History of Present Illness (HPI) 01/09/2022 upon evaluation today patient appears to be doing well with regard to his leg compared to where things have been. I did review his note and it appears he had significant cellulitis. He was actually in the hospital due to sepsis from February 19 February 13. The good news is he recovered from that he was placed on Duricef for a time and then this was after the Keflex. The Keflex was really not effective. Eventually he was transitioned to Bactrim which is what he has been taking currently he tells me when they switch to that is when things really got better. That was due to a culture which showed Enterobacter as the  main organism with Klebsiella being a secondary. Nonetheless in the end I do feel like that the Bactrim has done a good job for him based on what he is telling me and what I am seeing. He did have a CT scan which was on 12/07/2021 which showed cellulitis fortunately nothing deeper structure was infected and no abscess. He also had a DVT study on 12/07/2020 which was negative. On 2/24 is when the culture was performed. His most recent hemoglobin A1c was 6.4 according to his primary care provider and the patient currently is on the Bactrim for a couple more days along with an Unna boot and alginate which appears to been the order although what he had was Curlex and Coban on when he came in today with alginate. Patient does have a history of chronic venous insufficiency, lymphedema, diabetes mellitus type 2, long-term use of anticoagulant therapy due to a personal history of DVT. Patient History Information obtained from Patient, Chart. Allergies No Known Allergies Family History Cancer - Mother, Diabetes - Mother,Father, Heart Disease - Father, No family history of Hereditary Spherocytosis, Hypertension, Kidney Disease, Lung Disease, Seizures, Stroke, Thyroid  Problems, Tuberculosis. Social History Never smoker, Marital Status - Single, Alcohol Use - Rarely - beer, Drug Use - No History, Caffeine Use - Daily - daily coffee. Medical History Eyes Denies history of Cataracts, Glaucoma, Optic Neuritis Ear/Nose/Mouth/Throat Denies history of Middle ear problems Hematologic/Lymphatic Denies history of Anemia, Hemophilia, Human Immunodeficiency Virus, Lymphedema, Sickle Cell Disease Respiratory Denies history of Aspiration, Asthma, Chronic Obstructive Pulmonary Disease (COPD), Pneumothorax, Sleep Apnea, Tuberculosis Cardiovascular Patient has history of Hypertension, Peripheral Arterial Disease, Peripheral Venous Disease Denies history of Angina, Arrhythmia, Congestive Heart Failure, Coronary Artery Disease, Deep Vein Thrombosis, Hypotension, Myocardial Infarction, Phlebitis, Vasculitis Gastrointestinal Denies history of Cirrhosis , Colitis, Crohnoos, Hepatitis A, Hepatitis B, Hepatitis C Endocrine Patient has history of Type II Diabetes Genitourinary Denies history of End Stage Renal Disease Immunological Denies history of Lupus Erythematosus, Raynaudoos, Scleroderma Integumentary (Skin) Denies history of History of Burn Musculoskeletal Patient has history of Osteoarthritis - Left knee Denies history of Gout, Rheumatoid Arthritis, Osteomyelitis Neurologic Patient has history of Neuropathy Denies history of Dementia, Quadriplegia, Paraplegia, Seizure Disorder Oncologic Denies history of Received Chemotherapy, Received Radiation Psychiatric Denies history of Anorexia/bulimia, Confinement Anxiety Patient is treated with Controlled Diet, Insulin. Blood sugar is tested. Hospitalization/Surgery History - 2/9-2/13 inpatient cellulitis right leg, sepsis. 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 - 2009 DVT. Medical A Surgical History Notes nd Respiratory Hx of Pulmonary Embolism Cardiovascular Diminished pulses in lower extremity DVT 2009 Left  leg Genitourinary Chronic Kidney Disease Stage 3b AKI Renal Stones Gross Hematuria Ureterolithiasis Musculoskeletal Left knee replacement Review of Systems (ROS) Constitutional Symptoms (General Health) Denies complaints or symptoms of Fatigue, Fever, Chills, Marked Weight Change. Eyes Complains or has symptoms of Glasses / Contacts. Ear/Nose/Mouth/Throat Denies complaints or symptoms of Chronic sinus problems or rhinitis. Respiratory Denies complaints or symptoms of Chronic or frequent coughs, Shortness of Breath. Gastrointestinal Denies complaints or symptoms of Frequent diarrhea, Nausea, Vomiting. Integumentary (Skin) Complains or has symptoms of Wounds - right leg. Neurologic Denies complaints or symptoms of Numbness/parasthesias. Psychiatric Denies complaints or symptoms of Claustrophobia, Suicidal. Objective Constitutional patient is hypertensive.. pulse regular and within target range for patient.[name] respirations regular, non-labored and within target range for patient.[name] temperature within target range for patient.. Well-nourished and well-hydrated in no acute distress. Vitals Time Taken: 8:50 AM, Height: 73 in, Source: Stated, Weight: 231 lbs, Source: Stated, BMI: 30.5, Temperature:  97.8 F, Pulse: 97 bpm, Respiratory Rate: 18 breaths/min, Blood Pressure: 178/79 mmHg, Capillary Blood Glucose: 80 mg/dl. Eyes conjunctiva clear no eyelid edema noted. pupils equal round and reactive to light and accommodation. Ears, Nose, Mouth, and Throat no gross abnormality of ear auricles or external auditory canals. normal hearing noted during conversation. mucus membranes moist. Respiratory normal breathing without difficulty. Cardiovascular 1+ dorsalis pedis/posterior tibialis pulses. no clubbing, cyanosis, significant edema, Musculoskeletal normal gait and posture. no significant deformity or arthritic changes, no loss or range of motion, no clubbing. Psychiatric this patient is able  to make decisions and demonstrates good insight into disease process. Alert and Oriented x 3. pleasant and cooperative. General Notes: Dramatically improved compared to where it was according to what the patient still may have 1 reading in the notes as well which is great news. Upon inspection patient's wound bed actually showed signs of good granulation epithelization at this point. Fortunately there does not appear to be any evidence of active infection locally nor systemically which is great news. Unfortunately he does have a significant infection although fortunately this seems to be dramatically improved according to what the patient is telling me with the initiation of the Bactrim. Integumentary ([name], Skin) Wound #1 status is Open. Original cause of wound was Gradually Appeared. The date acquired was: 12/13/2021. The wound is located on the Right,Circumferential Lower Leg. The wound measures 9cm length x 39cm width x 0.1cm depth; 275.675cm^2 area and 27.567cm^3 volume. There is Fat Layer (Subcutaneous Tissue) exposed. There is no tunneling or undermining noted. There is a medium amount of serosanguineous drainage noted. The wound margin is distinct with the outline attached to the wound base. There is medium (34-66%) red, pink granulation within the wound bed. There is a medium (34-66%) amount of necrotic tissue within the wound bed including Adherent Slough. General Notes: some areas dry drainage noted to right leg. Assessment Active Problems ICD-10 Venous insufficiency (chronic) (peripheral) Lymphedema, not elsewhere classified Type 2 diabetes mellitus with other skin ulcer Non-pressure chronic ulcer of other part of right lower leg with fat layer exposed Long term (current) use of anticoagulants Personal history of other venous thrombosis and embolism Procedures Wound #1 Pre-procedure diagnosis of Wound #1 is a Venous Leg Ulcer located on the Right,Circumferential Lower Leg .Severity  of Tissue Pre Debridement is: Fat layer exposed. There was a Chemical/Enzymatic/Mechanical debridement performed by Deon Pilling, RN.. Other agent used was gauze and wound cleanser. There was no bleeding. The procedure was tolerated well. Post Debridement Measurements: 9cm length x 39cm width x 0.1cm depth; 27.567cm^3 volume. Character of Wound/Ulcer Post Debridement is stable. Severity of Tissue Post Debridement is: Fat layer exposed. Post procedure Diagnosis Wound #1: Same as Pre-Procedure Pre-procedure diagnosis of Wound #1 is a Venous Leg Ulcer located on the Right,Circumferential Lower Leg . There was a Three Layer Compression Therapy Procedure by Deon Pilling, RN. Post procedure Diagnosis Wound #1: Same as Pre-Procedure Plan Follow-up Appointments: Return Appointment in 2 weeks. Margarita Grizzle and Bobbi Wednesday Room 8 Other: - ***Pick up extended oral antibiotics and TCA cream from pharmacy.*** Edema Control - Lymphedema / SCD / Other: Elevate legs to the level of the heart or above for 30 minutes daily and/or when sitting, a frequency of: - 3-4 times a day throughout the day. Avoid standing for long periods of time. Exercise regularly Moisturize legs daily. - left leg every night before bed. Home Health: New wound care orders this week; continue Home Health for wound care. May  utilize formulary equivalent dressing for wound treatment orders unless otherwise specified. - week of 01/14/2022 3 times a week Monday, Wednesday, Friday week of 01/21/22 2 times that week home health to change- Monday and Friday. Wound Center Wednesday. Other Home Health Orders/Instructions: - [facility] The following medication(s) was prescribed: Bactrim DS oral 800 mg-160 mg tablet 1 1 tablet oral taken 2 times per day for 14 days starting 01/09/2022 triamcinolone acetonide topical 0.1 % cream cream topical mixed with Eucerin lotion at home half and half and apply to the right leg with each wrap change  3 times per week. starting 01/09/2022 WOUND #1: - Lower Leg Wound Laterality: Right, Circumferential Cleanser: Soap and Water (Home Health) 3 x Per Week/30 Days Discharge Instructions: May shower and wash wound with dial antibacterial soap and water prior to dressing change. Cleanser: Wound Cleanser (Home Health) 3 x Per Week/30 Days Discharge Instructions: Cleanse the wound with wound cleanser prior to applying a clean dressing using gauze sponges, not tissue or cotton balls. Peri-Wound Care: Triamcinolone 15 (g) (Home Health) 3 x Per Week/30 Days Discharge Instructions: Mix with lotion. Peri-Wound Care: Sween Lotion (Moisturizing lotion) (Home Health) 3 x Per Week/30 Days Discharge Instructions: Apply moisturizing lotion as directed Prim Dressing: KerraCel Ag Gelling Fiber Dressing, 4x5 in (silver alginate) (Home Health) 3 x Per Week/30 Days ary Discharge Instructions: Apply silver alginate to wound bed as instructed Secondary Dressing: ABD Pad, 8x10 (Home Health) 3 x Per Week/30 Days Discharge Instructions: Apply over primary dressing as directed. Secondary Dressing: Zetuvit Plus 4x8 in (Home Health) 3 x Per Week/30 Days Discharge Instructions: Apply over primary dressing as directed. Com pression Wrap: ThreePress (3 layer compression wrap) (Home Health) 3 x Per Week/30 Days Discharge Instructions: Apply three layer compression as directed. 1. Would recommend currently that we going continue with the wound care measures as before and the patient is in agreement with plan. This includes the use of the compression wrapping though I am can increase this to a 3 layer compression wrap I think this is the new bedding and Curlex and Coban. 2. I am also can recommend a reinitiation and upping of the Bactrim I do want him to have this for an additional 2 weeks based on what I am seeing currently I think this is absolutely appropriate and seems to be doing well. 3. I would also suggest based on what  we are seeing currently that we have the patient initiate treatment with triamcinolone as well as a mixture of this with lotion in order to help with the dry skin and drainage loosen up around the leg as well as the inflammation. I think this will be a good option and will mix this half- and-half I Minna send that into the pharmacy for him as well. 4. I am also can recommend silver alginate to the open areas as well as a absorptive dressing such as XtraSorb or else to [employer] to cover. We will see patient back for reevaluation in 1 week here in the clinic. If anything worsens or changes patient will contact our office for additional recommendations. Electronic Signature(s) Signed: 01/09/2022 9:58:16 AM By: Worthy Keeler PA-C Entered By: Worthy Keeler on 01/09/2022 09:58:16 -------------------------------------------------------------------------------- HxROS Details Patient Name: Date of Service: [name], [name] 01/09/2022 9:00 [place] Record Number: [id number] Patient Account Number: [id number] Date of Birth/Sex: Treating RN: 1956/01/11 (66 y.o. M) Primary Care Provider: Abelino Derrick Other Clinician: Referring Provider: Treating Provider/Extender: Mosetta Pigeon  Weeks in Treatment: 0 Information Obtained From Patient Chart Constitutional Symptoms (General Health) Complaints and Symptoms: Negative for: Fatigue; Fever; Chills; Marked Weight Change Eyes Complaints and Symptoms: Positive for: Glasses / Contacts Medical History: Negative for: Cataracts; Glaucoma; Optic Neuritis Ear/Nose/Mouth/Throat Complaints and Symptoms: Negative for: Chronic sinus problems or rhinitis Medical History: Negative for: Middle ear problems Respiratory Complaints and Symptoms: Negative for: Chronic or frequent coughs; Shortness of Breath Medical History: Negative for: Aspiration; Asthma; Chronic Obstructive Pulmonary Disease (COPD); Pneumothorax; Sleep Apnea; Tuberculosis Past  Medical History Notes: Hx of Pulmonary Embolism Gastrointestinal Complaints and Symptoms: Negative for: Frequent diarrhea; Nausea; Vomiting Medical History: Negative for: Cirrhosis ; Colitis; Crohns; Hepatitis A; Hepatitis B; Hepatitis C Integumentary (Skin) Complaints and Symptoms: Positive for: Wounds - right leg Medical History: Negative for: History of Burn Neurologic Complaints and Symptoms: Negative for: Numbness/parasthesias Medical History: Positive for: Neuropathy Negative for: Dementia; Quadriplegia; Paraplegia; Seizure Disorder Psychiatric Complaints and Symptoms: Negative for: Claustrophobia; Suicidal Medical History: Negative for: Anorexia/bulimia; Confinement Anxiety Hematologic/Lymphatic Medical History: Negative for: Anemia; Hemophilia; Human Immunodeficiency Virus; Lymphedema; Sickle Cell Disease Cardiovascular Medical History: Positive for: Hypertension; Peripheral Arterial Disease; Peripheral Venous Disease Negative for: Angina; Arrhythmia; Congestive Heart Failure; Coronary Artery Disease; Deep Vein Thrombosis; Hypotension; Myocardial Infarction; Phlebitis; Vasculitis Past Medical History Notes: Diminished pulses in lower extremity DVT 2009 Left leg Endocrine Medical History: Positive for: Type II Diabetes Time with diabetes: 2009 Treated with: Insulin, Diet Blood sugar tested every day: Yes Tested : daily Genitourinary Medical History: Negative for: End Stage Renal Disease Past Medical History Notes: Chronic Kidney Disease Stage 3b AKI Renal Stones Gross Hematuria Ureterolithiasis Immunological Medical History: Negative for: Lupus Erythematosus; Raynauds; Scleroderma Musculoskeletal Medical History: Positive for: Osteoarthritis - Left knee Negative for: Gout; Rheumatoid Arthritis; Osteomyelitis Past Medical History Notes: Left knee replacement Oncologic Medical History: Negative for: Received Chemotherapy; Received  Radiation Immunizations Pneumococcal Vaccine: Received Pneumococcal Vaccination: Yes Received Pneumococcal Vaccination On or After 60th Birthday: Yes Implantable Devices No devices added Hospitalization / Surgery History Type of Hospitalization/Surgery 2/9-2/13 inpatient cellulitis right leg, sepsis left total knee replacement 10/22 2009 DVT Family and Social History Cancer: Yes - Mother; Diabetes: Yes - Mother,Father; Heart Disease: Yes - Father; Hereditary Spherocytosis: No; Hypertension: No; Kidney Disease: No; Lung Disease: No; Seizures: No; Stroke: No; Thyroid Problems: No; Tuberculosis: No; Never smoker; Marital Status - Single; Alcohol Use: Rarely - beer; Drug Use: No History; Caffeine Use: Daily - daily coffee; Financial Concerns: No; Food, Clothing or Shelter Needs: No; Support System Lacking: No; Transportation Concerns: No Electronic Signature(s) Signed: 01/09/2022 3:53:22 PM By: Worthy Keeler PA-C Signed: 01/09/2022 4:44:26 PM By: Deon Pilling RN, BSN Entered By: Deon Pilling on 01/09/2022 09:02:30 -------------------------------------------------------------------------------- SuperBill Details Patient Name: Date of Service: [name], [name] 01/09/2022 Medical Record Number: [id number] Patient Account Number: [id number] Date of Birth/Sex: Treating RN: Apr 27, 1956 (66 y.o. Lorette Ang, Meta.Reding Primary Care Provider: Abelino Derrick Other Clinician: Referring Provider: Treating Provider/Extender: Hedda Slade in Treatment: 0 Diagnosis Coding ICD-10 Codes Code Description 9075127608 Venous insufficiency (chronic) (peripheral) I89.0 Lymphedema, not elsewhere classified E11.622 Type 2 diabetes mellitus with other skin ulcer L97.812 Non-pressure chronic ulcer of other part of right lower leg with fat layer exposed Z79.01 Long term (current) use of anticoagulants Z86.718 Personal history of other venous thrombosis and embolism Facility Procedures CPT4 Code:  92119417 Description: 99214 - WOUND CARE VISIT-LEV 4 EST PT Modifier: Quantity: 1 CPT4 Code: [id number] Description: 85631 - [place]BR[place] W/O ANES NON SELECT Modifier: Quantity: 1 Physician  Procedures : CPT4 Code Description Modifier [id number] - WC PHYS LEVEL 4 - NEW PT 25 ICD-10 Diagnosis Description I87.2 Venous insufficiency (chronic) (peripheral) I89.0 Lymphedema, not elsewhere classified E11.622 Type 2 diabetes mellitus with other skin ulcer  L97.812 Non-pressure chronic ulcer of other part of right lower leg with fat layer exposed Quantity: 1 Electronic Signature(s) Signed: 01/09/2022 9:58:34 AM By: Worthy Keeler PA-C Entered By: Worthy Keeler on 01/09/2022 09:58:33

## 2022-01-10 DIAGNOSIS — G4733 Obstructive sleep apnea (adult) (pediatric): Secondary | ICD-10-CM | POA: Diagnosis not present

## 2022-01-11 ENCOUNTER — Ambulatory Visit (INDEPENDENT_AMBULATORY_CARE_PROVIDER_SITE_OTHER): Payer: Medicare Other | Admitting: Family Medicine

## 2022-01-11 ENCOUNTER — Encounter: Payer: Self-pay | Admitting: Family Medicine

## 2022-01-11 ENCOUNTER — Other Ambulatory Visit: Payer: Self-pay

## 2022-01-11 VITALS — BP 130/80 | HR 77 | Temp 96.5°F | Ht 73.0 in | Wt 230.8 lb

## 2022-01-11 DIAGNOSIS — L03115 Cellulitis of right lower limb: Secondary | ICD-10-CM | POA: Diagnosis not present

## 2022-01-11 NOTE — Progress Notes (Signed)
Established Patient Office Visit  Subjective:  Patient ID: [name], male    DOB: 30-Jan-1956  Age: 66 y.o. MRN: [id number]  CC:  Chief Complaint  Patient presents with   Follow-up    10 day follow up    HPI [name] presents for follow up of cellulitis.  Fortunately it is improving.  There is less the erythema and drainage patient tells me.  Currently also being followed by home health nursing and the wound care center.  There has been improvement since switching to Septra.  Physician at the wound care center extended his Septra dosing.  He will follow-up there on the 12th.  Nursing notes were reviewed.  Past Medical History:  Diagnosis Date   Arthritis    Chronic kidney disease    reduced kidney function. Stage III   Diabetes mellitus without complication (HCC)    DVT (deep venous thrombosis) [facility])    age indeterminate LLE DVT 02/09/20   Dyspnea    On exertion   History of kidney stones    History of pulmonary embolus (PE)    Hypertension    Lipidemia    Prostate enlargement    Pulmonary embolism ([place]) 2009   Sleep apnea     Past Surgical History:  Procedure Laterality Date   CYSTOSCOPY W/ URETERAL STENT PLACEMENT Left 10/08/2020   Procedure: CYSTOSCOPY WITH RETROGRADE PYELOGRAM/URETERAL STENT PLACEMENT;  Surgeon: Irine Seal, MD;  Location: [place];  Service: Urology;  Laterality: Left;   CYSTOSCOPY/URETEROSCOPY/HOLMIUM LASER/STENT PLACEMENT Right 06/07/2020   Procedure: CYSTOSCOPY/URETEROSCOPY/HOLMIUM LASER/STENT PLACEMENT;  Surgeon: Lucas Mallow, MD;  Location: [facility];  Service: Urology;  Laterality: Right;   CYSTOSCOPY/URETEROSCOPY/HOLMIUM LASER/STENT PLACEMENT Left 10/19/2020   Procedure: CYSTOSCOPY LEFT URETEROSCOPY/HOLMIUM LASER/STENT EXCHANGE;  Surgeon: Irine Seal, MD;  Location: [facility];  Service: Urology;  Laterality: Left;   TOTAL KNEE ARTHROPLASTY Left 08/06/2021   Procedure: LEFT TOTAL KNEE ARTHROPLASTY;  Surgeon: Leandrew Koyanagi, MD;  Location: [place];   Service: Orthopedics;  Laterality: Left;    Family History  Problem Relation Age of Onset   Cancer Mother    Heart disease Father     Social History   Socioeconomic History   Marital status: Significant Other    Spouse name: Not on file   Number of children: Not on file   Years of education: Not on file   Highest education level: Not on file  Occupational History   Occupation: retired  Tobacco Use   Smoking status: Never   Smokeless tobacco: Never  Vaping Use   Vaping Use: Never used  Substance and Sexual Activity   Alcohol use: Yes    Alcohol/week: 1.0 standard drink    Types: 1 Cans of beer per week    Comment: social   Drug use: Never   Sexual activity: Yes  Other Topics Concern   Not on file  Social History Narrative   Lives with Friend   Right handed   Drinks 1-2 cups caffeine daily   Social Determinants of Health   Financial Resource Strain: Not on file  Food Insecurity: Not on file  Transportation Needs: Not on file  Physical Activity: Not on file  Stress: Not on file  Social Connections: Not on file  Intimate Partner Violence: Not on file    Outpatient Medications Prior to Visit  Medication Sig Dispense Refill   ACCU-CHEK GUIDE test strip USE TO CHECK BLOOD SUGAR 3 TIMES DAILY.E11.65 100 strip 12   Accu-Chek Softclix Lancets  lancets Use as instructed to check blood sugar 3 times daily Dx is E11.65 100 each 2   BD INSULIN SYRINGE U/F 31G X 5/16" 1 ML MISC USE AS DIRECTED 100 each 11   Blood Glucose Monitoring Suppl (ACCU-CHEK AVIVA) device by Other route. Use as instructed     diclofenac Sodium (VOLTAREN) 1 % GEL Apply 1 application topically 4 (four) times daily as needed (pain).     Dulaglutide (TRULICITY) 1.5 GY/1.7CB SOPN Inject 1.5 mg into the skin once a week. (Patient taking differently: Inject 1.5 mg into the skin once a week. Sundays) 6 mL 3   furosemide (LASIX) 20 MG tablet Take 1 tablet (20 mg total) by mouth daily. 7 tablet 0   gabapentin  (NEURONTIN) 300 MG capsule Take 2 capsules (600 mg total) by mouth 2 (two) times daily. 120 capsule 3   insulin NPH-regular Human (HUMULIN 70/30) (70-30) 100 UNIT/ML injection Inject 25 Units into the skin 2 (two) times daily with a meal. Max daily up to 90 mL 90 mL 3   Insulin Pen Needle 32G X 4 MM MISC 1 Device by Does not apply route in the morning and at bedtime. 200 each 3   Insulin Syringes, Disposable, U-100 1 ML MISC 1 Device by Does not apply route as directed. 100 each 11   lovastatin (MEVACOR) 40 MG tablet Take 1 tablet (40 mg total) by mouth at bedtime. 90 tablet 1   montelukast (SINGULAIR) 10 MG tablet Take 1 tablet (10 mg total) by mouth at bedtime. 90 tablet 1   sodium bicarbonate 650 MG tablet Take 1 tablet (650 mg total) by mouth 2 (two) times daily. 30 tablet 0   warfarin (COUMADIN) 4 MG tablet Take 1 tablet (4 mg total) by mouth daily. 30 tablet 0   cefadroxil (DURICEF) 500 MG capsule Take one daily (Patient not taking: Reported on 01/11/2022) 10 capsule 0   methocarbamol (ROBAXIN) 500 MG tablet Take 1 tablet (500 mg total) by mouth 2 (two) times daily as needed. To be taken after surgery (Patient not taking: Reported on 01/11/2022) 20 tablet 2   No facility-administered medications prior to visit.    No Known Allergies  ROS Review of Systems  Constitutional:  Negative for diaphoresis, fatigue, fever and unexpected weight change.  Respiratory: Negative.    Cardiovascular: Negative.   Gastrointestinal: Negative.   Skin:  Positive for color change and wound.  Neurological:  Negative for speech difficulty and weakness.  Psychiatric/Behavioral: Negative.       Objective:    Physical Exam Vitals and nursing note reviewed.  Constitutional:      General: He is not in acute distress.    Appearance: Normal appearance. He is not ill-appearing, toxic-appearing or diaphoretic.  HENT:     Head: Normocephalic and atraumatic.  Eyes:     General: No scleral icterus.       Right  eye: No discharge.        Left eye: No discharge.     Extraocular Movements: Extraocular movements intact.     Conjunctiva/sclera: Conjunctivae normal.  Cardiovascular:     Rate and Rhythm: Normal rate and regular rhythm.  Pulmonary:     Effort: Pulmonary effort is normal.     Breath sounds: Normal breath sounds.  Skin:    General: Skin is warm and dry.       Neurological:     Mental Status: He is alert and oriented to person, place, and time.  Psychiatric:  Mood and Affect: Mood normal.        Behavior: Behavior normal.    BP 130/80 (BP Location: Left Arm, Patient Position: Sitting, Cuff Size: Normal)    Pulse 77    Temp (!) 96.5 F (35.8 C) (Temporal)    Ht '6\' 1"'$  (1.854 m)    Wt 230 lb 12.8 oz (104.7 kg)    SpO2 98%    BMI 30.45 kg/m  Wt Readings from Last 3 Encounters:  01/11/22 230 lb 12.8 oz (104.7 kg)  12/28/21 231 lb 6.4 oz (105 kg)  12/19/21 241 lb 6.4 oz (109.5 kg)     Health Maintenance Due  Topic Date Due   Hepatitis C Screening  Never done   URINE MICROALBUMIN  08/28/2021    There are no preventive care reminders to display for this patient.  Lab Results  Component Value Date   TSH 1.344 02/09/2020   Lab Results  Component Value Date   WBC 9.7 12/19/2021   HGB 11.0 (L) 12/19/2021   HCT 33.6 (L) 12/19/2021   MCV 91.9 12/19/2021   PLT 338.0 12/19/2021   Lab Results  Component Value Date   NA 135 12/19/2021   K 4.8 12/19/2021   CO2 25 12/19/2021   GLUCOSE 160 (H) 12/19/2021   BUN 31 (H) 12/19/2021   CREATININE 1.84 (H) 12/19/2021   BILITOT 0.7 12/06/2021   ALKPHOS 50 12/06/2021   AST 21 12/06/2021   ALT 21 12/06/2021   PROT 6.9 12/06/2021   ALBUMIN 2.7 (L) 12/09/2021   CALCIUM 8.5 12/19/2021   ANIONGAP 7 12/13/2021   GFR 37.45 (L) 12/19/2021   Lab Results  Component Value Date   CHOL 112 01/01/2022   Lab Results  Component Value Date   HDL 34.50 (L) 01/01/2022   Lab Results  Component Value Date   LDLCALC 55 01/01/2022    Lab Results  Component Value Date   TRIG 114.0 01/01/2022   Lab Results  Component Value Date   CHOLHDL 3 01/01/2022   Lab Results  Component Value Date   HGBA1C 6.4 (H) 12/06/2021      Assessment & Plan:   Problem List Items Addressed This Visit       Other   Cellulitis of right lower extremity - Primary    No orders of the defined types were placed in this encounter.   Follow-up: Return Return in August for scheduled routine follow-up.[name], MD

## 2022-01-15 DIAGNOSIS — I129 Hypertensive chronic kidney disease with stage 1 through stage 4 chronic kidney disease, or unspecified chronic kidney disease: Secondary | ICD-10-CM | POA: Diagnosis not present

## 2022-01-15 DIAGNOSIS — E785 Hyperlipidemia, unspecified: Secondary | ICD-10-CM | POA: Diagnosis not present

## 2022-01-15 DIAGNOSIS — Z86711 Personal history of pulmonary embolism: Secondary | ICD-10-CM | POA: Diagnosis not present

## 2022-01-15 DIAGNOSIS — Z7901 Long term (current) use of anticoagulants: Secondary | ICD-10-CM | POA: Diagnosis not present

## 2022-01-15 DIAGNOSIS — N1832 Chronic kidney disease, stage 3b: Secondary | ICD-10-CM | POA: Diagnosis not present

## 2022-01-15 DIAGNOSIS — N179 Acute kidney failure, unspecified: Secondary | ICD-10-CM | POA: Diagnosis not present

## 2022-01-15 DIAGNOSIS — Z86718 Personal history of other venous thrombosis and embolism: Secondary | ICD-10-CM | POA: Diagnosis not present

## 2022-01-15 DIAGNOSIS — E1122 Type 2 diabetes mellitus with diabetic chronic kidney disease: Secondary | ICD-10-CM | POA: Diagnosis not present

## 2022-01-15 DIAGNOSIS — Z7985 Long-term (current) use of injectable non-insulin antidiabetic drugs: Secondary | ICD-10-CM | POA: Diagnosis not present

## 2022-01-15 DIAGNOSIS — G4730 Sleep apnea, unspecified: Secondary | ICD-10-CM | POA: Diagnosis not present

## 2022-01-15 DIAGNOSIS — L03115 Cellulitis of right lower limb: Secondary | ICD-10-CM | POA: Diagnosis not present

## 2022-01-15 DIAGNOSIS — M1712 Unilateral primary osteoarthritis, left knee: Secondary | ICD-10-CM | POA: Diagnosis not present

## 2022-01-15 DIAGNOSIS — M545 Low back pain, unspecified: Secondary | ICD-10-CM | POA: Diagnosis not present

## 2022-01-15 DIAGNOSIS — Z794 Long term (current) use of insulin: Secondary | ICD-10-CM | POA: Diagnosis not present

## 2022-01-16 ENCOUNTER — Telehealth: Payer: Self-pay | Admitting: Family Medicine

## 2022-01-16 DIAGNOSIS — M545 Low back pain, unspecified: Secondary | ICD-10-CM | POA: Diagnosis not present

## 2022-01-16 DIAGNOSIS — L03115 Cellulitis of right lower limb: Secondary | ICD-10-CM | POA: Diagnosis not present

## 2022-01-16 DIAGNOSIS — N179 Acute kidney failure, unspecified: Secondary | ICD-10-CM | POA: Diagnosis not present

## 2022-01-16 DIAGNOSIS — G4730 Sleep apnea, unspecified: Secondary | ICD-10-CM | POA: Diagnosis not present

## 2022-01-16 DIAGNOSIS — I129 Hypertensive chronic kidney disease with stage 1 through stage 4 chronic kidney disease, or unspecified chronic kidney disease: Secondary | ICD-10-CM | POA: Diagnosis not present

## 2022-01-16 DIAGNOSIS — Z7901 Long term (current) use of anticoagulants: Secondary | ICD-10-CM | POA: Diagnosis not present

## 2022-01-16 DIAGNOSIS — E1122 Type 2 diabetes mellitus with diabetic chronic kidney disease: Secondary | ICD-10-CM | POA: Diagnosis not present

## 2022-01-16 DIAGNOSIS — N1832 Chronic kidney disease, stage 3b: Secondary | ICD-10-CM | POA: Diagnosis not present

## 2022-01-16 DIAGNOSIS — Z86711 Personal history of pulmonary embolism: Secondary | ICD-10-CM | POA: Diagnosis not present

## 2022-01-16 DIAGNOSIS — Z794 Long term (current) use of insulin: Secondary | ICD-10-CM | POA: Diagnosis not present

## 2022-01-16 DIAGNOSIS — E785 Hyperlipidemia, unspecified: Secondary | ICD-10-CM | POA: Diagnosis not present

## 2022-01-16 DIAGNOSIS — M1712 Unilateral primary osteoarthritis, left knee: Secondary | ICD-10-CM | POA: Diagnosis not present

## 2022-01-16 DIAGNOSIS — Z86718 Personal history of other venous thrombosis and embolism: Secondary | ICD-10-CM | POA: Diagnosis not present

## 2022-01-16 DIAGNOSIS — Z7985 Long-term (current) use of injectable non-insulin antidiabetic drugs: Secondary | ICD-10-CM | POA: Diagnosis not present

## 2022-01-16 NOTE — Telephone Encounter (Signed)
Adrian faxed over certification paperwork to be filled out and faxed back over. Someone put it in the chart on 01/01/22 and I printed it 01/16/22. [facility] called and said there should be 2 other forms that need to be filled out and faxed over, she refaxed them today 01/16/22. I have placed in Dr Bebe Shaggy folder upfront.  ?

## 2022-01-16 NOTE — Telephone Encounter (Signed)
Written order signed and faxed  ?

## 2022-01-17 NOTE — Telephone Encounter (Signed)
Forms received pending to be viewed and signed by Provider.  ?

## 2022-01-18 ENCOUNTER — Encounter: Payer: Self-pay | Admitting: Family Medicine

## 2022-01-18 DIAGNOSIS — M1712 Unilateral primary osteoarthritis, left knee: Secondary | ICD-10-CM | POA: Diagnosis not present

## 2022-01-18 DIAGNOSIS — Z794 Long term (current) use of insulin: Secondary | ICD-10-CM | POA: Diagnosis not present

## 2022-01-18 DIAGNOSIS — N1832 Chronic kidney disease, stage 3b: Secondary | ICD-10-CM | POA: Diagnosis not present

## 2022-01-18 DIAGNOSIS — Z86718 Personal history of other venous thrombosis and embolism: Secondary | ICD-10-CM | POA: Diagnosis not present

## 2022-01-18 DIAGNOSIS — E785 Hyperlipidemia, unspecified: Secondary | ICD-10-CM | POA: Diagnosis not present

## 2022-01-18 DIAGNOSIS — Z7901 Long term (current) use of anticoagulants: Secondary | ICD-10-CM | POA: Diagnosis not present

## 2022-01-18 DIAGNOSIS — Z86711 Personal history of pulmonary embolism: Secondary | ICD-10-CM | POA: Diagnosis not present

## 2022-01-18 DIAGNOSIS — G4730 Sleep apnea, unspecified: Secondary | ICD-10-CM | POA: Diagnosis not present

## 2022-01-18 DIAGNOSIS — N179 Acute kidney failure, unspecified: Secondary | ICD-10-CM | POA: Diagnosis not present

## 2022-01-18 DIAGNOSIS — E1122 Type 2 diabetes mellitus with diabetic chronic kidney disease: Secondary | ICD-10-CM | POA: Diagnosis not present

## 2022-01-18 DIAGNOSIS — L03115 Cellulitis of right lower limb: Secondary | ICD-10-CM | POA: Diagnosis not present

## 2022-01-18 DIAGNOSIS — I129 Hypertensive chronic kidney disease with stage 1 through stage 4 chronic kidney disease, or unspecified chronic kidney disease: Secondary | ICD-10-CM | POA: Diagnosis not present

## 2022-01-18 DIAGNOSIS — M545 Low back pain, unspecified: Secondary | ICD-10-CM | POA: Diagnosis not present

## 2022-01-18 DIAGNOSIS — Z7985 Long-term (current) use of injectable non-insulin antidiabetic drugs: Secondary | ICD-10-CM | POA: Diagnosis not present

## 2022-01-18 MED ORDER — WARFARIN SODIUM 4 MG PO TABS: 4.0000 mg | ORAL_TABLET | Freq: Every day | ORAL | 0 refills | Status: DC

## 2022-01-21 DIAGNOSIS — Z86711 Personal history of pulmonary embolism: Secondary | ICD-10-CM | POA: Diagnosis not present

## 2022-01-21 DIAGNOSIS — E785 Hyperlipidemia, unspecified: Secondary | ICD-10-CM | POA: Diagnosis not present

## 2022-01-21 DIAGNOSIS — M545 Low back pain, unspecified: Secondary | ICD-10-CM | POA: Diagnosis not present

## 2022-01-21 DIAGNOSIS — Z794 Long term (current) use of insulin: Secondary | ICD-10-CM | POA: Diagnosis not present

## 2022-01-21 DIAGNOSIS — L03115 Cellulitis of right lower limb: Secondary | ICD-10-CM | POA: Diagnosis not present

## 2022-01-21 DIAGNOSIS — Z86718 Personal history of other venous thrombosis and embolism: Secondary | ICD-10-CM | POA: Diagnosis not present

## 2022-01-21 DIAGNOSIS — E1122 Type 2 diabetes mellitus with diabetic chronic kidney disease: Secondary | ICD-10-CM | POA: Diagnosis not present

## 2022-01-21 DIAGNOSIS — G4730 Sleep apnea, unspecified: Secondary | ICD-10-CM | POA: Diagnosis not present

## 2022-01-21 DIAGNOSIS — N179 Acute kidney failure, unspecified: Secondary | ICD-10-CM | POA: Diagnosis not present

## 2022-01-21 DIAGNOSIS — I129 Hypertensive chronic kidney disease with stage 1 through stage 4 chronic kidney disease, or unspecified chronic kidney disease: Secondary | ICD-10-CM | POA: Diagnosis not present

## 2022-01-21 DIAGNOSIS — Z7985 Long-term (current) use of injectable non-insulin antidiabetic drugs: Secondary | ICD-10-CM | POA: Diagnosis not present

## 2022-01-21 DIAGNOSIS — Z7901 Long term (current) use of anticoagulants: Secondary | ICD-10-CM | POA: Diagnosis not present

## 2022-01-21 DIAGNOSIS — M1712 Unilateral primary osteoarthritis, left knee: Secondary | ICD-10-CM | POA: Diagnosis not present

## 2022-01-21 DIAGNOSIS — N1832 Chronic kidney disease, stage 3b: Secondary | ICD-10-CM | POA: Diagnosis not present

## 2022-01-23 ENCOUNTER — Other Ambulatory Visit: Payer: Self-pay

## 2022-01-23 ENCOUNTER — Encounter (HOSPITAL_BASED_OUTPATIENT_CLINIC_OR_DEPARTMENT_OTHER): Payer: Medicare Other | Admitting: Physician Assistant

## 2022-01-23 DIAGNOSIS — Z7901 Long term (current) use of anticoagulants: Secondary | ICD-10-CM | POA: Diagnosis not present

## 2022-01-23 DIAGNOSIS — Z86711 Personal history of pulmonary embolism: Secondary | ICD-10-CM | POA: Diagnosis not present

## 2022-01-23 DIAGNOSIS — L03115 Cellulitis of right lower limb: Secondary | ICD-10-CM | POA: Diagnosis not present

## 2022-01-23 DIAGNOSIS — N1832 Chronic kidney disease, stage 3b: Secondary | ICD-10-CM | POA: Diagnosis not present

## 2022-01-23 DIAGNOSIS — I129 Hypertensive chronic kidney disease with stage 1 through stage 4 chronic kidney disease, or unspecified chronic kidney disease: Secondary | ICD-10-CM | POA: Diagnosis not present

## 2022-01-23 DIAGNOSIS — L97212 Non-pressure chronic ulcer of right calf with fat layer exposed: Secondary | ICD-10-CM | POA: Diagnosis not present

## 2022-01-23 DIAGNOSIS — Z86718 Personal history of other venous thrombosis and embolism: Secondary | ICD-10-CM | POA: Diagnosis not present

## 2022-01-23 DIAGNOSIS — E1122 Type 2 diabetes mellitus with diabetic chronic kidney disease: Secondary | ICD-10-CM | POA: Diagnosis not present

## 2022-01-23 DIAGNOSIS — N179 Acute kidney failure, unspecified: Secondary | ICD-10-CM | POA: Diagnosis not present

## 2022-01-23 DIAGNOSIS — E785 Hyperlipidemia, unspecified: Secondary | ICD-10-CM | POA: Diagnosis not present

## 2022-01-23 DIAGNOSIS — M545 Low back pain, unspecified: Secondary | ICD-10-CM | POA: Diagnosis not present

## 2022-01-23 DIAGNOSIS — G4730 Sleep apnea, unspecified: Secondary | ICD-10-CM | POA: Diagnosis not present

## 2022-01-23 DIAGNOSIS — L97812 Non-pressure chronic ulcer of other part of right lower leg with fat layer exposed: Secondary | ICD-10-CM | POA: Diagnosis not present

## 2022-01-23 DIAGNOSIS — Z7985 Long-term (current) use of injectable non-insulin antidiabetic drugs: Secondary | ICD-10-CM | POA: Diagnosis not present

## 2022-01-23 DIAGNOSIS — I890 Lymphedema, not elsewhere classified: Secondary | ICD-10-CM | POA: Diagnosis not present

## 2022-01-23 DIAGNOSIS — Z794 Long term (current) use of insulin: Secondary | ICD-10-CM | POA: Diagnosis not present

## 2022-01-23 DIAGNOSIS — I872 Venous insufficiency (chronic) (peripheral): Secondary | ICD-10-CM | POA: Diagnosis not present

## 2022-01-23 DIAGNOSIS — E11622 Type 2 diabetes mellitus with other skin ulcer: Secondary | ICD-10-CM | POA: Diagnosis not present

## 2022-01-23 DIAGNOSIS — I739 Peripheral vascular disease, unspecified: Secondary | ICD-10-CM

## 2022-01-23 DIAGNOSIS — M1712 Unilateral primary osteoarthritis, left knee: Secondary | ICD-10-CM | POA: Diagnosis not present

## 2022-01-23 NOTE — Progress Notes (Addendum)
[place], Emanuele ([id number]) ?Visit Report for 01/23/2022 ?Chief Complaint Document Details ?Patient Name: Date of Service: ?[name], [name] 01/23/2022 8:15 A M ?Medical Record Number: [id number] ?Patient Account Number: [id number] ?Date of Birth/Sex: Treating RN: ?10-Nov-1955 (67 y.o. M) Rolin Barry, Bobbi ?Primary Care Provider: Abelino Derrick Other Clinician: ?Referring Provider: ?Treating Provider/Extender: Worthy Keeler ?Abelino Derrick ?Weeks in Treatment: 2 ?Information Obtained from: Patient ?Chief Complaint ?Right LE Ulcer ?Electronic Signature(s) ?Signed: 01/23/2022 8:20:03 AM By: Worthy Keeler PA-C ?Entered By: Worthy Keeler on 01/23/2022 08:20:02 ?-------------------------------------------------------------------------------- ?HPI Details ?Patient Name: Date of Service: ?[name], [name] 01/23/2022 8:15 A M ?Medical Record Number: [id number] ?Patient Account Number: [id number] ?Date of Birth/Sex: Treating RN: ?April 15, 1956 (66 y.o. M) Rolin Barry, Bobbi ?Primary Care Provider: Abelino Derrick Other Clinician: ?Referring Provider: ?Treating Provider/Extender: Worthy Keeler ?Abelino Derrick ?Weeks in Treatment: 2 ?History of Present Illness ?HPI Description: 01/09/2022 upon evaluation today patient appears to be doing well with regard to his leg compared to where things have been. I did review his ?note and it appears he had significant cellulitis. He was actually in the hospital due to sepsis from February 19 February 13. The good news is he recovered ?from that he was placed on Duricef for a time and then this was after the Keflex. The Keflex was really not effective. Eventually he was transitioned to ?Bactrim which is what he has been taking currently he tells me when they switch to that is when things really got better. That was due to a culture which showed ?Enterobacter as the main organism with Klebsiella being a secondary. Nonetheless in the end I do feel like that the Bactrim has done a good job for him based ?on what he  is telling me and what I am seeing. He did have a CT scan which was on 12/07/2021 which showed cellulitis fortunately nothing deeper structure was ?infected and no abscess. He also had a DVT study on 12/07/2020 which was negative. On 2/24 is when the culture was performed. His most recent hemoglobin ?A1c was 6.4 according to his primary care provider and the patient currently is on the Bactrim for a couple more days along with an Unna boot and alginate ?which appears to been the order although what he had was Curlex and Coban on when he came in today with alginate. 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 ?01/23/2022 upon evaluation today patient appears to be doing excellent in regard to his leg ulcer. This is significantly improved compared to his first visit. ?Overall both he and I are both extremely pleased with where things stand today his pain is better the infection seems to be cleared and overall I think you are ?on the right path. ?Electronic Signature(s) ?Signed: 01/23/2022 8:25:51 AM By: Worthy Keeler PA-C ?Entered By: Worthy Keeler on 01/23/2022 08:25:51 ?-------------------------------------------------------------------------------- ?Physical Exam Details ?Patient Name: Date of Service: ?[name], [name] 01/23/2022 8:15 A M ?Medical Record Number: [id number] ?Patient Account Number: [id number] ?Date of Birth/Sex: Treating RN: ?24-Jun-1956 (66 y.o. M) Rolin Barry, Bobbi ?Primary Care Provider: Abelino Derrick Other Clinician: ?Referring Provider: ?Treating Provider/Extender: Worthy Keeler ?Abelino Derrick ?Weeks in Treatment: 2 ?Constitutional ?Well-nourished and well-hydrated in no acute distress. ?Respiratory ?normal breathing without difficulty. ?Psychiatric ?this patient is able to make decisions and demonstrates good insight into disease process. Alert and Oriented x 3. pleasant  and cooperative. ?Notes ?Patient's wound did not require any sharp  debridement overall it looks to be doing quite well today and I am very pleased in that regard. I do not see any ?evidence of active infection locally or systemically which is great news. ?Electronic Signature(s) ?Signed: 01/23/2022 8:26:05 AM By: Worthy Keeler PA-C ?Entered By: Worthy Keeler on 01/23/2022 08:26:05 ?-------------------------------------------------------------------------------- ?Physician Orders Details ?Patient Name: Date of Service: ?[name], [name] 01/23/2022 8:15 A M ?Medical Record Number: [id number] ?Patient Account Number: [id number] ?Date of Birth/Sex: Treating RN: ?1955/11/14 (66 y.o. M) Rolin Barry, Bobbi ?Primary Care Provider: Abelino Derrick Other Clinician: ?Referring Provider: ?Treating Provider/Extender: Worthy Keeler ?Abelino Derrick ?Weeks in Treatment: 2 ?Verbal / Phone Orders: No ?Diagnosis Coding ?ICD-10 Coding ?Code Description ?I87.2 Venous insufficiency (chronic) (peripheral) ?I89.0 Lymphedema, not elsewhere classified ?E11.622 Type 2 diabetes mellitus with other skin ulcer ?F57.322 Non-pressure chronic ulcer of other part of right lower leg with fat layer exposed ?Z79.01 Long term (current) use of anticoagulants ?G25.427 Personal history of other venous thrombosis and embolism ?Follow-up Appointments ?ppointment in 1 week. Margarita Grizzle and [place] Room 8 3/29/223 Wednesday 815am. ?Return A ?Other: - Complete the oral antibiotics. ?Edema Control - Lymphedema / SCD / Other ?Elevate legs to the level of the heart or above for 30 minutes daily and/or when sitting, a frequency of: - 3-4 times a day throughout the day. ?Avoid standing for long periods of time. ?Exercise regularly ?Moisturize legs daily. - left leg every night before bed. ?Home Health ?New wound care orders this week; continue Home Health for wound care. May utilize formulary equivalent dressing for wound treatment ?orders unless otherwise specified. - week of  01/21/22 and week of 01/30/2022 ***2 times that week home health to change- Monday and Friday. ?Wound Center Wednesday. ?Other Home Health Orders/Instructions: - [facility] home [facility] ?Wound Treatment ?Wound #1 - Lower Leg Wound Laterality: Right, Posterior ?Cleanser: Soap and Water (Home Health) 3 x Per Week/30 Days ?Discharge Instructions: May shower and wash wound with dial antibacterial soap and water prior to dressing change. ?Cleanser: Wound Cleanser (Home Health) 3 x Per Week/30 Days ?Discharge Instructions: Cleanse the wound with wound cleanser prior to applying a clean dressing using gauze sponges, not tissue or cotton balls. ?Peri-Wound Care: Triamcinolone 15 (g) (Home Health) 3 x Per Week/30 Days ?Discharge Instructions: Mix with lotion. ?Peri-Wound Care: Sween Lotion (Moisturizing lotion) (Home Health) 3 x Per Week/30 Days ?Discharge Instructions: Apply moisturizing lotion as directed ?Prim Dressing: KerraCel Ag Gelling Fiber Dressing, 4x5 in (silver alginate) (Home Health) 3 x Per Week/30 Days ?ary ?Discharge Instructions: Apply silver alginate to wound bed as instructed ?Secondary Dressing: ABD Pad, 8x10 (Home Health) 3 x Per Week/30 Days ?Discharge Instructions: Apply over primary dressing as directed. ?Compression Wrap: ThreePress (3 layer compression wrap) (Home Health) 3 x Per Week/30 Days ?Discharge Instructions: Apply three layer compression as directed. ?Electronic Signature(s) ?Signed: 01/23/2022 5:24:10 PM By: Worthy Keeler PA-C ?Signed: 01/23/2022 5:40:22 PM By: Deon Pilling RN, BSN ?Entered By: Deon Pilling on 01/23/2022 08:26:01 ?-------------------------------------------------------------------------------- ?Problem List Details ?Patient Name: ?Date of Service: ?[name], [name] 01/23/2022 8:15 A M ?Medical Record Number: [id number] ?Patient Account Number: [id number] ?Date of Birth/Sex: ?Treating RN: ?Nov 09, 1955 (66 y.o. M) Rolin Barry, Bobbi ?Primary Care Provider: Abelino Derrick ?Other  Clinician: ?Referring Provider: ?Treating Provider/Extender: Worthy Keeler ?Abelino Derrick ?Weeks in Treatment: 2 ?Active Problems ?ICD-10 ?Encounter ?Code Description Active Date MDM ?Diagnosis ?I87.2 Venous insuf

## 2022-01-23 NOTE — Progress Notes (Signed)
[place], [place] ([id number]) ?Visit Report for 01/23/2022 ?Arrival Information Details ?Patient Name: Date of Service: ?[name], [name] 01/23/2022 8:15 A M ?Medical Record Number: [id number] ?Patient Account Number: [id number] ?Date of Birth/Sex: Treating RN: ?[birth year] (66 y.o. M) Rolin Barry, Bobbi ?Primary Care Patches Mcdonnell: Abelino Derrick Other Clinician: ?Referring Maely Clements: ?Treating Adonis Ryther/Extender: Worthy Keeler ?Abelino Derrick ?Weeks in Treatment: 2 ?Visit Information History Since Last Visit ?Added or deleted any medications: No ?Patient Arrived: [name] ?Any new allergies or adverse reactions: No ?Arrival Time: 08:01 ?Had a fall or experienced change in No ?Accompanied By: self ?activities of daily living that may affect ?Transfer Assistance: None ?risk of falls: ?Patient Identification Verified: Yes ?Signs or symptoms of abuse/neglect since last visito No ?Secondary Verification Process Completed: Yes ?Hospitalized since last visit: No ?Patient Requires Transmission-Based Precautions: No ?Implantable device outside of the clinic excluding No ?Patient Has Alerts: Yes ?cellular tissue based products placed in the center ?Patient Alerts: Patient on Blood Thinner since last visit: ?Has Dressing in Place as Prescribed: Yes ?Has Compression in Place as Prescribed: No ?Pain Present Now: No ?Notes ?patient did not have a 3 layer compression wrap on, it was an Haematologist placed by home health. Devin Foskey made aware. ?Electronic Signature(s) ?Signed: 01/23/2022 5:40:22 PM By: Deon Pilling RN, BSN ?Entered By: Deon Pilling on 01/23/2022 08:18:54 ?-------------------------------------------------------------------------------- ?Compression Therapy Details ?Patient Name: Date of Service: ?MACLIN, Heyward 01/23/2022 8:15 A M ?Medical Record Number: [id number] ?Patient Account Number: [id number] ?Date of Birth/Sex: Treating RN: ?February 02, 1956 (66 y.o. M) Rolin Barry, Bobbi ?Primary Care Vernisha Bacote: Abelino Derrick Other Clinician: ?Referring  Jerard Bays: ?Treating Kaitlin Ardito/Extender: Worthy Keeler ?Abelino Derrick ?Weeks in Treatment: 2 ?Compression Therapy Performed for Wound Assessment: Wound #1 Right,Posterior Lower Leg ?Performed By: Clinician Deon Pilling, RN ?Compression Type: Three Layer ?Post Procedure Diagnosis ?Same as Pre-procedure ?Electronic Signature(s) ?Signed: 01/23/2022 5:40:22 PM By: Deon Pilling RN, BSN ?Entered By: Deon Pilling on 01/23/2022 08:22:56 ?-------------------------------------------------------------------------------- ?Encounter Discharge Information Details ?Patient Name: ?Date of Service: ?[name], [name] 01/23/2022 8:15 A M ?Medical Record Number: [id number] ?Patient Account Number: [id number] ?Date of Birth/Sex: ?Treating RN: ?1956/02/09 (66 y.o. M) Rolin Barry, Bobbi ?Primary Care Bridie Colquhoun: Abelino Derrick ?Other Clinician: ?Referring Glora Hulgan: ?Treating Clee Pandit/Extender: Worthy Keeler ?Abelino Derrick ?Weeks in Treatment: 2 ?Encounter Discharge Information Items ?Discharge Condition: Stable ?Ambulatory Status: [name] ?Discharge Destination: Home ?Transportation: Private Auto ?Accompanied By: self ?Schedule Follow-up Appointment: Yes ?Clinical Summary of Care: ?Electronic Signature(s) ?Signed: 01/23/2022 5:40:22 PM By: Deon Pilling RN, BSN ?Entered By: Deon Pilling on 01/23/2022 08:26:55 ?-------------------------------------------------------------------------------- ?Lower Extremity Assessment Details ?Patient Name: ?Date of Service: ?[name], [name] 01/23/2022 8:15 A M ?Medical Record Number: [id number] ?Patient Account Number: [id number] ?Date of Birth/Sex: ?Treating RN: ?04-20-1956 (66 y.o. M) Rolin Barry, Bobbi ?Primary Care Shawnice Tilmon: Abelino Derrick ?Other Clinician: ?Referring Shilo Pauwels: ?Treating Callen Vancuren/Extender: Worthy Keeler ?Abelino Derrick ?Weeks in Treatment: 2 ?Edema Assessment ?Assessed: [Left: No] [Right: Yes] ?Edema: [Left: Ye] [Right: s] ?Calf ?Left: Right: ?Point of Measurement: 39 cm From Medial Instep 40  cm ?Ankle ?Left: Right: ?Point of Measurement: 15 cm From Medial Instep 26.5 cm ?Vascular Assessment ?Pulses: ?Dorsalis Pedis ?Palpable: [Right:Yes] ?Electronic Signature(s) ?Signed: 01/23/2022 5:40:22 PM By: Deon Pilling RN, BSN ?Entered By: Deon Pilling on 01/23/2022 08:09:13 ?-------------------------------------------------------------------------------- ?Multi-Disciplinary Care Plan Details ?Patient Name: ?Date of Service: ?[name], [name] 01/23/2022 8:15 A M ?Medical Record Number: [id number] ?Patient Account Number: [id number] ?Date of Birth/Sex: ?Treating RN: ?08-10-1956 (66 y.o. M) Rolin Barry, Bobbi ?Primary Care Rashidi Loh: Abelino Derrick ?Other Clinician: ?Referring Mansi Tokar: ?Treating Bohdi Leeds/Extender: Worthy Keeler ?Abelino Derrick ?Weeks in Treatment: 2 ?Active  Inactive ?Nutrition ?Nursing Diagnoses: ?Potential for alteratiion in Nutrition/Potential for imbalanced nutrition ?Goals: ?Patient/caregiver agrees to and verbalizes understanding of need to obtain nutritional consultation ?Date Initiated: 01/09/2022 ?T arget Resolution Date: 02/07/2022 ?Goal Status: Active ?Interventions: ?Assess HgA1c results as ordered upon admission and as needed ?Provide education on elevated blood sugars and impact on wound healing ?Provide education on nutrition ?Treatment Activities: ?Obtain HgA1c : 01/09/2022 ?Notes: ?Pain, Acute or Chronic ?Nursing Diagnoses: ?Pain, acute or chronic: actual or potential ?Potential alteration in comfort, pain ?Goals: ?Patient will verbalize adequate pain control and receive pain control interventions during procedures as needed ?Date Initiated: 01/09/2022 ?Target Resolution Date: 02/08/2022 ?Goal Status: Active ?Patient/caregiver will verbalize comfort level met ?Date Initiated: 01/09/2022 ?Target Resolution Date: 02/07/2022 ?Goal Status: Active ?Interventions: ?Encourage patient to take pain medications as prescribed ?Provide education on pain management ?Reposition patient for comfort ?Treatment  Activities: ?Administer pain control measures as ordered : 01/09/2022 ?Notes: ?Wound/Skin Impairment ?Nursing Diagnoses: ?Knowledge deficit related to ulceration/compromised skin integrity ?Goals: ?Patient/caregiver will verbalize understanding of skin care regimen ?Date Initiated: 01/09/2022 ?Target Resolution Date: 02/07/2022 ?Goal Status: Active ?Interventions: ?Assess patient/caregiver ability to perform ulcer/skin care regimen upon admission and as needed ?Assess ulceration(s) every visit ?Provide education on ulcer and skin care ?Treatment Activities: ?Skin care regimen initiated : 01/09/2022 ?Topical wound management initiated : 01/09/2022 ?Notes: ?Electronic Signature(s) ?Signed: 01/23/2022 5:40:22 PM By: Deon Pilling RN, BSN ?Entered By: Deon Pilling on 01/23/2022 08:10:37 ?-------------------------------------------------------------------------------- ?Pain Assessment Details ?Patient Name: ?Date of Service: ?SAM, Josaiah 01/23/2022 8:15 A M ?Medical Record Number: [id number] ?Patient Account Number: [id number] ?Date of Birth/Sex: ?Treating RN: ?1956/02/24 (66 y.o. M) Rolin Barry, Bobbi ?Primary Care Edelin Fryer: Abelino Derrick ?Other Clinician: ?Referring Hilberto Burzynski: ?Treating Sonji Starkes/Extender: Worthy Keeler ?Abelino Derrick ?Weeks in Treatment: 2 ?Active Problems ?Location of Pain Severity and Description of Pain ?Patient Has Paino No ?Site Locations ?Pain Management and Medication ?Current Pain Management: ?Medication: No ?Cold Application: No ?Rest: No ?Massage: No ?Activity: No ?T.E.N.S.: No ?Heat Application: No ?Leg drop or elevation: No ?Is the Current Pain Management Adequate: Adequate ?How does your wound impact your activities of daily livingo ?Sleep: No ?Bathing: No ?Appetite: No ?Relationship With Others: No ?Bladder Continence: No ?Emotions: No ?Bowel Continence: No ?Work: No ?Toileting: No ?Drive: No ?Dressing: No ?Hobbies: No ?Electronic Signature(s) ?Signed: 01/23/2022 5:40:22 PM By: Deon Pilling RN,  BSN ?Entered By: Deon Pilling on 01/23/2022 08:05:03 ?-------------------------------------------------------------------------------- ?Patient/Caregiver Education Details ?Patient Name: ?Date of Service: ?Melling, Rocco 3/22/2023andnbs

## 2022-01-24 ENCOUNTER — Encounter: Payer: Self-pay | Admitting: Vascular Surgery

## 2022-01-24 ENCOUNTER — Ambulatory Visit (HOSPITAL_COMMUNITY)
Admission: RE | Admit: 2022-01-24 | Discharge: 2022-01-24 | Disposition: A | Discharge status: Home / Self Care | Payer: Medicare Other | Source: Ambulatory Visit | Attending: Vascular Surgery | Admitting: Vascular Surgery

## 2022-01-24 ENCOUNTER — Ambulatory Visit (INDEPENDENT_AMBULATORY_CARE_PROVIDER_SITE_OTHER): Payer: Medicare Other | Admitting: Vascular Surgery

## 2022-01-24 VITALS — BP 140/70 | HR 87 | Temp 98.3°F | Resp 20 | Ht 73.0 in | Wt 233.4 lb

## 2022-01-24 DIAGNOSIS — L03115 Cellulitis of right lower limb: Secondary | ICD-10-CM | POA: Diagnosis not present

## 2022-01-24 DIAGNOSIS — I739 Peripheral vascular disease, unspecified: Secondary | ICD-10-CM

## 2022-01-24 DIAGNOSIS — I872 Venous insufficiency (chronic) (peripheral): Secondary | ICD-10-CM | POA: Diagnosis not present

## 2022-01-24 NOTE — Progress Notes (Signed)
? ?ASSESSMENT & PLAN  ? ?CHRONIC VENOUS INSUFFICIENCY: This patient has evidence of chronic venous insufficiency on exam with CEAP C6 venous disease (venous ulcer).  He will continue with compression dressings by the wound care center and aggressive wound care.  We have also discussed the importance of leg elevation and the proper positioning for this.  Once the wound is healed I think he should be fitted for knee-high stocking with a gradient of 20 to 30 mmHg.  I have also encouraged him to avoid prolonged sitting and standing.  We also discussed the importance of exercise specifically walking and water aerobics once the wound has healed.  In addition we discussed importance of maintaining healthy weight.  Fortunately he has no evidence of significant peripheral arterial disease.  I will see him back in 3 months and get formal venous reflux testing on the right as he may benefit from addressing any superficial venous reflux in order to lower his risk of future venous ulcers in the future. ? ?REASON FOR CONSULT:   ? ?Peripheral arterial disease.  The consult is requested by Dr. Abelino Derrick. ? ?HPI:  ? ?[name] is a 66 y.o. male who was referred for evaluation of peripheral arterial disease.  Patient developed some swelling in the right leg and subsequently developed wound on the posterior aspect of his right leg which is being treated by the wound care center.  He is getting compression therapy and local wound care.  He has a history of a pulmonary embolus in 2009 and has been on chronic Coumadin therapy since that time.  The best of his understanding he never was found to have a hypercoagulable condition.  Of note, he did have a venous duplex scan on 12/07/2021 which showed no evidence of DVT on the right.  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 ? ?He denies any history of claudication, rest pain, or nonhealing ulcers.  He is not a smoker.  His  risk factors for peripheral vascular disease include type 2 diabetes, hypertension, and hypercholesterolemia.  He denies any family history of premature cardiovascular disease. ? ?Past Medical History:  ?Diagnosis Date  ? Arthritis   ? Chronic kidney disease   ? reduced kidney function. Stage III  ? Diabetes mellitus without complication ([place])   ? DVT (deep venous thrombosis) ([place])   ? age indeterminate LLE DVT 02/09/20  ? Dyspnea   ? On exertion  ? History of kidney stones   ? History of pulmonary embolus (PE)   ? Hypertension   ? Lipidemia   ? Prostate enlargement   ? Pulmonary embolism ([place]) 2009  ? Sleep apnea   ? ? ?Family History  ?Problem Relation Age of Onset  ? Cancer Mother   ? Heart disease Father   ? ? ?SOCIAL HISTORY: ?Social History  ? ?Tobacco Use  ? Smoking status: Never  ? Smokeless tobacco: Never  ?Substance Use Topics  ? Alcohol use: Yes  ?  Alcohol/week: 1.0 standard drink  ?  Types: 1 Cans of beer per week  ?  Comment: social  ? ? ?No Known Allergies ? ?Current Outpatient Medications  ?Medication Sig Dispense Refill  ? ACCU-CHEK GUIDE test strip USE TO CHECK BLOOD SUGAR 3 TIMES DAILY.E11.65 100 strip 12  ? Accu-Chek Softclix Lancets lancets Use as instructed to check blood sugar 3 times daily Dx is E11.65 100 each 2  ? BD INSULIN SYRINGE U/F  31G X 5/16" 1 ML MISC USE AS DIRECTED 100 each 11  ? Blood Glucose Monitoring Suppl (ACCU-CHEK AVIVA) device by Other route. Use as instructed    ? diclofenac Sodium (VOLTAREN) 1 % GEL Apply 1 application topically 4 (four) times daily as needed (pain).    ? Dulaglutide (TRULICITY) 1.5 BW/6.2MB SOPN Inject 1.5 mg into the skin once a week. (Patient taking differently: Inject 1.5 mg into the skin once a week. Sundays) 6 mL 3  ? furosemide (LASIX) 20 MG tablet Take 1 tablet (20 mg total) by mouth daily. 7 tablet 0  ? gabapentin (NEURONTIN) 300 MG capsule Take 2 capsules (600 mg total) by mouth 2 (two) times daily. 120 capsule 3  ? insulin NPH-regular Human  (HUMULIN 70/30) (70-30) 100 UNIT/ML injection Inject 25 Units into the skin 2 (two) times daily with a meal. Max daily up to 90 mL 90 mL 3  ? Insulin Pen Needle 32G X 4 MM MISC 1 Device by Does not apply route in the morning and at bedtime. 200 each 3  ? Insulin Syringes, Disposable, U-100 1 ML MISC 1 Device by Does not apply route as directed. 100 each 11  ? lovastatin (MEVACOR) 40 MG tablet Take 1 tablet (40 mg total) by mouth at bedtime. 90 tablet 1  ? montelukast (SINGULAIR) 10 MG tablet Take 1 tablet (10 mg total) by mouth at bedtime. 90 tablet 1  ? sodium bicarbonate 650 MG tablet Take 1 tablet (650 mg total) by mouth 2 (two) times daily. 30 tablet 0  ? warfarin (COUMADIN) 4 MG tablet Take 1 tablet (4 mg total) by mouth daily. 30 tablet 0  ? cefadroxil (DURICEF) 500 MG capsule Take one daily (Patient not taking: Reported on 01/11/2022) 10 capsule 0  ? methocarbamol (ROBAXIN) 500 MG tablet Take 1 tablet (500 mg total) by mouth 2 (two) times daily as needed. To be taken after surgery (Patient not taking: Reported on 01/11/2022) 20 tablet 2  ? ?No current facility-administered medications for this visit.  ? ? ?REVIEW OF SYSTEMS:  ?'[X]'$  denotes positive finding, '[ ]'$  denotes negative finding ?Cardiac  Comments:  ?Chest pain or chest pressure:    ?Shortness of breath upon exertion:    ?Short of breath when lying flat:    ?Irregular heart rhythm:    ?    ?Vascular    ?Pain in calf, thigh, or hip brought on by ambulation:    ?Pain in feet at night that wakes you up from your sleep:     ?Blood clot in your veins:    ?Leg swelling:  x   ?    ?Pulmonary    ?Oxygen at home:    ?Productive cough:     ?Wheezing:     ?    ?Neurologic    ?Sudden weakness in arms or legs:     ?Sudden numbness in arms or legs:     ?Sudden onset of difficulty speaking or slurred speech:    ?Temporary loss of vision in one eye:     ?Problems with dizziness:     ?    ?Gastrointestinal    ?Blood in stool:     ?Vomited blood:     ?    ?Genitourinary     ?Burning when urinating:     ?Blood in urine:    ?    ?Psychiatric    ?Major depression:     ?    ?Hematologic    ?Bleeding problems:    ?  Problems with blood clotting too easily:    ?    ?Skin    ?Rashes or ulcers:    ?    ?Constitutional    ?Fever or chills:    ?- ? ?PHYSICAL EXAM:  ? ?Vitals:  ? 01/24/22 0909  ?BP: 140/70  ?Pulse: 87  ?Resp: 20  ?Temp: 98.3 ?F (36.8 ?C)  ?SpO2: 98%  ?Weight: 233 lb 6.4 oz (105.9 kg)  ?Height: '6\' 1"'$  (1.854 m)  ? ?Body mass index is 30.79 kg/m?. ?GENERAL: The patient is a well-nourished male, in no acute distress. The vital signs are documented above. ?CARDIAC: There is a regular rate and rhythm.  ?VASCULAR: I do not detect carotid bruits. ?He has palpable femoral and dorsalis pedis pulses. ?He has bilateral lower extremity swelling and hyperpigmentation. ? ? ? ? ?PULMONARY: There is good air exchange bilaterally without wheezing or rales. ?ABDOMEN: Soft and non-tender with normal pitched bowel sounds.  ?MUSCULOSKELETAL: There are no major deformities. ?NEUROLOGIC: No focal weakness or paresthesias are detected. ?SKIN: There are no ulcers or rashes noted. ?PSYCHIATRIC: The patient has a normal affect. ? ?DATA:   ? ?ARTERIAL DOPPLER STUDY: I have independently interpreted his arterial Doppler study today. ? ?On the right side there is a biphasic dorsalis pedis and posterior tibial signal.  ABIs greater than 1.  Toe pressures 132 mmHg. ? ?On the left side, there is a triphasic dorsalis pedis and posterior tibial signal.  ABIs greater than 1.  Toe pressures 141 mmHg. ? ?Deitra Mayo ?[facility] ?

## 2022-01-25 DIAGNOSIS — L03115 Cellulitis of right lower limb: Secondary | ICD-10-CM | POA: Diagnosis not present

## 2022-01-25 DIAGNOSIS — E785 Hyperlipidemia, unspecified: Secondary | ICD-10-CM | POA: Diagnosis not present

## 2022-01-25 DIAGNOSIS — Z7985 Long-term (current) use of injectable non-insulin antidiabetic drugs: Secondary | ICD-10-CM | POA: Diagnosis not present

## 2022-01-25 DIAGNOSIS — M545 Low back pain, unspecified: Secondary | ICD-10-CM | POA: Diagnosis not present

## 2022-01-25 DIAGNOSIS — Z86711 Personal history of pulmonary embolism: Secondary | ICD-10-CM | POA: Diagnosis not present

## 2022-01-25 DIAGNOSIS — N179 Acute kidney failure, unspecified: Secondary | ICD-10-CM | POA: Diagnosis not present

## 2022-01-25 DIAGNOSIS — N1832 Chronic kidney disease, stage 3b: Secondary | ICD-10-CM | POA: Diagnosis not present

## 2022-01-25 DIAGNOSIS — Z86718 Personal history of other venous thrombosis and embolism: Secondary | ICD-10-CM | POA: Diagnosis not present

## 2022-01-25 DIAGNOSIS — I129 Hypertensive chronic kidney disease with stage 1 through stage 4 chronic kidney disease, or unspecified chronic kidney disease: Secondary | ICD-10-CM | POA: Diagnosis not present

## 2022-01-25 DIAGNOSIS — E1122 Type 2 diabetes mellitus with diabetic chronic kidney disease: Secondary | ICD-10-CM | POA: Diagnosis not present

## 2022-01-25 DIAGNOSIS — G4730 Sleep apnea, unspecified: Secondary | ICD-10-CM | POA: Diagnosis not present

## 2022-01-25 DIAGNOSIS — Z7901 Long term (current) use of anticoagulants: Secondary | ICD-10-CM | POA: Diagnosis not present

## 2022-01-25 DIAGNOSIS — Z794 Long term (current) use of insulin: Secondary | ICD-10-CM | POA: Diagnosis not present

## 2022-01-25 DIAGNOSIS — M1712 Unilateral primary osteoarthritis, left knee: Secondary | ICD-10-CM | POA: Diagnosis not present

## 2022-01-29 ENCOUNTER — Other Ambulatory Visit: Payer: Self-pay

## 2022-01-29 ENCOUNTER — Encounter: Payer: Self-pay | Admitting: Orthopaedic Surgery

## 2022-01-29 ENCOUNTER — Ambulatory Visit (INDEPENDENT_AMBULATORY_CARE_PROVIDER_SITE_OTHER): Payer: Medicare Other

## 2022-01-29 ENCOUNTER — Ambulatory Visit (INDEPENDENT_AMBULATORY_CARE_PROVIDER_SITE_OTHER): Payer: Medicare Other | Admitting: Orthopaedic Surgery

## 2022-01-29 DIAGNOSIS — N179 Acute kidney failure, unspecified: Secondary | ICD-10-CM | POA: Diagnosis not present

## 2022-01-29 DIAGNOSIS — M1712 Unilateral primary osteoarthritis, left knee: Secondary | ICD-10-CM | POA: Diagnosis not present

## 2022-01-29 DIAGNOSIS — N1832 Chronic kidney disease, stage 3b: Secondary | ICD-10-CM | POA: Diagnosis not present

## 2022-01-29 DIAGNOSIS — Z86711 Personal history of pulmonary embolism: Secondary | ICD-10-CM | POA: Diagnosis not present

## 2022-01-29 DIAGNOSIS — G4730 Sleep apnea, unspecified: Secondary | ICD-10-CM | POA: Diagnosis not present

## 2022-01-29 DIAGNOSIS — Z96652 Presence of left artificial knee joint: Secondary | ICD-10-CM | POA: Diagnosis not present

## 2022-01-29 DIAGNOSIS — I129 Hypertensive chronic kidney disease with stage 1 through stage 4 chronic kidney disease, or unspecified chronic kidney disease: Secondary | ICD-10-CM | POA: Diagnosis not present

## 2022-01-29 DIAGNOSIS — E785 Hyperlipidemia, unspecified: Secondary | ICD-10-CM | POA: Diagnosis not present

## 2022-01-29 DIAGNOSIS — L03115 Cellulitis of right lower limb: Secondary | ICD-10-CM | POA: Diagnosis not present

## 2022-01-29 DIAGNOSIS — M545 Low back pain, unspecified: Secondary | ICD-10-CM | POA: Diagnosis not present

## 2022-01-29 DIAGNOSIS — E1122 Type 2 diabetes mellitus with diabetic chronic kidney disease: Secondary | ICD-10-CM | POA: Diagnosis not present

## 2022-01-29 DIAGNOSIS — Z7985 Long-term (current) use of injectable non-insulin antidiabetic drugs: Secondary | ICD-10-CM | POA: Diagnosis not present

## 2022-01-29 DIAGNOSIS — Z7901 Long term (current) use of anticoagulants: Secondary | ICD-10-CM | POA: Diagnosis not present

## 2022-01-29 DIAGNOSIS — Z86718 Personal history of other venous thrombosis and embolism: Secondary | ICD-10-CM | POA: Diagnosis not present

## 2022-01-29 DIAGNOSIS — Z794 Long term (current) use of insulin: Secondary | ICD-10-CM | POA: Diagnosis not present

## 2022-01-29 NOTE — Progress Notes (Signed)
? ?Office Visit Note ?  ?Patient: [name]           ?Date of Birth: 1956-01-07           ?MRN: [id number] ?Visit Date: 01/29/2022 ?             ?Requested by: [name], MD ?[address] ?[place],  [place] [address] ?PCP: [name], MD ? ? ?Assessment & Plan: ?Visit Diagnoses:  ?1. Status post left knee replacement   ? ? ?Plan: 6 month TKA follow up plan ? ?Patient now 6 months status post left total knee arthroplasty. Wound is healed with no signs of complications or infection.  The patient does not complain of pain, and is back to normal daily activities. Functional range of motion.  Walks with cane in public.  It was reinforced that prophylactic antibiotics should be taken with any procedure including but not limited to dental work or colonoscopies.  He spent a week at [facility] for right leg cellulitis.  Fortunately, the left knee replacement was unaffected.  We will plan on following up at the 12 month postop visit with 2 view xrays of the operative knee at that time. As always, instructions were given to call with any questions or concerns in the interim. ? ? ?Follow-Up Instructions: Return in about 6 months (around 08/01/2022).  ? ?Orders:  ?Orders Placed This Encounter  ?Procedures  ? XR Knee 1-2 Views Left  ? ?No orders of the defined types were placed in this encounter. ? ? ? ? Procedures: ?No procedures performed ? ? ?Clinical Data: ?No additional findings. ? ? ?Subjective: ?Chief Complaint  ?Patient presents with  ? Left Knee - Follow-up  ?  Left total knee arthroplasty 08/06/2021  ? ? ?HPI ? ?Review of Systems ? ? ?Objective: ?Vital Signs: There were no vitals taken for this visit. ? ?Physical Exam ? ?Ortho Exam ? ?Specialty Comments:  ?No specialty comments available. ? ?Imaging: ?XR Knee 1-2 Views Left ? ?Result Date: 01/29/2022 ?Stable total knee replacement in good alignment.   ? ? ?PMFS History: ?Patient Active Problem List  ? Diagnosis Date Noted  ? PVD (peripheral  vascular disease) ([place]) 12/28/2021  ? Need for pneumococcal vaccination 12/28/2021  ? Need for influenza vaccination 12/28/2021  ? Sepsis due to cellulitis ([place]) 12/07/2021  ? Cellulitis of right lower extremity 12/06/2021  ? Status post total left knee replacement 08/06/2021  ? Encounter for screening colonoscopy 07/10/2021  ? UTI (urinary tract infection) 10/09/2020  ? Acute UTI 10/08/2020  ? Ureterolithiasis   ? Gross hematuria 06/16/2020  ? Renal lithiasis 06/07/2020  ? Renal stone 06/06/2020  ? Type 2 diabetes mellitus with stage 3b chronic kidney disease, with long-term current use of insulin ([place]) 05/26/2020  ? Visual disturbance 03/30/2020  ? Chronic pain of left knee 03/30/2020  ? Snores 03/30/2020  ? Chronic kidney disease, stage 3b ([place]) 03/30/2020  ? Midline low back pain without sciatica 03/30/2020  ? Hospital discharge follow-up 02/25/2020  ? Type 2 diabetes mellitus with diabetic polyneuropathy, with long-term current use of insulin ([place]) 02/23/2020  ? Syncope 02/08/2020  ? Renal stones 01/30/2020  ? Diminished pulses in lower extremity 01/26/2020  ? AKI (acute kidney injury) ([place]) 01/26/2020  ? Hyperkalemia 01/05/2020  ? Long term (current) use of anticoagulants 12/22/2019  ? Elevated cholesterol 12/17/2019  ? Primary osteoarthritis of left knee 12/17/2019  ? Arthritis 12/17/2019  ? Type 2 diabetes mellitus with hyperglycemia, with long-term current use of  insulin ([place]) 12/17/2019  ? History of pulmonary embolism 12/17/2019  ? Essential hypertension 12/17/2019  ? ?Past Medical History:  ?Diagnosis Date  ? Arthritis   ? Chronic kidney disease   ? reduced kidney function. Stage III  ? Diabetes mellitus without complication ([place])   ? DVT (deep venous thrombosis) ([place])   ? age indeterminate LLE DVT 02/09/20  ? Dyspnea   ? On exertion  ? History of kidney stones   ? History of pulmonary embolus ([place])   ? Hypertension   ? Lipidemia   ? Prostate enlargement   ? Pulmonary embolism ([place]) 2009  ? Sleep apnea   ?   ?Family History  ?Problem Relation Age of Onset  ? Cancer Mother   ? Heart disease Father   ?  ?Past Surgical History:  ?Procedure Laterality Date  ? CYSTOSCOPY W/ URETERAL STENT PLACEMENT Left 10/08/2020  ? Procedure: CYSTOSCOPY WITH RETROGRADE PYELOGRAM/URETERAL STENT PLACEMENT;  Surgeon: Irine Seal, MD;  Location: [facility];  Service: Urology;  Laterality: Left;  ? CYSTOSCOPY/URETEROSCOPY/HOLMIUM LASER/STENT PLACEMENT Right 06/07/2020  ? Procedure: CYSTOSCOPY/URETEROSCOPY/HOLMIUM LASER/STENT PLACEMENT;  Surgeon: Lucas Mallow, MD;  Location: [facility];  Service: Urology;  Laterality: Right;  ? CYSTOSCOPY/URETEROSCOPY/HOLMIUM LASER/STENT PLACEMENT Left 10/19/2020  ? Procedure: CYSTOSCOPY LEFT URETEROSCOPY/HOLMIUM LASER/STENT EXCHANGE;  Surgeon: Irine Seal, MD;  Location: [facility];  Service: Urology;  Laterality: Left;  ? TOTAL KNEE ARTHROPLASTY Left 08/06/2021  ? Procedure: LEFT TOTAL KNEE ARTHROPLASTY;  Surgeon: Leandrew Koyanagi, MD;  Location: [facility];  Service: Orthopedics;  Laterality: Left;  ? ?Social History  ? ?Occupational History  ? Occupation: retired  ?Tobacco Use  ? Smoking status: Never  ? Smokeless tobacco: Never  ?Vaping Use  ? Vaping Use: Never used  ?Substance and Sexual Activity  ? Alcohol use: Yes  ?  Alcohol/week: 1.0 standard drink  ?  Types: 1 Cans of beer per week  ?  Comment: social  ? Drug use: Never  ? Sexual activity: Yes  ? ? ? ? ? ? ?

## 2022-01-30 ENCOUNTER — Encounter (HOSPITAL_BASED_OUTPATIENT_CLINIC_OR_DEPARTMENT_OTHER): Payer: Medicare Other | Admitting: Physician Assistant

## 2022-01-30 DIAGNOSIS — Z86718 Personal history of other venous thrombosis and embolism: Secondary | ICD-10-CM | POA: Diagnosis not present

## 2022-01-30 DIAGNOSIS — I890 Lymphedema, not elsewhere classified: Secondary | ICD-10-CM | POA: Diagnosis not present

## 2022-01-30 DIAGNOSIS — L97212 Non-pressure chronic ulcer of right calf with fat layer exposed: Secondary | ICD-10-CM | POA: Diagnosis not present

## 2022-01-30 DIAGNOSIS — I872 Venous insufficiency (chronic) (peripheral): Secondary | ICD-10-CM | POA: Diagnosis not present

## 2022-01-30 DIAGNOSIS — Z7901 Long term (current) use of anticoagulants: Secondary | ICD-10-CM | POA: Diagnosis not present

## 2022-01-30 DIAGNOSIS — E11622 Type 2 diabetes mellitus with other skin ulcer: Secondary | ICD-10-CM | POA: Diagnosis not present

## 2022-01-30 DIAGNOSIS — L97812 Non-pressure chronic ulcer of other part of right lower leg with fat layer exposed: Secondary | ICD-10-CM | POA: Diagnosis not present

## 2022-01-30 NOTE — Progress Notes (Signed)
[name], [name] ([id number]) ?Visit Report for 01/30/2022 ?Arrival Information Details ?Patient Name: Date of Service: ?BRAITHWAITE, [name] 01/30/2022 8:15 A M ?Medical Record Number: [id number] ?Patient Account Number: [id number] ?Date of Birth/Sex: Treating RN: ?[birth year] (66 y.o. Marcheta Grammes ?Primary Care Thomasine Klutts: Abelino Derrick Other Clinician: ?Referring Johnatha Zeidman: ?Treating Callahan Wild/Extender: Worthy Keeler ?Abelino Derrick ?Weeks in Treatment: 3 ?Visit Information History Since Last Visit ?Added or deleted any medications: No ?Patient Arrived: [name] ?Any new allergies or adverse reactions: No ?Arrival Time: 08:09 ?Had a fall or experienced change in No ?Transfer Assistance: None ?activities of daily living that may affect ?Patient Requires Transmission-Based Precautions: No ?risk of falls: ?Patient Has Alerts: Yes ?Signs or symptoms of abuse/neglect since last visito No ?Patient Alerts: Patient on Blood Thinner ?Hospitalized since last visit: No ?Implantable device outside of the clinic excluding No ?cellular tissue based products placed in the center ?since last visit: ?Has Dressing in Place as Prescribed: Yes ?Has Compression in Place as Prescribed: Yes ?Pain Present Now: No ?Electronic Signature(s) ?Signed: 01/30/2022 8:51:03 AM By: Lorrin Jackson ?Entered By: Lorrin Jackson on 01/30/2022 08:51:03 ?-------------------------------------------------------------------------------- ?Compression Therapy Details ?Patient Name: Date of Service: ?[name], [name] 01/30/2022 8:15 A M ?Medical Record Number: [id number] ?Patient Account Number: [id number] ?Date of Birth/Sex: Treating RN: ?Sep 28, 1956 (66 y.o. Marcheta Grammes ?Primary Care Syd Newsome: Abelino Derrick Other Clinician: ?Referring Jhana Giarratano: ?Treating Sian Rockers/Extender: Worthy Keeler ?Abelino Derrick ?Weeks in Treatment: 3 ?Compression Therapy Performed for Wound Assessment: Wound #1 Right,Posterior Lower Leg ?Performed By: Clinician Lorrin Jackson, RN ?Compression Type:  Three Layer ?Post Procedure Diagnosis ?Same as Pre-procedure ?Electronic Signature(s) ?Signed: 01/30/2022 4:27:37 PM By: Lorrin Jackson ?Entered By: Lorrin Jackson on 01/30/2022 08:34:28 ?-------------------------------------------------------------------------------- ?Encounter Discharge Information Details ?Patient Name: ?Date of Service: ?[name], [name] 01/30/2022 8:15 A M ?Medical Record Number: [id number] ?Patient Account Number: [id number] ?Date of Birth/Sex: ?Treating RN: ?07-Jul-1956 (66 y.o. Marcheta Grammes ?Primary Care Jahmeek Shirk: Abelino Derrick ?Other Clinician: ?Referring Reyansh Kushnir: ?Treating Sussie Minor/Extender: Worthy Keeler ?Abelino Derrick ?Weeks in Treatment: 3 ?Encounter Discharge Information Items ?Discharge Condition: Stable ?Ambulatory Status: [name] ?Discharge Destination: Home ?Transportation: Private Auto ?Schedule Follow-up Appointment: Yes ?Clinical Summary of Care: Provided on 01/30/2022 ?Form Type Recipient ?Paper Patient Patient ?Electronic Signature(s) ?Signed: 01/30/2022 8:50:52 AM By: Lorrin Jackson ?Entered By: Lorrin Jackson on 01/30/2022 08:50:52 ?-------------------------------------------------------------------------------- ?Lower Extremity Assessment Details ?Patient Name: ?Date of Service: ?[name], [name] 01/30/2022 8:15 A M ?Medical Record Number: [id number] ?Patient Account Number: [id number] ?Date of Birth/Sex: ?Treating RN: ?1955-12-19 (66 y.o. Marcheta Grammes ?Primary Care Felice Hope: Abelino Derrick ?Other Clinician: ?Referring Tikesha Mort: ?Treating Nayab Aten/Extender: Worthy Keeler ?Abelino Derrick ?Weeks in Treatment: 3 ?Edema Assessment ?Assessed: [Left: No] [Right: Yes] ?Edema: [Left: Ye] [Right: s] ?Calf ?Left: Right: ?Point of Measurement: 39 cm From Medial Instep 40 cm ?Ankle ?Left: Right: ?Point of Measurement: 15 cm From Medial Instep 26.5 cm ?Knee To Floor ?Left: Right: ?From Medial Instep 49 cm ?Vascular Assessment ?Pulses: ?Dorsalis Pedis ?Palpable: [Right:Yes] ?Electronic  Signature(s) ?Signed: 01/30/2022 4:27:37 PM By: Lorrin Jackson ?Entered By: Lorrin Jackson on 01/30/2022 08:37:35 ?-------------------------------------------------------------------------------- ?Multi-Disciplinary Care Plan Details ?Patient Name: ?Date of Service: ?[name], [name] 01/30/2022 8:15 A M ?Medical Record Number: [id number] ?Patient Account Number: [id number] ?Date of Birth/Sex: ?Treating RN: ?08/07/1956 (66 y.o. Marcheta Grammes ?Primary Care Junita Kubota: Abelino Derrick ?Other Clinician: ?Referring Ceci Taliaferro: ?Treating Parvin Stetzer/Extender: Worthy Keeler ?Abelino Derrick ?Weeks in Treatment: 3 ?Active Inactive ?Nutrition ?Nursing Diagnoses: ?Potential for alteratiion in Nutrition/Potential for imbalanced nutrition ?Goals: ?Patient/caregiver agrees to and verbalizes understanding of need to obtain nutritional consultation ?Date Initiated:  01/09/2022 ?T arget Resolution Date: 02/07/2022 ?Goal Status: Active ?Interventions: ?Assess HgA1c results as ordered upon admission and as needed ?Provide education on elevated blood sugars and impact on wound healing ?Provide education on nutrition ?Treatment Activities: ?Obtain HgA1c : 01/09/2022 ?Notes: ?Pain, Acute or Chronic ?Nursing Diagnoses: ?Pain, acute or chronic: actual or potential ?Potential alteration in comfort, pain ?Goals: ?Patient will verbalize adequate pain control and receive pain control interventions during procedures as needed ?Date Initiated: 01/09/2022 ?Target Resolution Date: 02/08/2022 ?Goal Status: Active ?Patient/caregiver will verbalize comfort level met ?Date Initiated: 01/09/2022 ?Target Resolution Date: 02/07/2022 ?Goal Status: Active ?Interventions: ?Encourage patient to take pain medications as prescribed ?Provide education on pain management ?Reposition patient for comfort ?Treatment Activities: ?Administer pain control measures as ordered : 01/09/2022 ?Notes: ?Wound/Skin Impairment ?Nursing Diagnoses: ?Knowledge deficit related to ulceration/compromised  skin integrity ?Goals: ?Patient/caregiver will verbalize understanding of skin care regimen ?Date Initiated: 01/09/2022 ?Target Resolution Date: 02/07/2022 ?Goal Status: Active ?Interventions: ?Assess patient/caregiver ability to perform ulcer/skin care regimen upon admission and as needed ?Assess ulceration(s) every visit ?Provide education on ulcer and skin care ?Treatment Activities: ?Skin care regimen initiated : 01/09/2022 ?Topical wound management initiated : 01/09/2022 ?Notes: ?Electronic Signature(s) ?Signed: 01/30/2022 4:27:37 PM By: Lorrin Jackson ?Entered By: Lorrin Jackson on 01/30/2022 08:20:40 ?-------------------------------------------------------------------------------- ?Pain Assessment Details ?Patient Name: ?Date of Service: ?DOCKTER, [name] 01/30/2022 8:15 A M ?Medical Record Number: [id number] ?Patient Account Number: [id number] ?Date of Birth/Sex: ?Treating RN: ?[birth year] (66 y.o. Marcheta Grammes ?Primary Care Kaiden Dardis: Abelino Derrick ?Other Clinician: ?Referring Hammad Finkler: ?Treating Shamiracle Gorden/Extender: Worthy Keeler ?Abelino Derrick ?Weeks in Treatment: 3 ?Active Problems ?Location of Pain Severity and Description of Pain ?Patient Has Paino No ?Site Locations ?Pain Management and Medication ?Current Pain Management: ?Electronic Signature(s) ?Signed: 01/30/2022 4:27:37 PM By: Lorrin Jackson ?Entered By: Lorrin Jackson on 01/30/2022 08:15:17 ?-------------------------------------------------------------------------------- ?Patient/Caregiver Education Details ?Patient Name: ?Date of Service: ?[place], [name] 3/29/2023andnbsp8:15 A M ?Medical Record Number: [id number] ?Patient Account Number: [id number] ?Date of Birth/Gender: ?Treating RN: ?January 20, 1956 (66 y.o. Marcheta Grammes ?Primary Care Physician: Abelino Derrick ?Other Clinician: ?Referring Physician: ?Treating Physician/Extender: Worthy Keeler ?Abelino Derrick ?Weeks in Treatment: 3 ?Education Assessment ?Education Provided To: ?Patient ?Education Topics  Provided ?Elevated Blood Sugar/ Impact on Healing: ?Methods: Explain/Verbal, Printed ?Responses: State content correctly ?Venous: ?Methods: Explain/Verbal, Printed ?Responses: State content correctly ?Wound

## 2022-01-30 NOTE — Progress Notes (Addendum)
[name] [name] ([id number]) ?Visit Report for 01/30/2022 ?Chief Complaint Document Details ?Patient Name: Date of Service: ?[name] [name] 01/30/2022 8:15 A M ?Medical Record Number: [id number] ?Patient Account Number: [id number] ?Date of Birth/Sex: Treating RN: ?[birth year] (66 y.o. M) Rolin Barry, Bobbi ?Primary Care Provider: Abelino Derrick Other Clinician: ?Referring Provider: ?Treating Provider/Extender: Worthy Keeler ?Abelino Derrick ?Weeks in Treatment: 3 ?Information Obtained from: Patient ?Chief Complaint ?Right LE Ulcer ?Electronic Signature(s) ?Signed: 01/30/2022 8:33:06 AM By: Worthy Keeler PA-C ?Previous Signature: 01/30/2022 8:32:43 AM Version By: Worthy Keeler PA-C ?Entered By: Worthy Keeler on 01/30/2022 08:33:05 ?-------------------------------------------------------------------------------- ?HPI Details ?Patient Name: Date of Service: ?[name] [name] 01/30/2022 8:15 A M ?Medical Record Number: [id number] ?Patient Account Number: [id number] ?Date of Birth/Sex: Treating RN: ?10/25/1956 (66 y.o. M) Rolin Barry, Bobbi ?Primary Care Provider: Abelino Derrick Other Clinician: ?Referring Provider: ?Treating Provider/Extender: Worthy Keeler ?Abelino Derrick ?Weeks in Treatment: 3 ?History of Present Illness ?HPI Description: 01/09/2022 upon evaluation today patient appears to be doing well with regard to his leg compared to where things have been. I did review his ?note and it appears he had significant cellulitis. He was actually in the hospital due to sepsis from February 19 February 13. The good news is he recovered ?from that he was placed on Duricef for a time and then this was after the Keflex. The Keflex was really not effective. Eventually he was transitioned to ?Bactrim which is what he has been taking currently he tells me when they switch to that is when things really got better. That was due to a culture which showed ?Enterobacter as the main organism with Klebsiella being a secondary. Nonetheless in the end I do  feel like that the Bactrim has done a good job for him based ?on what he is telling me and what I am seeing. He did have a CT scan which was on 12/07/2021 which showed cellulitis fortunately nothing deeper structure was ?infected and no abscess. He also had a DVT study on 12/07/2020 which was negative. On 2/24 is when the culture was performed. His most recent hemoglobin ?A1c was 6.4 according to his primary care provider and the patient currently is on the Bactrim for a couple more days along with an Unna boot and alginate ?which appears to been the order although what he had was Curlex and Coban on when he came in today with alginate. 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 ?01/23/2022 upon evaluation today patient appears to be doing excellent in regard to his leg ulcer. This is significantly improved compared to his first visit. ?Overall both he and I are both extremely pleased with where things stand today his pain is better the infection seems to be cleared and overall I think you are ?on the right path. ?01/30/2022 upon evaluation today patient appears to be doing well currently in regard to his leg ulcers. He has been tolerating the dressing changes without ?complication. Fortunately I do not see any evidence of active infection locally or systemically which is great news. No fevers, chills, nausea, vomiting, or ?diarrhea. ?Electronic Signature(s) ?Signed: 01/30/2022 10:49:10 AM By: Worthy Keeler PA-C ?Entered By: Worthy Keeler on 01/30/2022 10:49:09 ?-------------------------------------------------------------------------------- ?Physical Exam Details ?Patient Name: Date of Service: ?[name] [name] 01/30/2022 8:15 A M ?Medical Record Number: [id number] ?Patient Account Number: [id number] ?Date of Birth/Sex: Treating RN: ?20-Sep-1956 (66 y.o. M) Rolin Barry, Bobbi ?Primary Care Provider:  Abelino Derrick Other Clinician: ?Referring Provider: ?Treating Provider/Extender: Worthy Keeler ?Abelino Derrick ?Weeks in Treatment: 3 ?Constitutional ?Well-nourished and well-hydrated in no acute distress. ?Respiratory ?normal breathing without difficulty. ?Psychiatric ?this patient is able to make decisions and demonstrates good insight into disease process. Alert and Oriented x 3. pleasant and cooperative. ?Notes ?Upon inspection patient's wound bed showed evidence of good granulation and epithelization at this point. Fortunately I do not see any evidence of active ?infection locally at this time which is excellent news I think that his leg is significantly smaller and the weeping is a lot less than what it was. I see a lot of new ?skin growth as well. ?Electronic Signature(s) ?Signed: 01/30/2022 10:49:36 AM By: Worthy Keeler PA-C ?Entered By: Worthy Keeler on 01/30/2022 10:49:36 ?-------------------------------------------------------------------------------- ?Physician Orders Details ?Patient Name: Date of Service: ?[name] [name] 01/30/2022 8:15 A M ?Medical Record Number: [id number] ?Patient Account Number: [id number] ?Date of Birth/Sex: Treating RN: ?03/24/1956 (66 y.o. 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 Margarita Grizzle and [facility] Room 8 ?Return A ?Edema Control - Lymphedema / SCD / Other ?Elevate  legs to the level of the heart or above for 30 minutes daily and/or when sitting, a frequency of: - 3-4 times a day throughout the day. ?Avoid standing for long periods of time. ?Exercise regularly ?Moisturize legs daily. - left leg every night before bed. ?Home Health ?No change in wound care orders this week; continue Home Health for wound care. May utilize formulary equivalent dressing for wound ?treatment orders unless otherwise specified. ?Other Home Health Orders/Instructions: - [facility] ?Wound Treatment ?Wound #1 - Lower Leg Wound Laterality: Right, Posterior ?Cleanser: Soap and Water (Home Health) 3 x Per Week/30 Days ?Discharge Instructions: May shower and wash wound with dial antibacterial soap and water prior to dressing change. ?Cleanser: Wound Cleanser (Home Health) 3 x Per Week/30 Days ?Discharge Instructions: Cleanse the wound with wound cleanser prior to applying a clean dressing using gauze sponges, not tissue or cotton balls. ?Peri-Wound Care: Triamcinolone 15 (g) (Home Health) 3 x Per Week/30 Days ?Discharge Instructions: Mix with lotion. ?Peri-Wound Care: Sween Lotion (Moisturizing lotion) (Home Health) 3 x Per Week/30 Days ?Discharge Instructions: Apply moisturizing lotion as directed ?Prim Dressing: KerraCel Ag Gelling Fiber Dressing, 4x5 in (silver alginate) (Home Health) 3 x Per Week/30 Days ?ary ?Discharge Instructions: Apply silver alginate to wound bed as instructed ?Secondary Dressing: ABD Pad, 8x10 (Home Health) 3 x Per Week/30 Days ?Discharge Instructions: Apply over primary dressing as directed. ?Compression Wrap: ThreePress (3 layer compression wrap) (Home Health) 3 x Per Week/30 Days ?Discharge Instructions: Apply three layer compression as directed. ?Electronic Signature(s) ?Signed: 01/30/2022 4:27:37 PM By: Lorrin Jackson ?Signed: 01/30/2022 5:06:09 PM By: Worthy Keeler PA-C ?Entered By: Lorrin Jackson on 01/30/2022  08:36:23 ?-------------------------------------------------------------------------------- ?Problem List Details ?Patient Name: ?Date of Service: ?[name] [name] 01/30/2022 8:15 A M ?Medical Record Number: [id number] ?Patient Account Number: [id number] ?Date of

## 2022-02-01 ENCOUNTER — Other Ambulatory Visit: Payer: Self-pay | Admitting: *Deleted

## 2022-02-01 DIAGNOSIS — I872 Venous insufficiency (chronic) (peripheral): Secondary | ICD-10-CM

## 2022-02-01 DIAGNOSIS — I739 Peripheral vascular disease, unspecified: Secondary | ICD-10-CM

## 2022-02-02 DIAGNOSIS — N179 Acute kidney failure, unspecified: Secondary | ICD-10-CM | POA: Diagnosis not present

## 2022-02-02 DIAGNOSIS — M1712 Unilateral primary osteoarthritis, left knee: Secondary | ICD-10-CM | POA: Diagnosis not present

## 2022-02-02 DIAGNOSIS — N1832 Chronic kidney disease, stage 3b: Secondary | ICD-10-CM | POA: Diagnosis not present

## 2022-02-02 DIAGNOSIS — Z7985 Long-term (current) use of injectable non-insulin antidiabetic drugs: Secondary | ICD-10-CM | POA: Diagnosis not present

## 2022-02-02 DIAGNOSIS — Z86718 Personal history of other venous thrombosis and embolism: Secondary | ICD-10-CM | POA: Diagnosis not present

## 2022-02-02 DIAGNOSIS — M545 Low back pain, unspecified: Secondary | ICD-10-CM | POA: Diagnosis not present

## 2022-02-02 DIAGNOSIS — L03115 Cellulitis of right lower limb: Secondary | ICD-10-CM | POA: Diagnosis not present

## 2022-02-02 DIAGNOSIS — Z794 Long term (current) use of insulin: Secondary | ICD-10-CM | POA: Diagnosis not present

## 2022-02-02 DIAGNOSIS — G4730 Sleep apnea, unspecified: Secondary | ICD-10-CM | POA: Diagnosis not present

## 2022-02-02 DIAGNOSIS — Z7901 Long term (current) use of anticoagulants: Secondary | ICD-10-CM | POA: Diagnosis not present

## 2022-02-02 DIAGNOSIS — Z86711 Personal history of pulmonary embolism: Secondary | ICD-10-CM | POA: Diagnosis not present

## 2022-02-02 DIAGNOSIS — E785 Hyperlipidemia, unspecified: Secondary | ICD-10-CM | POA: Diagnosis not present

## 2022-02-02 DIAGNOSIS — E1122 Type 2 diabetes mellitus with diabetic chronic kidney disease: Secondary | ICD-10-CM | POA: Diagnosis not present

## 2022-02-02 DIAGNOSIS — I129 Hypertensive chronic kidney disease with stage 1 through stage 4 chronic kidney disease, or unspecified chronic kidney disease: Secondary | ICD-10-CM | POA: Diagnosis not present

## 2022-02-04 DIAGNOSIS — Z86711 Personal history of pulmonary embolism: Secondary | ICD-10-CM | POA: Diagnosis not present

## 2022-02-04 DIAGNOSIS — G4730 Sleep apnea, unspecified: Secondary | ICD-10-CM | POA: Diagnosis not present

## 2022-02-04 DIAGNOSIS — Z794 Long term (current) use of insulin: Secondary | ICD-10-CM | POA: Diagnosis not present

## 2022-02-04 DIAGNOSIS — E785 Hyperlipidemia, unspecified: Secondary | ICD-10-CM | POA: Diagnosis not present

## 2022-02-04 DIAGNOSIS — I129 Hypertensive chronic kidney disease with stage 1 through stage 4 chronic kidney disease, or unspecified chronic kidney disease: Secondary | ICD-10-CM | POA: Diagnosis not present

## 2022-02-04 DIAGNOSIS — E1122 Type 2 diabetes mellitus with diabetic chronic kidney disease: Secondary | ICD-10-CM | POA: Diagnosis not present

## 2022-02-04 DIAGNOSIS — Z86718 Personal history of other venous thrombosis and embolism: Secondary | ICD-10-CM | POA: Diagnosis not present

## 2022-02-04 DIAGNOSIS — M1712 Unilateral primary osteoarthritis, left knee: Secondary | ICD-10-CM | POA: Diagnosis not present

## 2022-02-04 DIAGNOSIS — Z7901 Long term (current) use of anticoagulants: Secondary | ICD-10-CM | POA: Diagnosis not present

## 2022-02-04 DIAGNOSIS — M545 Low back pain, unspecified: Secondary | ICD-10-CM | POA: Diagnosis not present

## 2022-02-04 DIAGNOSIS — N179 Acute kidney failure, unspecified: Secondary | ICD-10-CM | POA: Diagnosis not present

## 2022-02-04 DIAGNOSIS — N1832 Chronic kidney disease, stage 3b: Secondary | ICD-10-CM | POA: Diagnosis not present

## 2022-02-04 DIAGNOSIS — L03115 Cellulitis of right lower limb: Secondary | ICD-10-CM | POA: Diagnosis not present

## 2022-02-04 DIAGNOSIS — Z7985 Long-term (current) use of injectable non-insulin antidiabetic drugs: Secondary | ICD-10-CM | POA: Diagnosis not present

## 2022-02-05 ENCOUNTER — Telehealth: Payer: Self-pay | Admitting: Family Medicine

## 2022-02-05 NOTE — Telephone Encounter (Signed)
Home health called wondering if we got a fax to fix the date of an order... it is from October 2022.  ?

## 2022-02-06 ENCOUNTER — Encounter (HOSPITAL_BASED_OUTPATIENT_CLINIC_OR_DEPARTMENT_OTHER): Payer: Medicare Other | Attending: Physician Assistant | Admitting: Physician Assistant

## 2022-02-06 ENCOUNTER — Ambulatory Visit (INDEPENDENT_AMBULATORY_CARE_PROVIDER_SITE_OTHER): Payer: Medicare Other

## 2022-02-06 DIAGNOSIS — E11622 Type 2 diabetes mellitus with other skin ulcer: Secondary | ICD-10-CM | POA: Insufficient documentation

## 2022-02-06 DIAGNOSIS — E1140 Type 2 diabetes mellitus with diabetic neuropathy, unspecified: Secondary | ICD-10-CM | POA: Insufficient documentation

## 2022-02-06 DIAGNOSIS — I890 Lymphedema, not elsewhere classified: Secondary | ICD-10-CM | POA: Insufficient documentation

## 2022-02-06 DIAGNOSIS — E11621 Type 2 diabetes mellitus with foot ulcer: Secondary | ICD-10-CM | POA: Diagnosis not present

## 2022-02-06 DIAGNOSIS — L97812 Non-pressure chronic ulcer of other part of right lower leg with fat layer exposed: Secondary | ICD-10-CM | POA: Insufficient documentation

## 2022-02-06 DIAGNOSIS — Z7901 Long term (current) use of anticoagulants: Secondary | ICD-10-CM | POA: Diagnosis not present

## 2022-02-06 DIAGNOSIS — Z86718 Personal history of other venous thrombosis and embolism: Secondary | ICD-10-CM | POA: Insufficient documentation

## 2022-02-06 DIAGNOSIS — I872 Venous insufficiency (chronic) (peripheral): Secondary | ICD-10-CM | POA: Insufficient documentation

## 2022-02-06 DIAGNOSIS — L97212 Non-pressure chronic ulcer of right calf with fat layer exposed: Secondary | ICD-10-CM | POA: Diagnosis not present

## 2022-02-06 LAB — POCT INR: INR: 2 (ref 2.0–3.0)

## 2022-02-06 NOTE — Patient Instructions (Addendum)
Pre visit review using our clinic review tool, if applicable. No additional management support is needed unless otherwise documented below in the visit note. ? ?Continue 5 mg daily except take 7.5 mg on Thursdays and Saturdays. Recheck in 6 weeks.  ?

## 2022-02-06 NOTE — Progress Notes (Addendum)
[place], [place] ([id number]) ?Visit Report for 02/06/2022 ?Chief Complaint Document Details ?Patient Name: Date of Service: ?[name], [name] 02/06/2022 8:15 A M ?Medical Record Number: [id number] ?Patient Account Number: [id number] ?Date of Birth/Sex: Treating RN: ?28-Jan-1956 (65 y.o. M) [name], [name] ?Primary Care Provider: Abelino [name] Other Clinician: ?Referring Provider: ?Treating Provider/Extender: [name] ?[name] ?Weeks in Treatment: 4 ?Information Obtained from: Patient ?Chief Complaint ?Right LE Ulcer ?Electronic Signature(s) ?Signed: 02/06/2022 8:35:13 AM By: [name] Keeler PA-C ?Entered By: [name] on 02/06/2022 08:35:12 ?-------------------------------------------------------------------------------- ?HPI Details ?Patient Name: Date of Service: ?[name], [name] 02/06/2022 8:15 A M ?Medical Record Number: [id number] ?Patient Account Number: [id number] ?Date of Birth/Sex: Treating RN: ?1956-10-13 (66 y.o. M) [name], [name] ?Primary Care Provider: Abelino [name] Other Clinician: ?Referring Provider: ?Treating Provider/Extender: [name] ?[name] ?Weeks in Treatment: 4 ?History of Present Illness ?HPI Description: 01/09/2022 upon evaluation today patient appears to be doing well with regard to his leg compared to where things have been. I did review his ?note and it appears he had significant cellulitis. He was actually in the hospital due to sepsis from February 19 February 13. The good news is he recovered ?from that he was placed on Duricef for a time and then this was after the Keflex. The Keflex was really not effective. Eventually he was transitioned to ?Bactrim which is what he has been taking currently he tells me when they switch to that is when things really got better. That was due to a culture which showed ?Enterobacter as the main organism with Klebsiella being a secondary. Nonetheless in the end I do feel like that the Bactrim has done a good job for him based ?on what he is  telling me and what I am seeing. He did have a CT scan which was on 12/07/2021 which showed cellulitis fortunately nothing deeper structure was ?infected and no abscess. He also had a DVT study on 12/07/2020 which was negative. On 2/24 is when the culture was performed. His most recent hemoglobin ?A1c was 6.4 according to his primary care provider and the patient currently is on the Bactrim for a couple more days along with an Unna boot and alginate ?which appears to been the order although what he had was Curlex and Coban on when he came in today with alginate. 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 ?01/23/2022 upon evaluation today patient appears to be doing excellent in regard to his leg ulcer. This is significantly improved compared to his first visit. ?Overall both he and I are both extremely pleased with where things stand today his pain is better the infection seems to be cleared and overall I think you are ?on the right path. ?01/30/2022 upon evaluation today patient appears to be doing well currently in regard to his leg ulcers. He has been tolerating the dressing changes without ?complication. Fortunately I do not see any evidence of active infection locally or systemically which is great news. No fevers, chills, nausea, vomiting, or ?diarrhea. ?02-06-2022 on evaluation today patient's wound is actually showing signs of significant improvement. We are getting very close to resolution on I am extremely ?pleased with where we stand today. I do not see any signs of active infection locally or systemically at this time which is great news. ?Electronic Signature(s) ?Signed: 02/06/2022 5:53:17 PM By: [name] Keeler PA-C ?Entered By: [name] on 02/06/2022 17:53:17 ?-------------------------------------------------------------------------------- ?  Physical Exam Details ?Patient  Name: Date of Service: ?[name], [name] 02/06/2022 8:15 A M ?Medical Record Number: [id number] ?Patient Account Number: [id number] ?Date of Birth/Sex: Treating RN: ?10/31/1956 (66 y.o. M) [name], [name] ?Primary Care Provider: Abelino [name] Other Clinician: ?Referring Provider: ?Treating Provider/Extender: [name] ?[name] ?Weeks in Treatment: 4 ?Constitutional ?Well-nourished and well-hydrated in no acute distress. ?Respiratory ?normal breathing without difficulty. ?Psychiatric ?this patient is able to make decisions and demonstrates good insight into disease process. Alert and Oriented x 3. pleasant and cooperative. ?Notes ?Upon inspection patient's wound bed did not require any sharp debridement and actually appears to be quite well and I am very pleased in that regard. I do not ?see any signs of infection otherwise locally nor systemically which is great news. ?Electronic Signature(s) ?Signed: 02/06/2022 5:53:52 PM By: [name] Keeler PA-C ?Entered By: [name] on 02/06/2022 17:53:52 ?-------------------------------------------------------------------------------- ?Physician Orders Details ?Patient Name: Date of Service: ?[name], [name] 02/06/2022 8:15 A M ?Medical Record Number: [id number] ?Patient Account Number: [id number] ?Date of Birth/Sex: Treating RN: ?03/14/1956 (66 y.o. 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 [name] and [name] Room 8 02/13/2022 0815 ?Return A ?Other: - Go ahead and purchase compression stockings- bring in weekly incase the wound closes. ?Edema Control - Lymphedema / SCD / Other ?Elevate legs to the level of the heart or above for 30 minutes daily and/or when sitting, a frequency of: - 3-4 times a day throughout the day. ?Avoid standing for long periods of time. ?Exercise regularly ?Moisturize legs daily. - left leg every night before bed. ?Home Health ?No change in wound care orders this week; continue Home Health for wound care. May utilize formulary equivalent dressing for wound ?treatment orders unless otherwise specified. ?Other Home Health Orders/Instructions: - [facility] ?Wound Treatment ?Wound #1 - Lower Leg Wound Laterality: Right, Posterior ?Cleanser: Soap and Water (Home Health) 3 x Per Week/30 Days ?Discharge Instructions: May shower and wash wound with dial antibacterial soap and water prior to dressing change. ?Cleanser: Wound Cleanser (Home Health) 3 x Per Week/30 Days ?Discharge Instructions: Cleanse the wound with wound cleanser prior to applying a clean dressing using gauze sponges, not tissue or cotton balls. ?Peri-Wound Care: Triamcinolone 15 (g) (Home Health) 3 x Per Week/30 Days ?Discharge Instructions: Mix with lotion. ?Peri-Wound Care: Sween Lotion (Moisturizing lotion) (Home Health) 3 x Per Week/30 Days ?Discharge Instructions: Apply moisturizing lotion as directed ?Prim Dressing: KerraCel Ag Gelling Fiber Dressing, 4x5 in (silver alginate) (Home Health) 3 x Per Week/30 Days ?ary ?Discharge Instructions: Apply silver alginate to wound bed as instructed ?Secondary Dressing: ABD Pad, 8x10 (Home Health) 3 x Per Week/30 Days ?Discharge Instructions: Apply over primary dressing as directed. ?Compression Wrap: ThreePress (3 layer compression wrap) (Home Health) 3 x Per Week/30 Days ?Discharge Instructions: Apply three layer compression as  directed. ?Electronic Signature(s) ?Signed: 02/06/2022 5:22:02 PM By: Deon Pilling RN, BSN ?Signed: 02/06/2022 5:59:40 PM By: [name] Keeler PA-C ?Entered By: Deon Pilling on 02/06/2022 08:41:10 ?------------------------

## 2022-02-06 NOTE — Progress Notes (Signed)
[place], Hady ([id number]) ?Visit Report for 02/06/2022 ?Arrival Information Details ?Patient Name: Date of Service: ?[name], [name] 02/06/2022 8:15 A M ?Medical Record Number: [id number] ?Patient Account Number: [id number] ?Date of Birth/Sex: Treating RN: ?1956/05/20 (66 y.o. M) Rolin Barry, Bobbi ?Primary Care Amire Leazer: Abelino Derrick Other Clinician: ?Referring Brittanee Ghazarian: ?Treating Lyndall Bellot/Extender: Worthy Keeler ?Abelino Derrick ?Weeks in Treatment: 4 ?Visit Information History Since Last Visit ?Added or deleted any medications: No ?Patient Arrived: Kasandra Knudsen ?Any new allergies or adverse reactions: No ?Arrival Time: 08:10 ?Had a fall or experienced change in No ?Accompanied By: self ?activities of daily living that may affect ?Transfer Assistance: None ?risk of falls: ?Patient Identification Verified: Yes ?Signs or symptoms of abuse/neglect since last visito No ?Secondary Verification Process Completed: Yes ?Hospitalized since last visit: No ?Patient Requires Transmission-Based Precautions: No ?Implantable device outside of the clinic excluding No ?Patient Has Alerts: Yes ?cellular tissue based products placed in the center ?Patient Alerts: Patient on Blood Thinner since last visit: ?Has Dressing in Place as Prescribed: Yes ?Has Compression in Place as Prescribed: Yes ?Pain Present Now: No ?Electronic Signature(s) ?Signed: 02/06/2022 5:22:02 PM By: Deon Pilling RN, BSN ?Entered By: Deon Pilling on 02/06/2022 08:11:03 ?-------------------------------------------------------------------------------- ?Compression Therapy Details ?Patient Name: Date of Service: ?[name], [name] 02/06/2022 8:15 A M ?Medical Record Number: [id number] ?Patient Account Number: [id number] ?Date of Birth/Sex: Treating RN: ?22-Oct-1956 (66 y.o. M) Rolin Barry, Bobbi ?Primary Care Ellean Firman: Abelino Derrick Other Clinician: ?Referring Teddy Pena: ?Treating Elwyn Lowden/Extender: Worthy Keeler ?Abelino Derrick ?Weeks in Treatment: 4 ?Compression Therapy Performed for Wound  Assessment: Wound #1 Right,Posterior Lower Leg ?Performed By: Clinician Deon Pilling, RN ?Compression Type: Three Layer ?Post Procedure Diagnosis ?Same as Pre-procedure ?Electronic Signature(s) ?Signed: 02/06/2022 5:22:02 PM By: Deon Pilling RN, BSN ?Entered By: Deon Pilling on 02/06/2022 08:38:47 ?-------------------------------------------------------------------------------- ?Encounter Discharge Information Details ?Patient Name: ?Date of Service: ?[name], [name] 02/06/2022 8:15 A M ?Medical Record Number: [id number] ?Patient Account Number: [id number] ?Date of Birth/Sex: ?Treating RN: ?1956/03/15 (66 y.o. M) Rolin Barry, Bobbi ?Primary Care Mattie Nordell: Abelino Derrick ?Other Clinician: ?Referring Lakoda Mcanany: ?Treating Tiara Maultsby/Extender: Worthy Keeler ?Abelino Derrick ?Weeks in Treatment: 4 ?Encounter Discharge Information Items ?Discharge Condition: Stable ?Ambulatory Status: Kasandra Knudsen ?Discharge Destination: Home ?Transportation: Private Auto ?Accompanied By: self ?Schedule Follow-up Appointment: Yes ?Clinical Summary of Care: ?Electronic Signature(s) ?Signed: 02/06/2022 5:22:02 PM By: Deon Pilling RN, BSN ?Entered By: Deon Pilling on 02/06/2022 08:42:21 ?-------------------------------------------------------------------------------- ?Lower Extremity Assessment Details ?Patient Name: ?Date of Service: ?[name], [name] 02/06/2022 8:15 A M ?Medical Record Number: [id number] ?Patient Account Number: [id number] ?Date of Birth/Sex: ?Treating RN: ?October 09, 1956 (66 y.o. M) Rolin Barry, Bobbi ?Primary Care Campbell Kray: Abelino Derrick ?Other Clinician: ?Referring Kabao Leite: ?Treating Quantel Mcinturff/Extender: Worthy Keeler ?Abelino Derrick ?Weeks in Treatment: 4 ?Edema Assessment ?Assessed: [Left: No] [Right: Yes] ?Edema: [Left: N] [Right: o] ?Calf ?Left: Right: ?Point of Measurement: 39 cm From Medial Instep 40 cm ?Ankle ?Left: Right: ?Point of Measurement: 15 cm From Medial Instep 25 cm ?Vascular Assessment ?Pulses: ?Dorsalis Pedis ?Palpable:  [Right:Yes] ?Electronic Signature(s) ?Signed: 02/06/2022 5:22:02 PM By: Deon Pilling RN, BSN ?Entered By: Deon Pilling on 02/06/2022 08:14:41 ?-------------------------------------------------------------------------------- ?Multi-Disciplinary Care Plan Details ?Patient Name: ?Date of Service: ?[name], [name] 02/06/2022 8:15 A M ?Medical Record Number: [id number] ?Patient Account Number: [id number] ?Date of Birth/Sex: ?Treating RN: ?06-18-1956 (66 y.o. M) Rolin Barry, Bobbi ?Primary Care Pritika Alvarez: Abelino Derrick ?Other Clinician: ?Referring Kimo Bancroft: ?Treating Markanthony Gedney/Extender: Worthy Keeler ?Abelino Derrick ?Weeks in Treatment: 4 ?Active Inactive ?Nutrition ?Nursing Diagnoses: ?Potential for alteratiion in Nutrition/Potential for imbalanced nutrition ?Goals: ?Patient/caregiver agrees to and verbalizes understanding of need to obtain  nutritional consultation ?Date Initiated: 01/09/2022 ?T arget Resolution Date: 03/01/2022 ?Goal Status: Active ?Interventions: ?Assess HgA1c results as ordered upon admission and as needed ?Provide education on elevated blood sugars and impact on wound healing ?Provide education on nutrition ?Treatment Activities: ?Obtain HgA1c : 01/09/2022 ?Notes: ?Pain, Acute or Chronic ?Nursing Diagnoses: ?Pain, acute or chronic: actual or potential ?Potential alteration in comfort, pain ?Goals: ?Patient will verbalize adequate pain control and receive pain control interventions during procedures as needed ?Date Initiated: 01/09/2022 ?Target Resolution Date: 02/27/2022 ?Goal Status: Active ?Patient/caregiver will verbalize comfort level met ?Date Initiated: 01/09/2022 ?Target Resolution Date: 02/27/2022 ?Goal Status: Active ?Interventions: ?Encourage patient to take pain medications as prescribed ?Provide education on pain management ?Reposition patient for comfort ?Treatment Activities: ?Administer pain control measures as ordered : 01/09/2022 ?Notes: ?Electronic Signature(s) ?Signed: 02/06/2022 5:22:02 PM By: Deon Pilling RN, BSN ?Entered By: Deon Pilling on 02/06/2022 08:17:00 ?-------------------------------------------------------------------------------- ?Pain Assessment Details ?Patient Name: ?Date of Service: ?[name], [name] 02/06/2022 8:15 A M ?Medical Record Number: [id number] ?Patient Account Number: [id number] ?Date of Birth/Sex: ?Treating RN: ?09/24/1956 (66 y.o. M) Rolin Barry, Bobbi ?Primary Care Tezra Mahr: Abelino Derrick ?Other Clinician: ?Referring Vesta Wheeland: ?Treating Quron Ruddy/Extender: Worthy Keeler ?Abelino Derrick ?Weeks in Treatment: 4 ?Active Problems ?Location of Pain Severity and Description of Pain ?Patient Has Paino No ?Site Locations ?Rate the pain. ?Current Pain Level: 0 ?Pain Management and Medication ?Current Pain Management: ?Medication: No ?Cold Application: No ?Rest: No ?Massage: No ?Activity: No ?T.E.N.S.: No ?Heat Application: No ?Leg drop or elevation: No ?Is the Current Pain Management Adequate: Adequate ?How does your wound impact your activities of daily livingo ?Sleep: No ?Bathing: No ?Appetite: No ?Relationship With Others: No ?Bladder Continence: No ?Emotions: No ?Bowel Continence: No ?Work: No ?Toileting: No ?Drive: No ?Dressing: No ?Hobbies: No ?Electronic Signature(s) ?Signed: 02/06/2022 5:22:02 PM By: Deon Pilling RN, BSN ?Entered By: Deon Pilling on 02/06/2022 08:12:39 ?-------------------------------------------------------------------------------- ?Patient/Caregiver Education Details ?Patient Name: ?Date of Service: ?[name], [name] 4/5/2023andnbsp8:15 A M ?Medical Record Number: [id number] ?Patient Account Number: [id number] ?Date of Birth/Gender: ?Treating RN: ?04-05-1956 (66 y.o. M) Rolin Barry, Bobbi ?Primary Care Physician: Abelino Derrick ?Other Clinician: ?Referring Physician: ?Treating Physician/Extender: Worthy Keeler ?Abelino Derrick ?Weeks in Treatment: 4 ?Education Assessment ?Education Provided To: ?Patient ?Education Topics Provided ?Elevated Blood Sugar/ Impact on  Healing: ?Handouts: ?Elevated Blood Sugars: How Do They Affect Wound Healing ?Methods: Explain/Verbal ?Responses: Reinforcements needed ?Electronic Signature(s) ?Signed: 02/06/2022 5:22:02 PM By: Deon Pilling RN, BSN ?Entered By: Geronimo Boot

## 2022-02-06 NOTE — Progress Notes (Signed)
When pt finished the abx, he restarted his normal dosing, which was about 2 weeks ago. Dosing calendar was updated with prior dosing schedule. ?Continue 5 mg daily except take 7.5 mg on Thursdays and Saturdays. Recheck in 6 weeks.  ?

## 2022-02-08 DIAGNOSIS — L03115 Cellulitis of right lower limb: Secondary | ICD-10-CM | POA: Diagnosis not present

## 2022-02-08 DIAGNOSIS — Z86711 Personal history of pulmonary embolism: Secondary | ICD-10-CM | POA: Diagnosis not present

## 2022-02-08 DIAGNOSIS — E1122 Type 2 diabetes mellitus with diabetic chronic kidney disease: Secondary | ICD-10-CM | POA: Diagnosis not present

## 2022-02-08 DIAGNOSIS — N179 Acute kidney failure, unspecified: Secondary | ICD-10-CM | POA: Diagnosis not present

## 2022-02-08 DIAGNOSIS — G4730 Sleep apnea, unspecified: Secondary | ICD-10-CM | POA: Diagnosis not present

## 2022-02-08 DIAGNOSIS — Z794 Long term (current) use of insulin: Secondary | ICD-10-CM | POA: Diagnosis not present

## 2022-02-08 DIAGNOSIS — E785 Hyperlipidemia, unspecified: Secondary | ICD-10-CM | POA: Diagnosis not present

## 2022-02-08 DIAGNOSIS — Z7985 Long-term (current) use of injectable non-insulin antidiabetic drugs: Secondary | ICD-10-CM | POA: Diagnosis not present

## 2022-02-08 DIAGNOSIS — N1832 Chronic kidney disease, stage 3b: Secondary | ICD-10-CM | POA: Diagnosis not present

## 2022-02-08 DIAGNOSIS — I129 Hypertensive chronic kidney disease with stage 1 through stage 4 chronic kidney disease, or unspecified chronic kidney disease: Secondary | ICD-10-CM | POA: Diagnosis not present

## 2022-02-08 DIAGNOSIS — M1712 Unilateral primary osteoarthritis, left knee: Secondary | ICD-10-CM | POA: Diagnosis not present

## 2022-02-08 DIAGNOSIS — M545 Low back pain, unspecified: Secondary | ICD-10-CM | POA: Diagnosis not present

## 2022-02-08 DIAGNOSIS — Z7901 Long term (current) use of anticoagulants: Secondary | ICD-10-CM | POA: Diagnosis not present

## 2022-02-08 DIAGNOSIS — Z86718 Personal history of other venous thrombosis and embolism: Secondary | ICD-10-CM | POA: Diagnosis not present

## 2022-02-10 DIAGNOSIS — G4733 Obstructive sleep apnea (adult) (pediatric): Secondary | ICD-10-CM | POA: Diagnosis not present

## 2022-02-11 DIAGNOSIS — Z794 Long term (current) use of insulin: Secondary | ICD-10-CM | POA: Diagnosis not present

## 2022-02-11 DIAGNOSIS — E1122 Type 2 diabetes mellitus with diabetic chronic kidney disease: Secondary | ICD-10-CM | POA: Diagnosis not present

## 2022-02-11 DIAGNOSIS — Z86711 Personal history of pulmonary embolism: Secondary | ICD-10-CM | POA: Diagnosis not present

## 2022-02-11 DIAGNOSIS — Z86718 Personal history of other venous thrombosis and embolism: Secondary | ICD-10-CM | POA: Diagnosis not present

## 2022-02-11 DIAGNOSIS — N1832 Chronic kidney disease, stage 3b: Secondary | ICD-10-CM | POA: Diagnosis not present

## 2022-02-11 DIAGNOSIS — Z7901 Long term (current) use of anticoagulants: Secondary | ICD-10-CM | POA: Diagnosis not present

## 2022-02-11 DIAGNOSIS — M545 Low back pain, unspecified: Secondary | ICD-10-CM | POA: Diagnosis not present

## 2022-02-11 DIAGNOSIS — G4730 Sleep apnea, unspecified: Secondary | ICD-10-CM | POA: Diagnosis not present

## 2022-02-11 DIAGNOSIS — M1712 Unilateral primary osteoarthritis, left knee: Secondary | ICD-10-CM | POA: Diagnosis not present

## 2022-02-11 DIAGNOSIS — I129 Hypertensive chronic kidney disease with stage 1 through stage 4 chronic kidney disease, or unspecified chronic kidney disease: Secondary | ICD-10-CM | POA: Diagnosis not present

## 2022-02-11 DIAGNOSIS — L03115 Cellulitis of right lower limb: Secondary | ICD-10-CM | POA: Diagnosis not present

## 2022-02-11 DIAGNOSIS — N179 Acute kidney failure, unspecified: Secondary | ICD-10-CM | POA: Diagnosis not present

## 2022-02-11 DIAGNOSIS — E785 Hyperlipidemia, unspecified: Secondary | ICD-10-CM | POA: Diagnosis not present

## 2022-02-11 DIAGNOSIS — Z7985 Long-term (current) use of injectable non-insulin antidiabetic drugs: Secondary | ICD-10-CM | POA: Diagnosis not present

## 2022-02-13 ENCOUNTER — Telehealth: Payer: Self-pay | Admitting: Family Medicine

## 2022-02-13 ENCOUNTER — Encounter (HOSPITAL_BASED_OUTPATIENT_CLINIC_OR_DEPARTMENT_OTHER): Payer: Medicare Other | Admitting: Physician Assistant

## 2022-02-13 DIAGNOSIS — I890 Lymphedema, not elsewhere classified: Secondary | ICD-10-CM | POA: Diagnosis not present

## 2022-02-13 DIAGNOSIS — Z7901 Long term (current) use of anticoagulants: Secondary | ICD-10-CM | POA: Diagnosis not present

## 2022-02-13 DIAGNOSIS — L97812 Non-pressure chronic ulcer of other part of right lower leg with fat layer exposed: Secondary | ICD-10-CM | POA: Diagnosis not present

## 2022-02-13 DIAGNOSIS — E11622 Type 2 diabetes mellitus with other skin ulcer: Secondary | ICD-10-CM | POA: Diagnosis not present

## 2022-02-13 DIAGNOSIS — E1140 Type 2 diabetes mellitus with diabetic neuropathy, unspecified: Secondary | ICD-10-CM | POA: Diagnosis not present

## 2022-02-13 DIAGNOSIS — Z86718 Personal history of other venous thrombosis and embolism: Secondary | ICD-10-CM | POA: Diagnosis not present

## 2022-02-13 DIAGNOSIS — E11621 Type 2 diabetes mellitus with foot ulcer: Secondary | ICD-10-CM | POA: Diagnosis not present

## 2022-02-13 DIAGNOSIS — L97212 Non-pressure chronic ulcer of right calf with fat layer exposed: Secondary | ICD-10-CM | POA: Diagnosis not present

## 2022-02-13 DIAGNOSIS — I872 Venous insufficiency (chronic) (peripheral): Secondary | ICD-10-CM | POA: Diagnosis not present

## 2022-02-13 NOTE — Telephone Encounter (Signed)
Provider aware

## 2022-02-13 NOTE — Progress Notes (Addendum)
[name] [name] ([id number]) ?Visit Report for 02/13/2022 ?Chief Complaint Document Details ?Patient Name: Date of Service: ?[name] [name] 02/13/2022 8:15 A M ?Medical Record Number: [id number] ?Patient Account Number: [id number] ?Date of Birth/Sex: Treating RN: ?[birth year] (66 y.o. M) Rolin Barry, Bobbi ?Primary Care Provider: Abelino Derrick Other Clinician: ?Referring Provider: ?Treating Provider/Extender: Worthy Keeler ?Abelino Derrick ?Weeks in Treatment: 5 ?Information Obtained from: Patient ?Chief Complaint ?Right LE Ulcer ?Electronic Signature(s) ?Signed: 02/13/2022 8:16:58 AM By: Worthy Keeler PA-C ?Entered By: Worthy Keeler on 02/13/2022 08:16:58 ?-------------------------------------------------------------------------------- ?HPI Details ?Patient Name: Date of Service: ?[name] [name] 02/13/2022 8:15 A M ?Medical Record Number: [id number] ?Patient Account Number: [id number] ?Date of Birth/Sex: Treating RN: ?Feb 21, 1956 (66 y.o. M) Rolin Barry, Bobbi ?Primary Care Provider: Abelino Derrick Other Clinician: ?Referring Provider: ?Treating Provider/Extender: Worthy Keeler ?Abelino Derrick ?Weeks in Treatment: 5 ?History of Present Illness ?HPI Description: 01/09/2022 upon evaluation today patient appears to be doing well with regard to his leg compared to where things have been. I did review his ?note and it appears he had significant cellulitis. He was actually in the hospital due to sepsis from February 19 February 13. The good news is he recovered ?from that he was placed on Duricef for a time and then this was after the Keflex. The Keflex was really not effective. Eventually he was transitioned to ?Bactrim which is what he has been taking currently he tells me when they switch to that is when things really got better. That was due to a culture which showed ?Enterobacter as the main organism with Klebsiella being a secondary. Nonetheless in the end I do feel like that the Bactrim has done a good job for him based ?on what he  is telling me and what I am seeing. He did have a CT scan which was on 12/07/2021 which showed cellulitis fortunately nothing deeper structure was ?infected and no abscess. He also had a DVT study on 12/07/2020 which was negative. On 2/24 is when the culture was performed. His most recent hemoglobin ?A1c was 6.4 according to his primary care provider and the patient currently is on the Bactrim for a couple more days along with an Unna boot and alginate ?which appears to been the order although what he had was Curlex and Coban on when he came in today with alginate. 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 ?01/23/2022 upon evaluation today patient appears to be doing excellent in regard to his leg ulcer. This is significantly improved compared to his first visit. ?Overall both he and I are both extremely pleased with where things stand today his pain is better the infection seems to be cleared and overall I think you are ?on the right path. ?01/30/2022 upon evaluation today patient appears to be doing well currently in regard to his leg ulcers. He has been tolerating the dressing changes without ?complication. Fortunately I do not see any evidence of active infection locally or systemically which is great news. No fevers, chills, nausea, vomiting, or ?diarrhea. ?02-06-2022 on evaluation today patient's wound is actually showing signs of significant improvement. We are getting very close to resolution on I am extremely ?pleased with where we stand today. I do not see any signs of active infection locally or systemically at this time which is great news. ?02-13-2022 upon evaluation today patient appears to be doing well with regard to his wounds. He has been tolerating  the dressing changes without complication ?overall very pleased with where we stand today. ?Electronic Signature(s) ?Signed:  02/13/2022 8:28:57 AM By: Worthy Keeler PA-C ?Entered By: Worthy Keeler on 02/13/2022 08:28:57 ?-------------------------------------------------------------------------------- ?Physical Exam Details ?Patient Name: Date of Service: ?[name] [name] 02/13/2022 8:15 A M ?Medical Record Number: [id number] ?Patient Account Number: [id number] ?Date of Birth/Sex: Treating RN: ?[birth year] (66 y.o. M) Rolin Barry, Bobbi ?Primary Care Provider: Abelino Derrick Other Clinician: ?Referring Provider: ?Treating Provider/Extender: Worthy Keeler ?Abelino Derrick ?Weeks in Treatment: 5 ?Constitutional ?Well-nourished and well-hydrated in no acute distress. ?Respiratory ?normal breathing without difficulty. ?Psychiatric ?this patient is able to make decisions and demonstrates good insight into disease process. Alert and Oriented x 3. pleasant and cooperative. ?Notes ?Patient's wound bed showed evidence of good granulation and epithelization at this point. Fortunately I do not see any signs of active infection locally nor ?systemically which is great news and overall I feel like we are definitely on the right track. ?Electronic Signature(s) ?Signed: 02/13/2022 8:29:11 AM By: Worthy Keeler PA-C ?Entered By: Worthy Keeler on 02/13/2022 08:29:11 ?-------------------------------------------------------------------------------- ?Physician Orders Details ?Patient Name: Date of Service: ?[name] [name] 02/13/2022 8:15 A M ?Medical Record Number: [id number] ?Patient Account Number: [id number] ?Date of Birth/Sex: Treating RN: ?[birth year] (66 y.o. M) Rolin Barry, Bobbi ?Primary Care Provider: Abelino Derrick Other Clinician: ?Referring Provider: ?Treating Provider/Extender: Worthy Keeler ?Abelino Derrick ?Weeks in Treatment: 5 ?Verbal / Phone Orders: No ?Diagnosis Coding ?ICD-10 Coding ?Code Description ?I87.2 Venous insufficiency (chronic) (peripheral) ?I89.0 Lymphedema, not elsewhere classified ?E11.622 Type 2 diabetes mellitus with other skin  ulcer ?N62.952 Non-pressure chronic ulcer of other part of right lower leg with fat layer exposed ?Z79.01 Long term (current) use of anticoagulants ?W41.324 Personal history of other venous thrombosis and embolism ?Follow-up Appointments ?ppointment in 1 week. Margarita Grizzle and Bobbi Room 8 02/20/2022 0815 ?Return A ?Other: - Bring in compression stockings weekly in case your wounds close. ?Edema Control - Lymphedema / SCD / Other ?Elevate legs to the level of the heart or above for 30 minutes daily and/or when sitting, a frequency of: - 3-4 times a day throughout the day. ?Avoid standing for long periods of time. ?Exercise regularly ?Moisturize legs daily. - left leg every night before bed. ?Non Wound Condition ?Protect area with: - apply calcium alginate Ag and gauze to right medial lower leg area. ?Home Health ?No change in wound care orders this week; continue Home Health for wound care. May utilize formulary equivalent dressing for wound ?treatment orders unless otherwise specified. ?Other Home Health Orders/Instructions: - [facility] ?Wound Treatment ?Wound #1 - Lower Leg Wound Laterality: Right, Posterior ?Cleanser: Soap and Water (Home Health) 3 x Per Week/30 Days ?Discharge Instructions: May shower and wash wound with dial antibacterial soap and water prior to dressing change. ?Cleanser: Wound Cleanser (Home Health) 3 x Per Week/30 Days ?Discharge Instructions: Cleanse the wound with wound cleanser prior to applying a clean dressing using gauze sponges, not tissue or cotton balls. ?Peri-Wound Care: Triamcinolone 15 (g) (Home Health) 3 x Per Week/30 Days ?Discharge Instructions: Mix with lotion. ?Peri-Wound Care: Sween Lotion (Moisturizing lotion) (Home Health) 3 x Per Week/30 Days ?Discharge Instructions: Apply moisturizing lotion as directed ?Prim Dressing: KerraCel Ag Gelling Fiber Dressing, 4x5 in (silver alginate) (Home Health) 3 x Per Week/30 Days ?ary ?Discharge Instructions: Apply silver alginate  to wound bed as instructed ?Secondary Dressing: ABD Pad, 8x10 (Home Health) 3 x Per Week/30 Days ?Discharge Instructions: Apply over primary dressing as directed. ?Compression Wrap: ThreePress (  3 layer compression wra

## 2022-02-13 NOTE — Telephone Encounter (Signed)
HH ORDERS  ? ?Caller Name: Marliss Czar w/[facility] ?Callback Phone #: [phone] ?Misc:  missed visit today for wound care - pt has appt with outpatient wound care center ?

## 2022-02-13 NOTE — Progress Notes (Signed)
[place], [place] ([id number]) ?Visit Report for 02/13/2022 ?Arrival Information Details ?Patient Name: Date of Service: ?PAGLIA, Mael 02/13/2022 8:15 A M ?Medical Record Number: [id number] ?Patient Account Number: [id number] ?Date of Birth/Sex: Treating RN: ?22-Jun-1956 (66 y.o. M) Rolin Barry, Bobbi ?Primary Care Muhammadali Ries: Abelino Derrick Other Clinician: ?Referring Josue Kass: ?Treating Elnoria Livingston/Extender: Worthy Keeler ?Abelino Derrick ?Weeks in Treatment: 5 ?Visit Information History Since Last Visit ?Added or deleted any medications: No ?Patient Arrived: Kasandra Knudsen ?Any new allergies or adverse reactions: No ?Arrival Time: 08:06 ?Had a fall or experienced change in No ?Accompanied By: self ?activities of daily living that may affect ?Transfer Assistance: None ?risk of falls: ?Patient Identification Verified: Yes ?Signs or symptoms of abuse/neglect since last visito No ?Secondary Verification Process Completed: Yes ?Hospitalized since last visit: No ?Patient Requires Transmission-Based Precautions: No ?Implantable device outside of the clinic excluding No ?Patient Has Alerts: Yes ?cellular tissue based products placed in the center ?Patient Alerts: Patient on Blood Thinner since last visit: ?Has Dressing in Place as Prescribed: Yes ?Has Compression in Place as Prescribed: Yes ?Pain Present Now: No ?Notes ?home health wrapped from ankle up to knee, left the foot out. Explained to patient the foot needs wrapping due to swelling. ?Electronic Signature(s) ?Signed: 02/13/2022 5:47:39 PM By: Deon Pilling RN, BSN ?Entered By: Deon Pilling on 02/13/2022 08:19:06 ?-------------------------------------------------------------------------------- ?Compression Therapy Details ?Patient Name: Date of Service: ?GROVE, Mansour 02/13/2022 8:15 A M ?Medical Record Number: [id number] ?Patient Account Number: [id number] ?Date of Birth/Sex: Treating RN: ?[birth year] (66 y.o. M) Rolin Barry, Bobbi ?Primary Care Shelma Eiben: Abelino Derrick Other Clinician: ?Referring  Taino Maertens: ?Treating Tranesha Lessner/Extender: Worthy Keeler ?Abelino Derrick ?Weeks in Treatment: 5 ?Compression Therapy Performed for Wound Assessment: Wound #1 Right,Posterior Lower Leg ?Performed By: Clinician Deon Pilling, RN ?Compression Type: Three Layer ?Post Procedure Diagnosis ?Same as Pre-procedure ?Electronic Signature(s) ?Signed: 02/13/2022 5:47:39 PM By: Deon Pilling RN, BSN ?Entered By: Deon Pilling on 02/13/2022 08:20:07 ?-------------------------------------------------------------------------------- ?Encounter Discharge Information Details ?Patient Name: ?Date of Service: ?DUNWOODY, Konner 02/13/2022 8:15 A M ?Medical Record Number: [id number] ?Patient Account Number: [id number] ?Date of Birth/Sex: ?Treating RN: ?01-27-1956 (66 y.o. M) Rolin Barry, Bobbi ?Primary Care Caster Fayette: Abelino Derrick ?Other Clinician: ?Referring Analisia Kingsford: ?Treating Shlomo Seres/Extender: Worthy Keeler ?Abelino Derrick ?Weeks in Treatment: 5 ?Encounter Discharge Information Items ?Discharge Condition: Stable ?Ambulatory Status: Kasandra Knudsen ?Discharge Destination: Home ?Transportation: Private Auto ?Accompanied By: self ?Schedule Follow-up Appointment: Yes ?Clinical Summary of Care: ?Electronic Signature(s) ?Signed: 02/13/2022 5:47:39 PM By: Deon Pilling RN, BSN ?Entered By: Deon Pilling on 02/13/2022 08:22:47 ?-------------------------------------------------------------------------------- ?Lower Extremity Assessment Details ?Patient Name: ?Date of Service: ?KUAN, Isay 02/13/2022 8:15 A M ?Medical Record Number: [id number] ?Patient Account Number: [id number] ?Date of Birth/Sex: ?Treating RN: ?13-Oct-1956 (66 y.o. M) Rolin Barry, Bobbi ?Primary Care Karinne Schmader: Abelino Derrick ?Other Clinician: ?Referring Aahana Elza: ?Treating Blaze Sandin/Extender: Worthy Keeler ?Abelino Derrick ?Weeks in Treatment: 5 ?Edema Assessment ?Assessed: [Left: No] [Right: Yes] ?Edema: [Left: N] [Right: o] ?Calf ?Left: Right: ?Point of Measurement: 39 cm From Medial Instep 37  cm ?Ankle ?Left: Right: ?Point of Measurement: 15 cm From Medial Instep 25 cm ?Vascular Assessment ?Pulses: ?Dorsalis Pedis ?Palpable: [Right:Yes] ?Electronic Signature(s) ?Signed: 02/13/2022 5:47:39 PM By: Deon Pilling RN, BSN ?Entered By: Deon Pilling on 02/13/2022 08:10:02 ?-------------------------------------------------------------------------------- ?Multi-Disciplinary Care Plan Details ?Patient Name: ?Date of Service: ?LACKO, Jeromey 02/13/2022 8:15 A M ?Medical Record Number: [id number] ?Patient Account Number: [id number] ?Date of Birth/Sex: ?Treating RN: ?[birth year] (66 y.o. M) Rolin Barry, Bobbi ?Primary Care Maricsa Sammons: Abelino Derrick ?Other Clinician: ?Referring Mataya Kilduff: ?Treating Sagan Wurzel/Extender: Worthy Keeler ?Abelino Derrick ?Weeks in Treatment: 5 ?Active  Inactive ?Nutrition ?Nursing Diagnoses: ?Potential for alteratiion in Nutrition/Potential for imbalanced nutrition ?Goals: ?Patient/caregiver agrees to and verbalizes understanding of need to obtain nutritional consultation ?Date Initiated: 01/09/2022 ?T arget Resolution Date: 03/01/2022 ?Goal Status: Active ?Interventions: ?Assess HgA1c results as ordered upon admission and as needed ?Provide education on elevated blood sugars and impact on wound healing ?Provide education on nutrition ?Treatment Activities: ?Obtain HgA1c : 01/09/2022 ?Notes: ?Pain, Acute or Chronic ?Nursing Diagnoses: ?Pain, acute or chronic: actual or potential ?Potential alteration in comfort, pain ?Goals: ?Patient will verbalize adequate pain control and receive pain control interventions during procedures as needed ?Date Initiated: 01/09/2022 ?Target Resolution Date: 02/27/2022 ?Goal Status: Active ?Patient/caregiver will verbalize comfort level met ?Date Initiated: 01/09/2022 ?Target Resolution Date: 02/27/2022 ?Goal Status: Active ?Interventions: ?Encourage patient to take pain medications as prescribed ?Provide education on pain management ?Reposition patient for comfort ?Treatment  Activities: ?Administer pain control measures as ordered : 01/09/2022 ?Notes: ?Electronic Signature(s) ?Signed: 02/13/2022 5:47:39 PM By: Deon Pilling RN, BSN ?Entered By: Deon Pilling on 02/13/2022 08:12:13 ?-------------------------------------------------------------------------------- ?Pain Assessment Details ?Patient Name: ?Date of Service: ?NETHERTON, [place] 02/13/2022 8:15 A M ?Medical Record Number: [id number] ?Patient Account Number: [id number] ?Date of Birth/Sex: ?Treating RN: ?1956/02/07 (66 y.o. M) Rolin Barry, Bobbi ?Primary Care Tarini Carrier: Abelino Derrick ?Other Clinician: ?Referring Johnthomas Lader: ?Treating Annjeanette Sarwar/Extender: Worthy Keeler ?Abelino Derrick ?Weeks in Treatment: 5 ?Active Problems ?Location of Pain Severity and Description of Pain ?Patient Has Paino No ?Site Locations ?Rate the pain. ?Current Pain Level: 0 ?Pain Management and Medication ?Current Pain Management: ?Medication: No ?Cold Application: No ?Rest: No ?Massage: No ?Activity: No ?T.E.N.S.: No ?Heat Application: No ?Leg drop or elevation: No ?Is the Current Pain Management Adequate: Adequate ?How does your wound impact your activities of daily livingo ?Sleep: No ?Bathing: No ?Appetite: No ?Relationship With Others: No ?Bladder Continence: No ?Emotions: No ?Bowel Continence: No ?Work: No ?Toileting: No ?Drive: No ?Dressing: No ?Hobbies: No ?Electronic Signature(s) ?Signed: 02/13/2022 5:47:39 PM By: Deon Pilling RN, BSN ?Entered By: Deon Pilling on 02/13/2022 08:07:28 ?-------------------------------------------------------------------------------- ?Patient/Caregiver Education Details ?Patient Name: ?Date of Service: ?Deardorff, Dailen 4/12/2023andnbsp8:15 A M ?Medical Record Number: [id number] ?Patient Account Number: [id number] ?Date of Birth/Gender: ?Treating RN: ?December 11, 1955 (66 y.o. M) Rolin Barry, Bobbi ?Primary Care Physician: Abelino Derrick ?Other Clinician: ?Referring Physician: ?Treating Physician/Extender: Worthy Keeler ?Abelino Derrick ?Weeks in  Treatment: 5 ?Education Assessment ?Education Provided To: ?Patient ?Education Topics Provided ?Nutrition: ?Handouts: Nutrition ?Methods: Explain/Verbal ?Responses: State content correctly ?Electronic Signature(s) ?Signed:

## 2022-02-15 ENCOUNTER — Telehealth: Payer: Self-pay | Admitting: Family Medicine

## 2022-02-15 DIAGNOSIS — I129 Hypertensive chronic kidney disease with stage 1 through stage 4 chronic kidney disease, or unspecified chronic kidney disease: Secondary | ICD-10-CM | POA: Diagnosis not present

## 2022-02-15 DIAGNOSIS — G4730 Sleep apnea, unspecified: Secondary | ICD-10-CM | POA: Diagnosis not present

## 2022-02-15 DIAGNOSIS — Z86711 Personal history of pulmonary embolism: Secondary | ICD-10-CM | POA: Diagnosis not present

## 2022-02-15 DIAGNOSIS — Z86718 Personal history of other venous thrombosis and embolism: Secondary | ICD-10-CM | POA: Diagnosis not present

## 2022-02-15 DIAGNOSIS — E1122 Type 2 diabetes mellitus with diabetic chronic kidney disease: Secondary | ICD-10-CM | POA: Diagnosis not present

## 2022-02-15 DIAGNOSIS — N1832 Chronic kidney disease, stage 3b: Secondary | ICD-10-CM | POA: Diagnosis not present

## 2022-02-15 DIAGNOSIS — Z794 Long term (current) use of insulin: Secondary | ICD-10-CM | POA: Diagnosis not present

## 2022-02-15 DIAGNOSIS — E785 Hyperlipidemia, unspecified: Secondary | ICD-10-CM | POA: Diagnosis not present

## 2022-02-15 DIAGNOSIS — M545 Low back pain, unspecified: Secondary | ICD-10-CM | POA: Diagnosis not present

## 2022-02-15 DIAGNOSIS — M1712 Unilateral primary osteoarthritis, left knee: Secondary | ICD-10-CM | POA: Diagnosis not present

## 2022-02-15 DIAGNOSIS — N179 Acute kidney failure, unspecified: Secondary | ICD-10-CM | POA: Diagnosis not present

## 2022-02-15 DIAGNOSIS — Z7985 Long-term (current) use of injectable non-insulin antidiabetic drugs: Secondary | ICD-10-CM | POA: Diagnosis not present

## 2022-02-15 DIAGNOSIS — L03115 Cellulitis of right lower limb: Secondary | ICD-10-CM | POA: Diagnosis not present

## 2022-02-15 DIAGNOSIS — Z7901 Long term (current) use of anticoagulants: Secondary | ICD-10-CM | POA: Diagnosis not present

## 2022-02-15 NOTE — Telephone Encounter (Signed)
Leigh from [facility] is needing a verbal order to recertify his wound care for his R calf. Please advise Leigh at [phone] ?

## 2022-02-15 NOTE — Telephone Encounter (Signed)
Verbal order given  

## 2022-02-19 DIAGNOSIS — Z86711 Personal history of pulmonary embolism: Secondary | ICD-10-CM | POA: Diagnosis not present

## 2022-02-19 DIAGNOSIS — N179 Acute kidney failure, unspecified: Secondary | ICD-10-CM | POA: Diagnosis not present

## 2022-02-19 DIAGNOSIS — Z794 Long term (current) use of insulin: Secondary | ICD-10-CM | POA: Diagnosis not present

## 2022-02-19 DIAGNOSIS — Z86718 Personal history of other venous thrombosis and embolism: Secondary | ICD-10-CM | POA: Diagnosis not present

## 2022-02-19 DIAGNOSIS — I129 Hypertensive chronic kidney disease with stage 1 through stage 4 chronic kidney disease, or unspecified chronic kidney disease: Secondary | ICD-10-CM | POA: Diagnosis not present

## 2022-02-19 DIAGNOSIS — E1122 Type 2 diabetes mellitus with diabetic chronic kidney disease: Secondary | ICD-10-CM | POA: Diagnosis not present

## 2022-02-19 DIAGNOSIS — M1712 Unilateral primary osteoarthritis, left knee: Secondary | ICD-10-CM | POA: Diagnosis not present

## 2022-02-19 DIAGNOSIS — E785 Hyperlipidemia, unspecified: Secondary | ICD-10-CM | POA: Diagnosis not present

## 2022-02-19 DIAGNOSIS — L03115 Cellulitis of right lower limb: Secondary | ICD-10-CM | POA: Diagnosis not present

## 2022-02-19 DIAGNOSIS — Z7985 Long-term (current) use of injectable non-insulin antidiabetic drugs: Secondary | ICD-10-CM | POA: Diagnosis not present

## 2022-02-19 DIAGNOSIS — N1832 Chronic kidney disease, stage 3b: Secondary | ICD-10-CM | POA: Diagnosis not present

## 2022-02-19 DIAGNOSIS — M545 Low back pain, unspecified: Secondary | ICD-10-CM | POA: Diagnosis not present

## 2022-02-19 DIAGNOSIS — G4730 Sleep apnea, unspecified: Secondary | ICD-10-CM | POA: Diagnosis not present

## 2022-02-19 DIAGNOSIS — Z7901 Long term (current) use of anticoagulants: Secondary | ICD-10-CM | POA: Diagnosis not present

## 2022-02-20 ENCOUNTER — Encounter (HOSPITAL_BASED_OUTPATIENT_CLINIC_OR_DEPARTMENT_OTHER): Payer: Medicare Other | Admitting: Physician Assistant

## 2022-02-20 DIAGNOSIS — E11622 Type 2 diabetes mellitus with other skin ulcer: Secondary | ICD-10-CM | POA: Diagnosis not present

## 2022-02-20 DIAGNOSIS — I890 Lymphedema, not elsewhere classified: Secondary | ICD-10-CM | POA: Diagnosis not present

## 2022-02-20 DIAGNOSIS — Z86718 Personal history of other venous thrombosis and embolism: Secondary | ICD-10-CM | POA: Diagnosis not present

## 2022-02-20 DIAGNOSIS — L97812 Non-pressure chronic ulcer of other part of right lower leg with fat layer exposed: Secondary | ICD-10-CM | POA: Diagnosis not present

## 2022-02-20 DIAGNOSIS — E11621 Type 2 diabetes mellitus with foot ulcer: Secondary | ICD-10-CM | POA: Diagnosis not present

## 2022-02-20 DIAGNOSIS — I872 Venous insufficiency (chronic) (peripheral): Secondary | ICD-10-CM | POA: Diagnosis not present

## 2022-02-20 DIAGNOSIS — Z7901 Long term (current) use of anticoagulants: Secondary | ICD-10-CM | POA: Diagnosis not present

## 2022-02-20 DIAGNOSIS — L97212 Non-pressure chronic ulcer of right calf with fat layer exposed: Secondary | ICD-10-CM | POA: Diagnosis not present

## 2022-02-20 DIAGNOSIS — E1140 Type 2 diabetes mellitus with diabetic neuropathy, unspecified: Secondary | ICD-10-CM | POA: Diagnosis not present

## 2022-02-20 NOTE — Progress Notes (Addendum)
[place], [place] ([id number]) ?Visit Report for 02/20/2022 ?Chief Complaint Document Details ?Patient Name: Date of Service: ?[name], [name] 02/20/2022 8:15 A M ?Medical Record Number: [id number] ?Patient Account Number: [id number] ?Date of Birth/Sex: Treating RN: ?26-Jan-1956 (66 y.o. M) Rolin Barry, Bobbi ?Primary Care Provider: Abelino Derrick Other Clinician: ?Referring Provider: ?Treating Provider/Extender: Worthy Keeler ?Abelino Derrick ?Weeks in Treatment: 6 ?Information Obtained from: Patient ?Chief Complaint ?Right LE Ulcer ?Electronic Signature(s) ?Signed: 02/20/2022 8:10:57 AM By: Worthy Keeler PA-C ?Entered By: Worthy Keeler on 02/20/2022 08:10:57 ?-------------------------------------------------------------------------------- ?HPI Details ?Patient Name: Date of Service: ?[name], [name] 02/20/2022 8:15 A M ?Medical Record Number: [id number] ?Patient Account Number: [id number] ?Date of Birth/Sex: Treating RN: ?[birth year] (66 y.o. M) Rolin Barry, Bobbi ?Primary Care Provider: Abelino Derrick Other Clinician: ?Referring Provider: ?Treating Provider/Extender: Worthy Keeler ?Abelino Derrick ?Weeks in Treatment: 6 ?History of Present Illness ?HPI Description: 01/09/2022 upon evaluation today patient appears to be doing well with regard to his leg compared to where things have been. I did review his ?note and it appears he had significant cellulitis. He was actually in the hospital due to sepsis from February 19 February 13. The good news is he recovered ?from that he was placed on Duricef for a time and then this was after the Keflex. The Keflex was really not effective. Eventually he was transitioned to ?Bactrim which is what he has been taking currently he tells me when they switch to that is when things really got better. That was due to a culture which showed ?Enterobacter as the main organism with Klebsiella being a secondary. Nonetheless in the end I do feel like that the Bactrim has done a good job for him based ?on what he  is telling me and what I am seeing. He did have a CT scan which was on 12/07/2021 which showed cellulitis fortunately nothing deeper structure was ?infected and no abscess. He also had a DVT study on 12/07/2020 which was negative. On 2/24 is when the culture was performed. His most recent hemoglobin ?A1c was 6.4 according to his primary care provider and the patient currently is on the Bactrim for a couple more days along with an Unna boot and alginate ?which appears to been the order although what he had was Curlex and Coban on when he came in today with alginate. 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 ?01/23/2022 upon evaluation today patient appears to be doing excellent in regard to his leg ulcer. This is significantly improved compared to his first visit. ?Overall both he and I are both extremely pleased with where things stand today his pain is better the infection seems to be cleared and overall I think you are ?on the right path. ?01/30/2022 upon evaluation today patient appears to be doing well currently in regard to his leg ulcers. He has been tolerating the dressing changes without ?complication. Fortunately I do not see any evidence of active infection locally or systemically which is great news. No fevers, chills, nausea, vomiting, or ?diarrhea. ?02-06-2022 on evaluation today patient's wound is actually showing signs of significant improvement. We are getting very close to resolution on I am extremely ?pleased with where we stand today. I do not see any signs of active infection locally or systemically at this time which is great news. ?02-13-2022 upon evaluation today patient appears to be doing well with regard to his wounds. He has been tolerating  the dressing changes without complication ?overall very pleased with where we stand today. ?02-20-2022 upon evaluation today  patient appears to be doing excellent in regard to his leg. In fact this is significantly smaller and overall I feel like we are doing ?quite well. He is very close to complete resolution. With that being said I do believe we still have a little ways to go to get this completely closed. I think within ?the next 1-2 weeks would likely get a be done. Home health again only wrapped him from the ankle up not including the foot. Honestly I think at this point ?home health is probably not necessary and we can just discontinue that altogether. ?Electronic Signature(s) ?Signed: 02/20/2022 8:29:18 AM By: Worthy Keeler PA-C ?Entered By: Worthy Keeler on 02/20/2022 08:29:17 ?-------------------------------------------------------------------------------- ?Physical Exam Details ?Patient Name: Date of Service: ?[name], [name] 02/20/2022 8:15 A M ?Medical Record Number: [id number] ?Patient Account Number: [id number] ?Date of Birth/Sex: Treating RN: ?12/30/1955 (66 y.o. M) Rolin Barry, Bobbi ?Primary Care Provider: Abelino Derrick Other Clinician: ?Referring Provider: ?Treating Provider/Extender: Worthy Keeler ?Abelino Derrick ?Weeks in Treatment: 6 ?Constitutional ?Well-nourished and well-hydrated in no acute distress. ?Respiratory ?normal breathing without difficulty. ?Psychiatric ?this patient is able to make decisions and demonstrates good insight into disease process. Alert and Oriented x 3. pleasant and cooperative. ?Notes ?Upon inspection patient's wound bed actually showed signs of good granulation and epithelization at this point. The wound that is remaining is extremely small ?and seems to be doing quite well. Overall I am extremely pleased with where we stand today. ?Electronic Signature(s) ?Signed: 02/20/2022 8:29:31 AM By: Worthy Keeler PA-C ?Entered By: Worthy Keeler on 02/20/2022 08:29:30 ?-------------------------------------------------------------------------------- ?Physician Orders Details ?Patient Name: Date of  Service: ?[name], [name] 02/20/2022 8:15 A M ?Medical Record Number: [id number] ?Patient Account Number: [id number] ?Date of Birth/Sex: Treating RN: ?November 23, 1955 (66 y.o. 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 Margarita Grizzle and Bobbi Room 8 02/27/2022 0815 ?Return A ?Other: - Bring in compression stockings next week. ?***leg measurements and purchase stockings this week.*** ?Edema Control - Lymphedema / SCD / Other ?Elevate legs to the level of the heart or above for 30 minutes daily and/or when sitting, a frequency of: - 3-4 times a day throughout the day. ?Avoid standing for long periods of time. ?Exercise regularly ?Moisturize legs daily. - left leg every night before bed. ?Home Health ?Discontinue home health for wound care. ?Other Home Health Orders/Instructions: - [facility] home [facility] ?Wound Treatment ?Wound #1 - Lower Leg Wound Laterality: Right, Posterior ?Cleanser: Soap and Water 1 x Per Week/30 Days ?Discharge Instructions: May shower and wash wound with dial antibacterial soap and water prior to dressing change. ?Cleanser: Wound Cleanser 1 x Per Week/30 Days ?Discharge Instructions: Cleanse the wound with wound cleanser prior to applying a clean dressing using gauze sponges, not tissue or cotton balls. ?Peri-Wound Care: Sween Lotion (Moisturizing lotion) 1 x Per Week/30 Days ?Discharge Instructions: Apply  moisturizing lotion as directed ?Prim Dressing: KerraCel Ag Gelling Fiber Dressing, 2x2  in (silver alginate) 1 x Per Week/30 Days ?ary ?Discharge Instructions: Apply silver alginate to wound bed as instructed

## 2022-02-20 NOTE — Progress Notes (Signed)
[place], [place] ([id number]) ?Visit Report for 02/20/2022 ?Arrival Information Details ?Patient Name: Date of Service: ?[name], [name] 02/20/2022 8:15 A M ?Medical Record Number: [id number] ?Patient Account Number: [id number] ?Date of Birth/Sex: Treating RN: ?January 26, 1956 (66 y.o. M) Rolin Barry, Bobbi ?Primary Care Matthewjames Petrasek: Abelino Derrick Other Clinician: ?Referring Genever Hentges: ?Treating Mataya Kilduff/Extender: Worthy Keeler ?Abelino Derrick ?Weeks in Treatment: 6 ?Visit Information History Since Last Visit ?Added or deleted any medications: No ?Patient Arrived: Kasandra Knudsen ?Any new allergies or adverse reactions: No ?Arrival Time: 08:01 ?Had a fall or experienced change in No ?Accompanied By: self ?activities of daily living that may affect ?Transfer Assistance: None ?risk of falls: ?Patient Identification Verified: Yes ?Signs or symptoms of abuse/neglect since last visito No ?Secondary Verification Process Completed: Yes ?Hospitalized since last visit: No ?Patient Requires Transmission-Based Precautions: No ?Implantable device outside of the clinic excluding No ?Patient Has Alerts: Yes ?cellular tissue based products placed in the center ?Patient Alerts: Patient on Blood Thinner since last visit: ?Has Dressing in Place as Prescribed: Yes ?Has Compression in Place as Prescribed: No ?Pain Present Now: Yes ?Notes ?right foot was not wrap in compression layer again by home health. Will ensure clear orders to wrap both foot and leg. ?Electronic Signature(s) ?Signed: 02/20/2022 4:48:33 PM By: Deon Pilling RN, BSN ?Entered By: Deon Pilling on 02/20/2022 08:07:11 ?-------------------------------------------------------------------------------- ?Compression Therapy Details ?Patient Name: Date of Service: ?[name], [name] 02/20/2022 8:15 A M ?Medical Record Number: [id number] ?Patient Account Number: [id number] ?Date of Birth/Sex: Treating RN: ?August 01, 1956 (66 y.o. M) Rolin Barry, Bobbi ?Primary Care Ashey Tramontana: Abelino Derrick Other Clinician: ?Referring  Abagael Kramm: ?Treating Kaiyan Luczak/Extender: Worthy Keeler ?Abelino Derrick ?Weeks in Treatment: 6 ?Compression Therapy Performed for Wound Assessment: Wound #1 Right,Posterior Lower Leg ?Performed By: Clinician Deon Pilling, RN ?Compression Type: Three Layer ?Post Procedure Diagnosis ?Same as Pre-procedure ?Electronic Signature(s) ?Signed: 02/20/2022 4:48:33 PM By: Deon Pilling RN, BSN ?Entered By: Deon Pilling on 02/20/2022 08:26:18 ?-------------------------------------------------------------------------------- ?Encounter Discharge Information Details ?Patient Name: ?Date of Service: ?[name], [name] 02/20/2022 8:15 A M ?Medical Record Number: [id number] ?Patient Account Number: [id number] ?Date of Birth/Sex: ?Treating RN: ?[birth year] (66 y.o. M) Rolin Barry, Bobbi ?Primary Care Rosmarie Esquibel: Abelino Derrick ?Other Clinician: ?Referring Chelbie Jarnagin: ?Treating Jamal Pavon/Extender: Worthy Keeler ?Abelino Derrick ?Weeks in Treatment: 6 ?Encounter Discharge Information Items ?Discharge Condition: Stable ?Ambulatory Status: Kasandra Knudsen ?Discharge Destination: Home ?Transportation: Private Auto ?Accompanied By: self ?Schedule Follow-up Appointment: Yes ?Clinical Summary of Care: ?Electronic Signature(s) ?Signed: 02/20/2022 4:48:33 PM By: Deon Pilling RN, BSN ?Entered By: Deon Pilling on 02/20/2022 08:28:59 ?-------------------------------------------------------------------------------- ?Lower Extremity Assessment Details ?Patient Name: ?Date of Service: ?[name], [name] 02/20/2022 8:15 A M ?Medical Record Number: [id number] ?Patient Account Number: [id number] ?Date of Birth/Sex: ?Treating RN: ?August 23, 1956 (66 y.o. M) Rolin Barry, Bobbi ?Primary Care Vanessa Alesi: Abelino Derrick ?Other Clinician: ?Referring Dheeraj Hail: ?Treating Soren Lazarz/Extender: Worthy Keeler ?Abelino Derrick ?Weeks in Treatment: 6 ?Edema Assessment ?Assessed: [Left: No] [Right: Yes] ?Edema: [Left: Ye] [Right: s] ?Calf ?Left: Right: ?Point of Measurement: 39 cm From Medial Instep 36  cm ?Ankle ?Left: Right: ?Point of Measurement: 15 cm From Medial Instep 24 cm ?Knee To Floor ?Left: Right: ?From Medial Instep 49 cm ?Vascular Assessment ?Pulses: ?Dorsalis Pedis ?Palpable: [Right:Yes] ?Electronic Signature(s) ?Signed: 02/20/2022 4:48:33 PM By: Deon Pilling RN, BSN ?Entered By: Deon Pilling on 02/20/2022 08:18:35 ?-------------------------------------------------------------------------------- ?Multi-Disciplinary Care Plan Details ?Patient Name: ?Date of Service: ?[name], [name] 02/20/2022 8:15 A M ?Medical Record Number: [id number] ?Patient Account Number: [id number] ?Date of Birth/Sex: ?Treating RN: ?1956/05/02 (66 y.o. M) Rolin Barry, Bobbi ?Primary Care Maclin Guerrette: Abelino Derrick ?Other Clinician: ?Referring Tyron Manetta: ?Treating Rene Sizelove/Extender:  Melburn Hake, [place] ?Abelino Derrick ?Weeks in Treatment: 6 ?Active Inactive ?Nutrition ?Nursing Diagnoses: ?Potential for alteratiion in Nutrition/Potential for imbalanced nutrition ?Goals: ?Patient/caregiver agrees to and verbalizes understanding of need to obtain nutritional consultation ?Date Initiated: 01/09/2022 ?T arget Resolution Date: 03/01/2022 ?Goal Status: Active ?Interventions: ?Assess HgA1c results as ordered upon admission and as needed ?Provide education on elevated blood sugars and impact on wound healing ?Provide education on nutrition ?Treatment Activities: ?Education provided on Nutrition : 02/13/2022 ?Obtain HgA1c : 01/09/2022 ?Notes: ?Pain, Acute or Chronic ?Nursing Diagnoses: ?Pain, acute or chronic: actual or potential ?Potential alteration in comfort, pain ?Goals: ?Patient will verbalize adequate pain control and receive pain control interventions during procedures as needed ?Date Initiated: 01/09/2022 ?Target Resolution Date: 02/27/2022 ?Goal Status: Active ?Patient/caregiver will verbalize comfort level met ?Date Initiated: 01/09/2022 ?Target Resolution Date: 02/27/2022 ?Goal Status: Active ?Interventions: ?Encourage patient to take pain medications as  prescribed ?Provide education on pain management ?Reposition patient for comfort ?Treatment Activities: ?Administer pain control measures as ordered : 01/09/2022 ?Notes: ?Electronic Signature(s) ?Signed: 02/20/2022 4:48:33 PM By: Deon Pilling RN, BSN ?Entered By: Deon Pilling on 02/20/2022 08:15:55 ?-------------------------------------------------------------------------------- ?Pain Assessment Details ?Patient Name: ?Date of Service: ?[name], [name] 02/20/2022 8:15 A M ?Medical Record Number: [id number] ?Patient Account Number: [id number] ?Date of Birth/Sex: ?Treating RN: ?[birth year] (66 y.o. M) Rolin Barry, Bobbi ?Primary Care Argelia Formisano: Abelino Derrick ?Other Clinician: ?Referring Yutaka Holberg: ?Treating Vibhav Waddill/Extender: Worthy Keeler ?Abelino Derrick ?Weeks in Treatment: 6 ?Active Problems ?Location of Pain Severity and Description of Pain ?Patient Has Paino Yes ?Site Locations ?Pain Location: ?Generalized Pain ?Rate the pain. ?Current Pain Level: 4 ?Pain Management and Medication ?Current Pain Management: ?Medication: Yes ?Cold Application: No ?Rest: Yes ?Massage: No ?Activity: No ?T.E.N.S.: No ?Heat Application: No ?Leg drop or elevation: No ?Is the Current Pain Management Adequate: Adequate ?How does your wound impact your activities of daily livingo ?Sleep: No ?Bathing: No ?Appetite: No ?Relationship With Others: No ?Bladder Continence: No ?Emotions: No ?Bowel Continence: No ?Work: No ?Toileting: No ?Drive: No ?Dressing: No ?Hobbies: No ?Electronic Signature(s) ?Signed: 02/20/2022 4:48:33 PM By: Deon Pilling RN, BSN ?Entered By: Deon Pilling on 02/20/2022 08:13:35 ?-------------------------------------------------------------------------------- ?Patient/Caregiver Education Details ?Patient Name: ?Date of Service: ?[name], [name] 4/19/2023andnbsp8:15 A M ?Medical Record Number: [id number] ?Patient Account Number: [id number] ?Date of Birth/Gender: ?Treating RN: ?04/27/1956 (66 y.o. M) Rolin Barry, Bobbi ?Primary Care Physician:  Abelino Derrick ?Other Clinician: ?Referring Physician: ?Treating Physician/Extender: Worthy Keeler ?Abelino Derrick ?Weeks in Treatment: 6 ?Education Assessment ?Education Provided To: ?Patient ?Education Topics Pro

## 2022-02-21 ENCOUNTER — Other Ambulatory Visit: Payer: Self-pay | Admitting: Family Medicine

## 2022-02-21 DIAGNOSIS — N179 Acute kidney failure, unspecified: Secondary | ICD-10-CM | POA: Diagnosis not present

## 2022-02-21 DIAGNOSIS — Z86718 Personal history of other venous thrombosis and embolism: Secondary | ICD-10-CM | POA: Diagnosis not present

## 2022-02-21 DIAGNOSIS — Z7985 Long-term (current) use of injectable non-insulin antidiabetic drugs: Secondary | ICD-10-CM | POA: Diagnosis not present

## 2022-02-21 DIAGNOSIS — M1712 Unilateral primary osteoarthritis, left knee: Secondary | ICD-10-CM | POA: Diagnosis not present

## 2022-02-21 DIAGNOSIS — I129 Hypertensive chronic kidney disease with stage 1 through stage 4 chronic kidney disease, or unspecified chronic kidney disease: Secondary | ICD-10-CM | POA: Diagnosis not present

## 2022-02-21 DIAGNOSIS — M545 Low back pain, unspecified: Secondary | ICD-10-CM | POA: Diagnosis not present

## 2022-02-21 DIAGNOSIS — N1832 Chronic kidney disease, stage 3b: Secondary | ICD-10-CM | POA: Diagnosis not present

## 2022-02-21 DIAGNOSIS — E785 Hyperlipidemia, unspecified: Secondary | ICD-10-CM | POA: Diagnosis not present

## 2022-02-21 DIAGNOSIS — L03115 Cellulitis of right lower limb: Secondary | ICD-10-CM | POA: Diagnosis not present

## 2022-02-21 DIAGNOSIS — Z86711 Personal history of pulmonary embolism: Secondary | ICD-10-CM | POA: Diagnosis not present

## 2022-02-21 DIAGNOSIS — Z7901 Long term (current) use of anticoagulants: Secondary | ICD-10-CM | POA: Diagnosis not present

## 2022-02-21 DIAGNOSIS — Z794 Long term (current) use of insulin: Secondary | ICD-10-CM | POA: Diagnosis not present

## 2022-02-21 DIAGNOSIS — E1122 Type 2 diabetes mellitus with diabetic chronic kidney disease: Secondary | ICD-10-CM | POA: Diagnosis not present

## 2022-02-21 DIAGNOSIS — G4730 Sleep apnea, unspecified: Secondary | ICD-10-CM | POA: Diagnosis not present

## 2022-02-25 DIAGNOSIS — G4730 Sleep apnea, unspecified: Secondary | ICD-10-CM | POA: Diagnosis not present

## 2022-02-25 DIAGNOSIS — Z794 Long term (current) use of insulin: Secondary | ICD-10-CM | POA: Diagnosis not present

## 2022-02-25 DIAGNOSIS — Z86718 Personal history of other venous thrombosis and embolism: Secondary | ICD-10-CM | POA: Diagnosis not present

## 2022-02-25 DIAGNOSIS — E785 Hyperlipidemia, unspecified: Secondary | ICD-10-CM | POA: Diagnosis not present

## 2022-02-25 DIAGNOSIS — Z7901 Long term (current) use of anticoagulants: Secondary | ICD-10-CM | POA: Diagnosis not present

## 2022-02-25 DIAGNOSIS — N179 Acute kidney failure, unspecified: Secondary | ICD-10-CM | POA: Diagnosis not present

## 2022-02-25 DIAGNOSIS — M1712 Unilateral primary osteoarthritis, left knee: Secondary | ICD-10-CM | POA: Diagnosis not present

## 2022-02-25 DIAGNOSIS — I129 Hypertensive chronic kidney disease with stage 1 through stage 4 chronic kidney disease, or unspecified chronic kidney disease: Secondary | ICD-10-CM | POA: Diagnosis not present

## 2022-02-25 DIAGNOSIS — E1122 Type 2 diabetes mellitus with diabetic chronic kidney disease: Secondary | ICD-10-CM | POA: Diagnosis not present

## 2022-02-25 DIAGNOSIS — M545 Low back pain, unspecified: Secondary | ICD-10-CM | POA: Diagnosis not present

## 2022-02-25 DIAGNOSIS — L03115 Cellulitis of right lower limb: Secondary | ICD-10-CM | POA: Diagnosis not present

## 2022-02-25 DIAGNOSIS — Z86711 Personal history of pulmonary embolism: Secondary | ICD-10-CM | POA: Diagnosis not present

## 2022-02-25 DIAGNOSIS — Z7985 Long-term (current) use of injectable non-insulin antidiabetic drugs: Secondary | ICD-10-CM | POA: Diagnosis not present

## 2022-02-25 DIAGNOSIS — N1832 Chronic kidney disease, stage 3b: Secondary | ICD-10-CM | POA: Diagnosis not present

## 2022-02-25 NOTE — Telephone Encounter (Signed)
Done

## 2022-02-27 ENCOUNTER — Encounter (HOSPITAL_BASED_OUTPATIENT_CLINIC_OR_DEPARTMENT_OTHER): Payer: Medicare Other | Admitting: Physician Assistant

## 2022-02-27 ENCOUNTER — Ambulatory Visit: Payer: Medicare Other | Admitting: Internal Medicine

## 2022-02-27 DIAGNOSIS — L97212 Non-pressure chronic ulcer of right calf with fat layer exposed: Secondary | ICD-10-CM | POA: Diagnosis not present

## 2022-02-27 DIAGNOSIS — E1140 Type 2 diabetes mellitus with diabetic neuropathy, unspecified: Secondary | ICD-10-CM | POA: Diagnosis not present

## 2022-02-27 DIAGNOSIS — I890 Lymphedema, not elsewhere classified: Secondary | ICD-10-CM | POA: Diagnosis not present

## 2022-02-27 DIAGNOSIS — L97812 Non-pressure chronic ulcer of other part of right lower leg with fat layer exposed: Secondary | ICD-10-CM | POA: Diagnosis not present

## 2022-02-27 DIAGNOSIS — E11621 Type 2 diabetes mellitus with foot ulcer: Secondary | ICD-10-CM | POA: Diagnosis not present

## 2022-02-27 DIAGNOSIS — Z7901 Long term (current) use of anticoagulants: Secondary | ICD-10-CM | POA: Diagnosis not present

## 2022-02-27 DIAGNOSIS — Z86718 Personal history of other venous thrombosis and embolism: Secondary | ICD-10-CM | POA: Diagnosis not present

## 2022-02-27 DIAGNOSIS — E11622 Type 2 diabetes mellitus with other skin ulcer: Secondary | ICD-10-CM | POA: Diagnosis not present

## 2022-02-27 DIAGNOSIS — I872 Venous insufficiency (chronic) (peripheral): Secondary | ICD-10-CM | POA: Diagnosis not present

## 2022-02-27 NOTE — Progress Notes (Signed)
[place], [place] ([id number]) ?Visit Report for 02/27/2022 ?Arrival Information Details ?Patient Name: Date of Service: ?[name], [name] 02/27/2022 8:15 A M ?Medical Record Number: [id number] ?Patient Account Number: [id number] ?Date of Birth/Sex: Treating RN: ?07/12/1956 (66 y.o. M) Rolin Barry, Bobbi ?Primary Care Maja Mccaffery: Abelino Derrick Other Clinician: ?Referring Joannie Medine: ?Treating Charbel Los/Extender: Worthy Keeler ?Abelino Derrick ?Weeks in Treatment: 7 ?Visit Information History Since Last Visit ?Added or deleted any medications: No ?Patient Arrived: [name] ?Any new allergies or adverse reactions: No ?Arrival Time: 08:15 ?Had a fall or experienced change in No ?Accompanied By: self ?activities of daily living that may affect ?Transfer Assistance: None ?risk of falls: ?Patient Identification Verified: Yes ?Signs or symptoms of abuse/neglect since last visito No ?Secondary Verification Process Completed: Yes ?Hospitalized since last visit: No ?Patient Requires Transmission-Based Precautions: No ?Implantable device outside of the clinic excluding No ?Patient Has Alerts: Yes ?cellular tissue based products placed in the center ?Patient Alerts: Patient on Blood Thinner since last visit: ?Has Dressing in Place as Prescribed: Yes ?Has Compression in Place as Prescribed: Yes ?Pain Present Now: No ?Electronic Signature(s) ?Signed: 02/27/2022 3:21:12 PM By: Deon Pilling RN, BSN ?Entered By: Deon Pilling on 02/27/2022 08:15:32 ?-------------------------------------------------------------------------------- ?Compression Therapy Details ?Patient Name: Date of Service: ?[name], [name] 02/27/2022 8:15 A M ?Medical Record Number: [id number] ?Patient Account Number: [id number] ?Date of Birth/Sex: Treating RN: ?04/03/1956 (66 y.o. M) Rolin Barry, Bobbi ?Primary Care Spirit Wernli: Abelino Derrick Other Clinician: ?Referring Juwana Thoreson: ?Treating Caine Barfield/Extender: Worthy Keeler ?Abelino Derrick ?Weeks in Treatment: 7 ?Compression Therapy Performed for Wound  Assessment: Wound #1 Right,Posterior Lower Leg ?Performed By: Clinician Deon Pilling, RN ?Compression Type: Three Layer ?Post Procedure Diagnosis ?Same as Pre-procedure ?Electronic Signature(s) ?Signed: 02/27/2022 3:21:12 PM By: Deon Pilling RN, BSN ?Entered By: Deon Pilling on 02/27/2022 08:32:10 ?-------------------------------------------------------------------------------- ?Encounter Discharge Information Details ?Patient Name: ?Date of Service: ?[name], [name] 02/27/2022 8:15 A M ?Medical Record Number: [id number] ?Patient Account Number: [id number] ?Date of Birth/Sex: ?Treating RN: ?11-08-1955 (66 y.o. M) Rolin Barry, Bobbi ?Primary Care Kylyn Sookram: Abelino Derrick ?Other Clinician: ?Referring Artha Stavros: ?Treating Dhanya Bogle/Extender: Worthy Keeler ?Abelino Derrick ?Weeks in Treatment: 7 ?Encounter Discharge Information Items ?Discharge Condition: Stable ?Ambulatory Status: [name] ?Discharge Destination: Home ?Transportation: Private Auto ?Accompanied By: self ?Schedule Follow-up Appointment: Yes ?Clinical Summary of Care: ?Electronic Signature(s) ?Signed: 02/27/2022 3:21:12 PM By: Deon Pilling RN, BSN ?Entered By: Deon Pilling on 02/27/2022 08:34:43 ?-------------------------------------------------------------------------------- ?Lower Extremity Assessment Details ?Patient Name: ?Date of Service: ?[name], [name] 02/27/2022 8:15 A M ?Medical Record Number: [id number] ?Patient Account Number: [id number] ?Date of Birth/Sex: ?Treating RN: ?08/13/1956 (66 y.o. M) Rolin Barry, Bobbi ?Primary Care Tylon Kemmerling: Abelino Derrick ?Other Clinician: ?Referring Ellysia Char: ?Treating Kearney Evitt/Extender: Worthy Keeler ?Abelino Derrick ?Weeks in Treatment: 7 ?Edema Assessment ?Assessed: [Left: No] [Right: Yes] ?Edema: [Left: N] [Right: o] ?Calf ?Left: Right: ?Point of Measurement: 39 cm From Medial Instep 38.5 cm ?Ankle ?Left: Right: ?Point of Measurement: 15 cm From Medial Instep 25 cm ?Vascular Assessment ?Pulses: ?Dorsalis Pedis ?Palpable:  [Right:Yes] ?Electronic Signature(s) ?Signed: 02/27/2022 3:21:12 PM By: Deon Pilling RN, BSN ?Entered By: Deon Pilling on 02/27/2022 08:19:07 ?-------------------------------------------------------------------------------- ?Multi-Disciplinary Care Plan Details ?Patient Name: ?Date of Service: ?[name], [name] 02/27/2022 8:15 A M ?Medical Record Number: [id number] ?Patient Account Number: [id number] ?Date of Birth/Sex: ?Treating RN: ?Sep 24, 1956 (66 y.o. M) Rolin Barry, Bobbi ?Primary Care Chord Takahashi: Abelino Derrick ?Other Clinician: ?Referring Ewin Rehberg: ?Treating Shiana Rappleye/Extender: Worthy Keeler ?Abelino Derrick ?Weeks in Treatment: 7 ?Active Inactive ?Nutrition ?Nursing Diagnoses: ?Potential for alteratiion in Nutrition/Potential for imbalanced nutrition ?Goals: ?Patient/caregiver agrees to and verbalizes understanding of need to obtain  nutritional consultation ?Date Initiated: 01/09/2022 ?T arget Resolution Date: 03/14/2022 ?Goal Status: Active ?Interventions: ?Assess HgA1c results as ordered upon admission and as needed ?Provide education on elevated blood sugars and impact on wound healing ?Provide education on nutrition ?Treatment Activities: ?Education provided on Nutrition : 02/13/2022 ?Obtain HgA1c : 01/09/2022 ?Notes: ?Pain, Acute or Chronic ?Nursing Diagnoses: ?Pain, acute or chronic: actual or potential ?Potential alteration in comfort, pain ?Goals: ?Patient will verbalize adequate pain control and receive pain control interventions during procedures as needed ?Date Initiated: 01/09/2022 ?Target Resolution Date: 03/13/2022 ?Goal Status: Active ?Patient/caregiver will verbalize comfort level met ?Date Initiated: 01/09/2022 ?Target Resolution Date: 03/13/2022 ?Goal Status: Active ?Interventions: ?Encourage patient to take pain medications as prescribed ?Provide education on pain management ?Reposition patient for comfort ?Treatment Activities: ?Administer pain control measures as ordered : 01/09/2022 ?Notes: ?Electronic  Signature(s) ?Signed: 02/27/2022 3:21:12 PM By: Deon Pilling RN, BSN ?Entered By: Deon Pilling on 02/27/2022 08:21:10 ?-------------------------------------------------------------------------------- ?Pain Assessment Details ?Patient Name: ?Date of Service: ?[name], [name] 02/27/2022 8:15 A M ?Medical Record Number: [id number] ?Patient Account Number: [id number] ?Date of Birth/Sex: ?Treating RN: ?13-Sep-1956 (66 y.o. M) Rolin Barry, Bobbi ?Primary Care Gavin Faivre: Abelino Derrick ?Other Clinician: ?Referring Maven Rosander: ?Treating Devoiry Corriher/Extender: Worthy Keeler ?Abelino Derrick ?Weeks in Treatment: 7 ?Active Problems ?Location of Pain Severity and Description of Pain ?Patient Has Paino No ?Site Locations ?Rate the pain. ?Current Pain Level: 0 ?Pain Management and Medication ?Current Pain Management: ?Medication: No ?Cold Application: No ?Rest: No ?Massage: No ?Activity: No ?T.E.N.S.: No ?Heat Application: No ?Leg drop or elevation: No ?Is the Current Pain Management Adequate: Adequate ?How does your wound impact your activities of daily livingo ?Sleep: No ?Bathing: No ?Appetite: No ?Relationship With Others: No ?Bladder Continence: No ?Emotions: No ?Bowel Continence: No ?Work: No ?Toileting: No ?Drive: No ?Dressing: No ?Hobbies: No ?Electronic Signature(s) ?Signed: 02/27/2022 3:21:12 PM By: Deon Pilling RN, BSN ?Entered By: Deon Pilling on 02/27/2022 08:18:57 ?-------------------------------------------------------------------------------- ?Patient/Caregiver Education Details ?Patient Name: ?Date of Service: ?[name], [name] 4/26/2023andnbsp8:15 A M ?Medical Record Number: [id number] ?Patient Account Number: [id number] ?Date of Birth/Gender: ?Treating RN: ?1956-06-11 (66 y.o. M) Rolin Barry, Bobbi ?Primary Care Physician: Abelino Derrick ?Other Clinician: ?Referring Physician: ?Treating Physician/Extender: Worthy Keeler ?Abelino Derrick ?Weeks in Treatment: 7 ?Education Assessment ?Education Provided To: ?Patient ?Education Topics  Provided ?Pain: ?Handouts: A Guide to Pain Control ?Methods: Explain/Verbal ?Responses: State content correctly ?Electronic Signature(s) ?Signed: 02/27/2022 3:21:12 PM By: Deon Pilling RN, BSN ?Entered By: Deon Pilling o

## 2022-02-27 NOTE — Progress Notes (Addendum)
[place], [place] ([id number]) ?Visit Report for 02/27/2022 ?Chief Complaint Document Details ?Patient Name: Date of Service: ?Glass, [name] 02/27/2022 8:15 A M ?Medical Record Number: [id number] ?Patient Account Number: [id number] ?Date of Birth/Sex: Treating RN: ?1956/04/03 (66 y.o. M) Rolin Barry, Bobbi ?Primary Care Provider: Abelino Derrick Other Clinician: ?Referring Provider: ?Treating Provider/Extender: Worthy Keeler ?Abelino Derrick ?Weeks in Treatment: 7 ?Information Obtained from: Patient ?Chief Complaint ?Right LE Ulcer ?Electronic Signature(s) ?Signed: 02/27/2022 8:30:17 AM By: Worthy Keeler PA-C ?Entered By: Worthy Keeler on 02/27/2022 08:30:16 ?-------------------------------------------------------------------------------- ?HPI Details ?Patient Name: Date of Service: ?PULLMAN, [name] 02/27/2022 8:15 A M ?Medical Record Number: [id number] ?Patient Account Number: [id number] ?Date of Birth/Sex: Treating RN: ?1956/04/25 (66 y.o. M) Rolin Barry, Bobbi ?Primary Care Provider: Abelino Derrick Other Clinician: ?Referring Provider: ?Treating Provider/Extender: Worthy Keeler ?Abelino Derrick ?Weeks in Treatment: 7 ?History of Present Illness ?HPI Description: 01/09/2022 upon evaluation today patient appears to be doing well with regard to his leg compared to where things have been. I did review his ?note and it appears he had significant cellulitis. He was actually in the hospital due to sepsis from February 19 February 13. The good news is he recovered ?from that he was placed on Duricef for a time and then this was after the Keflex. The Keflex was really not effective. Eventually he was transitioned to ?Bactrim which is what he has been taking currently he tells me when they switch to that is when things really got better. That was due to a culture which showed ?Enterobacter as the main organism with Klebsiella being a secondary. Nonetheless in the end I do feel like that the Bactrim has done a good job for him based ?on what he  is telling me and what I am seeing. He did have a CT scan which was on 12/07/2021 which showed cellulitis fortunately nothing deeper structure was ?infected and no abscess. He also had a DVT study on 12/07/2020 which was negative. On 2/24 is when the culture was performed. His most recent hemoglobin ?A1c was 6.4 according to his primary care provider and the patient currently is on the Bactrim for a couple more days along with an Unna boot and alginate ?which appears to been the order although what he had was Curlex and Coban on when he came in today with alginate. 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 ?01/23/2022 upon evaluation today patient appears to be doing excellent in regard to his leg ulcer. This is significantly improved compared to his first visit. ?Overall both he and I are both extremely pleased with where things stand today his pain is better the infection seems to be cleared and overall I think you are ?on the right path. ?01/30/2022 upon evaluation today patient appears to be doing well currently in regard to his leg ulcers. He has been tolerating the dressing changes without ?complication. Fortunately I do not see any evidence of active infection locally or systemically which is great news. No fevers, chills, nausea, vomiting, or ?diarrhea. ?02-06-2022 on evaluation today patient's wound is actually showing signs of significant improvement. We are getting very close to resolution on I am extremely ?pleased with where we stand today. I do not see any signs of active infection locally or systemically at this time which is great news. ?02-13-2022 upon evaluation today patient appears to be doing well with regard to his wounds. He has been tolerating  the dressing changes without complication ?overall very pleased with where we stand today. ?02-20-2022 upon evaluation today  patient appears to be doing excellent in regard to his leg. In fact this is significantly smaller and overall I feel like we are doing ?quite well. He is very close to complete resolution. With that being said I do believe we still have a little ways to go to get this completely closed. I think within ?the next 1-2 weeks would likely get a be done. Home health again only wrapped him from the ankle up not including the foot. Honestly I think at this point ?home health is probably not necessary and we can just discontinue that altogether. ?02-27-2022 upon evaluation today patient appears to be doing well with regard to his leg ulcer. He has been Tolerating the dressing changes without complication. ?Fortunately I do not see any evidence of infection locally or systemically which is great news overall very pleased with where we stand at this point. No ?fevers, chills, nausea, vomiting, or diarrhea. ?Electronic Signature(s) ?Signed: 02/27/2022 10:15:35 AM By: Worthy Keeler PA-C ?Entered By: Worthy Keeler on 02/27/2022 10:15:35 ?-------------------------------------------------------------------------------- ?Physical Exam Details ?Patient Name: Date of Service: ?[name], [name] 02/27/2022 8:15 A M ?Medical Record Number: [id number] ?Patient Account Number: [id number] ?Date of Birth/Sex: Treating RN: ?15-Apr-1956 (66 y.o. M) Rolin Barry, Bobbi ?Primary Care Provider: Abelino Derrick Other Clinician: ?Referring Provider: ?Treating Provider/Extender: Worthy Keeler ?Abelino Derrick ?Weeks in Treatment: 7 ?Constitutional ?Well-nourished and well-hydrated in no acute distress. ?Respiratory ?normal breathing without difficulty. ?Psychiatric ?this patient is able to make decisions and demonstrates good insight into disease process. Alert and Oriented x 3. pleasant and cooperative. ?Notes ?Upon inspection patient's wound bed showed evidence of good granulation and epithelization at this point. Fortunately I do not see any evidence of  active ?infection locally or systemically which is great news and overall I am very pleased with where we stand today. ?Electronic Signature(s) ?Signed: 02/27/2022 10:21:05 AM By: Worthy Keeler PA-C ?Entered By: Worthy Keeler on 02/27/2022 10:21:05 ?-------------------------------------------------------------------------------- ?Physician Orders Details ?Patient Name: Date of Service: ?SEBEK, [name] 02/27/2022 8:15 A M ?Medical Record Number: [id number] ?Patient Account Number: [id number] ?Date of Birth/Sex: Treating RN: ?[birth year] (66 y.o. 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 Margarita Grizzle and Bobbi Room 8 03/06/2022 0815 Wednesday ?Return A ?Abigail Butts Room 8 03/13/2022 0815 Wednesday ?Other: - Bring in compression stockings next week. ?***leg measurements and purchase stockings this week.*** ?Edema Control - Lymphedema / SCD / Other ?Elevate legs to the level of the heart or above for 30 minutes daily and/or when sitting, a frequency of: - 3-4 times a day throughout the day. ?Avoid standing for long periods of time. ?Exercise regularly ?Moisturize legs daily. - left leg every night before bed. ?Home Health ?Discontinue home health for wound care. ?Other Home Health Orders/Instructions: - [facility] ?Wound Treatment ?Wound #1 - Lower Leg Wound Laterality: Right, Posterior ?Cleanser: Soap and  Water 1 x Per Week/30 Days ?Discharge Instructions: May shower  and wash wound with dial antibacterial soap and water prior to dressing change. ?Cleanser: Wound Cleanser 1 x Per Week/30 Days ?Discharge Instructions

## 2022-03-01 DIAGNOSIS — M1712 Unilateral primary osteoarthritis, left knee: Secondary | ICD-10-CM | POA: Diagnosis not present

## 2022-03-01 DIAGNOSIS — E785 Hyperlipidemia, unspecified: Secondary | ICD-10-CM | POA: Diagnosis not present

## 2022-03-01 DIAGNOSIS — Z86718 Personal history of other venous thrombosis and embolism: Secondary | ICD-10-CM | POA: Diagnosis not present

## 2022-03-01 DIAGNOSIS — E1122 Type 2 diabetes mellitus with diabetic chronic kidney disease: Secondary | ICD-10-CM | POA: Diagnosis not present

## 2022-03-01 DIAGNOSIS — Z7985 Long-term (current) use of injectable non-insulin antidiabetic drugs: Secondary | ICD-10-CM | POA: Diagnosis not present

## 2022-03-01 DIAGNOSIS — I129 Hypertensive chronic kidney disease with stage 1 through stage 4 chronic kidney disease, or unspecified chronic kidney disease: Secondary | ICD-10-CM | POA: Diagnosis not present

## 2022-03-01 DIAGNOSIS — G4730 Sleep apnea, unspecified: Secondary | ICD-10-CM | POA: Diagnosis not present

## 2022-03-01 DIAGNOSIS — Z7901 Long term (current) use of anticoagulants: Secondary | ICD-10-CM | POA: Diagnosis not present

## 2022-03-01 DIAGNOSIS — Z794 Long term (current) use of insulin: Secondary | ICD-10-CM | POA: Diagnosis not present

## 2022-03-01 DIAGNOSIS — M545 Low back pain, unspecified: Secondary | ICD-10-CM | POA: Diagnosis not present

## 2022-03-01 DIAGNOSIS — N1832 Chronic kidney disease, stage 3b: Secondary | ICD-10-CM | POA: Diagnosis not present

## 2022-03-01 DIAGNOSIS — L03115 Cellulitis of right lower limb: Secondary | ICD-10-CM | POA: Diagnosis not present

## 2022-03-01 DIAGNOSIS — Z86711 Personal history of pulmonary embolism: Secondary | ICD-10-CM | POA: Diagnosis not present

## 2022-03-01 DIAGNOSIS — N179 Acute kidney failure, unspecified: Secondary | ICD-10-CM | POA: Diagnosis not present

## 2022-03-03 ENCOUNTER — Encounter: Payer: Self-pay | Admitting: Family Medicine

## 2022-03-03 ENCOUNTER — Encounter: Payer: Self-pay | Admitting: Internal Medicine

## 2022-03-04 ENCOUNTER — Telehealth: Payer: Self-pay | Admitting: Family Medicine

## 2022-03-04 ENCOUNTER — Other Ambulatory Visit: Payer: Self-pay

## 2022-03-04 DIAGNOSIS — I129 Hypertensive chronic kidney disease with stage 1 through stage 4 chronic kidney disease, or unspecified chronic kidney disease: Secondary | ICD-10-CM | POA: Diagnosis not present

## 2022-03-04 DIAGNOSIS — Z86711 Personal history of pulmonary embolism: Secondary | ICD-10-CM | POA: Diagnosis not present

## 2022-03-04 DIAGNOSIS — N1832 Chronic kidney disease, stage 3b: Secondary | ICD-10-CM | POA: Diagnosis not present

## 2022-03-04 DIAGNOSIS — M1712 Unilateral primary osteoarthritis, left knee: Secondary | ICD-10-CM | POA: Diagnosis not present

## 2022-03-04 DIAGNOSIS — M545 Low back pain, unspecified: Secondary | ICD-10-CM | POA: Diagnosis not present

## 2022-03-04 DIAGNOSIS — G4730 Sleep apnea, unspecified: Secondary | ICD-10-CM | POA: Diagnosis not present

## 2022-03-04 DIAGNOSIS — Z7901 Long term (current) use of anticoagulants: Secondary | ICD-10-CM | POA: Diagnosis not present

## 2022-03-04 DIAGNOSIS — N179 Acute kidney failure, unspecified: Secondary | ICD-10-CM | POA: Diagnosis not present

## 2022-03-04 DIAGNOSIS — Z794 Long term (current) use of insulin: Secondary | ICD-10-CM | POA: Diagnosis not present

## 2022-03-04 DIAGNOSIS — E1122 Type 2 diabetes mellitus with diabetic chronic kidney disease: Secondary | ICD-10-CM | POA: Diagnosis not present

## 2022-03-04 DIAGNOSIS — Z7985 Long-term (current) use of injectable non-insulin antidiabetic drugs: Secondary | ICD-10-CM | POA: Diagnosis not present

## 2022-03-04 DIAGNOSIS — E785 Hyperlipidemia, unspecified: Secondary | ICD-10-CM | POA: Diagnosis not present

## 2022-03-04 DIAGNOSIS — L03115 Cellulitis of right lower limb: Secondary | ICD-10-CM | POA: Diagnosis not present

## 2022-03-04 DIAGNOSIS — Z86718 Personal history of other venous thrombosis and embolism: Secondary | ICD-10-CM | POA: Diagnosis not present

## 2022-03-04 MED ORDER — HUMULIN 70/30 (70-30) 100 UNIT/ML ~~LOC~~ SUSP: 35.0000 [IU] | Freq: Two times a day (BID) | SUBCUTANEOUS | 3 refills | Status: DC

## 2022-03-04 NOTE — Telephone Encounter (Signed)
HH FORMS received via fax Type of Form: _x__ CERTIFICATION       ___ RECERTIFICATION GREEN charge sheet attached and placed in provider folder at front desk. ~~~ route to CMA/provider Team  

## 2022-03-05 ENCOUNTER — Telehealth: Payer: Self-pay

## 2022-03-05 ENCOUNTER — Encounter: Payer: Self-pay | Admitting: Internal Medicine

## 2022-03-05 ENCOUNTER — Ambulatory Visit (INDEPENDENT_AMBULATORY_CARE_PROVIDER_SITE_OTHER): Payer: Medicare Other | Admitting: Internal Medicine

## 2022-03-05 VITALS — BP 130/80 | HR 63 | Ht 73.0 in | Wt 233.2 lb

## 2022-03-05 DIAGNOSIS — R739 Hyperglycemia, unspecified: Secondary | ICD-10-CM

## 2022-03-05 DIAGNOSIS — N1832 Chronic kidney disease, stage 3b: Secondary | ICD-10-CM | POA: Diagnosis not present

## 2022-03-05 DIAGNOSIS — E1165 Type 2 diabetes mellitus with hyperglycemia: Secondary | ICD-10-CM

## 2022-03-05 DIAGNOSIS — Z794 Long term (current) use of insulin: Secondary | ICD-10-CM

## 2022-03-05 DIAGNOSIS — E1122 Type 2 diabetes mellitus with diabetic chronic kidney disease: Secondary | ICD-10-CM

## 2022-03-05 DIAGNOSIS — E1142 Type 2 diabetes mellitus with diabetic polyneuropathy: Secondary | ICD-10-CM | POA: Diagnosis not present

## 2022-03-05 LAB — POCT GLYCOSYLATED HEMOGLOBIN (HGB A1C): Hemoglobin A1C: 6.5 % — AB (ref 4.0–5.6)

## 2022-03-05 MED ORDER — INSULIN LISPRO (1 UNIT DIAL) 100 UNIT/ML (KWIKPEN): PEN_INJECTOR | SUBCUTANEOUS | 4 refills | Status: DC

## 2022-03-05 MED ORDER — "INSULIN SYRINGES 31G X 5/16"" 0.5 ML MISC": 1.0000 | Freq: Every day | 3 refills | Status: AC

## 2022-03-05 MED ORDER — DEXCOM G6 TRANSMITTER MISC: 1.0000 | 3 refills | Status: AC

## 2022-03-05 MED ORDER — DEXCOM G6 SENSOR MISC: 1.0000 | 3 refills | Status: AC

## 2022-03-05 MED ORDER — INSULIN GLARGINE 100 UNIT/ML ~~LOC~~ SOLN: 35.0000 [IU] | Freq: Every day | SUBCUTANEOUS | 3 refills | Status: DC

## 2022-03-05 MED ORDER — INSULIN PEN NEEDLE 32G X 4 MM MISC: 1.0000 | Freq: Three times a day (TID) | 3 refills | Status: AC

## 2022-03-05 NOTE — Patient Instructions (Addendum)
-   Stop Humulin MIx  ?- Continue Trulicity 1.5 mg weekly  ?- Start Lantus (Vials) 35 units ONCE daily  ?- Start Humalog 10 units with each meal PLUS scale below if needed  ?-Humalog correctional insulin: ADD extra units on insulin to your meal-time Humalog dose if your blood sugars are higher than 155. Use the scale below to help guide you:  ? ?Blood sugar before meal Number of units to inject  ?Less than 155 0 unit  ?156 -  180 1 units  ?181 -  205 2 units  ?206 -  230 3 units  ?231 -  255 4 units  ?256 -  280 5 units  ?281 -  305 6 units  ?306 -  330 7 units  ?331 -  355 8 units  ?356 - 380 9 units   ? ?HOW TO TREAT LOW BLOOD SUGARS (Blood sugar LESS THAN 70 MG/DL) ?Please follow the RULE OF 15 for the treatment of hypoglycemia treatment (when your (blood sugars are less than 70 mg/dL)  ? ?STEP 1: Take 15 grams of carbohydrates when your blood sugar is low, which includes:  ?3-4 GLUCOSE TABS  OR ?3-4 OZ OF JUICE OR REGULAR SODA OR ?ONE TUBE OF GLUCOSE GEL   ? ?STEP 2: RECHECK blood sugar in 15 MINUTES ?STEP 3: If your blood sugar is still low at the 15 minute recheck --> then, go back to STEP 1 and treat AGAIN with another 15 grams of carbohydrates. ? ?

## 2022-03-05 NOTE — Telephone Encounter (Signed)
Dexcom order sent through [facility] ? ?

## 2022-03-05 NOTE — Progress Notes (Signed)
?Name: [name]  ?Age/ Sex: 66 y.o., male   ?MRN/ DOB: [id number], 1956-03-15    ? ?PCP: [name], MD   ?Reason for Endocrinology Evaluation: Type 2 Diabetes Mellitus  ?Initial Endocrine Consultative Visit: 02/23/2020  ? ? ?PATIENT IDENTIFIER: [name] is a 66 y.o. male with a past medical history of HTN, T2DM and Hx of PE. The patient has followed with Endocrinology clinic since 02/23/2020 for consultative assistance with management of his diabetes. ? ?DIABETIC HISTORY:  ?[name] was diagnosed with T2DM in 2009, metformin- stopped 12/2019 due to AKI as well as Glyburide. Has been on insulin mix . His hemoglobin A1c has ranged from 7.6% ,peaking at 8.6% in 2021 ? ? ?On her initial visit [facility] her A1c was 8.6% , she was on Humulin MIx which we adjusted ? ? ? ?Moved from [place]  ~ early 2021 ? ? ?GLP-1 agonists started 04/2021 ? ?SUBJECTIVE:  ? ?During the last visit (08/29/2021): A1c 6.0 %. Reduced insulin mix and continued  trulicity  ? ? ?Today (03/05/2022): [name] is here for a follow up on diabetes management.  He checks his blood sugars 3 times daily. The patient has had hypoglycemic episodes since the last clinic visit. ? ?Has sporadic diarrhea  ? ?He was recently seen by the wound clinic for right leg cellulitis , treated with Abx, has been off for a month  ? ? ? ? ?HOME DIABETES REGIMEN:  ?Humulin Mix 35 units with Breakfast and 35 units with Supper ?Trulicity 1.5 mg weekly  ? ? ?Statin: yes ?ACE-I/ARB: Intolerant to Lisinopril to AKI ? ? ? ?METER DOWNLOAD SUMMARY:  Unable to download  ?64 -  311 ? ? ? ? ?DIABETIC COMPLICATIONS: ?Microvascular complications:  ?CKD, neuropathy ?Denies: retinopathy ?Last eye exam: Completed 05/16/2021 ?  ?Macrovascular complications:  ? ?Denies: CAD, PVD, CVA ?  ?  ? ?HISTORY:  ?Past Medical History:  ?Past Medical History:  ?Diagnosis Date  ? Arthritis   ? Chronic kidney disease   ? reduced kidney function. Stage III  ? Diabetes mellitus without  complication ([place])   ? DVT (deep venous thrombosis) ([place])   ? age indeterminate LLE DVT 02/09/20  ? Dyspnea   ? On exertion  ? History of kidney stones   ? History of pulmonary embolus (PE)   ? Hypertension   ? Lipidemia   ? Prostate enlargement   ? Pulmonary embolism ([place]) 2009  ? Sleep apnea   ? ?Past Surgical History:  ?Past Surgical History:  ?Procedure Laterality Date  ? CYSTOSCOPY W/ URETERAL STENT PLACEMENT Left 10/08/2020  ? Procedure: CYSTOSCOPY WITH RETROGRADE PYELOGRAM/URETERAL STENT PLACEMENT;  Surgeon: Irine Seal, MD;  Location: [facility];  Service: Urology;  Laterality: Left;  ? CYSTOSCOPY/URETEROSCOPY/HOLMIUM LASER/STENT PLACEMENT Right 06/07/2020  ? Procedure: CYSTOSCOPY/URETEROSCOPY/HOLMIUM LASER/STENT PLACEMENT;  Surgeon: Lucas Mallow, MD;  Location: [facility];  Service: Urology;  Laterality: Right;  ? CYSTOSCOPY/URETEROSCOPY/HOLMIUM LASER/STENT PLACEMENT Left 10/19/2020  ? Procedure: CYSTOSCOPY LEFT URETEROSCOPY/HOLMIUM LASER/STENT EXCHANGE;  Surgeon: Irine Seal, MD;  Location: [facility];  Service: Urology;  Laterality: Left;  ? TOTAL KNEE ARTHROPLASTY Left 08/06/2021  ? Procedure: LEFT TOTAL KNEE ARTHROPLASTY;  Surgeon: Leandrew Koyanagi, MD;  Location: [facility];  Service: Orthopedics;  Laterality: Left;  ? ?Social History:  reports that he has never smoked. He has never used smokeless tobacco. He reports current alcohol use of about 1.0 standard drink per week. He reports that he does not use drugs. ?  Family History:  ?Family History  ?Problem Relation Age of Onset  ? Cancer Mother   ? Heart disease Father   ? ? ? ?HOME MEDICATIONS: ?Allergies as of 03/05/2022   ?No Known Allergies ?  ? ?  ?Medication List  ?  ? ?  ? Accurate as of Mar 05, 2022  3:42 PM. If you have any questions, ask your nurse or doctor.  ?  ?  ? ?  ? ?STOP taking these medications   ? ?BD Insulin Syringe U/F 31G X 5/16" 1 ML Misc ?Generic drug: Insulin Syringe-Needle U-100 ?Replaced by: Insulin Syringes 31G X 5/16" 0.5 ML Misc ?Stopped by:  Dorita Sciara, MD ?  ?HumuLIN 70/30 (70-30) 100 UNIT/ML injection ?Generic drug: insulin NPH-regular Human ?Stopped by: Dorita Sciara, MD ?  ? ?  ? ?TAKE these medications   ? ?Accu-Chek Aviva device ?by Other route. Use as instructed ?  ?Accu-Chek Guide test strip ?Generic drug: glucose blood ?USE TO CHECK BLOOD SUGAR 3 TIMES DAILY.E11.65 ?  ?Accu-Chek Softclix Lancets lancets ?Use as instructed to check blood sugar 3 times daily Dx is E11.65 ?  ?cefadroxil 500 MG capsule ?Commonly known as: DURICEF ?Take one daily ?  ?Dexcom G6 Sensor Misc ?1 Device by Does not apply route as directed. ?Started by: Dorita Sciara, MD ?  ?Dexcom G6 Transmitter Misc ?1 Device by Does not apply route as directed. ?Started by: Dorita Sciara, MD ?  ?diclofenac Sodium 1 % Gel ?Commonly known as: VOLTAREN ?Apply 1 application topically 4 (four) times daily as needed (pain). ?  ?furosemide 20 MG tablet ?Commonly known as: LASIX ?Take 1 tablet (20 mg total) by mouth daily. ?  ?gabapentin 300 MG capsule ?Commonly known as: NEURONTIN ?Take 2 capsules (600 mg total) by mouth 2 (two) times daily. ?  ?insulin glargine 100 UNIT/ML injection ?Commonly known as: Lantus ?Inject 0.35 mLs (35 Units total) into the skin daily. ?Started by: Dorita Sciara, MD ?  ?insulin lispro 100 UNIT/ML KwikPen ?Commonly known as: HumaLOG KwikPen ?Max daily 50 units ?Started by: Dorita Sciara, MD ?  ?Insulin Pen Needle 32G X 4 MM Misc ?1 Device by Does not apply route 3 (three) times daily. ?What changed: when to take this ?Changed by: Dorita Sciara, MD ?  ?Insulin Syringes (Disposable) U-100 1 ML Misc ?1 Device by Does not apply route as directed. ?  ?Insulin Syringes 31G X 5/16" 0.5 ML Misc ?1 Device by Does not apply route daily in the afternoon. ?Replaces: BD Insulin Syringe U/F 31G X 5/16" 1 ML Misc ?Started by: Dorita Sciara, MD ?  ?losartan 50 MG tablet ?Commonly known as: COZAAR ?Take 50 mg by mouth  daily. ?  ?lovastatin 40 MG tablet ?Commonly known as: MEVACOR ?Take 1 tablet (40 mg total) by mouth at bedtime. ?  ?methocarbamol 500 MG tablet ?Commonly known as: Robaxin ?Take 1 tablet (500 mg total) by mouth 2 (two) times daily as needed. To be taken after surgery ?  ?montelukast 10 MG tablet ?Commonly known as: SINGULAIR ?Take 1 tablet (10 mg total) by mouth at bedtime. ?  ?sodium bicarbonate 650 MG tablet ?Take 1 tablet (650 mg total) by mouth 2 (two) times daily. ?  ?Trulicity 1.5 GB/1.5VV Sopn ?Generic drug: Dulaglutide ?Inject 1.5 mg into the skin once a week. ?What changed: additional instructions ?  ?warfarin 4 MG tablet ?Commonly known as: COUMADIN ?Take as directed by the anticoagulation clinic. If you are unsure how  to take this medication, talk to your nurse or doctor. ?Original instructions: TAKE 1 TABLET BY MOUTH EVERY DAY ?  ? ?  ? ? ? ?OBJECTIVE:  ? ?Vital Signs: BP 130/80 (BP Location: Left Arm, Patient Position: Sitting, Cuff Size: Large)   Pulse 63   Ht '6\' 1"'$  (1.854 m)   Wt 233 lb 3.2 oz (105.8 kg)   SpO2 99%   BMI 30.77 kg/m?   ?Wt Readings from Last 3 Encounters:  ?03/05/22 233 lb 3.2 oz (105.8 kg)  ?01/24/22 233 lb 6.4 oz (105.9 kg)  ?01/11/22 230 lb 12.8 oz (104.7 kg)  ? ? ? ?Exam: ?General: Pt appears well and is in NAD  ?Lungs: Clear with good BS bilat with no rales, rhonchi, or wheezes  ?Heart: RRR   ?Extremities: No pretibial edema on the left  ?Neuro: MS is good with appropriate affect, pt is alert and Ox3  ? ? ? ?DM foot exam: 45/12/2021- unable to perform patient has Ace wrap and around the right leg due to cellulitis ?  ? ? ?DATA REVIEWED: ? ?Lab Results  ?Component Value Date  ? HGBA1C 6.5 (A) 03/05/2022  ? HGBA1C 6.4 (H) 12/06/2021  ? HGBA1C 6.0 (A) 08/29/2021  ? ?Lab Results  ?Component Value Date  ? MICROALBUR 4.9 (H) 08/28/2020  ? [place] 55 01/01/2022  ? CREATININE 1.84 (H) 12/19/2021  ? ?Lab Results  ?Component Value Date  ? MICRALBCREAT 4.4 08/28/2020  ? ? ? ?Lab Results   ?Component Value Date  ? CHOL 112 01/01/2022  ? HDL 34.50 (L) 01/01/2022  ? [place] 55 01/01/2022  ? LDLDIRECT 52.0 01/01/2022  ? TRIG 114.0 01/01/2022  ? CHOLHDL 3 01/01/2022  ?     ? ? ?ASSESSMENT / PLAN / RECOMME

## 2022-03-06 ENCOUNTER — Encounter (HOSPITAL_BASED_OUTPATIENT_CLINIC_OR_DEPARTMENT_OTHER): Payer: Medicare Other | Attending: Internal Medicine | Admitting: Physician Assistant

## 2022-03-06 DIAGNOSIS — L97212 Non-pressure chronic ulcer of right calf with fat layer exposed: Secondary | ICD-10-CM | POA: Diagnosis not present

## 2022-03-06 DIAGNOSIS — E11622 Type 2 diabetes mellitus with other skin ulcer: Secondary | ICD-10-CM | POA: Diagnosis not present

## 2022-03-06 DIAGNOSIS — E11621 Type 2 diabetes mellitus with foot ulcer: Secondary | ICD-10-CM | POA: Diagnosis not present

## 2022-03-06 DIAGNOSIS — L97812 Non-pressure chronic ulcer of other part of right lower leg with fat layer exposed: Secondary | ICD-10-CM | POA: Diagnosis not present

## 2022-03-06 DIAGNOSIS — Z86718 Personal history of other venous thrombosis and embolism: Secondary | ICD-10-CM | POA: Insufficient documentation

## 2022-03-06 DIAGNOSIS — I872 Venous insufficiency (chronic) (peripheral): Secondary | ICD-10-CM | POA: Diagnosis not present

## 2022-03-06 DIAGNOSIS — I890 Lymphedema, not elsewhere classified: Secondary | ICD-10-CM | POA: Insufficient documentation

## 2022-03-06 DIAGNOSIS — Z7901 Long term (current) use of anticoagulants: Secondary | ICD-10-CM | POA: Insufficient documentation

## 2022-03-06 NOTE — Progress Notes (Addendum)
[place], [place] ([id number]) ?Visit Report for 03/06/2022 ?Chief Complaint Document Details ?Patient Name: Date of Service: ?[name], [name] 03/06/2022 8:15 A M ?Medical Record Number: [id number] ?Patient Account Number: [id number] ?Date of Birth/Sex: Treating RN: ?11-07-1955 (66 y.o. M) Rolin Barry, Bobbi ?Primary Care Provider: Abelino Derrick Other Clinician: ?Referring Provider: ?Treating Provider/Extender: Worthy Keeler ?Abelino Derrick ?Weeks in Treatment: 8 ?Information Obtained from: Patient ?Chief Complaint ?Right LE Ulcer ?Electronic Signature(s) ?Signed: 03/06/2022 8:40:15 AM By: Worthy Keeler PA-C ?Entered By: Worthy Keeler on 03/06/2022 08:40:15 ?-------------------------------------------------------------------------------- ?HPI Details ?Patient Name: Date of Service: ?[name], [name] 03/06/2022 8:15 A M ?Medical Record Number: [id number] ?Patient Account Number: [id number] ?Date of Birth/Sex: Treating RN: ?Sep 18, 1956 (66 y.o. M) Rolin Barry, Bobbi ?Primary Care Provider: Abelino Derrick Other Clinician: ?Referring Provider: ?Treating Provider/Extender: Worthy Keeler ?Abelino Derrick ?Weeks in Treatment: 8 ?History of Present Illness ?HPI Description: 01/09/2022 upon evaluation today patient appears to be doing well with regard to his leg compared to where things have been. I did review his ?note and it appears he had significant cellulitis. He was actually in the hospital due to sepsis from February 19 February 13. The good news is he recovered ?from that he was placed on Duricef for a time and then this was after the Keflex. The Keflex was really not effective. Eventually he was transitioned to ?Bactrim which is what he has been taking currently he tells me when they switch to that is when things really got better. That was due to a culture which showed ?Enterobacter as the main organism with Klebsiella being a secondary. Nonetheless in the end I do feel like that the Bactrim has done a good job for him based ?on what he is  telling me and what I am seeing. He did have a CT scan which was on 12/07/2021 which showed cellulitis fortunately nothing deeper structure was ?infected and no abscess. He also had a DVT study on 12/07/2020 which was negative. On 2/24 is when the culture was performed. His most recent hemoglobin ?A1c was 6.4 according to his primary care provider and the patient currently is on the Bactrim for a couple more days along with an Unna boot and alginate ?which appears to been the order although what he had was Curlex and Coban on when he came in today with alginate. 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 ?01/23/2022 upon evaluation today patient appears to be doing excellent in regard to his leg ulcer. This is significantly improved compared to his first visit. ?Overall both he and I are both extremely pleased with where things stand today his pain is better the infection seems to be cleared and overall I think you are ?on the right path. ?01/30/2022 upon evaluation today patient appears to be doing well currently in regard to his leg ulcers. He has been tolerating the dressing changes without ?complication. Fortunately I do not see any evidence of active infection locally or systemically which is great news. No fevers, chills, nausea, vomiting, or ?diarrhea. ?02-06-2022 on evaluation today patient's wound is actually showing signs of significant improvement. We are getting very close to resolution on I am extremely ?pleased with where we stand today. I do not see any signs of active infection locally or systemically at this time which is great news. ?02-13-2022 upon evaluation today patient appears to be doing well with regard to his wounds. He has been tolerating  the dressing changes without complication ?overall very pleased with where we stand today. ?02-20-2022 upon evaluation today patient  appears to be doing excellent in regard to his leg. In fact this is significantly smaller and overall I feel like we are doing ?quite well. He is very close to complete resolution. With that being said I do believe we still have a little ways to go to get this completely closed. I think within ?the next 1-2 weeks would likely get a be done. Home health again only wrapped him from the ankle up not including the foot. Honestly I think at this point ?home health is probably not necessary and we can just discontinue that altogether. ?02-27-2022 upon evaluation today patient appears to be doing well with regard to his leg ulcer. He has been Tolerating the dressing changes without complication. ?Fortunately I do not see any evidence of infection locally or systemically which is great news overall very pleased with where we stand at this point. No ?fevers, chills, nausea, vomiting, or diarrhea. ?03-06-2022 upon evaluation today patient appears to be doing well with regard to his wound in fact this appears to be pretty much completely healed based on ?what I am seeing though he probably needs to be wrap 1 more week. ?Electronic Signature(s) ?Signed: 03/06/2022 8:43:24 AM By: Worthy Keeler PA-C ?Entered By: Worthy Keeler on 03/06/2022 08:43:24 ?-------------------------------------------------------------------------------- ?Physical Exam Details ?Patient Name: Date of Service: ?[name], [name] 03/06/2022 8:15 A M ?Medical Record Number: [id number] ?Patient Account Number: [id number] ?Date of Birth/Sex: Treating RN: ?10/24/1956 (66 y.o. M) Rolin Barry, Bobbi ?Primary Care Provider: Abelino Derrick Other Clinician: ?Referring Provider: ?Treating Provider/Extender: Worthy Keeler ?Abelino Derrick ?Weeks in Treatment: 8 ?Constitutional ?Well-nourished and well-hydrated in no acute distress. ?Respiratory ?normal breathing without difficulty. ?Psychiatric ?this patient is able to make decisions and demonstrates good insight into disease  process. Alert and Oriented x 3. pleasant and cooperative. ?Notes ?Upon inspection patient's wound bed actually showed signs of good granulation and epithelization at this point. Fortunately I do not see any evidence of active ?infection locally or systemically which is great news and overall very pleased with where we stand today. ?Electronic Signature(s) ?Signed: 03/06/2022 8:43:40 AM By: Worthy Keeler PA-C ?Entered By: Worthy Keeler on 03/06/2022 08:43:40 ?-------------------------------------------------------------------------------- ?Physician Orders Details ?Patient Name: Date of Service: ?[name], [name] 03/06/2022 8:15 A M ?Medical Record Number: [id number] ?Patient Account Number: [id number] ?Date of Birth/Sex: Treating RN: ?1955-11-10 (66 y.o. Mare Ferrari ?Primary Care Provider: Abelino Derrick Other Clinician: ?Referring Provider: ?Treating Provider/Extender: Worthy Keeler ?Abelino Derrick ?Weeks in Treatment: 8 ?Verbal / Phone Orders: No ?Diagnosis Coding ?ICD-10 Coding ?Code Description ?I87.2 Venous insufficiency (chronic) (peripheral) ?I89.0 Lymphedema, not elsewhere classified ?E11.622 Type 2 diabetes mellitus with other skin ulcer ?Q98.264 Non-pressure chronic ulcer of other part of right lower leg with fat layer exposed ?Z79.01 Long term (current) use of anticoagulants ?B58.309 Personal history of other venous thrombosis and embolism ?Follow-up Appointments ?ppointment in 1 week. Margarita Grizzle and Bobbi Room 8 03/13/2022 0815 Wednesday ?Return A ?Other: - Bring in compression stockings next week. ?***leg measurements and purchase stockings this week.*** ?Edema Control - Lymphedema / SCD / Other ?Elevate legs to the level of the heart or above for 30 minutes daily and/or when sitting, a frequency of: - 3-4 times a day throughout the day. ?Avoid standing for long periods of time. ?Exercise regularly ?Moisturize legs daily. - left leg every night before bed. ?Home Health ?Discontinue home health for wound  care. ?  Other Home Health Orders/Instructions: - [facility] home [facility] ?Wound Treatment ?Wound #1 - Lower Leg Wound Laterality: Right, Posterior ?Cleanser: Soap and Water 1 x Per Week/30 Days ?Discharge Instruc

## 2022-03-08 DIAGNOSIS — I129 Hypertensive chronic kidney disease with stage 1 through stage 4 chronic kidney disease, or unspecified chronic kidney disease: Secondary | ICD-10-CM | POA: Diagnosis not present

## 2022-03-08 DIAGNOSIS — N1832 Chronic kidney disease, stage 3b: Secondary | ICD-10-CM | POA: Diagnosis not present

## 2022-03-08 DIAGNOSIS — Z86711 Personal history of pulmonary embolism: Secondary | ICD-10-CM | POA: Diagnosis not present

## 2022-03-08 DIAGNOSIS — E785 Hyperlipidemia, unspecified: Secondary | ICD-10-CM | POA: Diagnosis not present

## 2022-03-08 DIAGNOSIS — E1122 Type 2 diabetes mellitus with diabetic chronic kidney disease: Secondary | ICD-10-CM | POA: Diagnosis not present

## 2022-03-08 DIAGNOSIS — M545 Low back pain, unspecified: Secondary | ICD-10-CM | POA: Diagnosis not present

## 2022-03-08 DIAGNOSIS — N179 Acute kidney failure, unspecified: Secondary | ICD-10-CM | POA: Diagnosis not present

## 2022-03-08 DIAGNOSIS — G4730 Sleep apnea, unspecified: Secondary | ICD-10-CM | POA: Diagnosis not present

## 2022-03-08 DIAGNOSIS — M1712 Unilateral primary osteoarthritis, left knee: Secondary | ICD-10-CM | POA: Diagnosis not present

## 2022-03-08 DIAGNOSIS — Z794 Long term (current) use of insulin: Secondary | ICD-10-CM | POA: Diagnosis not present

## 2022-03-08 DIAGNOSIS — Z7901 Long term (current) use of anticoagulants: Secondary | ICD-10-CM | POA: Diagnosis not present

## 2022-03-08 DIAGNOSIS — L03115 Cellulitis of right lower limb: Secondary | ICD-10-CM | POA: Diagnosis not present

## 2022-03-08 DIAGNOSIS — Z7985 Long-term (current) use of injectable non-insulin antidiabetic drugs: Secondary | ICD-10-CM | POA: Diagnosis not present

## 2022-03-08 DIAGNOSIS — Z86718 Personal history of other venous thrombosis and embolism: Secondary | ICD-10-CM | POA: Diagnosis not present

## 2022-03-12 ENCOUNTER — Inpatient Hospital Stay (HOSPITAL_COMMUNITY)
Admission: EM | Admit: 2022-03-12 | Discharge: 2022-03-14 | DRG: 683 | Disposition: A | Discharge status: Home / Self Care | Payer: Medicare Other | Attending: Family Medicine | Admitting: Family Medicine

## 2022-03-12 ENCOUNTER — Encounter (HOSPITAL_COMMUNITY): Payer: Self-pay

## 2022-03-12 ENCOUNTER — Other Ambulatory Visit: Payer: Self-pay

## 2022-03-12 DIAGNOSIS — Z7901 Long term (current) use of anticoagulants: Secondary | ICD-10-CM | POA: Diagnosis not present

## 2022-03-12 DIAGNOSIS — G4730 Sleep apnea, unspecified: Secondary | ICD-10-CM | POA: Diagnosis not present

## 2022-03-12 DIAGNOSIS — I10 Essential (primary) hypertension: Secondary | ICD-10-CM | POA: Diagnosis present

## 2022-03-12 DIAGNOSIS — T50995A Adverse effect of other drugs, medicaments and biological substances, initial encounter: Secondary | ICD-10-CM | POA: Diagnosis not present

## 2022-03-12 DIAGNOSIS — R197 Diarrhea, unspecified: Principal | ICD-10-CM | POA: Diagnosis present

## 2022-03-12 DIAGNOSIS — E872 Acidosis, unspecified: Secondary | ICD-10-CM | POA: Diagnosis not present

## 2022-03-12 DIAGNOSIS — E7800 Pure hypercholesterolemia, unspecified: Secondary | ICD-10-CM | POA: Diagnosis not present

## 2022-03-12 DIAGNOSIS — K521 Toxic gastroenteritis and colitis: Secondary | ICD-10-CM | POA: Diagnosis present

## 2022-03-12 DIAGNOSIS — G4733 Obstructive sleep apnea (adult) (pediatric): Secondary | ICD-10-CM | POA: Diagnosis not present

## 2022-03-12 DIAGNOSIS — Z743 Need for continuous supervision: Secondary | ICD-10-CM | POA: Diagnosis not present

## 2022-03-12 DIAGNOSIS — Z86718 Personal history of other venous thrombosis and embolism: Secondary | ICD-10-CM | POA: Diagnosis not present

## 2022-03-12 DIAGNOSIS — Z8249 Family history of ischemic heart disease and other diseases of the circulatory system: Secondary | ICD-10-CM | POA: Diagnosis not present

## 2022-03-12 DIAGNOSIS — R110 Nausea: Secondary | ICD-10-CM | POA: Diagnosis not present

## 2022-03-12 DIAGNOSIS — E1122 Type 2 diabetes mellitus with diabetic chronic kidney disease: Secondary | ICD-10-CM | POA: Diagnosis not present

## 2022-03-12 DIAGNOSIS — R5381 Other malaise: Secondary | ICD-10-CM | POA: Diagnosis present

## 2022-03-12 DIAGNOSIS — R531 Weakness: Secondary | ICD-10-CM | POA: Diagnosis not present

## 2022-03-12 DIAGNOSIS — Z809 Family history of malignant neoplasm, unspecified: Secondary | ICD-10-CM | POA: Diagnosis not present

## 2022-03-12 DIAGNOSIS — Y92009 Unspecified place in unspecified non-institutional (private) residence as the place of occurrence of the external cause: Secondary | ICD-10-CM

## 2022-03-12 DIAGNOSIS — Z86711 Personal history of pulmonary embolism: Secondary | ICD-10-CM | POA: Diagnosis not present

## 2022-03-12 DIAGNOSIS — N400 Enlarged prostate without lower urinary tract symptoms: Secondary | ICD-10-CM | POA: Diagnosis present

## 2022-03-12 DIAGNOSIS — N179 Acute kidney failure, unspecified: Secondary | ICD-10-CM | POA: Diagnosis not present

## 2022-03-12 DIAGNOSIS — E1142 Type 2 diabetes mellitus with diabetic polyneuropathy: Secondary | ICD-10-CM | POA: Diagnosis not present

## 2022-03-12 DIAGNOSIS — I129 Hypertensive chronic kidney disease with stage 1 through stage 4 chronic kidney disease, or unspecified chronic kidney disease: Secondary | ICD-10-CM | POA: Diagnosis not present

## 2022-03-12 DIAGNOSIS — D649 Anemia, unspecified: Secondary | ICD-10-CM | POA: Diagnosis not present

## 2022-03-12 DIAGNOSIS — Z79899 Other long term (current) drug therapy: Secondary | ICD-10-CM

## 2022-03-12 DIAGNOSIS — Z96652 Presence of left artificial knee joint: Secondary | ICD-10-CM | POA: Diagnosis not present

## 2022-03-12 DIAGNOSIS — D631 Anemia in chronic kidney disease: Secondary | ICD-10-CM | POA: Diagnosis present

## 2022-03-12 DIAGNOSIS — Z794 Long term (current) use of insulin: Secondary | ICD-10-CM

## 2022-03-12 DIAGNOSIS — Z6830 Body mass index (BMI) 30.0-30.9, adult: Secondary | ICD-10-CM

## 2022-03-12 DIAGNOSIS — N1832 Chronic kidney disease, stage 3b: Secondary | ICD-10-CM | POA: Diagnosis present

## 2022-03-12 DIAGNOSIS — E1151 Type 2 diabetes mellitus with diabetic peripheral angiopathy without gangrene: Secondary | ICD-10-CM | POA: Diagnosis present

## 2022-03-12 DIAGNOSIS — Z87442 Personal history of urinary calculi: Secondary | ICD-10-CM

## 2022-03-12 DIAGNOSIS — E669 Obesity, unspecified: Secondary | ICD-10-CM | POA: Diagnosis present

## 2022-03-12 DIAGNOSIS — Z7985 Long-term (current) use of injectable non-insulin antidiabetic drugs: Secondary | ICD-10-CM

## 2022-03-12 DIAGNOSIS — M1990 Unspecified osteoarthritis, unspecified site: Secondary | ICD-10-CM | POA: Diagnosis not present

## 2022-03-12 DIAGNOSIS — R0902 Hypoxemia: Secondary | ICD-10-CM | POA: Diagnosis not present

## 2022-03-12 DIAGNOSIS — I739 Peripheral vascular disease, unspecified: Secondary | ICD-10-CM | POA: Diagnosis present

## 2022-03-12 LAB — COMPREHENSIVE METABOLIC PANEL
ALT: 11 U/L (ref 0–44)
AST: 8 U/L — ABNORMAL LOW (ref 15–41)
Albumin: 3.6 g/dL (ref 3.5–5.0)
Alkaline Phosphatase: 77 U/L (ref 38–126)
Anion gap: 7 (ref 5–15)
BUN: 51 mg/dL — ABNORMAL HIGH (ref 8–23)
CO2: 14 mmol/L — ABNORMAL LOW (ref 22–32)
Calcium: 7.7 mg/dL — ABNORMAL LOW (ref 8.9–10.3)
Chloride: 116 mmol/L — ABNORMAL HIGH (ref 98–111)
Creatinine, Ser: 2.68 mg/dL — ABNORMAL HIGH (ref 0.61–1.24)
GFR, Estimated: 25 mL/min — ABNORMAL LOW (ref >60)
Glucose, Bld: 190 mg/dL — ABNORMAL HIGH (ref 70–99)
Potassium: 3.7 mmol/L (ref 3.5–5.1)
Sodium: 137 mmol/L (ref 135–145)
Total Bilirubin: 0.4 mg/dL (ref 0.3–1.2)
Total Protein: 7.5 g/dL (ref 6.5–8.1)

## 2022-03-12 LAB — CBC WITH DIFFERENTIAL/PLATELET
Abs Immature Granulocytes: 0.07 10*3/uL (ref 0.00–0.07)
Basophils Absolute: 0 10*3/uL (ref 0.0–0.1)
Basophils Relative: 0 %
Eosinophils Absolute: 0.4 10*3/uL (ref 0.0–0.5)
Eosinophils Relative: 4 %
HCT: 37.9 % — ABNORMAL LOW (ref 39.0–52.0)
Hemoglobin: 12.7 g/dL — ABNORMAL LOW (ref 13.0–17.0)
Immature Granulocytes: 1 %
Lymphocytes Relative: 10 %
Lymphs Abs: 0.9 10*3/uL (ref 0.7–4.0)
MCH: 31.2 pg (ref 26.0–34.0)
MCHC: 33.5 g/dL (ref 30.0–36.0)
MCV: 93.1 fL (ref 80.0–100.0)
Monocytes Absolute: 1 10*3/uL (ref 0.1–1.0)
Monocytes Relative: 10 %
Neutro Abs: 7 10*3/uL (ref 1.7–7.7)
Neutrophils Relative %: 75 %
Platelets: 205 10*3/uL (ref 150–400)
RBC: 4.07 MIL/uL — ABNORMAL LOW (ref 4.22–5.81)
RDW: 15.9 % — ABNORMAL HIGH (ref 11.5–15.5)
WBC: 9.4 10*3/uL (ref 4.0–10.5)
nRBC: 0 % (ref 0.0–0.2)

## 2022-03-12 LAB — BASIC METABOLIC PANEL
Anion gap: 9 (ref 5–15)
BUN: 50 mg/dL — ABNORMAL HIGH (ref 8–23)
CO2: 14 mmol/L — ABNORMAL LOW (ref 22–32)
Calcium: 8.3 mg/dL — ABNORMAL LOW (ref 8.9–10.3)
Chloride: 114 mmol/L — ABNORMAL HIGH (ref 98–111)
Creatinine, Ser: 2.61 mg/dL — ABNORMAL HIGH (ref 0.61–1.24)
GFR, Estimated: 26 mL/min — ABNORMAL LOW (ref >60)
Glucose, Bld: 149 mg/dL — ABNORMAL HIGH (ref 70–99)
Potassium: 3.6 mmol/L (ref 3.5–5.1)
Sodium: 137 mmol/L (ref 135–145)

## 2022-03-12 LAB — GLUCOSE, CAPILLARY
Glucose-Capillary: 141 mg/dL — ABNORMAL HIGH (ref 70–99)
Glucose-Capillary: 210 mg/dL — ABNORMAL HIGH (ref 70–99)

## 2022-03-12 LAB — PROTIME-INR
INR: 2.8 — ABNORMAL HIGH (ref 0.8–1.2)
Prothrombin Time: 29.5 seconds — ABNORMAL HIGH (ref 11.4–15.2)

## 2022-03-12 LAB — C DIFFICILE QUICK SCREEN W PCR REFLEX
C Diff antigen: NEGATIVE
C Diff interpretation: NOT DETECTED
C Diff toxin: NEGATIVE

## 2022-03-12 LAB — CBG MONITORING, ED: Glucose-Capillary: 151 mg/dL — ABNORMAL HIGH (ref 70–99)

## 2022-03-12 MED ORDER — INSULIN GLARGINE-YFGN 100 UNIT/ML ~~LOC~~ SOLN
15.0000 [IU] | Freq: Every day | SUBCUTANEOUS | Status: DC
  Administered 2022-03-12 – 2022-03-14 (×3): 15 [IU] via SUBCUTANEOUS
  Filled 2022-03-12 (×3): qty 0.15

## 2022-03-12 MED ORDER — LACTATED RINGERS IV BOLUS
1000.0000 mL | Freq: Once | INTRAVENOUS | Status: AC
  Administered 2022-03-12: 1000 mL via INTRAVENOUS

## 2022-03-12 MED ORDER — LACTATED RINGERS IV SOLN
INTRAVENOUS | Status: DC
Start: 2022-03-12 — End: 2022-03-14

## 2022-03-12 MED ORDER — ENSURE ENLIVE PO LIQD: 237.0000 mL | Freq: Two times a day (BID) | ORAL | Status: DC

## 2022-03-12 MED ORDER — WARFARIN SODIUM 5 MG PO TABS
5.0000 mg | ORAL_TABLET | Freq: Once | ORAL | Status: AC
  Administered 2022-03-12: 5 mg via ORAL
  Filled 2022-03-12 (×2): qty 1

## 2022-03-12 MED ORDER — ONDANSETRON HCL 4 MG/2ML IJ SOLN: 4.0000 mg | Freq: Four times a day (QID) | INTRAMUSCULAR | Status: DC | PRN

## 2022-03-12 MED ORDER — LOPERAMIDE HCL 2 MG PO CAPS
4.0000 mg | ORAL_CAPSULE | Freq: Once | ORAL | Status: AC
  Administered 2022-03-12: 4 mg via ORAL
  Filled 2022-03-12: qty 2

## 2022-03-12 MED ORDER — ACETAMINOPHEN 325 MG PO TABS: 650.0000 mg | ORAL_TABLET | Freq: Four times a day (QID) | ORAL | Status: DC | PRN

## 2022-03-12 MED ORDER — GABAPENTIN 300 MG PO CAPS
300.0000 mg | ORAL_CAPSULE | Freq: Four times a day (QID) | ORAL | Status: DC
Start: 2022-03-12 — End: 2022-03-14
  Administered 2022-03-12 – 2022-03-14 (×9): 300 mg via ORAL
  Filled 2022-03-12 (×9): qty 1

## 2022-03-12 MED ORDER — ONDANSETRON HCL 4 MG PO TABS: 4.0000 mg | ORAL_TABLET | Freq: Four times a day (QID) | ORAL | Status: DC | PRN

## 2022-03-12 MED ORDER — INSULIN ASPART 100 UNIT/ML IJ SOLN
0.0000 [IU] | Freq: Three times a day (TID) | INTRAMUSCULAR | Status: DC
  Administered 2022-03-12: 1 [IU] via SUBCUTANEOUS
  Administered 2022-03-12 – 2022-03-13 (×2): 2 [IU] via SUBCUTANEOUS
  Filled 2022-03-12: qty 0.09

## 2022-03-12 MED ORDER — WARFARIN - PHARMACIST DOSING INPATIENT: Freq: Every day | Status: DC

## 2022-03-12 MED ORDER — ACETAMINOPHEN 650 MG RE SUPP: 650.0000 mg | Freq: Four times a day (QID) | RECTAL | Status: DC | PRN

## 2022-03-12 NOTE — Progress Notes (Signed)
ANTICOAGULATION CONSULT NOTE - Initial Consult ? ?Pharmacy Consult for warfarin ?Indication:  history of PE ? ?No Known Allergies ? ?Patient Measurements: ?Height: '6\' 1"'$  (185.4 cm) ?Weight: 105.8 kg (233 lb 4 oz) ?IBW/kg (Calculated) : 79.9 ? ?Vital Signs: ?Temp: 97.8 ?F (36.6 ?C) (05/09 0510) ?Temp Source: Oral (05/09 0510) ?BP: 136/78 (05/09 0715) ?Pulse Rate: 90 (05/09 0715) ? ?Labs: ?Recent Labs  ?  03/12/22 ?1610  ?HGB 12.7*  ?HCT 37.9*  ?PLT 205  ?LABPROT 29.5*  ?INR 2.8*  ?CREATININE 2.68*  ? ? ?Estimated Creatinine Clearance: 34.2 mL/min (A) (by C-G formula based on SCr of 2.68 mg/dL (H)). ? ? ?Medical History: ?Past Medical History:  ?Diagnosis Date  ? Arthritis   ? Chronic kidney disease   ? reduced kidney function. Stage III  ? Diabetes mellitus without complication ([place])   ? DVT (deep venous thrombosis) ([place])   ? age indeterminate LLE DVT 02/09/20  ? Dyspnea   ? On exertion  ? History of kidney stones   ? History of pulmonary embolus ([place])   ? Hypertension   ? Lipidemia   ? Prostate enlargement   ? Pulmonary embolism ([employer]) 2009  ? Sleep apnea   ? ? ?Medications: Warfarin PTA ?-Home dose: warfarin 7.5 mg on Thursday & Saturday; 5 mg all other days ? ?Assessment: ?Pt is a 57 yoM admitted with diarrhea. PMH significant for hx of PE for which he is anticoagulated with warfarin. Pharmacy consulted to manage/dose warfarin while inpatient.  ? ?INR on admission: 2.8 (therapeutic) ? ?Today, 03/12/22 ?INR = 2.8 is therapeutic ?CBC: Hgb slightly low; Plt WNL ?SCr 2.68 ?Diet: Full liquid ?No major DDI ? ?Goal of Therapy:  ?INR 2-3 ?Monitor platelets by anticoagulation protocol: Yes ?  ?Plan:  ?Warfarin 5 mg PO today. Continue home dose.  ?Recheck INR and CBC with AM labs tomorrow.  ? ?Lenis Noon, PharmD ?03/12/2022,10:09 AM ?

## 2022-03-12 NOTE — Progress Notes (Signed)
Patient received from ED via wheelchair.  Patient is ambulatory, gait stable.  Alert and oriented x 4, on room air.  Denies pain and nausea at this time.  Oriented to room and unit routine, call bell within reach.  Needs addressed. ?

## 2022-03-12 NOTE — ED Triage Notes (Signed)
BIB [facility] with c/o diarrhea x 5 days, worse over the last 24 hours. Endorses nausea and weakness with possible near-syncopal episode earlier this evening. ?

## 2022-03-12 NOTE — Plan of Care (Signed)
?  Problem: Education: ?Goal: Knowledge of General Education information will improve ?Description: Including pain rating scale, medication(s)/side effects and non-pharmacologic comfort measures ?Outcome: Progressing ?  ?Problem: Health Behavior/Discharge Planning: ?Goal: Ability to manage health-related needs will improve ?Outcome: Progressing ?  ?Problem: Clinical Measurements: ?Goal: Ability to maintain clinical measurements within normal limits will improve ?Outcome: Progressing ?  ?Problem: Clinical Measurements: ?Goal: Will remain free from infection ?Outcome: Progressing ?  ?Problem: Clinical Measurements: ?Goal: Diagnostic test results will improve ?Outcome: Progressing ?  ?Problem: Nutrition: ?Goal: Adequate nutrition will be maintained ?Outcome: Progressing ?  ?Problem: Elimination: ?Goal: Will not experience complications related to bowel motility ?Outcome: Progressing ?  ?Problem: Pain Managment: ?Goal: General experience of comfort will improve ?Outcome: Progressing ?  ?Problem: Safety: ?Goal: Ability to remain free from injury will improve ?Outcome: Progressing ?  ?Problem: Skin Integrity: ?Goal: Risk for impaired skin integrity will decrease ?Outcome: Progressing ?  ?

## 2022-03-12 NOTE — H&P (Signed)
?History and Physical  ? ? ?Patient: [name] AOZ:[id number] DOB: [birth year] ?DOA: 03/12/2022 ?DOS: the patient was seen and examined on 03/12/2022 ?PCP: [name], MD  ?Patient coming from: Home ? ?Chief Complaint:  ?Chief Complaint  ?Patient presents with  ? Diarrhea  ? ?HPI: [name] is a 66 y.o. male with medical history significant of osteoarthritis, stage IIIb CKD, type 2 diabetes, history of DVT, history of pulmonary embolus and on warfarin, urolithiasis, hypertension, hyperlipidemia, prostatomegaly, sleep apnea who is coming to the emergency department complaints of 5 days having 15-20 episodes of nonbloody, no mucus diarrhea associated with generalized weakness, postural dizziness and nausea. No fever, chills, sore throat, but has had rhinorrhea and postnasal drip in recent days.  No abdominal pain at the moment, but has had a few instances of mild/short lasting crampy abdominal pain.  No emesis, constipation, melena or hematochezia.  No flank pain, dysuria, frequency or hematuria.  Chest pain, dyspnea, palpitations, diaphoresis, PND or lower extremity edema.  No polyuria, polydipsia, polyphagia or blurred vision.  No travel history and no sick contacts.  He was treated with IV followed by oral antibiotics in February.  He was discharged recently from wound care. ? ?ED course: Initial vital signs were temperature 97.8 ?F, pulse 86, respiration 22, BP 154/75 mmHg O2 sat 100% on room air.  The patient received LR 2000 mL bolus and Imodium 4 mg p.o. x1.  I added another bolus of 1000 mL of LR. ? ?Lab work: CBC 0 white count 9.4, hemoglobin 12.7 g/dL platelets 205.  PT was 29.5 and INR 2.8.  C. difficile quick screen by PCR was negative.  CMP shows a chloride of 116 and CO2 of 14 mmol/L.  Glucose 190, BUN 51, creatinine 2.68 and calcium 7.7 mg/dL.  Baseline creatinine is usually under 2.0, mostly in the 1.6-1.9 range.  LFTs are normal, except for an AST of 8 units/L. ? ?Review of Systems: As  mentioned in the history of present illness. All other systems reviewed and are negative. ? ?Past Medical History:  ?Diagnosis Date  ? Arthritis   ? Chronic kidney disease   ? reduced kidney function. Stage III  ? Diabetes mellitus without complication ([place])   ? DVT (deep venous thrombosis) ([place])   ? age indeterminate LLE DVT 02/09/20  ? Dyspnea   ? On exertion  ? History of kidney stones   ? History of pulmonary embolus (PE)   ? Hypertension   ? Lipidemia   ? Prostate enlargement   ? Pulmonary embolism ([place]) 2009  ? Sleep apnea   ? ?Past Surgical History:  ?Procedure Laterality Date  ? CYSTOSCOPY W/ URETERAL STENT PLACEMENT Left 10/08/2020  ? Procedure: CYSTOSCOPY WITH RETROGRADE PYELOGRAM/URETERAL STENT PLACEMENT;  Surgeon: Irine Seal, MD;  Location: [facility];  Service: Urology;  Laterality: Left;  ? CYSTOSCOPY/URETEROSCOPY/HOLMIUM LASER/STENT PLACEMENT Right 06/07/2020  ? Procedure: CYSTOSCOPY/URETEROSCOPY/HOLMIUM LASER/STENT PLACEMENT;  Surgeon: Lucas Mallow, MD;  Location: [facility];  Service: Urology;  Laterality: Right;  ? CYSTOSCOPY/URETEROSCOPY/HOLMIUM LASER/STENT PLACEMENT Left 10/19/2020  ? Procedure: CYSTOSCOPY LEFT URETEROSCOPY/HOLMIUM LASER/STENT EXCHANGE;  Surgeon: Irine Seal, MD;  Location: [facility];  Service: Urology;  Laterality: Left;  ? TOTAL KNEE ARTHROPLASTY Left 08/06/2021  ? Procedure: LEFT TOTAL KNEE ARTHROPLASTY;  Surgeon: Leandrew Koyanagi, MD;  Location: [facility];  Service: Orthopedics;  Laterality: Left;  ? ?Social History:  reports that he has never smoked. He has never used smokeless tobacco. He reports current alcohol use of about 1.0  standard drink per week. He reports that he does not use drugs. ? ?No Known Allergies ? ?Family History  ?Problem Relation Age of Onset  ? Cancer Mother   ? Heart disease Father   ? ? ?Prior to Admission medications   ?Medication Sig Start Date End Date Taking? Authorizing Provider  ?acetaminophen (TYLENOL) 650 MG CR tablet Take 1,300 mg by mouth 2 (two) times daily.    Yes [provider]  ?diclofenac Sodium (VOLTAREN) 1 % GEL Apply 1 application topically 4 (four) times daily as needed (pain).   Yes [provider]  ?Dulaglutide (TRULICITY) 1.5 TD/3.2KG SOPN Inject 1.5 mg into the skin once a week. ?Patient taking differently: Inject 1.5 mg into the skin once a week. Sundays 08/30/21  Yes Shamleffer, Melanie Crazier, MD  ?gabapentin (NEURONTIN) 300 MG capsule Take 2 capsules (600 mg total) by mouth 2 (two) times daily. ?Patient taking differently: Take 300 mg by mouth 4 (four) times daily. 12/25/21  Yes [name], MD  ?insulin NPH-regular Human (HUMULIN 70/30) (70-30) 100 UNIT/ML injection Inject 35 Units into the skin 2 (two) times daily with a meal.   Yes [provider]  ?losartan (COZAAR) 50 MG tablet Take 50 mg by mouth daily. 02/23/22  Yes [provider]  ?lovastatin (MEVACOR) 40 MG tablet Take 1 tablet (40 mg total) by mouth at bedtime. 12/25/21  Yes [name], MD  ?warfarin (COUMADIN) 5 MG tablet Take 5-7.5 mg by mouth See admin instructions. Taking 5 mg on Mon, Tues, Wed, Friday and Sunday, taking 7.5 mg on Thursday and Saturday.   Yes [provider]  ?ACCU-CHEK GUIDE test strip USE TO CHECK BLOOD SUGAR 3 TIMES DAILY.E11.65 05/22/21   Shamleffer, Melanie Crazier, MD  ?Accu-Chek Softclix Lancets lancets Use as instructed to check blood sugar 3 times daily Dx is E11.65 09/18/20   Shamleffer, Melanie Crazier, MD  ?Blood Glucose Monitoring Suppl (ACCU-CHEK AVIVA) device by Other route. Use as instructed    [provider]  ?Continuous Blood Gluc Sensor (DEXCOM G6 SENSOR) MISC 1 Device by Does not apply route as directed. 03/05/22   Shamleffer, Melanie Crazier, MD  ?Continuous Blood Gluc Transmit (DEXCOM G6 TRANSMITTER) MISC 1 Device by Does not apply route as directed. 03/05/22   Shamleffer, Melanie Crazier, MD  ?insulin glargine (LANTUS) 100 UNIT/ML injection Inject 0.35 mLs (35 Units total) into  the skin daily. 03/05/22   Shamleffer, Melanie Crazier, MD  ?insulin lispro (HUMALOG KWIKPEN) 100 UNIT/ML KwikPen Max daily 50 units 03/05/22   Shamleffer, Melanie Crazier, MD  ?Insulin Pen Needle 32G X 4 MM MISC 1 Device by Does not apply route 3 (three) times daily. 03/05/22   Shamleffer, Melanie Crazier, MD  ?Insulin Syringe-Needle U-100 (INSULIN SYRINGES) 31G X 5/16" 0.5 ML MISC 1 Device by Does not apply route daily in the afternoon. 03/05/22   Shamleffer, Melanie Crazier, MD  ?Insulin Syringes, Disposable, U-100 1 ML MISC 1 Device by Does not apply route as directed. 03/28/21   [name], MD  ? ? ?Physical Exam: ?Vitals:  ? 03/12/22 0545 03/12/22 0615 03/12/22 0715 03/12/22 1015  ?BP: 132/61 119/60 136/78 (!) 149/78  ?Pulse: 79 83 90 92  ?Resp: '13 16 17 14  '$ ?Temp:      ?TempSrc:      ?SpO2: 100% 100% 100% 100%  ?Weight:      ?Height:      ? ?Physical Exam ?Vitals and nursing note reviewed.  ?Constitutional:   ?  Appearance: He is obese.  ?HENT:  ?   Head: Normocephalic.  ?   Mouth/Throat:  ?   Mouth: Mucous membranes are dry.  ?Eyes:  ?   General: No scleral icterus. ?   Pupils: Pupils are equal, round, and reactive to light.  ?Neck:  ?   Vascular: No JVD.  ?Cardiovascular:  ?   Rate and Rhythm: Normal rate and regular rhythm.  ?   Heart sounds: S1 normal and S2 normal.  ?Pulmonary:  ?   Effort: Pulmonary effort is normal.  ?   Breath sounds: Normal breath sounds. No wheezing, rhonchi or rales.  ?Abdominal:  ?   General: Abdomen is protuberant. Bowel sounds are normal. There is distension.  ?   Palpations: Abdomen is soft.  ?   Tenderness: There is no abdominal tenderness. There is no right CVA tenderness or left CVA tenderness.  ?   Comments: .Mildly distended.  ?Musculoskeletal:  ?   Cervical back: Neck supple.  ?   Right lower leg: No edema.  ?   Left lower leg: No edema.  ?Skin: ?   General: Skin is warm and dry.  ?Neurological:  ?   General: No focal deficit present.  ?   Mental Status: He is alert  and oriented to person, place, and time.  ?Psychiatric:     ?   Mood and Affect: Mood normal.     ?   Behavior: Behavior normal.  ? ? ?Data Reviewed: ? ?There are no new results to review at this time. ? ?As

## 2022-03-12 NOTE — ED Provider Notes (Signed)
?[facility] DEPT ?Provider Note ? ? ?CSN: [id number] ?Arrival date & time: 03/12/22  0457 ? ?  ? ?History ? ?Chief Complaint  ?Patient presents with  ? Diarrhea  ? ? ?[name] is a 66 y.o. male. ? ?The history is provided by the patient.  ?Diarrhea ?He has history of hypertension, diabetes, hyperlipidemia, pulmonary embolism anticoagulated on warfarin and comes in because of diarrhea.  He was started on dulaglutide about 6 months ago and frequently has diarrhea which starts the day after injecting it and resolves within 1-2 days.  He had his weekly injection 3 days ago, and started having diarrhea 2 days ago.  However, yesterday, diarrhea started getting worse and got much worse overnight to the point where he was having bowel movements every 30 minutes.  There has been some mild abdominal cramping.  He denies fever or sweats but did have some chills.  There has been some mild nausea but no vomiting.  He denies any blood or mucus in the stool.  Of note, he had been admitted to the hospital for cellulitis in February and had been on antibiotics for that.  He denies any sick contacts. ?  ?Home Medications ?Prior to Admission medications   ?Medication Sig Start Date End Date Taking? Authorizing Provider  ?ACCU-CHEK GUIDE test strip USE TO CHECK BLOOD SUGAR 3 TIMES DAILY.E11.65 05/22/21   Shamleffer, Melanie Crazier, MD  ?Accu-Chek Softclix Lancets lancets Use as instructed to check blood sugar 3 times daily Dx is E11.65 09/18/20   Shamleffer, Melanie Crazier, MD  ?Blood Glucose Monitoring Suppl (ACCU-CHEK AVIVA) device by Other route. Use as instructed    [provider]  ?cefadroxil (DURICEF) 500 MG capsule Take one daily 12/28/21   [name], MD  ?Continuous Blood Gluc Sensor (DEXCOM G6 SENSOR) MISC 1 Device by Does not apply route as directed. 03/05/22   Shamleffer, Melanie Crazier, MD  ?Continuous Blood Gluc Transmit (DEXCOM G6 TRANSMITTER) MISC 1 Device by Does not  apply route as directed. 03/05/22   Shamleffer, Melanie Crazier, MD  ?diclofenac Sodium (VOLTAREN) 1 % GEL Apply 1 application topically 4 (four) times daily as needed (pain).    [provider]  ?Dulaglutide (TRULICITY) 1.5 WF/0.9NA SOPN Inject 1.5 mg into the skin once a week. ?Patient taking differently: Inject 1.5 mg into the skin once a week. Sundays 08/30/21   Shamleffer, Melanie Crazier, MD  ?furosemide (LASIX) 20 MG tablet Take 1 tablet (20 mg total) by mouth daily. 12/19/21   [name], MD  ?gabapentin (NEURONTIN) 300 MG capsule Take 2 capsules (600 mg total) by mouth 2 (two) times daily. 12/25/21   [name], MD  ?insulin glargine (LANTUS) 100 UNIT/ML injection Inject 0.35 mLs (35 Units total) into the skin daily. 03/05/22   Shamleffer, Melanie Crazier, MD  ?insulin lispro (HUMALOG KWIKPEN) 100 UNIT/ML KwikPen Max daily 50 units 03/05/22   Shamleffer, Melanie Crazier, MD  ?Insulin Pen Needle 32G X 4 MM MISC 1 Device by Does not apply route 3 (three) times daily. 03/05/22   Shamleffer, Melanie Crazier, MD  ?Insulin Syringe-Needle U-100 (INSULIN SYRINGES) 31G X 5/16" 0.5 ML MISC 1 Device by Does not apply route daily in the afternoon. 03/05/22   Shamleffer, Melanie Crazier, MD  ?Insulin Syringes, Disposable, U-100 1 ML MISC 1 Device by Does not apply route as directed. 03/28/21   [name], MD  ?losartan (COZAAR) 50 MG tablet Take 50 mg by mouth daily. 02/23/22   [provider]  ?lovastatin (MEVACOR) 40 MG tablet Take 1 tablet (40 mg total) by mouth at bedtime. 12/25/21   [name], MD  ?methocarbamol (ROBAXIN) 500 MG tablet Take 1 tablet (500 mg total) by mouth 2 (two) times daily as needed. To be taken after surgery 07/31/21   Aundra Dubin, PA-C  ?montelukast (SINGULAIR) 10 MG tablet Take 1 tablet (10 mg total) by mouth at bedtime. 11/20/20   Kennyth Arnold, FNP  ?sodium bicarbonate 650 MG tablet Take 1 tablet (650 mg total) by mouth 2 (two) times  daily. 12/13/21   Regalado, Belkys A, MD  ?warfarin (COUMADIN) 4 MG tablet TAKE 1 TABLET BY MOUTH EVERY DAY 02/21/22   [name], MD  ?   ? ?Allergies    ?Patient has no known allergies.   ? ?Review of Systems   ?Review of Systems  ?Gastrointestinal:  Positive for diarrhea.  ?All other systems reviewed and are negative. ? ?Physical Exam ?Updated Vital Signs ?BP (!) 154/75   Pulse 86   Temp 97.8 ?F (36.6 ?C) (Oral)   Resp (!) 22   Ht '6\' 1"'$  (1.854 m)   Wt 105.8 kg   SpO2 100%   BMI 30.77 kg/m?  ?Physical Exam ?Vitals and nursing note reviewed.  ?66 year old male, resting comfortably and in no acute distress. Vital signs are significant for elevated blood pressure and respiratory rate. Oxygen saturation is 100%, which is normal. ?Head is normocephalic and atraumatic. PERRLA, EOMI. Oropharynx is clear. ?Neck is nontender and supple without adenopathy or JVD. ?Back is nontender and there is no CVA tenderness. ?Lungs are clear without rales, wheezes, or rhonchi. ?Chest is nontender. ?Heart has regular rate and rhythm without murmur. ?Abdomen is soft, slightly distended, nontender without masses or hepatosplenomegaly and peristalsis is normoactive. ?Extremities have no cyanosis or edema, full range of motion is present. ?Skin is warm and dry without rash. ?Neurologic: Mental status is normal, cranial nerves are intact, moves all extremities equally. ? ?ED Results / Procedures / Treatments   ?Labs ?(all labs ordered are listed, but only abnormal results are displayed) ?Labs Reviewed  ?COMPREHENSIVE METABOLIC PANEL - Abnormal; Notable for the following components:  ?    Result Value  ? Chloride 116 (*)   ? CO2 14 (*)   ? Glucose, Bld 190 (*)   ? BUN 51 (*)   ? Creatinine, Ser 2.68 (*)   ? Calcium 7.7 (*)   ? AST 8 (*)   ? GFR, Estimated 25 (*)   ? All other components within normal limits  ?CBC WITH DIFFERENTIAL/PLATELET - Abnormal; Notable for the following components:  ? RBC 4.07 (*)   ? Hemoglobin 12.7  (*)   ? HCT 37.9 (*)   ? RDW 15.9 (*)   ? All other components within normal limits  ?PROTIME-INR - Abnormal; Notable for the following components:  ? Prothrombin Time 29.5 (*)   ? INR 2.8 (*)   ? All other components within normal limits  ?C DIFFICILE QUICK SCREEN W PCR REFLEX    ? ?EKG ?EKG Interpretation ? ?Date/Time:  Tuesday Mar 12 2022 05:09:13 EDT ?Ventricular Rate:  84 ?PR Interval:  175 ?QRS Duration: 104 ?QT Interval:  378 ?QTC Calculation: 447 ?R Axis:   -12 ?Text Interpretation: Sinus rhythm Normal ECG When compared with ECG of 12/06/2021, No significant change was found Confirmed by Delora Fuel ([id number]) on 03/12/2022 7:10:11 AM ? ?Procedures ?Procedures  ? ? ?Medications Ordered in ED ?  Medications  ?lactated ringers bolus 1,000 mL (has no administration in time range)  ?loperamide (IMODIUM) capsule 4 mg (has no administration in time range)  ? ? ?ED Course/ Medical Decision Making/ A&P ?  ?                        ?Medical Decision Making ?Amount and/or Complexity of Data Reviewed ?Labs: ordered. ? ?Risk ?Prescription drug management. ? ? ?Diarrhea, possibly secondary to dulaglutide.  Also, consider possibility of C. difficile infection given course of antibiotics 3 months ago.  We will check orthostatic vital signs to see if he has orthostatic hypotension from dehydration.  He will be given IV fluids, loperamide.  Screening labs are checked.  Stool C. difficile PCR test has been ordered.  Old records are reviewed confirming hospitalization 12/06/2021 and discharged 12/13/2021, treated with cefazolin in the hospital on cefadroxil at home. ? ?ECG shows no acute changes.  Labs are significant for elevated BUN and creatinine compared with values on discharge from the hospital on 12/19/2021.  Hemoglobin has risen, likely secondary to dehydration.  Because of acute kidney injury, I feel he needs to be admitted for ongoing IV hydration and monitoring renal function.  Specimen has been sent for C. difficile PCR testing,  we will will need to initiate antibiotic treatment if positive.  Case is discussed with Dr. Olevia Bowens of [facility], who agrees to admit the patient. ? ?Final Clinical Impression(s) / ED Diagnoses ?Final

## 2022-03-13 ENCOUNTER — Ambulatory Visit (HOSPITAL_BASED_OUTPATIENT_CLINIC_OR_DEPARTMENT_OTHER): Payer: Medicare Other | Admitting: Physician Assistant

## 2022-03-13 DIAGNOSIS — Y92009 Unspecified place in unspecified non-institutional (private) residence as the place of occurrence of the external cause: Secondary | ICD-10-CM | POA: Diagnosis not present

## 2022-03-13 DIAGNOSIS — Z8249 Family history of ischemic heart disease and other diseases of the circulatory system: Secondary | ICD-10-CM | POA: Diagnosis not present

## 2022-03-13 DIAGNOSIS — G4730 Sleep apnea, unspecified: Secondary | ICD-10-CM | POA: Diagnosis present

## 2022-03-13 DIAGNOSIS — Z7901 Long term (current) use of anticoagulants: Secondary | ICD-10-CM | POA: Diagnosis not present

## 2022-03-13 DIAGNOSIS — E872 Acidosis, unspecified: Secondary | ICD-10-CM | POA: Diagnosis present

## 2022-03-13 DIAGNOSIS — N1832 Chronic kidney disease, stage 3b: Secondary | ICD-10-CM | POA: Diagnosis present

## 2022-03-13 DIAGNOSIS — T50995A Adverse effect of other drugs, medicaments and biological substances, initial encounter: Secondary | ICD-10-CM | POA: Diagnosis present

## 2022-03-13 DIAGNOSIS — Z86711 Personal history of pulmonary embolism: Secondary | ICD-10-CM | POA: Diagnosis not present

## 2022-03-13 DIAGNOSIS — K521 Toxic gastroenteritis and colitis: Secondary | ICD-10-CM | POA: Diagnosis present

## 2022-03-13 DIAGNOSIS — D631 Anemia in chronic kidney disease: Secondary | ICD-10-CM | POA: Diagnosis present

## 2022-03-13 DIAGNOSIS — M1990 Unspecified osteoarthritis, unspecified site: Secondary | ICD-10-CM | POA: Diagnosis present

## 2022-03-13 DIAGNOSIS — E7800 Pure hypercholesterolemia, unspecified: Secondary | ICD-10-CM | POA: Diagnosis present

## 2022-03-13 DIAGNOSIS — E669 Obesity, unspecified: Secondary | ICD-10-CM | POA: Diagnosis present

## 2022-03-13 DIAGNOSIS — I129 Hypertensive chronic kidney disease with stage 1 through stage 4 chronic kidney disease, or unspecified chronic kidney disease: Secondary | ICD-10-CM | POA: Diagnosis present

## 2022-03-13 DIAGNOSIS — N179 Acute kidney failure, unspecified: Secondary | ICD-10-CM | POA: Diagnosis not present

## 2022-03-13 DIAGNOSIS — Z794 Long term (current) use of insulin: Secondary | ICD-10-CM | POA: Diagnosis not present

## 2022-03-13 DIAGNOSIS — E1122 Type 2 diabetes mellitus with diabetic chronic kidney disease: Secondary | ICD-10-CM | POA: Diagnosis present

## 2022-03-13 DIAGNOSIS — E1142 Type 2 diabetes mellitus with diabetic polyneuropathy: Secondary | ICD-10-CM | POA: Diagnosis present

## 2022-03-13 DIAGNOSIS — Z96652 Presence of left artificial knee joint: Secondary | ICD-10-CM | POA: Diagnosis present

## 2022-03-13 DIAGNOSIS — Z809 Family history of malignant neoplasm, unspecified: Secondary | ICD-10-CM | POA: Diagnosis not present

## 2022-03-13 DIAGNOSIS — Z7985 Long-term (current) use of injectable non-insulin antidiabetic drugs: Secondary | ICD-10-CM | POA: Diagnosis not present

## 2022-03-13 DIAGNOSIS — N400 Enlarged prostate without lower urinary tract symptoms: Secondary | ICD-10-CM | POA: Diagnosis present

## 2022-03-13 DIAGNOSIS — Z87442 Personal history of urinary calculi: Secondary | ICD-10-CM | POA: Diagnosis not present

## 2022-03-13 DIAGNOSIS — Z86718 Personal history of other venous thrombosis and embolism: Secondary | ICD-10-CM | POA: Diagnosis not present

## 2022-03-13 LAB — COMPREHENSIVE METABOLIC PANEL
ALT: 9 U/L (ref 0–44)
AST: 12 U/L — ABNORMAL LOW (ref 15–41)
Albumin: 3.3 g/dL — ABNORMAL LOW (ref 3.5–5.0)
Alkaline Phosphatase: 76 U/L (ref 38–126)
Anion gap: 10 (ref 5–15)
BUN: 43 mg/dL — ABNORMAL HIGH (ref 8–23)
CO2: 15 mmol/L — ABNORMAL LOW (ref 22–32)
Calcium: 8.3 mg/dL — ABNORMAL LOW (ref 8.9–10.3)
Chloride: 113 mmol/L — ABNORMAL HIGH (ref 98–111)
Creatinine, Ser: 2.28 mg/dL — ABNORMAL HIGH (ref 0.61–1.24)
GFR, Estimated: 31 mL/min — ABNORMAL LOW (ref >60)
Glucose, Bld: 128 mg/dL — ABNORMAL HIGH (ref 70–99)
Potassium: 4 mmol/L (ref 3.5–5.1)
Sodium: 138 mmol/L (ref 135–145)
Total Bilirubin: 0.7 mg/dL (ref 0.3–1.2)
Total Protein: 6.8 g/dL (ref 6.5–8.1)

## 2022-03-13 LAB — CBC
HCT: 41.9 % (ref 39.0–52.0)
Hemoglobin: 13.3 g/dL (ref 13.0–17.0)
MCH: 31 pg (ref 26.0–34.0)
MCHC: 31.7 g/dL (ref 30.0–36.0)
MCV: 97.7 fL (ref 80.0–100.0)
Platelets: 150 10*3/uL (ref 150–400)
RBC: 4.29 MIL/uL (ref 4.22–5.81)
RDW: 16 % — ABNORMAL HIGH (ref 11.5–15.5)
WBC: 9.8 10*3/uL (ref 4.0–10.5)
nRBC: 0 % (ref 0.0–0.2)

## 2022-03-13 LAB — GASTROINTESTINAL PANEL BY PCR, STOOL (REPLACES STOOL CULTURE)

## 2022-03-13 LAB — GLUCOSE, CAPILLARY
Glucose-Capillary: 118 mg/dL — ABNORMAL HIGH (ref 70–99)
Glucose-Capillary: 191 mg/dL — ABNORMAL HIGH (ref 70–99)
Glucose-Capillary: 94 mg/dL (ref 70–99)
Glucose-Capillary: 130 mg/dL — ABNORMAL HIGH (ref 70–99)

## 2022-03-13 LAB — PROTIME-INR
INR: 2.6 — ABNORMAL HIGH (ref 0.8–1.2)
Prothrombin Time: 27.5 seconds — ABNORMAL HIGH (ref 11.4–15.2)

## 2022-03-13 MED ORDER — LOPERAMIDE HCL 2 MG PO CAPS
4.0000 mg | ORAL_CAPSULE | Freq: Once | ORAL | Status: AC
  Administered 2022-03-13: 4 mg via ORAL
  Filled 2022-03-13: qty 2

## 2022-03-13 MED ORDER — WARFARIN SODIUM 5 MG PO TABS
5.0000 mg | ORAL_TABLET | Freq: Once | ORAL | Status: AC
  Administered 2022-03-13: 5 mg via ORAL
  Filled 2022-03-13: qty 1

## 2022-03-13 NOTE — Progress Notes (Signed)
Clarification on doctor's order re Care order/instruction: Give 2 L in a 24-hour period of time mandatorily. ? ?Per Dr. Verlon Au, give patient 2L PO fluids equal to 2L in a 24-hour period.  2L count started at 1500H today.  See I&O documentation. ?

## 2022-03-13 NOTE — Progress Notes (Signed)
?PROGRESS NOTE ? ? 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 Jeffie Pollock) plus pyelogram, BPH ?Prior admission 2/2 through 12/13/2021 severe sepsis on admission from RLE cellulitis ? ?Patient has been followed by wound care since 01/09/2022 and has been on Washta and Keflex for a while?  Klebsiella?  Enterobacter at the last visit 03/06/2022 he was doing well and the wound seemed completely healed and wrappings were discontinued ? ?He presented to [facility] ED 5/9 with diarrhea X 5 days + nausea + vomiting + near syncope--- this is in the setting of starting dulaglutide 6 months prior-he had injection 3 days prior to coming in on 5/6 and started having diarrhea 2 days previous ? ?WBC 10.4, platelet 2205 C. difficile screen negative bicarb 14 ? ?Hospital-Problem based course ? ?Diarrhea?  Infectious?  Inflammatory from dulaglutide ?C. difficile negative-recent treatment Duricef/Keflex antibiotics for R lower extremity wound ?I think on differential is dulaglutide is causing the diarrhea--this has been held-I will CC Dr. Amedeo Kinsman on this note-patient may need another agent ?AKI on admitBaseline creatinine 1.8 ?Metabolic cidosis probably secondary to loss of bicarb and diarrhea ?Acidosis seems to be some improved and I expect it will continue to improve with volume repletion-if CO2 not above 15 tomorrow, will start oral bicarb 650 twice daily ?Continue NS 50 cc/8-force fluids up to 2 L/24 H ?Chronic R LE wound followed by wound care-last cultures grew Enterobacter  ?Outpatient follow-up with wound center ?DM TY 2 followed by endocrinologist-last A1c 5-23 6.5 ?Patient has intolerance of metformin (tells me that he trialed this several years ago and had severe diarrhea) ?Explained to  him that SGPT ALT inhibitors do not typically cause this-I think that we should probably let him go home on no meds at this time-his A1c recently was well controlled at 6.5 and I will CC his endocrinologist to ensure that she is aware of this-I have told the patient that he needs to follow-up with her within 3 to 5 days ?Continue gabapentin 300 4 times daily, continue Semglee 15 daily and sensitive sliding scale-depending on CBGs may need to start the patient on 70/30 insulin as a bridge to discussion with endocrinologist ?HTN ?PTA losartan 50 on hold-would probably not resume until sees PCP/endocrinology given AKI on admit ?Prior history DVT/PE on Coumadin ?Coumadin per pharmacy-therapeutic ? ?DVT prophylaxis: Coumadin ?Code Status: Full ?Family Communication: No family present at bedside today ?Disposition:  ?Status is: Observation ?The patient will require care spanning > 2 midnights and should be moved to inpatient because:  ? ?Needs creatinine to improve significantly ?  ?Consultants:  ?None ? ?Procedures:  ? ?Antimicrobials:   ? ? ?Subjective: ?Awake coherent pleasant has not been out of bed yet ?Eating and drinking ?No fever no chills-several episodes of diarrhea this morning but we elected to give him a dose of Imodium-he feels better ? ? ?Objective: ?Vitals:  ? 03/12/22 1446 03/12/22 1847 03/12/22 2207 03/13/22 0253  ?BP: 121/85 (!) 146/79 137/70 128/74  ?Pulse: 88 94 83 91  ?Resp: '18  18 19  '$ ?Temp: 97.8 ?F (36.6 ?C) 98 ?F (36.7 ?C) 98.2 ?F (36.8 ?C) 98 ?F (36.7 ?C)  ?TempSrc: Oral Oral Oral Oral  ?SpO2: 99% 100% 100% 99%  ?Weight:      ?Height:      ? ? ?  Intake/Output Summary (Last 24 hours) at 03/13/2022 0805 ?Last data filed at 03/13/2022 0700 ?Gross per 24 hour  ?Intake 2720.42 ml  ?Output --  ?Net 2720.42 ml  ? ?Filed Weights  ? 03/12/22 0505  ?Weight: 105.8 kg  ? ? ?Examination: ? ?EOMI NCAT no focal deficit thick beard chest clear no added sound rales rhonchi ?Neck is soft supple ?Cannot appreciate any  LAM ?Posterior laterally chest is clear ?S1-S2 no murmur no rub no gallop ?ROM intact ?Neurologically intact ? ?Data Reviewed: personally reviewed  ? ?CBC ?   ?Component Value Date/Time  ? WBC 9.8 03/13/2022 0556  ? RBC 4.29 03/13/2022 0556  ? HGB 13.3 03/13/2022 0556  ? HCT 41.9 03/13/2022 0556  ? PLT 150 03/13/2022 0556  ? MCV 97.7 03/13/2022 0556  ? MCH 31.0 03/13/2022 0556  ? MCHC 31.7 03/13/2022 0556  ? RDW 16.0 (H) 03/13/2022 0556  ? LYMPHSABS 0.9 03/12/2022 0525  ? MONOABS 1.0 03/12/2022 0525  ? EOSABS 0.4 03/12/2022 0525  ? BASOSABS 0.0 03/12/2022 0525  ? ? ?  Latest Ref Rng & Units 03/13/2022  ?  5:56 AM 03/12/2022  ?  2:56 PM 03/12/2022  ?  5:25 AM  ?CMP  ?Glucose 70 - 99 mg/dL 128   149   190    ?BUN 8 - 23 mg/dL 43   50   51    ?Creatinine 0.61 - 1.24 mg/dL 2.28   2.61   2.68    ?Sodium 135 - 145 mmol/L 138   137   137    ?Potassium 3.5 - 5.1 mmol/L 4.0   3.6   3.7    ?Chloride 98 - 111 mmol/L 113   114   116    ?CO2 22 - 32 mmol/L '15   14   14    '$ ?Calcium 8.9 - 10.3 mg/dL 8.3   8.3   7.7    ?Total Protein 6.5 - 8.1 g/dL 6.8    7.5    ?Total Bilirubin 0.3 - 1.2 mg/dL 0.7    0.4    ?Alkaline Phos 38 - 126 U/L 76    77    ?AST 15 - 41 U/L 12    8    ?ALT 0 - 44 U/L 9    11    ? ? ? ?Radiology Studies: ?No results found. ? ? ?Scheduled Meds: ? feeding supplement  237 mL Oral BID BM  ? gabapentin  300 mg Oral QID  ? insulin aspart  0-9 Units Subcutaneous TID WC  ? insulin glargine-yfgn  15 Units Subcutaneous Daily  ? Warfarin - Pharmacist Dosing Inpatient   Does not apply [id number]  ? ?Continuous Infusions: ? lactated ringers 125 mL/hr at 03/13/22 0108  ? ? ? LOS: 0 days  ? ?Time spent: 41 ? ?Nita Sells, MD ?[facility] ?To contact the attending provider between 7A-7P or the covering provider during after hours 7P-7A, please log into the web site [email or web address] and access using universal [place] password for that web site. If you do not have the password, please call the hospital  operator. ? ?03/13/2022, 8:05 AM  ? ? ?

## 2022-03-13 NOTE — Progress Notes (Signed)
ANTICOAGULATION CONSULT NOTE - Initial Consult ? ?Pharmacy Consult for warfarin ?Indication:  history of PE ? ?No Known Allergies ? ?Patient Measurements: ?Height: '6\' 1"'$  (185.4 cm) ?Weight: 105.8 kg (233 lb 4 oz) ?IBW/kg (Calculated) : 79.9 ? ?Vital Signs: ?Temp: 98 ?F (36.7 ?C) (05/10 0253) ?Temp Source: Oral (05/10 0253) ?BP: 128/74 (05/10 0253) ?Pulse Rate: 91 (05/10 0253) ? ?Labs: ?Recent Labs  ?  03/12/22 ?9147 03/12/22 ?1456 03/13/22 ?8295  ?HGB 12.7*  --  13.3  ?HCT 37.9*  --  41.9  ?PLT 205  --  150  ?LABPROT 29.5*  --   --   ?INR 2.8*  --   --   ?CREATININE 2.68* 2.61* 2.28*  ? ? ? ?Estimated Creatinine Clearance: 40.2 mL/min (A) (by C-G formula based on SCr of 2.28 mg/dL (H)). ? ? ?Medical History: ?Past Medical History:  ?Diagnosis Date  ? Arthritis   ? Chronic kidney disease   ? reduced kidney function. Stage III  ? Diabetes mellitus without complication ([place])   ? DVT (deep venous thrombosis) ([place])   ? age indeterminate LLE DVT 02/09/20  ? Dyspnea   ? On exertion  ? History of kidney stones   ? History of pulmonary embolus (PE)   ? Hypertension   ? Lipidemia   ? Prostate enlargement   ? Pulmonary embolism ([place]) 2009  ? Sleep apnea   ? ? ?Medications: Warfarin PTA ?-Home dose: warfarin 7.5 mg on Thursday & Saturday; 5 mg all other days ? ?Assessment: ?Pt is a 26 yoM admitted with diarrhea. PMH significant for hx of PE for which he is anticoagulated with warfarin. Pharmacy consulted to manage/dose warfarin while inpatient.  ? ?INR on admission: 2.8 (therapeutic) ? ?Today, 03/13/22 ?INR = 2.6 is therapeutic ?CBC: Hgb WNL; Plt down slightly ?SCr improving, down to 2.28 today ?Diet: Full liquid currently, planning to advance today ?No major DDI ? ?Goal of Therapy:  ?INR 2-3 ?Monitor platelets by anticoagulation protocol: Yes ?  ?Plan:  ?Warfarin 5 mg PO today. Continue home dose.  ?Recheck INR and CBC with AM labs tomorrow.  ? ?Dimple Nanas, PharmD ?03/13/2022 7:16 AM ? ?

## 2022-03-14 LAB — GLUCOSE, CAPILLARY: Glucose-Capillary: 105 mg/dL — ABNORMAL HIGH (ref 70–99)

## 2022-03-14 LAB — BASIC METABOLIC PANEL
Anion gap: 7 (ref 5–15)
BUN: 34 mg/dL — ABNORMAL HIGH (ref 8–23)
CO2: 20 mmol/L — ABNORMAL LOW (ref 22–32)
Calcium: 8.6 mg/dL — ABNORMAL LOW (ref 8.9–10.3)
Chloride: 111 mmol/L (ref 98–111)
Creatinine, Ser: 1.9 mg/dL — ABNORMAL HIGH (ref 0.61–1.24)
GFR, Estimated: 38 mL/min — ABNORMAL LOW (ref >60)
Glucose, Bld: 146 mg/dL — ABNORMAL HIGH (ref 70–99)
Potassium: 4 mmol/L (ref 3.5–5.1)
Sodium: 138 mmol/L (ref 135–145)

## 2022-03-14 LAB — CBC
HCT: 36.6 % — ABNORMAL LOW (ref 39.0–52.0)
Hemoglobin: 12.3 g/dL — ABNORMAL LOW (ref 13.0–17.0)
MCH: 31.1 pg (ref 26.0–34.0)
MCHC: 33.6 g/dL (ref 30.0–36.0)
MCV: 92.7 fL (ref 80.0–100.0)
Platelets: 188 10*3/uL (ref 150–400)
RBC: 3.95 MIL/uL — ABNORMAL LOW (ref 4.22–5.81)
RDW: 15.9 % — ABNORMAL HIGH (ref 11.5–15.5)
WBC: 9.3 10*3/uL (ref 4.0–10.5)
nRBC: 0 % (ref 0.0–0.2)

## 2022-03-14 LAB — PROTIME-INR
INR: 3 — ABNORMAL HIGH (ref 0.8–1.2)
Prothrombin Time: 30.6 seconds — ABNORMAL HIGH (ref 11.4–15.2)

## 2022-03-14 MED ORDER — INSULIN LISPRO (1 UNIT DIAL) 100 UNIT/ML (KWIKPEN): PEN_INJECTOR | SUBCUTANEOUS | 4 refills | Status: AC

## 2022-03-14 MED ORDER — WARFARIN SODIUM 5 MG PO TABS: 5.0000 mg | ORAL_TABLET | Freq: Once | ORAL | Status: DC

## 2022-03-14 MED ORDER — INSULIN GLARGINE 100 UNIT/ML ~~LOC~~ SOLN: 20.0000 [IU] | Freq: Every day | SUBCUTANEOUS | 3 refills | Status: AC

## 2022-03-14 NOTE — Progress Notes (Signed)
ANTICOAGULATION CONSULT NOTE - Initial Consult ? ?Pharmacy Consult for warfarin ?Indication:  history of PE ? ?No Known Allergies ? ?Patient Measurements: ?Height: '6\' 1"'$  (185.4 cm) ?Weight: 105.8 kg (233 lb 4 oz) ?IBW/kg (Calculated) : 79.9 ? ?Vital Signs: ?Temp: 97.9 ?F (36.6 ?C) (05/11 5974) ?Temp Source: Oral (05/11 0437) ?BP: 111/63 (05/11 0437) ?Pulse Rate: 77 (05/11 0437) ? ?Labs: ?Recent Labs  ?  03/12/22 ?1638 03/12/22 ?1456 03/13/22 ?4536 03/13/22 ?4680 03/14/22 ?0854  ?HGB 12.7*  --  13.3  --  12.3*  ?HCT 37.9*  --  41.9  --  36.6*  ?PLT 205  --  150  --  188  ?LABPROT 29.5*  --   --  27.5*  --   ?INR 2.8*  --   --  2.6*  --   ?CREATININE 2.68* 2.61* 2.28*  --  1.90*  ? ? ? ?Estimated Creatinine Clearance: 48.2 mL/min (A) (by C-G formula based on SCr of 1.9 mg/dL (H)). ? ? ?Medical History: ?Past Medical History:  ?Diagnosis Date  ? Arthritis   ? Chronic kidney disease   ? reduced kidney function. Stage III  ? Diabetes mellitus without complication ([place])   ? DVT (deep venous thrombosis) ([place])   ? age indeterminate LLE DVT 02/09/20  ? Dyspnea   ? On exertion  ? History of kidney stones   ? History of pulmonary embolus (PE)   ? Hypertension   ? Lipidemia   ? Prostate enlargement   ? Pulmonary embolism ([place]) 2009  ? Sleep apnea   ? ? ?Medications: Warfarin PTA ?-Home dose: warfarin 7.5 mg on Thursday & Saturday; 5 mg all other days ? ?Assessment: ?Pt is a 51 yoM admitted with diarrhea. PMH significant for hx of PE for which he is anticoagulated with warfarin. Pharmacy consulted to manage/dose warfarin while inpatient.  ? ?INR on admission: 2.8 (therapeutic) ? ?Today, 03/14/22 ?INR = 3 today. This is therapeutic, however is increased from 2.6 yesterday (likely due to changes in diet) ?CBC: Hgb down slightly 13.3 > 12.3; Plts stable ?SCr improving, down to 1.9 today ?Diet: advanced to regular diet on 5/11 ?No major DDI ? ?Goal of Therapy:  ?INR 2-3 ?Monitor platelets by anticoagulation protocol: Yes ?  ?Plan:   ?Warfarin 5 mg PO today (slight reduction from home dose) ?Recheck INR and CBC with AM labs tomorrow.  ? ?Dimple Nanas, PharmD ?03/14/2022 9:52 AM ? ?

## 2022-03-14 NOTE — Discharge Summary (Signed)
Physician Discharge Summary  ?[name] IFO:[id number] DOB: 1956-10-12 DOA: 03/12/2022 ? ?PCP: [name], MD ? ?Admit date: 03/12/2022 ?Discharge date: 03/14/2022 ? ?Time spent: 46 minutes ? ?Recommendations for Outpatient Follow-up:  ?Patient discontinued off Trulicity this admission-note that patient had previously been prescribed long-acting insulin in addition to short acting insulin per endocrinologist-patient had not picked these up-patient understands how to dial up insulin and use hands he will follow-up with PCP versus endocrinologist in 1 week for titration of meds ?Get Chem-12 in about 1 week--note that losartan from admission has been held-please consider either lower dose initiation in the outpatient setting versus amlodipine initiation as an outpatient ? ?Discharge Diagnoses:  ?MAIN problem for hospitalization  ? ?AKI ? ?Please see below for itemized issues addressed in HOpsital- ?refer to other progress notes for clarity if needed ? ?Discharge Condition: Improved ? ?Diet recommendation: Diabetic ? ?Filed Weights  ? 03/12/22 0505  ?Weight: 105.8 kg  ? ? 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 Jeffie Pollock) plus pyelogram, BPH ?Prior admission 2/2 through 12/13/2021 severe sepsis on admission from RLE cellulitis ?  ?Patient has been followed by wound care since 01/09/2022 and has been on Coffey and Keflex for a while?  Klebsiella?  Enterobacter at the last visit 03/06/2022 he was doing well and the wound seemed completely healed and wrappings were discontinued ?  ?He presented to [facility] ED 5/9 with diarrhea X 5 days + nausea + vomiting + near syncope--- this is in the setting of starting dulaglutide 6 months prior-he had injection 3 days prior to coming in on 5/6 and started having diarrhea 2 days previous ?  ?WBC 10.4, platelet 2205  C. difficile screen negative bicarb 14 ?  ? ?Hospital Course:  ?Diarrhea?  Infectious?  Inflammatory from dulaglutide ?C. difficile negative-recent treatment Duricef/Keflex antibiotics for R lower extremity wound ?I think on differential is dulaglutide is causing the diarrhea--this has been held-I will CC Dr. Amedeo Kinsman on this note-patient may need another agent  ?AKI on admitBaseline creatinine 1.8 ?Metabolic cidosis probably secondary to loss of bicarb and diarrhea ?Acidosis improved on discharge did not need bicarb or other resuscitation ?Continue NS 50 cc/8-force fluids up to 2 L/24 H ?Chronic R LE wound followed by wound care-last cultures grew Enterobacter  ?Outpatient follow-up with wound center ?DM TY 2 followed by endocrinologist-last A1c 5-23 6.5 ?Patient has intolerance of metformin (tells me that he trialed this several years ago and had severe diarrhea) ?Explained to him that S-GPLT inhibitors do not typically cause this-I think that we should probably let him go home on no oraL meds at this time-his A1c recently was well controlled at 6.5 and I will CC his endocrinologist to ensure that she is aware of this-I have told the patient that he needs to follow-up with her within 3 to 5 days ?Continue gabapentin 300 4 times daily ?He was prescribed insulin by endocrinologist which I think he should be able to use at home----he does have an understanding of sliding scales and was given 1 on his last office visit ?HTN ?PTA losartan 50 on hold-would probably not resume until sees PCP/endocrinology given AKI on admit ?Prior history DVT/PE on Coumadin ?Coumadin per pharmacy-therapeutic ? ? ?Discharge Exam: ?Vitals:  ? 03/13/22 2100 03/14/22 0437  ?BP: (!) 141/70 111/63  ?Pulse: 77 77  ?Resp:  16 18  ?Temp: 97.8 ?F (36.6 ?C) 97.9 ?F (36.6 ?C)  ?SpO2: 100% 99%  ? ? ?Subj on day of d/c ?  ?Awake coherent no distress EOMI NCAT no focal deficit ?ROM intact ?Pleasant alert coherent ? ?Discharge Instructions ? ? ?Discharge  Instructions   ? ? Diet - low sodium heart healthy   Complete by: As directed ?  ? Discharge instructions   Complete by: As directed ?  ? Do not take Trulicity going forward as this probably was one of the causes of your diarrhea ?Noticed that we have carefully adjusted downwards your long-acting insulin which is called Lantus-we have put the dose of this at 20 units-in addition we you will probably need to take sliding scale coverage insulin  ? Discharge instructions   Complete by: As directed ?  ? Look at your medications carefully as several have changed slightly you have been discontinued off of your Trulicity we will start you on the insulin that you are supposed to be on ?I have tried to contact Dr. Corinne Ports office but you may have better luck-please let her know of your admission to the hospital and that we think that this could have been secondary to your diabetes meds if she can direct you in terms of neck steps and management ? ?Either her your primary care physician can get labs in about a week to make sure that your kidney function is improving as it should  ? Increase activity slowly   Complete by: As directed ?  ? Increase activity slowly   Complete by: As directed ?  ? ?  ? ?Allergies as of 03/14/2022   ?No Known Allergies ?  ? ?  ?Medication List  ?  ? ?STOP taking these medications   ? ?diclofenac Sodium 1 % Gel ?Commonly known as: VOLTAREN ?  ?HumuLIN 70/30 (70-30) 100 UNIT/ML injection ?Generic drug: insulin NPH-regular Human ?  ?Trulicity 1.5 JT/7.0VX Sopn ?Generic drug: Dulaglutide ?  ? ?  ? ?TAKE these medications   ? ?Accu-Chek Aviva device ?by Other route. Use as instructed ?  ?Accu-Chek Guide test strip ?Generic drug: glucose blood ?USE TO CHECK BLOOD SUGAR 3 TIMES DAILY.E11.65 ?  ?Accu-Chek Softclix Lancets lancets ?Use as instructed to check blood sugar 3 times daily Dx is E11.65 ?  ?acetaminophen 650 MG CR tablet ?Commonly known as: TYLENOL ?Take 1,300 mg by mouth 2 (two) times daily. ?   ?Dexcom G6 Sensor Misc ?1 Device by Does not apply route as directed. ?  ?Dexcom G6 Transmitter Misc ?1 Device by Does not apply route as directed. ?  ?gabapentin 300 MG capsule ?Commonly known as: NEURONTIN ?Take 2 capsules (600 mg total) by mouth 2 (two) times daily. ?What changed:  ?how much to take ?when to take this ?  ?insulin glargine 100 UNIT/ML injection ?Commonly known as: Lantus ?Inject 0.2 mLs (20 Units total) into the skin daily. ?What changed: how much to take ?  ?insulin lispro 100 UNIT/ML KwikPen ?Commonly known as: HumaLOG KwikPen ?Contact your endocrinologist and/or use a sliding scale that you have used before-to be clear this is a short acting insulin and she must of given you instructions with regards to its use ?What changed: additional instructions ?  ?Insulin Pen Needle 32G X 4 MM Misc ?1 Device by Does not apply route 3 (three) times daily. ?  ?Insulin Syringes (Disposable) U-100 1 ML Misc ?1 Device by Does not apply route as directed. ?  ?Insulin Syringes 31G X  5/16" 0.5 ML Misc ?1 Device by Does not apply route daily in the afternoon. ?  ?losartan 50 MG tablet ?Commonly known as: COZAAR ?Take 50 mg by mouth daily. ?  ?lovastatin 40 MG tablet ?Commonly known as: MEVACOR ?Take 1 tablet (40 mg total) by mouth at bedtime. ?  ?warfarin 5 MG tablet ?Commonly known as: COUMADIN ?Take as directed. If you are unsure how to take this medication, talk to your nurse or doctor. ?Original instructions: Take 5-7.5 mg by mouth See admin instructions. Taking 5 mg on Mon, Tues, Wed, Friday and Sunday, taking 7.5 mg on Thursday and Saturday. ?  ? ?  ? ?No Known Allergies ? ? ? ?The results of significant diagnostics from this hospitalization (including imaging, microbiology, ancillary and laboratory) are listed below for reference.   ? ?Significant Diagnostic Studies: ?No results found. ? ?Microbiology: ?Recent Results (from the past 240 hour(s))  ?C Difficile Quick Screen w PCR reflex     Status: None  ?  Collection Time: 03/12/22  6:02 AM  ? Specimen: STOOL  ?Result Value Ref Range Status  ? C Diff antigen NEGATIVE NEGATIVE Final  ? C Diff toxin NEGATIVE NEGATIVE Final  ? C Diff interpretation No C. di

## 2022-03-20 ENCOUNTER — Ambulatory Visit (INDEPENDENT_AMBULATORY_CARE_PROVIDER_SITE_OTHER): Payer: Medicare Other

## 2022-03-20 DIAGNOSIS — Z7901 Long term (current) use of anticoagulants: Secondary | ICD-10-CM

## 2022-03-20 LAB — POCT INR: INR: 1.2 — AB (ref 2.0–3.0)

## 2022-03-20 NOTE — Patient Instructions (Addendum)
Pre visit review using our clinic review tool, if applicable. No additional management support is needed unless otherwise documented below in the visit note. ? ?Increase dose today to take 7.5 mg and increase dose tomorrow to take 10 mg and then change weekly dose to take 7.5 mg daily except take 5 mg on Mondays, Wednesdays and Fridays. Recheck in 1 week.  ?

## 2022-03-20 NOTE — Progress Notes (Signed)
Increase dose today to take 7.5 mg and increase dose tomorrow to take 10 mg and then change weekly dose to take 7.5 mg daily except take 5 mg on Mondays, Wednesdays and Fridays. Recheck in 1 week.  ?

## 2022-03-27 ENCOUNTER — Encounter (HOSPITAL_BASED_OUTPATIENT_CLINIC_OR_DEPARTMENT_OTHER): Payer: Medicare Other | Admitting: Physician Assistant

## 2022-03-27 ENCOUNTER — Ambulatory Visit (INDEPENDENT_AMBULATORY_CARE_PROVIDER_SITE_OTHER): Payer: Medicare Other

## 2022-03-27 DIAGNOSIS — I872 Venous insufficiency (chronic) (peripheral): Secondary | ICD-10-CM | POA: Diagnosis not present

## 2022-03-27 DIAGNOSIS — E11621 Type 2 diabetes mellitus with foot ulcer: Secondary | ICD-10-CM | POA: Diagnosis not present

## 2022-03-27 DIAGNOSIS — L97212 Non-pressure chronic ulcer of right calf with fat layer exposed: Secondary | ICD-10-CM | POA: Diagnosis not present

## 2022-03-27 DIAGNOSIS — I890 Lymphedema, not elsewhere classified: Secondary | ICD-10-CM | POA: Diagnosis not present

## 2022-03-27 DIAGNOSIS — E11622 Type 2 diabetes mellitus with other skin ulcer: Secondary | ICD-10-CM | POA: Diagnosis not present

## 2022-03-27 DIAGNOSIS — Z7901 Long term (current) use of anticoagulants: Secondary | ICD-10-CM | POA: Diagnosis not present

## 2022-03-27 DIAGNOSIS — Z86718 Personal history of other venous thrombosis and embolism: Secondary | ICD-10-CM | POA: Diagnosis not present

## 2022-03-27 DIAGNOSIS — L97812 Non-pressure chronic ulcer of other part of right lower leg with fat layer exposed: Secondary | ICD-10-CM | POA: Diagnosis not present

## 2022-03-27 LAB — POCT INR: INR: 1.8 — AB (ref 2.0–3.0)

## 2022-03-27 NOTE — Progress Notes (Signed)
Glass, [name] ([id number]) Visit Report for 03/27/2022 Chief Complaint Document Details Patient Name: Date of Service: [name], Glass 03/27/2022 9:00 A M Medical Record Number: [id number] Patient Account Number: [id number] Date of Birth/Sex: Treating RN: 05-17-1956 (66 y.o. Hessie Diener Primary Care Provider: Abelino Derrick Other Clinician: Referring Provider: Treating Provider/Extender: Hedda Slade in Treatment: 11 Information Obtained from: Patient Chief Complaint Right LE Ulcer Electronic Signature(s) Signed: 03/27/2022 9:38:30 AM By: Worthy Keeler PA-C Entered By: Worthy Keeler on 03/27/2022 09:38:30 -------------------------------------------------------------------------------- HPI Details Patient Name: Date of Service: [name], Glass 03/27/2022 9:00 [place] Record Number: [id number] Patient Account Number: [id number] Date of Birth/Sex: Treating RN: [birth year] (66 y.o. Hessie Diener Primary Care Provider: Abelino Derrick Other Clinician: Referring Provider: Treating Provider/Extender: Hedda Slade in Treatment: 11 History of Present Illness HPI Description: 01/09/2022 upon evaluation today patient appears to be doing well with regard to his leg compared to where things have been. I did review his note and it appears he had significant cellulitis. He was actually in the hospital due to sepsis from February 19 February 13. The good news is he recovered from that he was placed on Duricef for a time and then this was after the Keflex. The Keflex was really not effective. Eventually he was transitioned to Bactrim which is what he has been taking currently he tells me when they switch to that is when things really got better. That was due to a culture which showed Enterobacter as the main organism with Klebsiella being a secondary. Nonetheless in the end I do feel like that the Bactrim has done a good job for him based on what he  is telling me and what I am seeing. He did have a CT scan which was on 12/07/2021 which showed cellulitis fortunately nothing deeper structure was infected and no abscess. He also had a DVT study on 12/07/2020 which was negative. On 2/24 is when the culture was performed. His most recent hemoglobin A1c was 6.4 according to his primary care provider and the patient currently is on the Bactrim for a couple more days along with an Unna boot and alginate which appears to been the order although what he had was Curlex and Coban on when he came in today with alginate. Patient does have a history of chronic venous insufficiency, lymphedema, diabetes mellitus type 2, long-term use of anticoagulant therapy due to a personal history of DVT. 01/23/2022 upon evaluation today patient appears to be doing excellent in regard to his leg ulcer. This is significantly improved compared to his first visit. Overall both he and I are both extremely pleased with where things stand today his pain is better the infection seems to be cleared and overall I think you are on the right path. 01/30/2022 upon evaluation today patient appears to be doing well currently in regard to his leg ulcers. He has been tolerating the dressing changes without complication. Fortunately I do not see any evidence of active infection locally or systemically which is great news. No fevers, chills, nausea, vomiting, or diarrhea. 02-06-2022 on evaluation today patient's wound is actually showing signs of significant improvement. We are getting very close to resolution on I am extremely pleased with where we stand today. I do not see any signs of active infection locally or systemically at this time which is great news. 02-13-2022 upon evaluation today patient appears to be doing well with regard to his wounds. He has been tolerating  the dressing changes without complication overall very pleased with where we stand today. 02-20-2022 upon evaluation today  patient appears to be doing excellent in regard to his leg. In fact this is significantly smaller and overall I feel like we are doing quite well. He is very close to complete resolution. With that being said I do believe we still have a little ways to go to get this completely closed. I think within the next 1-2 weeks would likely get a be done. Home health again only wrapped him from the ankle up not including the foot. Honestly I think at this point home health is probably not necessary and we can just discontinue that altogether. 02-27-2022 upon evaluation today patient appears to be doing well with regard to his leg ulcer. He has been Tolerating the dressing changes without complication. Fortunately I do not see any evidence of infection locally or systemically which is great news overall very pleased with where we stand at this point. No fevers, chills, nausea, vomiting, or diarrhea. 03-06-2022 upon evaluation today patient appears to be doing well with regard to his wound in fact this appears to be pretty much completely healed based on what I am seeing though he probably needs to be wrap 1 more week. 03-27-2022 upon evaluation today patient appears to be doing well currently in regard to his wound. He has been tolerating the dressing changes without complication and in fact appears to be completely healed which is great news. Electronic Signature(s) Signed: 03/27/2022 4:03:39 PM By: Worthy Keeler PA-C Entered By: Worthy Keeler on 03/27/2022 16:03:39 -------------------------------------------------------------------------------- Physical Exam Details Patient Name: Date of Service: [name], Glass 03/27/2022 9:00 A M Medical Record Number: [id number] Patient Account Number: [id number] Date of Birth/Sex: Treating RN: Mar 14, 1956 (66 y.o. Hessie Diener Primary Care Provider: Abelino Derrick Other Clinician: Referring Provider: Treating Provider/Extender: Hedda Slade  in Treatment: 87 Constitutional Well-nourished and well-hydrated in no acute distress. Respiratory normal breathing without difficulty. Psychiatric this patient is able to make decisions and demonstrates good insight into disease process. Alert and Oriented x 3. pleasant and cooperative. Notes Patient's wound again showed signs of complete epithelization and overall I am extremely pleased with where we stand I think that he is actually making excellent progress here. Electronic Signature(s) Signed: 03/27/2022 4:03:55 PM By: Worthy Keeler PA-C Entered By: Worthy Keeler on 03/27/2022 16:03:55 -------------------------------------------------------------------------------- Physician Orders Details Patient Name: Date of Service: [name], Glass 03/27/2022 9:00 [place] Record Number: [id number] Patient Account Number: [id number] Date of Birth/Sex: Treating RN: August 23, 1956 (66 y.o. Lorette Ang, Meta.Reding Primary Care Provider: Abelino Derrick Other Clinician: Referring Provider: Treating Provider/Extender: Hedda Slade in Treatment: [phone] Verbal / Phone Orders: No Diagnosis Coding ICD-10 Coding Code Description I87.2 Venous insufficiency (chronic) (peripheral) I89.0 Lymphedema, not elsewhere classified E11.622 Type 2 diabetes mellitus with other skin ulcer L97.812 Non-pressure chronic ulcer of other part of right lower leg with fat layer exposed Z79.01 Long term (current) use of anticoagulants Z86.718 Personal history of other venous thrombosis and embolism Discharge From [facility] Discharge from [place] - Call if any future wound care needs. Edema Control - Lymphedema / SCD / Other Elevate legs to the level of the heart or above for 30 minutes daily and/or when sitting, a frequency of: - 3-4 times a day throughout the day. Avoid standing for long periods of time. Patient to wear own compression stockings every day. Exercise regularly Moisturize legs  daily. - every night before bed. Compression stocking or Garment 20-30 mm/Hg pressure to: - apply in the morning and remove at night. Wear for life. Electronic Signature(s) Signed: 03/27/2022 2:56:41 PM By: Deon Pilling RN, BSN Signed: 03/27/2022 5:33:59 PM By: Worthy Keeler PA-C Entered By: Deon Pilling on 03/27/2022 09:08:22 -------------------------------------------------------------------------------- Problem List Details Patient Name: Date of Service: [name], Glass 03/27/2022 9:00 A M Medical Record Number: [id number] Patient Account Number: [id number] Date of Birth/Sex: Treating RN: 16-Mar-1956 (66 y.o. Hessie Diener Primary Care Provider: Abelino Derrick Other Clinician: Referring Provider: Treating Provider/Extender: Hedda Slade in Treatment: 11 Active Problems ICD-10 Encounter Code Description Active Date MDM Diagnosis I87.2 Venous insufficiency (chronic) (peripheral) 01/09/2022 No Yes I89.0 Lymphedema, not elsewhere classified 01/09/2022 No Yes E11.622 Type 2 diabetes mellitus with other skin ulcer 01/09/2022 No Yes L97.812 Non-pressure chronic ulcer of other part of right lower leg with fat layer 01/09/2022 No Yes exposed Z79.01 Long term (current) use of anticoagulants 01/09/2022 No Yes Z86.718 Personal history of other venous thrombosis and embolism 01/09/2022 No Yes Inactive Problems Resolved Problems Electronic Signature(s) Signed: 03/27/2022 9:38:21 AM By: Worthy Keeler PA-C Entered By: Worthy Keeler on 03/27/2022 09:38:21 -------------------------------------------------------------------------------- Progress Note Details Patient Name: Date of Service: [name], Glass 03/27/2022 9:00 A M Medical Record Number: [id number] Patient Account Number: [id number] Date of Birth/Sex: Treating RN: [birth year] (66 y.o. Hessie Diener Primary Care Provider: Abelino Derrick Other Clinician: Referring Provider: Treating Provider/Extender: Hedda Slade in Treatment: 11 Subjective Chief Complaint Information obtained from Patient Right LE Ulcer History of Present Illness (HPI) 01/09/2022 upon evaluation today patient appears to be doing well with regard to his leg compared to where things have been. I did review his note and it appears he had significant cellulitis. He was actually in the hospital due to sepsis from February 19 February 13. The good news is he recovered from that he was placed on Duricef for a time and then this was after the Keflex. The Keflex was really not effective. Eventually he was transitioned to Bactrim which is what he has been taking currently he tells me when they switch to that is when things really got better. That was due to a culture which showed Enterobacter as the main organism with Klebsiella being a secondary. Nonetheless in the end I do feel like that the Bactrim has done a good job for him based on what he is telling me and what I am seeing. He did have a CT scan which was on 12/07/2021 which showed cellulitis fortunately nothing deeper structure was infected and no abscess. He also had a DVT study on 12/07/2020 which was negative. On 2/24 is when the culture was performed. His most recent hemoglobin A1c was 6.4 according to his primary care provider and the patient currently is on the Bactrim for a couple more days along with an Unna boot and alginate which appears to been the order although what he had was Curlex and Coban on when he came in today with alginate. Patient does have a history of chronic venous insufficiency, lymphedema, diabetes mellitus type 2, long-term use of anticoagulant therapy due to a personal history of DVT. 01/23/2022 upon evaluation today patient appears to be doing excellent in regard to his leg ulcer. This is significantly improved compared to his first visit. Overall both he and I are both extremely pleased with where things stand today his pain is better the  infection seems to  be cleared and overall I think you are on the right path. 01/30/2022 upon evaluation today patient appears to be doing well currently in regard to his leg ulcers. He has been tolerating the dressing changes without complication. Fortunately I do not see any evidence of active infection locally or systemically which is great news. No fevers, chills, nausea, vomiting, or diarrhea. 02-06-2022 on evaluation today patient's wound is actually showing signs of significant improvement. We are getting very close to resolution on I am extremely pleased with where we stand today. I do not see any signs of active infection locally or systemically at this time which is great news. 02-13-2022 upon evaluation today patient appears to be doing well with regard to his wounds. He has been tolerating the dressing changes without complication overall very pleased with where we stand today. 02-20-2022 upon evaluation today patient appears to be doing excellent in regard to his leg. In fact this is significantly smaller and overall I feel like we are doing quite well. He is very close to complete resolution. With that being said I do believe we still have a little ways to go to get this completely closed. I think within the next 1-2 weeks would likely get a be done. Home health again only wrapped him from the ankle up not including the foot. Honestly I think at this point home health is probably not necessary and we can just discontinue that altogether. 02-27-2022 upon evaluation today patient appears to be doing well with regard to his leg ulcer. He has been Tolerating the dressing changes without complication. Fortunately I do not see any evidence of infection locally or systemically which is great news overall very pleased with where we stand at this point. No fevers, chills, nausea, vomiting, or diarrhea. 03-06-2022 upon evaluation today patient appears to be doing well with regard to his wound in fact this  appears to be pretty much completely healed based on what I am seeing though he probably needs to be wrap 1 more week. 03-27-2022 upon evaluation today patient appears to be doing well currently in regard to his wound. He has been tolerating the dressing changes without complication and in fact appears to be completely healed which is great news. Patient History Information obtained from Patient, Chart. Family History Cancer - Mother, Diabetes - Mother,Father, Heart Disease - Father, No family history of Hereditary Spherocytosis, Hypertension, Kidney Disease, Lung Disease, Seizures, Stroke, Thyroid Problems, Tuberculosis. Social History Never smoker, Marital Status - Single, Alcohol Use - Rarely - beer, Drug Use - No History, Caffeine Use - Daily - daily coffee. Medical History Eyes Denies history of Cataracts, Glaucoma, Optic Neuritis Ear/Nose/Mouth/Throat Denies history of Middle ear problems Hematologic/Lymphatic Denies history of Anemia, Hemophilia, Human Immunodeficiency Virus, Lymphedema, Sickle Cell Disease Respiratory Denies history of Aspiration, Asthma, Chronic Obstructive Pulmonary Disease (COPD), Pneumothorax, Sleep Apnea, Tuberculosis Cardiovascular Patient has history of Hypertension, Peripheral Arterial Disease, Peripheral Venous Disease Denies history of Angina, Arrhythmia, Congestive Heart Failure, Coronary Artery Disease, Deep Vein Thrombosis, Hypotension, Myocardial Infarction, Phlebitis, Vasculitis Gastrointestinal Denies history of Cirrhosis , Colitis, Crohnoos, Hepatitis A, Hepatitis B, Hepatitis C Endocrine Patient has history of Type II Diabetes Genitourinary Denies history of End Stage Renal Disease Immunological Denies history of Lupus Erythematosus, Raynaudoos, Scleroderma Integumentary (Skin) Denies history of History of Burn Musculoskeletal Patient has history of Osteoarthritis - Left knee Denies history of Gout, Rheumatoid Arthritis,  Osteomyelitis Neurologic Patient has history of Neuropathy Denies history of Dementia, Quadriplegia, Paraplegia, Seizure Disorder Oncologic Denies  history of Received Chemotherapy, Received Radiation Psychiatric Denies history of Anorexia/bulimia, Confinement Anxiety Hospitalization/Surgery History - 2/9-2/13 inpatient cellulitis right leg, sepsis. 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 - 2009 DVT - 5/9-5/09/2022 inpatient . questioning reaction to trulicity. Medical A Surgical History Notes nd Respiratory Hx of Pulmonary Embolism Cardiovascular Diminished pulses in lower extremity DVT 2009 Left leg Genitourinary Chronic Kidney Disease Stage 3b AKI Renal Stones Gross Hematuria Ureterolithiasis Musculoskeletal Left knee replacement Objective Constitutional Well-nourished and well-hydrated in no acute distress. Vitals Time Taken: 8:58 AM, Height: 73 in, Weight: 231 lbs, BMI: 30.5, Temperature: 98.1 F, Pulse: 78 bpm, Respiratory Rate: 18 breaths/min, Blood Pressure: 157/81 mmHg. Respiratory normal breathing without difficulty. Psychiatric this patient is able to make decisions and demonstrates good insight into disease process. Alert and Oriented x 3. pleasant and cooperative. General Notes: Patient's wound again showed signs of complete epithelization and overall I am extremely pleased with where we stand I think that he is actually making excellent progress here. Integumentary (Hair, Skin) Wound #1 status is Healed - Epithelialized. Original cause of wound was Gradually Appeared. The date acquired was: 12/13/2021. The wound has been in treatment 11 weeks. The wound is located on the Right,Posterior Lower Leg. The wound measures 0cm length x 0cm width x 0cm depth; 0cm^2 area and 0cm^3 volume. There is Fat Layer (Subcutaneous Tissue) exposed. There is a none present amount of drainage noted. The wound margin is distinct with the outline attached to the wound base. There is no  granulation within the wound bed. There is no necrotic tissue within the wound bed. Assessment Active Problems ICD-10 Venous insufficiency (chronic) (peripheral) Lymphedema, not elsewhere classified Type 2 diabetes mellitus with other skin ulcer Non-pressure chronic ulcer of other part of right lower leg with fat layer exposed Long term (current) use of anticoagulants Personal history of other venous thrombosis and embolism Plan Discharge From [facility]: Discharge from [place] - Call if any future wound care needs. Edema Control - Lymphedema / SCD / Other: Elevate legs to the level of the heart or above for 30 minutes daily and/or when sitting, a frequency of: - 3-4 times a day throughout the day. Avoid standing for long periods of time. Patient to wear own compression stockings every day. Exercise regularly Moisturize legs daily. - every night before bed. Compression stocking or Garment 20-30 mm/Hg pressure to: - apply in the morning and remove at night. Wear for life. 1. Patient appears to be completely healed which is great news. 2. I would recommend that he continue with his compression socks I think that still good to be important at this point. We will see him back for follow-up visit as needed. Electronic Signature(s) Signed: 03/27/2022 4:17:56 PM By: Worthy Keeler PA-C Entered By: Worthy Keeler on 03/27/2022 16:17:56 -------------------------------------------------------------------------------- HxROS Details Patient Name: Date of Service: [name], Glass 03/27/2022 9:00 A M Medical Record Number: [id number] Patient Account Number: [id number] Date of Birth/Sex: Treating RN: 1956-10-28 (66 y.o. Hessie Diener Primary Care Provider: Abelino Derrick Other Clinician: Referring Provider: Treating Provider/Extender: Hedda Slade in Treatment: 11 Information Obtained From Patient Chart Eyes Medical History: Negative for: Cataracts;  Glaucoma; Optic Neuritis Ear/Nose/Mouth/Throat Medical History: Negative for: Middle ear problems Hematologic/Lymphatic Medical History: Negative for: Anemia; Hemophilia; Human Immunodeficiency Virus; Lymphedema; Sickle Cell Disease Respiratory Medical History: Negative for: Aspiration; Asthma; Chronic Obstructive Pulmonary Disease (COPD); Pneumothorax; Sleep Apnea; Tuberculosis Past Medical History Notes: Hx of Pulmonary Embolism Cardiovascular Medical History: Positive for:  Hypertension; Peripheral Arterial Disease; Peripheral Venous Disease Negative for: Angina; Arrhythmia; Congestive Heart Failure; Coronary Artery Disease; Deep Vein Thrombosis; Hypotension; Myocardial Infarction; Phlebitis; Vasculitis Past Medical History Notes: Diminished pulses in lower extremity DVT 2009 Left leg Gastrointestinal Medical History: Negative for: Cirrhosis ; Colitis; Crohns; Hepatitis A; Hepatitis B; Hepatitis C Endocrine Medical History: Positive for: Type II Diabetes Time with diabetes: 2009 Treated with: Insulin, Diet Blood sugar tested every day: Yes Tested : daily Genitourinary Medical History: Negative for: End Stage Renal Disease Past Medical History Notes: Chronic Kidney Disease Stage 3b AKI Renal Stones Gross Hematuria Ureterolithiasis Immunological Medical History: Negative for: Lupus Erythematosus; Raynauds; Scleroderma Integumentary (Skin) Medical History: Negative for: History of Burn Musculoskeletal Medical History: Positive for: Osteoarthritis - Left knee Negative for: Gout; Rheumatoid Arthritis; Osteomyelitis Past Medical History Notes: Left knee replacement Neurologic Medical History: Positive for: Neuropathy Negative for: Dementia; Quadriplegia; Paraplegia; Seizure Disorder Oncologic Medical History: Negative for: Received Chemotherapy; Received Radiation Psychiatric Medical History: Negative for: Anorexia/bulimia; Confinement  Anxiety Immunizations Pneumococcal Vaccine: Received Pneumococcal Vaccination: Yes Received Pneumococcal Vaccination On or After 60th Birthday: Yes Implantable Devices No devices added Hospitalization / Surgery History Type of Hospitalization/Surgery 2/9-2/13 inpatient cellulitis right leg, sepsis left total knee replacement 10/22 2009 DVT 5/9-5/09/2022 inpatient questioning reaction to trulicity Family and Social History Cancer: Yes - Mother; Diabetes: Yes - Mother,Father; Heart Disease: Yes - Father; Hereditary Spherocytosis: No; Hypertension: No; Kidney Disease: No; Lung Disease: No; Seizures: No; Stroke: No; Thyroid Problems: No; Tuberculosis: No; Never smoker; Marital Status - Single; Alcohol Use: Rarely - beer; Drug Use: No History; Caffeine Use: Daily - daily coffee; Financial Concerns: No; Food, Clothing or Shelter Needs: No; Support System Lacking: No; Transportation Concerns: No Electronic Signature(s) Signed: 03/27/2022 2:56:41 PM By: Deon Pilling RN, BSN Signed: 03/27/2022 5:33:59 PM By: Worthy Keeler PA-C Entered By: Deon Pilling on 03/27/2022 09:03:30 -------------------------------------------------------------------------------- SuperBill Details Patient Name: Date of Service: [name], Glass 03/27/2022 Medical Record Number: [id number] Patient Account Number: [id number] Date of Birth/Sex: Treating RN: 06-19-1956 (66 y.o. Lorette Ang, Meta.Reding Primary Care Provider: Abelino Derrick Other Clinician: Referring Provider: Treating Provider/Extender: Hedda Slade in Treatment: 11 Diagnosis Coding ICD-10 Codes Code Description I87.2 Venous insufficiency (chronic) (peripheral) I89.0 Lymphedema, not elsewhere classified E11.622 Type 2 diabetes mellitus with other skin ulcer L97.812 Non-pressure chronic ulcer of other part of right lower leg with fat layer exposed Z79.01 Long term (current) use of anticoagulants Z86.718 Personal history of other  venous thrombosis and embolism Facility Procedures CPT4 Code: [id number] Description: 99213 - WOUND CARE VISIT-LEV 3 EST PT Modifier: Quantity: 1 Physician Procedures : CPT4 Code Description Modifier [id number] - WC PHYS LEVEL 3 - EST PT ICD-10 Diagnosis Description I87.2 Venous insufficiency (chronic) (peripheral) I89.0 Lymphedema, not elsewhere classified E11.622 Type 2 diabetes mellitus with other skin ulcer  L97.812 Non-pressure chronic ulcer of other part of right lower leg with fat layer exposed Quantity: 1 Electronic Signature(s) Signed: 03/27/2022 4:18:22 PM By: Worthy Keeler PA-C Previous Signature: 03/27/2022 2:56:41 PM Version By: Deon Pilling RN, BSN Entered By: Worthy Keeler on 03/27/2022 16:18:22

## 2022-03-27 NOTE — Patient Instructions (Addendum)
Pre visit review using our clinic review tool, if applicable. No additional management support is needed unless otherwise documented below in the visit note.  Increase dose today to take 7.5 mg and then change weekly dose to take 7.5 mg daily except take 5 mg on Mondays and Fridays. Recheck in 3 week.

## 2022-03-27 NOTE — Progress Notes (Signed)
Increase dose today to take 7.5 mg and then change weekly dose to take 7.5 mg daily except take 5 mg on Mondays and Fridays. Recheck in 3 week.

## 2022-04-02 DIAGNOSIS — G4733 Obstructive sleep apnea (adult) (pediatric): Secondary | ICD-10-CM | POA: Diagnosis not present

## 2022-04-03 ENCOUNTER — Ambulatory Visit (INDEPENDENT_AMBULATORY_CARE_PROVIDER_SITE_OTHER): Payer: Medicare Other | Admitting: Internal Medicine

## 2022-04-03 ENCOUNTER — Encounter: Payer: Self-pay | Admitting: Internal Medicine

## 2022-04-03 VITALS — BP 100/70 | HR 89 | Ht 73.0 in | Wt 231.6 lb

## 2022-04-03 DIAGNOSIS — Z1211 Encounter for screening for malignant neoplasm of colon: Secondary | ICD-10-CM

## 2022-04-03 DIAGNOSIS — Z7901 Long term (current) use of anticoagulants: Secondary | ICD-10-CM

## 2022-04-03 DIAGNOSIS — Z01818 Encounter for other preprocedural examination: Secondary | ICD-10-CM | POA: Diagnosis not present

## 2022-04-03 MED ORDER — PLENVU 140 G PO SOLR: 1.0000 | Freq: Once | ORAL | 0 refills | Status: AC

## 2022-04-03 NOTE — Patient Instructions (Signed)
If you are age 68 or older, your body mass index should be between 23-30. Your Body mass index is 30.56 kg/m. If this is out of the aforementioned range listed, please consider follow up with your Primary Care Provider.  If you are age 39 or younger, your body mass index should be between 19-25. Your Body mass index is 30.56 kg/m. If this is out of the aformentioned range listed, please consider follow up with your Primary Care Provider.   ________________________________________________________  The [facility] providers would like to encourage you to use [facility] to communicate with providers for non-urgent requests or questions.  Due to long hold times on the telephone, sending your provider a message by [facility] may be a faster and more efficient way to get a response.  Please allow 48 business hours for a response.  Please remember that this is for non-urgent requests.  _______________________________________________________  Dennis Bast have been scheduled for a colonoscopy. Please follow written instructions given to you at your visit today.  Please pick up your prep supplies at the pharmacy within the next 1-3 days. If you use inhalers (even only as needed), please bring them with you on the day of your procedure.

## 2022-04-04 ENCOUNTER — Encounter: Payer: Self-pay | Admitting: Internal Medicine

## 2022-04-04 NOTE — Progress Notes (Signed)
HISTORY OF PRESENT ILLNESS:   [name] is a 65 y.o. 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  He is sent today by his primary care provider regarding routine screening colonoscopy, which he has never had.  Patient also has insulin requiring type 2 diabetes mellitus.  He relocated from the [place] area to [place] about 3 years ago.  Patient tells me that he does have occasional mild constipation or diarrhea.  This is unchanged.  No bleeding.  GI review of systems is otherwise negative.  In terms of his pulmonary embolism, he tells me that he underwent extensive work-up looking for possible underlying causes with various blood work.  None found.  Not clear why he continues on anticoagulation after these many years.  He did not have recurrence.  In any event, no family history of colon cancer.  He did come off anticoagulation therapy for his left knee replacement in October 2022.  No issues.  Review of blood work from May 2023 shows creatinine of 1.90.  CBC reveals hemoglobin 12.3.  Last INR 1.2.   REVIEW OF SYSTEMS:   All non-GI ROS negative unless otherwise stated in the HPI except for arthritis, back pain       Past Medical History:  Diagnosis Date   Arthritis     Chronic kidney disease      reduced kidney function. Stage III   Diabetes mellitus without complication (HCC)     DVT (deep venous thrombosis) (HCC)      age indeterminate LLE DVT 02/09/20   Dyspnea      On exertion   History of kidney stones     History of pulmonary embolus (PE)     Hypertension     Lipidemia     Prostate enlargement     Pulmonary embolism (HCC) 2009   Sleep apnea             Past Surgical History:  Procedure Laterality Date   CYSTOSCOPY W/ URETERAL STENT PLACEMENT Left 10/08/2020    Procedure: CYSTOSCOPY WITH  RETROGRADE PYELOGRAM/URETERAL STENT PLACEMENT;  Surgeon: Wrenn, Mayo Owczarzak, MD;  Location: [facility] OR;  Service: Urology;  Laterality: Left;   CYSTOSCOPY/URETEROSCOPY/HOLMIUM LASER/STENT PLACEMENT Right 06/07/2020    Procedure: CYSTOSCOPY/URETEROSCOPY/HOLMIUM LASER/STENT PLACEMENT;  Surgeon: Bell, Eugene D III, MD;  Location: [facility]S;  Service: Urology;  Laterality: Right;   CYSTOSCOPY/URETEROSCOPY/HOLMIUM LASER/STENT PLACEMENT Left 10/19/2020    Procedure: CYSTOSCOPY LEFT URETEROSCOPY/HOLMIUM LASER/STENT EXCHANGE;  Surgeon: Wrenn, Pablo Mathurin, MD;  Location: [facility];  Service: Urology;  Laterality: Left;   TOTAL KNEE ARTHROPLASTY Left 08/06/2021    Procedure: LEFT TOTAL KNEE ARTHROPLASTY;  Surgeon: Xu, Naiping M, MD;  Location: [facility] OR;  Service: Orthopedics;  Laterality: Left;      Social History [name]  reports that he has never smoked. He has never used smokeless tobacco. He reports current alcohol use of about 1.0 standard drink per week. He reports that he does not use drugs.   family history includes Cancer in his mother; Heart disease in his father.   No Known Allergies       PHYSICAL EXAMINATION: Vital signs: BP 100/70   Pulse 89   Ht 6' 1" (1.854 m)   Wt 231 lb 9.6 oz (105.1 kg)   SpO2 100%   BMI 30.56 kg/m   Constitutional: generally well-appearing, no acute distress   Psychiatric: alert and oriented x3, cooperative Eyes: extraocular movements intact, anicteric, conjunctiva pink Mouth: oral pharynx moist, no lesions, poor dentition Neck: supple no lymphadenopathy Cardiovascular: heart regular rate and rhythm, no murmur Lungs: clear to auscultation bilaterally Abdomen: soft, nontender, nondistended, no obvious ascites, no peritoneal signs, normal bowel sounds, no organomegaly Rectal: Deferred to colonoscopy Extremities: no clubbing, cyanosis, or lower extremity edema bilaterally Skin: no lesions on visible extremities Neuro: No focal deficits.  Cranial nerves intact   ASSESSMENT:   1.   Colon cancer screening.  Baseline risk 2.  Remote history of DVT with pulmonary embolism on chronic Coumadin.  Next, anticoagulation in the past for surgery. 3.  Insulin requiring diabetes, type II. 4.  Poor dentition     PLAN:   1.  Colonoscopy.  The patient is high risk given his comorbidities and chronic anticoagulation.The nature of the procedure, as well as the risks, benefits, and alternatives were carefully and thoroughly reviewed with the patient. Ample time for discussion and questions allowed. The patient understood, was satisfied, and agreed to proceed.  2.  Hold Coumadin 5 days prior to the procedure.  The pros and cons of interrupting anticoagulation reviewed.  He would seem to be at low risk for recurrent DVT given his history. 3.  Hold a.m. insulin the morning of the procedure to avoid unwanted hypoglycemia. 4.  Ongoing general medical care with PCP       

## 2022-04-10 ENCOUNTER — Ambulatory Visit (INDEPENDENT_AMBULATORY_CARE_PROVIDER_SITE_OTHER): Payer: Medicare Other

## 2022-04-10 DIAGNOSIS — Z0000 Encounter for general adult medical examination without abnormal findings: Secondary | ICD-10-CM

## 2022-04-10 NOTE — Patient Instructions (Signed)
[name] , Thank you for taking time to come for your Medicare Wellness Visit. I appreciate your ongoing commitment to your health goals. Please review the following plan we discussed and let me know if I can assist you in the future.   Screening recommendations/referrals: Colonoscopy: Scheduled 05/21/2022 Recommended yearly ophthalmology/optometry visit for glaucoma screening and checkup Recommended yearly dental visit for hygiene and checkup  Vaccinations: Influenza vaccine: completed  Pneumococcal vaccine: completed  Tdap vaccine: due  Shingles vaccine: will consider     Advanced directives: none   Conditions/risks identified: none   Next appointment: none   Preventive Care 42 Years and Older, Male Preventive care refers to lifestyle choices and visits with your health care provider that can promote health and wellness. What does preventive care include? A yearly physical exam. This is also called an annual well check. Dental exams once or twice a year. Routine eye exams. Ask your health care provider how often you should have your eyes checked. Personal lifestyle choices, including: Daily care of your teeth and gums. Regular physical activity. Eating a healthy diet. Avoiding tobacco and drug use. Limiting alcohol use. Practicing safe sex. Taking low doses of aspirin every day. Taking vitamin and mineral supplements as recommended by your health care provider. What happens during an annual well check? The services and screenings done by your health care provider during your annual well check will depend on your age, overall health, lifestyle risk factors, and family history of disease. Counseling  Your health care provider may ask you questions about your: Alcohol use. Tobacco use. Drug use. Emotional well-being. Home and relationship well-being. Sexual activity. Eating habits. History of falls. Memory and ability to understand (cognition). Work and work  Statistician. Screening  You may have the following tests or measurements: Height, weight, and BMI. Blood pressure. Lipid and cholesterol levels. These may be checked every 5 years, or more frequently if you are over 26 years old. Skin check. Lung cancer screening. You may have this screening every year starting at age 71 if you have a 30-pack-year history of smoking and currently smoke or have quit within the past 15 years. Fecal occult blood test (FOBT) of the stool. You may have this test every year starting at age 64. Flexible sigmoidoscopy or colonoscopy. You may have a sigmoidoscopy every 5 years or a colonoscopy every 10 years starting at age 55. Prostate cancer screening. Recommendations will vary depending on your family history and other risks. Hepatitis C blood test. Hepatitis B blood test. Sexually transmitted disease (STD) testing. Diabetes screening. This is done by checking your blood sugar (glucose) after you have not eaten for a while (fasting). You may have this done every 1-3 years. Abdominal aortic aneurysm (AAA) screening. You may need this if you are a current or former smoker. Osteoporosis. You may be screened starting at age 56 if you are at high risk. Talk with your health care provider about your test results, treatment options, and if necessary, the need for more tests. Vaccines  Your health care provider may recommend certain vaccines, such as: Influenza vaccine. This is recommended every year. Tetanus, diphtheria, and acellular pertussis (Tdap, Td) vaccine. You may need a Td booster every 10 years. Zoster vaccine. You may need this after age 67. Pneumococcal 13-valent conjugate (PCV13) vaccine. One dose is recommended after age 71. Pneumococcal polysaccharide (PPSV23) vaccine. One dose is recommended after age 53. Talk to your health care provider about which screenings and vaccines you need and how  often you need them. This information is not intended to replace  advice given to you by your health care provider. Make sure you discuss any questions you have with your health care provider. Document Released: 11/17/2015 Document Revised: 07/10/2016 Document Reviewed: 08/22/2015 Elsevier Interactive Patient Education  2017 [place] Prevention in the Home Falls can cause injuries. They can happen to people of all ages. There are many things you can do to make your home safe and to help prevent falls. What can I do on the outside of my home? Regularly fix the edges of walkways and driveways and fix any cracks. Remove anything that might make you trip as you walk through a door, such as a raised step or threshold. Trim any bushes or trees on the path to your home. Use bright outdoor lighting. Clear any walking paths of anything that might make someone trip, such as rocks or tools. Regularly check to see if handrails are loose or broken. Make sure that both sides of any steps have handrails. Any raised decks and porches should have guardrails on the edges. Have any leaves, snow, or ice cleared regularly. Use sand or salt on walking paths during winter. Clean up any spills in your garage right away. This includes oil or grease spills. What can I do in the bathroom? Use night lights. Install grab bars by the toilet and in the tub and shower. Do not use towel bars as grab bars. Use non-skid mats or decals in the tub or shower. If you need to sit down in the shower, use a plastic, non-slip stool. Keep the floor dry. Clean up any water that spills on the floor as soon as it happens. Remove soap buildup in the tub or shower regularly. Attach bath mats securely with double-sided non-slip rug tape. Do not have throw rugs and other things on the floor that can make you trip. What can I do in the bedroom? Use night lights. Make sure that you have a light by your bed that is easy to reach. Do not use any sheets or blankets that are too big for your bed.  They should not hang down onto the floor. Have a firm chair that has side arms. You can use this for support while you get dressed. Do not have throw rugs and other things on the floor that can make you trip. What can I do in the kitchen? Clean up any spills right away. Avoid walking on wet floors. Keep items that you use a lot in easy-to-reach places. If you need to reach something above you, use a strong step stool that has a grab bar. Keep electrical cords out of the way. Do not use floor polish or wax that makes floors slippery. If you must use wax, use non-skid floor wax. Do not have throw rugs and other things on the floor that can make you trip. What can I do with my stairs? Do not leave any items on the stairs. Make sure that there are handrails on both sides of the stairs and use them. Fix handrails that are broken or loose. Make sure that handrails are as long as the stairways. Check any carpeting to make sure that it is firmly attached to the stairs. Fix any carpet that is loose or worn. Avoid having throw rugs at the top or bottom of the stairs. If you do have throw rugs, attach them to the floor with carpet tape. Make sure that you have a  light switch at the top of the stairs and the bottom of the stairs. If you do not have them, ask someone to add them for you. What else can I do to help prevent falls? Wear shoes that: Do not have high heels. Have rubber bottoms. Are comfortable and fit you well. Are closed at the toe. Do not wear sandals. If you use a stepladder: Make sure that it is fully opened. Do not climb a closed stepladder. Make sure that both sides of the stepladder are locked into place. Ask someone to hold it for you, if possible. Clearly mark and make sure that you can see: Any grab bars or handrails. First and last steps. Where the edge of each step is. Use tools that help you move around (mobility aids) if they are needed. These  include: Canes. Walkers. Scooters. Crutches. Turn on the lights when you go into a dark area. Replace any light bulbs as soon as they burn out. Set up your furniture so you have a clear path. Avoid moving your furniture around. If any of your floors are uneven, fix them. If there are any pets around you, be aware of where they are. Review your medicines with your doctor. Some medicines can make you feel dizzy. This can increase your chance of falling. Ask your doctor what other things that you can do to help prevent falls. This information is not intended to replace advice given to you by your health care provider. Make sure you discuss any questions you have with your health care provider. Document Released: 08/17/2009 Document Revised: 03/28/2016 Document Reviewed: 11/25/2014 Elsevier Interactive Patient Education  2017 [employer].

## 2022-04-10 NOTE — Progress Notes (Signed)
Subjective:   [name] is a 66 y.o. male who presents for an Initial Medicare Annual Wellness Visit.  I connected with [name] today by telephone and verified that I am speaking with the correct person using two identifiers. Location patient: home Location provider: work Persons participating in the virtual visit: patient, provider.   I discussed the limitations, risks, security and privacy concerns of performing an evaluation and management service by telephone and the availability of in person appointments. I also discussed with the patient that there may be a patient responsible charge related to this service. The patient expressed understanding and verbally consented to this telephonic visit.    Interactive audio and video telecommunications were attempted between this provider and patient, however failed, due to patient having technical difficulties OR patient did not have access to video capability.  We continued and completed visit with audio only.    Review of Systems     Cardiac Risk Factors include: advanced age (>31mn, >>36women);diabetes mellitus;male gender;dyslipidemia     Objective:    Today's Vitals   There is no height or weight on file to calculate BMI.     04/10/2022   11:14 AM 03/12/2022    2:13 PM 12/07/2021    8:22 PM 12/06/2021   11:12 PM 09/11/2021    8:09 AM 08/06/2021    8:18 AM 08/02/2021    1:19 PM  Advanced Directives  Does Patient Have a Medical Advance Directive? No No No No No No No  Does patient want to make changes to medical advance directive?      No - Patient declined No - Patient declined  Would patient like information on creating a medical advance directive? No - Patient declined No - Patient declined No - Patient declined No - Patient declined Yes (MAU/Ambulatory/Procedural Areas - Information given) No - Patient declined     Current Medications (verified) Outpatient Encounter Medications as of 04/10/2022  Medication Sig   ACCU-CHEK  GUIDE test strip USE TO CHECK BLOOD SUGAR 3 TIMES DAILY.E11.65   Accu-Chek Softclix Lancets lancets Use as instructed to check blood sugar 3 times daily Dx is E11.65   acetaminophen (TYLENOL) 650 MG CR tablet Take 1,300 mg by mouth 2 (two) times daily.   Blood Glucose Monitoring Suppl (ACCU-CHEK AVIVA) device by Other route. Use as instructed   Continuous Blood Gluc Sensor (DEXCOM G6 SENSOR) MISC 1 Device by Does not apply route as directed.   Continuous Blood Gluc Transmit (DEXCOM G6 TRANSMITTER) MISC 1 Device by Does not apply route as directed.   gabapentin (NEURONTIN) 300 MG capsule Take 2 capsules (600 mg total) by mouth 2 (two) times daily. (Patient taking differently: Take 300 mg by mouth 4 (four) times daily.)   insulin glargine (LANTUS) 100 UNIT/ML injection Inject 0.2 mLs (20 Units total) into the skin daily.   insulin lispro (HUMALOG KWIKPEN) 100 UNIT/ML KwikPen Contact your endocrinologist and/or use a sliding scale that you have used before-to be clear this is a short acting insulin and she must of given you instructions with regards to its use   Insulin Pen Needle 32G X 4 MM MISC 1 Device by Does not apply route 3 (three) times daily.   Insulin Syringe-Needle U-100 (INSULIN SYRINGES) 31G X 5/16" 0.5 ML MISC 1 Device by Does not apply route daily in the afternoon.   Insulin Syringes, Disposable, U-100 1 ML MISC 1 Device by Does not apply route as directed.   losartan (COZAAR) 50 MG  tablet Take 50 mg by mouth daily.   lovastatin (MEVACOR) 40 MG tablet Take 1 tablet (40 mg total) by mouth at bedtime.   warfarin (COUMADIN) 5 MG tablet Take 5-7.5 mg by mouth See admin instructions. Taking 5 mg on Mon, Tues, Wed, Friday and Sunday, taking 7.5 mg on Thursday and Saturday.   No facility-administered encounter medications on file as of 04/10/2022.    Allergies (verified) Patient has no known allergies.   History: Past Medical History:  Diagnosis Date   Arthritis    Chronic kidney disease     reduced kidney function. Stage III   Diabetes mellitus without complication (HCC)    DVT (deep venous thrombosis) [facility])    age indeterminate LLE DVT 02/09/20   Dyspnea    On exertion   History of kidney stones    History of pulmonary embolus (PE)    Hypertension    Lipidemia    Prostate enlargement    Pulmonary embolism ([place]) 2009   Sleep apnea    Past Surgical History:  Procedure Laterality Date   CYSTOSCOPY W/ URETERAL STENT PLACEMENT Left 10/08/2020   Procedure: CYSTOSCOPY WITH RETROGRADE PYELOGRAM/URETERAL STENT PLACEMENT;  Surgeon: Irine Seal, MD;  Location: [facility];  Service: Urology;  Laterality: Left;   CYSTOSCOPY/URETEROSCOPY/HOLMIUM LASER/STENT PLACEMENT Right 06/07/2020   Procedure: CYSTOSCOPY/URETEROSCOPY/HOLMIUM LASER/STENT PLACEMENT;  Surgeon: Lucas Mallow, MD;  Location: [facility];  Service: Urology;  Laterality: Right;   CYSTOSCOPY/URETEROSCOPY/HOLMIUM LASER/STENT PLACEMENT Left 10/19/2020   Procedure: CYSTOSCOPY LEFT URETEROSCOPY/HOLMIUM LASER/STENT EXCHANGE;  Surgeon: Irine Seal, MD;  Location: [facility];  Service: Urology;  Laterality: Left;   TOTAL KNEE ARTHROPLASTY Left 08/06/2021   Procedure: LEFT TOTAL KNEE ARTHROPLASTY;  Surgeon: Leandrew Koyanagi, MD;  Location: [facility];  Service: Orthopedics;  Laterality: Left;   Family History  Problem Relation Age of Onset   Cancer Mother    Heart disease Father    Social History   Socioeconomic History   Marital status: Significant Other    Spouse name: Not on file   Number of children: Not on file   Years of education: Not on file   Highest education level: Not on file  Occupational History   Occupation: retired  Tobacco Use   Smoking status: Never   Smokeless tobacco: Never  Vaping Use   Vaping Use: Never used  Substance and Sexual Activity   Alcohol use: Yes    Alcohol/week: 1.0 standard drink    Types: 1 Cans of beer per week    Comment: social   Drug use: Never   Sexual activity: Yes  Other Topics Concern    Not on file  Social History Narrative   Lives with Friend   Right handed   Drinks 1-2 cups caffeine daily   Social Determinants of Health   Financial Resource Strain: Low Risk    Difficulty of Paying Living Expenses: Not hard at all  Food Insecurity: No Food Insecurity   Worried About Charity fundraiser in the Last Year: Never true   Ran Out of Food in the Last Year: Never true  Transportation Needs: No Transportation Needs   Lack of Transportation (Medical): No   Lack of Transportation (Non-Medical): No  Physical Activity: Insufficiently Active   Days of Exercise per Week: 2 days   Minutes of Exercise per Session: 20 min  Stress: No Stress Concern Present   Feeling of Stress : Not at all  Social Connections: Moderately Isolated   Frequency of Communication with  Friends and Family: Three times a week   Frequency of Social Gatherings with Friends and Family: Three times a week   Attends Religious Services: 1 to 4 times per year   Active Member of Clubs or Organizations: No   Attends Archivist Meetings: Never   Marital Status: Separated    Tobacco Counseling Counseling given: Not Answered   Clinical Intake:  Pre-visit preparation completed: Yes  Pain : No/denies pain     Nutritional Risks: None Diabetes: Yes CBG done?: No Did pt. bring in CBG monitor from home?: No  How often do you need to have someone help you when you read instructions, pamphlets, or other written materials from your doctor or pharmacy?: 1 - Never What is the last grade level you completed in school?: college  Diabetic?yes Nutrition Risk Assessment:  Has the patient had any N/V/D within the last 2 months?  No  Does the patient have any non-healing wounds?  No  Has the patient had any unintentional weight loss or weight gain?  No   Diabetes:  Is the patient diabetic?  Yes  If diabetic, was a CBG obtained today?  No  Did the patient bring in their glucometer from home?  No   How often do you monitor your CBG's? 3 x day .   Financial Strains and Diabetes Management:  Are you having any financial strains with the device, your supplies or your medication? No .  Does the patient want to be seen by Chronic Care Management for management of their diabetes?  No  Would the patient like to be referred to a Nutritionist or for Diabetic Management?  No   Diabetic Exams:  Diabetic Eye Exam: Completed 05/2022 Diabetic Foot Exam: Overdue, Pt has been advised about the importance in completing this exam. Pt is scheduled for diabetic foot exam on next office visit .   Interpreter Needed?: No  Information entered by :: L.Simranjit Thayer,LPn   Activities of Daily Living    04/10/2022   11:18 AM 03/12/2022    2:15 PM  In your present state of health, do you have any difficulty performing the following activities:  Hearing? 0   Vision? 0   Difficulty concentrating or making decisions? 0   Walking or climbing stairs? 0   Dressing or bathing? 0   Doing errands, shopping? 0 0  Preparing Food and eating ? N   Using the Toilet? N   In the past six months, have you accidently leaked urine? N   Do you have problems with loss of bowel control? N   Managing your Medications? N   Managing your Finances? N   Housekeeping or managing your Housekeeping? N     Patient Care Team: [name], MD as PCP - General (Family Medicine) Elouise Munroe, MD as PCP - Cardiology (Cardiology)  Indicate any recent Medical Services you may have received from other than [facility] providers in the past year (date may be approximate).     Assessment:   This is a routine wellness examination for [name].  Hearing/Vision screen Vision Screening - Comments:: Annual eye exams wear glasses   Dietary issues and exercise activities discussed: Current Exercise Habits: Home exercise routine, Type of exercise: walking, Time (Minutes): 20, Frequency (Times/Week): 2, Weekly Exercise (Minutes/Week):  40, Intensity: Mild, Exercise limited by: orthopedic condition(s)   Goals Addressed   None    Depression Screen    04/10/2022   11:12 AM 01/11/2022    9:28 AM  12/19/2021   11:38 AM 07/10/2021   10:41 AM 08/28/2020    1:46 PM 12/17/2019    2:16 PM  PHQ 2/9 Scores  PHQ - 2 Score 0 0 0 0 0 0    Fall Risk    04/10/2022   11:14 AM 01/11/2022    9:28 AM 12/19/2021   11:38 AM 07/10/2021   10:41 AM 08/28/2020    1:46 PM  Fall Risk   Falls in the past year? 0  0 0 0  Number falls in past yr: 0 0 0    Injury with Fall? 0 0     Follow up Falls evaluation completed        FALL RISK PREVENTION PERTAINING TO THE HOME:  Any stairs in or around the home? Yes  If so, are there any without handrails? No  Home free of loose throw rugs in walkways, pet beds, electrical cords, etc? Yes  Adequate lighting in your home to reduce risk of falls? Yes   ASSISTIVE DEVICES UTILIZED TO PREVENT FALLS:  Life alert? No  Use of a cane, walker or w/c? No  Grab bars in the bathroom? No  Shower chair or bench in shower? No  Elevated toilet seat or a handicapped toilet? No    Cognitive Function:  Normal cognitive status assessed by telephone conversation  by this Nurse Health Advisor. No abnormalities found.        Immunizations Immunization History  Administered Date(s) Administered   Fluad Quad(high Dose 65+) 12/28/2021   Influenza Whole 10/05/2019, 07/28/2020   PFIZER(Purple Top)SARS-COV-2 Vaccination 01/08/2020, 01/29/2020   PNEUMOCOCCAL CONJUGATE-20 12/28/2021   Pneumococcal Polysaccharide-23 06/08/2020    TDAP status: Due, Education has been provided regarding the importance of this vaccine. Advised may receive this vaccine at local pharmacy or Health Dept. Aware to provide a copy of the vaccination record if obtained from local pharmacy or Health Dept. Verbalized acceptance and understanding.  Flu Vaccine status: Up to date  Pneumococcal vaccine status: Up to date  Covid-19 vaccine status:  Completed vaccines  Qualifies for Shingles Vaccine? Yes   Zostavax completed No   Shingrix Completed?: No.    Education has been provided regarding the importance of this vaccine. Patient has been advised to call insurance company to determine out of pocket expense if they have not yet received this vaccine. Advised may also receive vaccine at local pharmacy or Health Dept. Verbalized acceptance and understanding.  Screening Tests Health Maintenance  Topic Date Due   Hepatitis C Screening  Never done   URINE MICROALBUMIN  08/28/2021   TETANUS/TDAP  04/17/2022 (Originally 05/30/1975)   COLONOSCOPY (Pts 45-24yr Insurance coverage will need to be confirmed)  04/18/2022 (Originally 05/31/1999)   FOOT EXAM  07/14/2022 (Originally 02/22/2021)   Zoster Vaccines- Shingrix (1 of 2) 04/16/2023 (Originally 05/30/2004)   OPHTHALMOLOGY EXAM  05/16/2022   INFLUENZA VACCINE  06/04/2022   HEMOGLOBIN A1C  09/05/2022   Pneumonia Vaccine 65 Years old  Completed   HPV VACCINES  Aged Out   COVID-19 Vaccine  Discontinued    Health Maintenance  Health Maintenance Due  Topic Date Due   Hepatitis C Screening  Never done   URINE MICROALBUMIN  08/28/2021    Colorectal cancer screening: Referral to GI placed scheduled 05/21/2022. Pt aware the office will call re: appt.  Lung Cancer Screening: (Low Dose CT Chest recommended if Age 68-80years, 30 pack-year currently smoking OR have quit w/in 15years.) does not qualify.   Lung Cancer Screening  Referral: n/a  Additional Screening:  Hepatitis C Screening: does not qualify;   Vision Screening: Recommended annual ophthalmology exams for early detection of glaucoma and other disorders of the eye. Is the patient up to date with their annual eye exam?  Yes  Who is the provider or what is the name of the office in which the patient attends annual eye exams? [facility]  If pt is not established with a provider, would they like to be referred to a  provider to establish care? No .   Dental Screening: Recommended annual dental exams for proper oral hygiene  Community Resource Referral / Chronic Care Management: CRR required this visit?  No   CCM required this visit?  No      Plan:     I have personally reviewed and noted the following in the patient's chart:   Medical and social history Use of alcohol, tobacco or illicit drugs  Current medications and supplements including opioid prescriptions. Patient is not currently taking opioid prescriptions. Functional ability and status Nutritional status Physical activity Advanced directives List of other physicians Hospitalizations, surgeries, and ER visits in previous 12 months Vitals Screenings to include cognitive, depression, and falls Referrals and appointments  In addition, I have reviewed and discussed with patient certain preventive protocols, quality metrics, and best practice recommendations. A written personalized care plan for preventive services as well as general preventive health recommendations were provided to patient.     Randel Pigg, LPN   01/10/4664   Nurse Notes: none

## 2022-04-12 DIAGNOSIS — G4733 Obstructive sleep apnea (adult) (pediatric): Secondary | ICD-10-CM | POA: Diagnosis not present

## 2022-04-16 NOTE — Progress Notes (Signed)
SCHRACK, [name] ([id number]) Visit Report for 03/06/2022 Arrival Information Details Patient Name: Date of Service: [name] 03/06/2022 8:15 A M Medical Record Number: [id number] Patient Account Number: [id number] Date of Birth/Sex: Treating RN: 08-02-1956 (66 y.o. Lorette Ang, Tammi Klippel Primary Care Nathanael Krist: Abelino Derrick Other Clinician: Referring Abdoulie Tierce: Treating Gilmar Bua/Extender: Hedda Slade in Treatment: 8 Visit Information History Since Last Visit Added or deleted any medications: No Patient Arrived: [name] Any new allergies or adverse reactions: No Arrival Time: 08:06 Had a fall or experienced change in No Accompanied By: self activities of daily living that may affect Transfer Assistance: None risk of falls: Patient Identification Verified: Yes Signs or symptoms of abuse/neglect since last visito No Secondary Verification Process Completed: Yes Hospitalized since last visit: No Patient Requires Transmission-Based Precautions: No Implantable device outside of the clinic excluding No Patient Has Alerts: Yes cellular tissue based products placed in the center Patient Alerts: Patient on Blood Thinner since last visit: Has Dressing in Place as Prescribed: Yes Has Compression in Place as Prescribed: Yes Pain Present Now: No Electronic Signature(s) Signed: 04/16/2022 8:46:54 AM By: Erenest Blank Entered By: Erenest Blank on 03/06/2022 08:07:59 -------------------------------------------------------------------------------- Compression Therapy Details Patient Name: Date of Service: [name], [name] 03/06/2022 8:15 A M Medical Record Number: [id number] Patient Account Number: [id number] Date of Birth/Sex: Treating RN: Apr 03, 1956 (66 y.o. Mare Ferrari Primary Care Makaiah Terwilliger: Abelino Derrick Other Clinician: Referring Nowell Sites: Treating Blake Goya/Extender: Hedda Slade in Treatment: 8 Compression Therapy Performed for Wound  Assessment: Wound #1 Right,Posterior Lower Leg Performed By: Clinician Sharyn Creamer, RN Compression Type: Three Layer Post Procedure Diagnosis Same as Pre-procedure Electronic Signature(s) Signed: 03/12/2022 8:24:25 AM By: Sharyn Creamer RN, BSN Entered By: Sharyn Creamer on 03/06/2022 08:33:54 -------------------------------------------------------------------------------- Encounter Discharge Information Details Patient Name: Date of Service: [name] 03/06/2022 8:15 A M Medical Record Number: [id number] Patient Account Number: [id number] Date of Birth/Sex: Treating RN: 1956-08-25 (66 y.o. Mare Ferrari Primary Care Dorman Calderwood: Abelino Derrick Other Clinician: Referring Orra Nolde: Treating Kharon Hixon/Extender: Hedda Slade in Treatment: 8 Encounter Discharge Information Items Discharge Condition: Stable Ambulatory Status: Cane Discharge Destination: Home Transportation: Private Auto Accompanied By: self Schedule Follow-up Appointment: Yes Clinical Summary of Care: Patient Declined Electronic Signature(s) Signed: 03/12/2022 8:24:25 AM By: Sharyn Creamer RN, BSN Entered By: Sharyn Creamer on 03/06/2022 08:47:58 -------------------------------------------------------------------------------- Lower Extremity Assessment Details Patient Name: Date of Service: [name] 03/06/2022 8:15 A M Medical Record Number: [id number] Patient Account Number: [id number] Date of Birth/Sex: Treating RN: Aug 26, 1956 (66 y.o. Hessie Diener Primary Care Girlie Veltri: Abelino Derrick Other Clinician: Referring Chihiro Frey: Treating Martena Emanuele/Extender: Hedda Slade in Treatment: 8 Edema Assessment Assessed: [Left: No] [Right: No] Edema: [Left: N] [Right: o] Calf Left: Right: Point of Measurement: 39 cm From Medial Instep 36.5 cm Ankle Left: Right: Point of Measurement: 15 cm From Medial Instep 24.6 cm Vascular Assessment Pulses: Dorsalis  Pedis Palpable: [Right:Yes] Electronic Signature(s) Signed: 03/07/2022 5:16:25 PM By: Deon Pilling RN, BSN Signed: 04/16/2022 8:46:54 AM By: Erenest Blank Entered By: Erenest Blank on 03/06/2022 08:13:25 -------------------------------------------------------------------------------- Multi-Disciplinary Care Plan Details Patient Name: Date of Service: [name] 03/06/2022 8:15 A M Medical Record Number: [id number] Patient Account Number: [id number] Date of Birth/Sex: Treating RN: Oct 02, 1956 (66 y.o. Mare Ferrari Primary Care Radames Mejorado: Abelino Derrick Other Clinician: Referring Claribel Sachs: Treating Ayris Carano/Extender: Hedda Slade in Treatment: 8 Active Inactive Nutrition Nursing Diagnoses: Potential for alteratiion in Nutrition/Potential for imbalanced nutrition Goals: Patient/caregiver agrees to  and verbalizes understanding of need to obtain nutritional consultation Date Initiated: 01/09/2022 T arget Resolution Date: 03/14/2022 Goal Status: Active Interventions: Assess HgA1c results as ordered upon admission and as needed Provide education on elevated blood sugars and impact on wound healing Provide education on nutrition Treatment Activities: Education provided on Nutrition : 03/06/2022 Obtain HgA1c : 01/09/2022 Notes: Pain, Acute or Chronic Nursing Diagnoses: Pain, acute or chronic: actual or potential Potential alteration in comfort, pain Goals: Patient will verbalize adequate pain control and receive pain control interventions during procedures as needed Date Initiated: 01/09/2022 Target Resolution Date: 03/13/2022 Goal Status: Active Patient/caregiver will verbalize comfort level met Date Initiated: 01/09/2022 Target Resolution Date: 03/13/2022 Goal Status: Active Interventions: Encourage patient to take pain medications as prescribed Provide education on pain management Reposition patient for comfort Treatment Activities: Administer pain  control measures as ordered : 01/09/2022 Notes: Electronic Signature(s) Signed: 03/12/2022 8:24:25 AM By: Sharyn Creamer RN, BSN Entered By: Sharyn Creamer on 03/06/2022 08:25:42 -------------------------------------------------------------------------------- Pain Assessment Details Patient Name: Date of Service: THONG, FEENY 03/06/2022 8:15 A M Medical Record Number: [id number] Patient Account Number: [id number] Date of Birth/Sex: Treating RN: 12/08/1955 (66 y.o. Hessie Diener Primary Care Rodina Pinales: Abelino Derrick Other Clinician: Referring Gerilynn Mccullars: Treating Shanae Luo/Extender: Hedda Slade in Treatment: 8 Active Problems Location of Pain Severity and Description of Pain Patient Has Paino No Site Locations Pain Management and Medication Current Pain Management: Electronic Signature(s) Signed: 03/07/2022 5:16:25 PM By: Deon Pilling RN, BSN Signed: 04/16/2022 8:46:54 AM By: Erenest Blank Entered By: Erenest Blank on 03/06/2022 08:09:00 -------------------------------------------------------------------------------- Patient/Caregiver Education Details Patient Name: Date of Service: [name] 5/3/2023andnbsp8:15 [place] Record Number: [id number] Patient Account Number: [id number] Date of Birth/Gender: Treating RN: [birth year] (66 y.o. Mare Ferrari Primary Care Physician: Abelino Derrick Other Clinician: Referring Physician: Treating Physician/Extender: Hedda Slade in Treatment: 8 Education Assessment Education Provided To: Patient Education Topics Provided Elevated Blood Sugar/ Impact on Healing: Methods: Explain/Verbal Responses: State content correctly Nutrition: Methods: Explain/Verbal Responses: State content correctly Wound/Skin Impairment: Methods: Explain/Verbal Responses: State content correctly Electronic Signature(s) Signed: 03/12/2022 8:24:25 AM By: Sharyn Creamer RN, BSN Entered By: Sharyn Creamer on 03/06/2022 08:20:10 -------------------------------------------------------------------------------- Wound Assessment Details Patient Name: Date of Service: NAJIR, ROOP 03/06/2022 8:15 A M Medical Record Number: [id number] Patient Account Number: [id number] Date of Birth/Sex: Treating RN: July 02, 1956 (66 y.o. Lorette Ang, Meta.Reding Primary Care Dea Bitting: Abelino Derrick Other Clinician: Referring Emmons Toth: Treating Vyom Brass/Extender: Hedda Slade in Treatment: 8 Wound Status Wound Number: 1 Primary Venous Leg Ulcer Etiology: Wound Location: Right, Posterior Lower Leg Wound Open Wounding Event: Gradually Appeared Status: Date Acquired: 12/13/2021 Comorbid Hypertension, Peripheral Arterial Disease, Peripheral Venous Weeks Of Treatment: 8 History: Disease, Type II Diabetes, Osteoarthritis, Neuropathy Clustered Wound: Yes Photos Wound Measurements Length: (cm) 0.1 Width: (cm) 0.1 Depth: (cm) 0.1 Clustered Quantity: 1 Area: (cm) 0.008 Volume: (cm) 0.001 % Reduction in Area: 100% % Reduction in Volume: 100% Epithelialization: Large (67-100%) Tunneling: No Undermining: No Wound Description Classification: Full Thickness Without Exposed Support Structures Wound Margin: Distinct, outline attached Exudate Amount: None Present Foul Odor After Cleansing: No Slough/Fibrino No Wound Bed Granulation Amount: None Present (0%) Exposed Structure Necrotic Amount: None Present (0%) Fascia Exposed: No Fat Layer (Subcutaneous Tissue) Exposed: Yes Tendon Exposed: No Muscle Exposed: No Joint Exposed: No Bone Exposed: No Electronic Signature(s) Signed: 03/07/2022 5:16:25 PM By: Deon Pilling RN, BSN Signed: 03/12/2022 8:24:25 AM By: Sharyn Creamer RN, BSN Entered By: Sharyn Creamer  on 03/06/2022 08:16:03 -------------------------------------------------------------------------------- Vitals Details Patient Name: Date of Service: DAWAN, FARNEY 03/06/2022 [place] Record Number: [id number] Patient Account Number: [id number] Date of Birth/Sex: Treating RN: January 19, 1956 (66 y.o. Lorette Ang, Meta.Reding Primary Care Amiyrah Lamere: Abelino Derrick Other Clinician: Referring Suzetta Timko: Treating Lindalou Soltis/Extender: Hedda Slade in Treatment: 8 Vital Signs Time Taken: 08:08 Temperature (F): 97.9 Height (in): 73 Pulse (bpm): 66 Weight (lbs): 231 Respiratory Rate (breaths/min): 18 Body Mass Index (BMI): 30.5 Blood Pressure (mmHg): 169/80 Reference Range: 80 - 120 mg / dl Electronic Signature(s) Signed: 04/16/2022 8:46:54 AM By: Erenest Blank Entered By: Erenest Blank on 03/06/2022 08:08:53

## 2022-04-16 NOTE — Progress Notes (Signed)
HARRIOTT, Avir ([id number]) Visit Report for 03/27/2022 Arrival Information Details Patient Name: Date of Service: KELL, FERRIS 03/27/2022 9:00 A M Medical Record Number: [id number] Patient Account Number: [id number] Date of Birth/Sex: Treating RN: Jun 26, 1956 (66 y.o. Lorette Ang, Meta.Reding Primary Care Larren Copes: Abelino Derrick Other Clinician: Referring Betul Brisky: Treating Fatoumata Albaugh/Extender: Hedda Slade in Treatment: 11 Visit Information History Since Last Visit Added or deleted any medications: No Patient Arrived: Ambulatory Any new allergies or adverse reactions: No Arrival Time: 08:54 Had a fall or experienced change in No Accompanied By: self activities of daily living that may affect Transfer Assistance: None risk of falls: Patient Identification Verified: Yes Signs or symptoms of abuse/neglect since last visito No Secondary Verification Process Completed: Yes Hospitalized since last visit: Yes Patient Requires Transmission-Based Precautions: No Implantable device outside of the clinic excluding No Patient Has Alerts: Yes cellular tissue based products placed in the center Patient Alerts: Patient on Blood Thinner since last visit: Has Dressing in Place as Prescribed: Yes Pain Present Now: No Electronic Signature(s) Signed: 04/16/2022 8:46:54 AM By: Erenest Blank Entered By: Erenest Blank on 03/27/2022 08:56:31 -------------------------------------------------------------------------------- Clinic Level of Care Assessment Details Patient Name: Date of Service: JANARI, YAMADA 03/27/2022 9:00 A M Medical Record Number: [id number] Patient Account Number: [id number] Date of Birth/Sex: Treating RN: 1956/02/04 (66 y.o. Lorette Ang, Meta.Reding Primary Care Virgilio Broadhead: Abelino Derrick Other Clinician: Referring Kris No: Treating Drue Camera/Extender: Hedda Slade in Treatment: 11 Clinic Level of Care Assessment Items TOOL 4 Quantity Score X- 1  0 Use when only an EandM is performed on FOLLOW-UP visit ASSESSMENTS - Nursing Assessment / Reassessment X- 1 10 Reassessment of Co-morbidities (includes updates in patient status) X- 1 5 Reassessment of Adherence to Treatment Plan ASSESSMENTS - Wound and Skin A ssessment / Reassessment X - Simple Wound Assessment / Reassessment - one wound 1 5 '[]'$  - 0 Complex Wound Assessment / Reassessment - multiple wounds X- 1 10 Dermatologic / Skin Assessment (not related to wound area) ASSESSMENTS - Focused Assessment X- 1 5 Circumferential Edema Measurements - multi extremities '[]'$  - 0 Nutritional Assessment / Counseling / Intervention '[]'$  - 0 Lower Extremity Assessment (monofilament, tuning fork, pulses) '[]'$  - 0 Peripheral Arterial Disease Assessment (using hand held doppler) ASSESSMENTS - Ostomy and/or Continence Assessment and Care '[]'$  - 0 Incontinence Assessment and Management '[]'$  - 0 Ostomy Care Assessment and Management (repouching, etc.) PROCESS - Coordination of Care X - Simple Patient / Family Education for ongoing care 1 15 '[]'$  - 0 Complex (extensive) Patient / Family Education for ongoing care X- 1 10 Staff obtains Consents, Records, T Results / Process Orders est '[]'$  - 0 Staff telephones HHA, Nursing Homes / Clarify orders / etc '[]'$  - 0 Routine Transfer to another Facility (non-emergent condition) '[]'$  - 0 Routine Hospital Admission (non-emergent condition) '[]'$  - 0 New Admissions / Biomedical engineer / Ordering NPWT Apligraf, etc. , '[]'$  - 0 Emergency Hospital Admission (emergent condition) X- 1 10 Simple Discharge Coordination '[]'$  - 0 Complex (extensive) Discharge Coordination PROCESS - Special Needs '[]'$  - 0 Pediatric / Minor Patient Management '[]'$  - 0 Isolation Patient Management '[]'$  - 0 Hearing / Language / Visual special needs '[]'$  - 0 Assessment of Community assistance (transportation, D/C planning, etc.) '[]'$  - 0 Additional assistance / Altered mentation '[]'$  -  0 Support Surface(s) Assessment (bed, cushion, seat, etc.) INTERVENTIONS - Wound Cleansing / Measurement X - Simple Wound Cleansing - one wound 1 5 '[]'$  - 0 Complex Wound Cleansing -  multiple wounds X- 1 5 Wound Imaging (photographs - any number of wounds) '[]'$  - 0 Wound Tracing (instead of photographs) X- 1 5 Simple Wound Measurement - one wound '[]'$  - 0 Complex Wound Measurement - multiple wounds INTERVENTIONS - Wound Dressings '[]'$  - 0 Small Wound Dressing one or multiple wounds '[]'$  - 0 Medium Wound Dressing one or multiple wounds '[]'$  - 0 Large Wound Dressing one or multiple wounds '[]'$  - 0 Application of Medications - topical '[]'$  - 0 Application of Medications - injection INTERVENTIONS - Miscellaneous '[]'$  - 0 External ear exam '[]'$  - 0 Specimen Collection (cultures, biopsies, blood, body fluids, etc.) '[]'$  - 0 Specimen(s) / Culture(s) sent or taken to Lab for analysis '[]'$  - 0 Patient Transfer (multiple staff / Civil Service fast streamer / Similar devices) '[]'$  - 0 Simple Staple / Suture removal (25 or less) '[]'$  - 0 Complex Staple / Suture removal (26 or more) '[]'$  - 0 Hypo / Hyperglycemic Management (close monitor of Blood Glucose) '[]'$  - 0 Ankle / Brachial Index (ABI) - do not check if billed separately X- 1 5 Vital Signs Has the patient been seen at the hospital within the last three years: Yes Total Score: 90 Level Of Care: New/Established - Level 3 Electronic Signature(s) Signed: 03/27/2022 2:56:41 PM By: Deon Pilling RN, BSN Entered By: Deon Pilling on 03/27/2022 09:48:35 -------------------------------------------------------------------------------- Encounter Discharge Information Details Patient Name: Date of Service: ZEVEN, KOCAK 03/27/2022 9:00 A M Medical Record Number: [id number] Patient Account Number: [id number] Date of Birth/Sex: Treating RN: Nov 30, 1955 (66 y.o. Hessie Diener Primary Care Ronelle Michie: Abelino Derrick Other Clinician: Referring Evo Aderman: Treating Lenyx Boody/Extender:  Hedda Slade in Treatment: 11 Encounter Discharge Information Items Discharge Condition: Stable Ambulatory Status: Ambulatory Discharge Destination: Home Transportation: Private Auto Accompanied By: self Schedule Follow-up Appointment: No Clinical Summary of Care: Notes explained to patient to lotion, elevated legs, ambulate, and wear compression stockings for life. Patient in agreement. Electronic Signature(s) Signed: 03/27/2022 2:56:41 PM By: Deon Pilling RN, BSN Entered By: Deon Pilling on 03/27/2022 09:49:32 -------------------------------------------------------------------------------- Lower Extremity Assessment Details Patient Name: Date of Service: AINSLEY, SANGUINETTI 03/27/2022 9:00 A M Medical Record Number: [id number] Patient Account Number: [id number] Date of Birth/Sex: Treating RN: [birth year] (67 y.o. Hessie Diener Primary Care Raenell Mensing: Abelino Derrick Other Clinician: Referring Jenin Birdsall: Treating Janaysha Depaulo/Extender: Hedda Slade in Treatment: 11 Edema Assessment Assessed: Shirlyn Goltz: No] Patrice Paradise: Yes] Edema: [Left: N] [Right: o] Calf Left: Right: Point of Measurement: 39 cm From Medial Instep 41 cm Ankle Left: Right: Point of Measurement: 15 cm From Medial Instep 25 cm Vascular Assessment Pulses: Dorsalis Pedis Palpable: [Right:Yes] Electronic Signature(s) Signed: 03/27/2022 2:56:41 PM By: Deon Pilling RN, BSN Entered By: Deon Pilling on 03/27/2022 09:03:54 -------------------------------------------------------------------------------- [place] Details Patient Name: Date of Service: MENDEL, BINSFELD 03/27/2022 9:00 A M Medical Record Number: [id number] Patient Account Number: [id number] Date of Birth/Sex: Treating RN: 07/18/1956 (66 y.o. Hessie Diener Primary Care Amellia Panik: Abelino Derrick Other Clinician: Referring Hayven Croy: Treating Chavis Tessler/Extender: Hedda Slade  in Treatment: 11 Active Inactive Electronic Signature(s) Signed: 03/27/2022 2:56:41 PM By: Deon Pilling RN, BSN Previous Signature: 03/27/2022 9:06:39 AM Version By: Deon Pilling RN, BSN Entered By: Deon Pilling on 03/27/2022 09:07:05 -------------------------------------------------------------------------------- Pain Assessment Details Patient Name: Date of Service: ARMSTRONG, CREASY 03/27/2022 9:00 A M Medical Record Number: [id number] Patient Account Number: [id number] Date of Birth/Sex: Treating RN: 1956/02/23 (66 y.o. Hessie Diener Primary Care Davied Nocito: Abelino Derrick Other Clinician: Referring Loretta Doutt: Treating Bayan Hedstrom/Extender:  Stone III, Hector Brunswick, Gwyndolyn Saxon Weeks in Treatment: 11 Active Problems Location of Pain Severity and Description of Pain Patient Has Paino No Site Locations Pain Management and Medication Current Pain Management: Electronic Signature(s) Signed: 03/27/2022 2:56:41 PM By: Deon Pilling RN, BSN Signed: 04/16/2022 8:46:54 AM By: Erenest Blank Entered By: Erenest Blank on 03/27/2022 08:59:26 -------------------------------------------------------------------------------- Patient/Caregiver Education Details Patient Name: Date of Service: Frances, Oval 5/24/2023andnbsp9:00 [place] Record Number: [id number] Patient Account Number: [id number] Date of Birth/Gender: Treating RN: August 18, 1956 (66 y.o. Hessie Diener Primary Care Physician: Abelino Derrick Other Clinician: Referring Physician: Treating Physician/Extender: Hedda Slade in Treatment: 11 Education Assessment Education Provided To: Patient Education Topics Provided Wound/Skin Impairment: Handouts: Skin Care Do's and Dont's Methods: Explain/Verbal Responses: Reinforcements needed Electronic Signature(s) Signed: 03/27/2022 2:56:41 PM By: Deon Pilling RN, BSN Entered By: Deon Pilling on 03/27/2022  09:07:16 -------------------------------------------------------------------------------- Wound Assessment Details Patient Name: Date of Service: BRIANA, NEWMAN 03/27/2022 9:00 A M Medical Record Number: [id number] Patient Account Number: [id number] Date of Birth/Sex: Treating RN: 04-18-1956 (66 y.o. Lorette Ang, Meta.Reding Primary Care Ariel Wingrove: Abelino Derrick Other Clinician: Referring Nicholis Stepanek: Treating Deyonte Cadden/Extender: Hedda Slade in Treatment: 11 Wound Status Wound Number: 1 Primary Venous Leg Ulcer Etiology: Wound Location: Right, Posterior Lower Leg Wound Healed - Epithelialized Wounding Event: Gradually Appeared Status: Date Acquired: 12/13/2021 Comorbid Hypertension, Peripheral Arterial Disease, Peripheral Venous Weeks Of Treatment: 11 History: Disease, Type II Diabetes, Osteoarthritis, Neuropathy Clustered Wound: Yes Photos Wound Measurements Length: (cm) Width: (cm) Depth: (cm) Clustered Quantity: Area: (cm) Volume: (cm) 0 % Reduction in Area: 100% 0 % Reduction in Volume: 100% 0 Epithelialization: Large (67-100%) 1 0 0 Wound Description Classification: Full Thickness Without Exposed Support Structures Wound Margin: Distinct, outline attached Exudate Amount: None Present Foul Odor After Cleansing: No Slough/Fibrino No Wound Bed Granulation Amount: None Present (0%) Exposed Structure Necrotic Amount: None Present (0%) Fascia Exposed: No Fat Layer (Subcutaneous Tissue) Exposed: Yes Tendon Exposed: No Muscle Exposed: No Joint Exposed: No Bone Exposed: No Electronic Signature(s) Signed: 03/27/2022 2:56:41 PM By: Deon Pilling RN, BSN Entered By: Deon Pilling on 03/27/2022 02:11:15 -------------------------------------------------------------------------------- [place] Details Patient Name: Date of Service: Rock Nephew, Isiaih 03/27/2022 9:00 A M Medical Record Number: [id number] Patient Account Number: [id number] Date of  Birth/Sex: Treating RN: [birth year] (66 y.o. Lorette Ang, Meta.Reding Primary Care Jentzen Minasyan: Abelino Derrick Other Clinician: Referring Diane Hanel: Treating Mohamud Mrozek/Extender: Hedda Slade in Treatment: 11 Vital Signs Time Taken: 08:58 Temperature (F): 98.1 Height (in): 73 Pulse (bpm): 78 Weight (lbs): 231 Respiratory Rate (breaths/min): 18 Body Mass Index (BMI): 30.5 Blood Pressure (mmHg): 157/81 Reference Range: 80 - 120 mg / dl Electronic Signature(s) Signed: 04/16/2022 8:46:54 AM By: Erenest Blank Entered By: Erenest Blank on 03/27/2022 08:59:18

## 2022-04-17 ENCOUNTER — Ambulatory Visit: Payer: Medicare Other

## 2022-04-19 ENCOUNTER — Telehealth: Payer: Self-pay

## 2022-04-19 NOTE — Telephone Encounter (Signed)
Pt missed coumadin clinic apt on 6/14. Contacted pt and RS for 6/21. Pt reported he has a colonoscopy scheduled for 7/18. Pt will need lovenox bridge. Advised a schedule will be made for him and this nurse will f/u with instructions.    Lovenox Bridge Schedule: Procedure: Colonoscopy Wt: 105.1 kg CrCl: 56.08 mL/min Lovenox, QD, '150mg'$   7/13: Take last dose coumadin 7/14: No coumadin, no Lovenox 7/15: No coumadin, Lovenox in the AM 7/16: No coumadin, Lovenox in the AM 7/17: No coumadin, Lovenox in the AM (BEFORE 7 AM)  7/18: Procedure; NO COUMADIN, NO LOVENOX  7/19: Take 2 1/2 tablets coumadin, Lovenox AM 7/20: Take 2 1/2 tablets coumadin, Lovenox AM 7/21: Take 1 1/2 tablets coumadin, Lovenox AM 7/22: Take 2 1/2 tablets coumadin, Lovenox AM 7/23: Take 1 1/2 tablets coumadin, Lovenox AM 7/24: Recheck INR

## 2022-04-24 ENCOUNTER — Ambulatory Visit (HOSPITAL_COMMUNITY)
Admission: RE | Admit: 2022-04-24 | Discharge: 2022-04-24 | Disposition: A | Discharge status: Home / Self Care | Payer: Medicare Other | Source: Ambulatory Visit | Attending: Vascular Surgery | Admitting: Vascular Surgery

## 2022-04-24 ENCOUNTER — Ambulatory Visit (INDEPENDENT_AMBULATORY_CARE_PROVIDER_SITE_OTHER): Payer: Medicare Other | Admitting: Vascular Surgery

## 2022-04-24 ENCOUNTER — Ambulatory Visit (INDEPENDENT_AMBULATORY_CARE_PROVIDER_SITE_OTHER): Payer: Medicare Other

## 2022-04-24 ENCOUNTER — Encounter: Payer: Self-pay | Admitting: Vascular Surgery

## 2022-04-24 VITALS — BP 133/74 | HR 80 | Temp 98.7°F | Resp 18 | Ht 74.0 in | Wt 235.1 lb

## 2022-04-24 DIAGNOSIS — I872 Venous insufficiency (chronic) (peripheral): Secondary | ICD-10-CM

## 2022-04-24 DIAGNOSIS — Z7901 Long term (current) use of anticoagulants: Secondary | ICD-10-CM

## 2022-04-24 DIAGNOSIS — I739 Peripheral vascular disease, unspecified: Secondary | ICD-10-CM

## 2022-04-24 LAB — POCT INR: INR: 3.6 — AB (ref 2.0–3.0)

## 2022-04-24 MED ORDER — ENOXAPARIN SODIUM 150 MG/ML IJ SOSY: 150.0000 mg | PREFILLED_SYRINGE | INTRAMUSCULAR | 0 refills | Status: AC

## 2022-04-24 NOTE — Progress Notes (Addendum)
Hold dose tomorrow and then change weekly dose to take 7.5 mg daily except take 5 mg on Tuesdays, Thursdays and Saturdays. Recheck in 5 week.   Coumadin dosing after surgery has been update to pt's new dosing.   7/13: Take last dose coumadin 7/14: No coumadin, no Lovenox 7/15: No coumadin, Lovenox in the AM 7/16: No coumadin, Lovenox in the AM 7/17: No coumadin, Lovenox in the AM (BEFORE 7 AM)   7/18: Procedure; NO COUMADIN, NO LOVENOX   7/19: Take 2 1/2 tablets coumadin, Lovenox AM 7/20: Take 1 1/2 tablets coumadin, Lovenox AM 7/21: Take 2 1/2 tablets coumadin, Lovenox AM 7/22: Take 2 1/2 tablets coumadin, Lovenox AM 7/23: Take 1 1/2 tablets coumadin, Lovenox AM 7/24: Recheck INR  Sent in lovenox

## 2022-04-24 NOTE — Patient Instructions (Addendum)
Pre visit review using our clinic review tool, if applicable. No additional management support is needed unless otherwise documented below in the visit note.  Hold dose tomorrow and then change weekly dose to take 7.5 mg daily except take 5 mg on Tuesdays, Thursdays and Saturdays. Recheck in 5 week.   7/13: Take last dose coumadin 7/14: No coumadin, no Lovenox 7/15: No coumadin, Lovenox in the AM 7/16: No coumadin, Lovenox in the AM 7/17: No coumadin, Lovenox in the AM (BEFORE 7 AM)   7/18: Procedure; NO COUMADIN, NO LOVENOX   7/19: Take 2 1/2 tablets coumadin, Lovenox AM 7/20: Take 1 1/2 tablets coumadin, Lovenox AM 7/21: Take 2 1/2 tablets coumadin, Lovenox AM 7/22: Take 2 1/2 tablets coumadin, Lovenox AM 7/23: Take 1 1/2 tablets coumadin, Lovenox AM 7/24: Recheck INR

## 2022-04-24 NOTE — Progress Notes (Signed)
REASON FOR VISIT:   43-monthfollow-up for chronic venous insufficiency  MEDICAL ISSUES:   CHRONIC VENOUS INSUFFICIENCY: This patient has CEAP C5 venous disease.  The wound on his right leg has almost completely healed.  He still has significant leg swelling.  I have encouraged him to continue to elevate his legs.  He will continue to wear his compression stockings with a gradient of 20 to 30 mmHg.  Given the persistent swelling and in order to lower his risk of developing recurrent ulcerations, I think he would be a good candidate for laser ablation of the left great saphenous vein.  In addition he may be a good candidate for staged laser ablation of the left small saphenous vein also.  I have discussed the indications for endovenous laser ablation of the right GSV, that is to lower the pressure in the veins and potentially help relieve the symptoms from venous hypertension.  I have also discussed alternative options such as conservative treatment as described above. I have discussed the potential complications of the procedure, including bleeding, bruising, leg swelling, deep venous thrombosis (<1% risk), or failure of the vein to close <1% risk).  I have also explained that venous insufficiency is a chronic disease, and that the patient is at risk for recurrent varicose veins in the future.  All of the patient's questions were encouraged and answered. They are agreeable to proceed.   HPI:   [name] Delbuonois a pleasant 66y.o. male who I last saw on 01/24/2022.  He was referred for evaluation of peripheral arterial disease.  He also developed swelling in his right leg and developed a wound in the posterior aspect of his right leg which was being treated at the wound care center.  He had an arterial Doppler study at that visit and had normal ABIs on the right with a toe pressure of 132 mmHg.  He likewise had normal Doppler signals in the left leg with normal ABIs and a toe pressure of 141 mmHg.  Thus  he had no evidence of arterial insufficiency bilaterally.  I felt that he had significant chronic venous insufficiency with a venous ulcer.  We recommended compression therapy at by the wound care center.  We discussed the importance of leg elevation and the proper positioning for this.  We did get the wound to heal a plan would be to get a knee-high stocking with a compression gradient of 20 to 30 mmHg.  I plan on seeing him back in 3 months with formal venous reflux testing on the right.  Since I saw him last the wound on the right calf has almost completely healed.  He has been discharged from the wound care center.  He does continue to have problems with swelling in the right leg.  He has been wearing his 20-30 gradient compression stockings.  He has been elevating his legs.  Past Medical History:  Diagnosis Date   Arthritis    Chronic kidney disease    reduced kidney function. Stage III   Diabetes mellitus without complication (HCC)    DVT (deep venous thrombosis) ([facility]    age indeterminate LLE DVT 02/09/20   Dyspnea    On exertion   History of kidney stones    History of pulmonary embolus (PE)    Hypertension    Lipidemia    Prostate enlargement    Pulmonary embolism ([facility] 2009   Sleep apnea     Family History  Problem Relation Age of  Onset   Cancer Mother    Heart disease Father     SOCIAL HISTORY: Social History   Tobacco Use   Smoking status: Never   Smokeless tobacco: Never  Substance Use Topics   Alcohol use: Yes    Alcohol/week: 1.0 standard drink of alcohol    Types: 1 Cans of beer per week    Comment: social    No Known Allergies  Current Outpatient Medications  Medication Sig Dispense Refill   ACCU-CHEK GUIDE test strip USE TO CHECK BLOOD SUGAR 3 TIMES DAILY.E11.65 100 strip 12   Accu-Chek Softclix Lancets lancets Use as instructed to check blood sugar 3 times daily Dx is E11.65 100 each 2   acetaminophen (TYLENOL) 650 MG CR tablet Take 1,300 mg by mouth 2  (two) times daily.     Blood Glucose Monitoring Suppl (ACCU-CHEK AVIVA) device by Other route. Use as instructed     Continuous Blood Gluc Sensor (DEXCOM G6 SENSOR) MISC 1 Device by Does not apply route as directed. 9 each 3   Continuous Blood Gluc Transmit (DEXCOM G6 TRANSMITTER) MISC 1 Device by Does not apply route as directed. 1 each 3   gabapentin (NEURONTIN) 300 MG capsule Take 2 capsules (600 mg total) by mouth 2 (two) times daily. (Patient taking differently: Take 300 mg by mouth 4 (four) times daily.) 120 capsule 3   insulin glargine (LANTUS) 100 UNIT/ML injection Inject 0.2 mLs (20 Units total) into the skin daily. (Patient taking differently: Inject 15 Units into the skin 2 (two) times daily.) 40 mL 3   insulin lispro (HUMALOG KWIKPEN) 100 UNIT/ML KwikPen Contact your endocrinologist and/or use a sliding scale that you have used before-to be clear this is a short acting insulin and she must of given you instructions with regards to its use 45 mL 4   Insulin Pen Needle 32G X 4 MM MISC 1 Device by Does not apply route 3 (three) times daily. 300 each 3   Insulin Syringe-Needle U-100 (INSULIN SYRINGES) 31G X 5/16" 0.5 ML MISC 1 Device by Does not apply route daily in the afternoon. 100 each 3   Insulin Syringes, Disposable, U-100 1 ML MISC 1 Device by Does not apply route as directed. 100 each 11   losartan (COZAAR) 50 MG tablet Take 50 mg by mouth daily.     lovastatin (MEVACOR) 40 MG tablet Take 1 tablet (40 mg total) by mouth at bedtime. 90 tablet 1   TRULICITY 1.5 XT/0.6YI SOPN Inject into the skin once a week.     warfarin (COUMADIN) 5 MG tablet Take 5-7.5 mg by mouth See admin instructions. Taking 5 mg on Thursday and Saturdays and  taking 7.5 mg on Monday, Tuesday, WEdnesday, and Sunday.     No current facility-administered medications for this visit.    REVIEW OF SYSTEMS:  '[X]'$  denotes positive finding, '[ ]'$  denotes negative finding Cardiac  Comments:  Chest pain or chest pressure:     Shortness of breath upon exertion:    Short of breath when lying flat:    Irregular heart rhythm:        Vascular    Pain in calf, thigh, or hip brought on by ambulation:    Pain in feet at night that wakes you up from your sleep:     Blood clot in your veins:    Leg swelling:  x       Pulmonary    Oxygen at home:    Productive cough:  Wheezing:         Neurologic    Sudden weakness in arms or legs:     Sudden numbness in arms or legs:     Sudden onset of difficulty speaking or slurred speech:    Temporary loss of vision in one eye:     Problems with dizziness:         Gastrointestinal    Blood in stool:     Vomited blood:         Genitourinary    Burning when urinating:     Blood in urine:        Psychiatric    Major depression:         Hematologic    Bleeding problems:    Problems with blood clotting too easily:        Skin    Rashes or ulcers: x       Constitutional    Fever or chills:     PHYSICAL EXAM:   Vitals:   04/24/22 1406  BP: 133/74  Pulse: 80  Resp: 18  Temp: 98.7 F (37.1 C)  TempSrc: Temporal  SpO2: 97%  Weight: 235 lb 1.6 oz (106.6 kg)  Height: '6\' 2"'$  (1.88 m)    GENERAL: The patient is a well-nourished male, in no acute distress. The vital signs are documented above. CARDIAC: There is a regular rate and rhythm.  VASCULAR: I do not detect carotid bruits. He has biphasic Doppler signals in both feet in the dorsalis pedis and posterior tibial positions. He has hyperpigmentation in the right leg.  The wound of the posterior aspect of his right leg is almost completely healed. He has significant swelling in the right leg.     I did look at the right great saphenous vein myself with the SonoSite.  He has significant reflux.  The vein is markedly dilated in the proximal and mid thigh.  In the mid thigh, the vein exits the fascia and is superficial.  I also looked at the small saphenous vein.  He appears to have a vein of Giacomini  with no connection to the deep system.  The diameter of the vein is about 4-1/2 mm.  PULMONARY: There is good air exchange bilaterally without wheezing or rales. ABDOMEN: Soft and non-tender with normal pitched bowel sounds.  MUSCULOSKELETAL: There are no major deformities or cyanosis. NEUROLOGIC: No focal weakness or paresthesias are detected. SKIN: There are no ulcers or rashes noted. PSYCHIATRIC: The patient has a normal affect.  DATA:    VENOUS DUPLEX: I have independently interpreted his venous duplex scan today.  This was of the right lower extremity only.  There was no evidence of DVT.  There was deep venous reflux involving the common femoral vein and popliteal vein.  There was superficial venous reflux in the right great saphenous vein in the thigh and also in the small saphenous vein down to the mid calf.  There are also 2 incompetent perforators in the mid calf.     Deitra Mayo [facility] [phone]

## 2022-05-02 ENCOUNTER — Telehealth: Payer: Self-pay | Admitting: Family Medicine

## 2022-05-02 NOTE — Telephone Encounter (Signed)
[name] is calling  from [employer]. Scot Dock, MD, FACS Vein and Vascular. She is wondering if our office has gotten pt's form to discontinue his coumadin prior to his upcoming surgery. Please advise Sonya at [phone].

## 2022-05-02 NOTE — Telephone Encounter (Signed)
Form filled out signed and faxed today. Called Sonya to inform, no answer LM

## 2022-05-08 DIAGNOSIS — H2513 Age-related nuclear cataract, bilateral: Secondary | ICD-10-CM | POA: Diagnosis not present

## 2022-05-08 DIAGNOSIS — E119 Type 2 diabetes mellitus without complications: Secondary | ICD-10-CM | POA: Diagnosis not present

## 2022-05-08 DIAGNOSIS — H5213 Myopia, bilateral: Secondary | ICD-10-CM | POA: Diagnosis not present

## 2022-05-08 LAB — HM DIABETES EYE EXAM

## 2022-05-09 ENCOUNTER — Encounter: Payer: Medicare Other | Admitting: Internal Medicine

## 2022-05-12 DIAGNOSIS — G4733 Obstructive sleep apnea (adult) (pediatric): Secondary | ICD-10-CM | POA: Diagnosis not present

## 2022-05-20 ENCOUNTER — Telehealth: Payer: Self-pay | Admitting: Internal Medicine

## 2022-05-20 NOTE — Telephone Encounter (Signed)
Noted  

## 2022-05-20 NOTE — Telephone Encounter (Signed)
PT did  not stop his Coumadin until 7/15 and was scheduled for Colonoscopy with Dr. Henrene Pastor tomorrow. He was rescheduled for 7/28. Told the patient to make sure he contacts Dr. Bebe Shaggy office to get an updated Coumadin/ Lovenox bridge instructions sheet. PT verbalized understanding.

## 2022-05-21 ENCOUNTER — Encounter: Payer: Medicare Other | Admitting: Internal Medicine

## 2022-05-23 NOTE — Telephone Encounter (Signed)
Colonoscopy RS for 7/28. Updated lovenox bridge below.  Lovenox Bridge Schedule: Procedure: Colonoscopy on 7/28 Wt: 105.1 kg CrCl: 56.08 mL/min Lovenox, QD, '150mg'$ , 9 syringes   7/23: Take last dose coumadin 7/24: No coumadin, no Lovenox 7/25: No coumadin, Lovenox in the AM 7/26: No coumadin, Lovenox in the AM 7/27: No coumadin, Lovenox in the AM (BEFORE 7 AM)   7/28: Procedure; NO COUMADIN, NO LOVENOX   7/29: Take 1 1/2 tablets (7.5 mg) coumadin, Lovenox AM 7/30: Take 2 1/2 tablets (12.5 mg) coumadin, Lovenox AM 7/31: Take 2 1/2 tablets (12.5 mg) coumadin, Lovenox AM 8/1: Take 1 1/2 tablets (7.5 mg) coumadin, Lovenox AM 8/2: Take 1 1/2 tablets (7.5 mg) coumadin, Lovenox AM 8/3: Take 1 tablets (5 mg) coumadin, Lovenox AM 8/4: Take 1 1/2 tablets (7.5 mg) coumadin, Stop lovenox 8/5: Take 1 tablet (5 mg) coumadin, No lovenox 8/6: Take 1 1/2 tablets (7.5 mg) coumadin, No lovenox 8/7: Recheck INR   Pt has apt for coumadin clinic on 7/24. LVM for pt to return call. Would like pt to come in on 7/24 for updated lovenox bridge schedule.

## 2022-05-24 NOTE — Telephone Encounter (Signed)
Contacted pt and advised an updated lovenox bridge schedule has been created and it will be explained at his coumadin clinic apt on 7/24. Pt appreciative and verbalized understanding.

## 2022-05-26 IMAGING — DX DG KNEE 1-2V PORT*L*
1 series · 4 of 4 positions shown · non-contrast
Comparison: Knee radiographs 06/30/2020

CLINICAL DATA: Postop knee pain

EXAM:
PORTABLE LEFT KNEE - 1-2 VIEW

[Series 1: knee · 0.14mm/px · 4 of 4 slices shown]
[im 1/4]
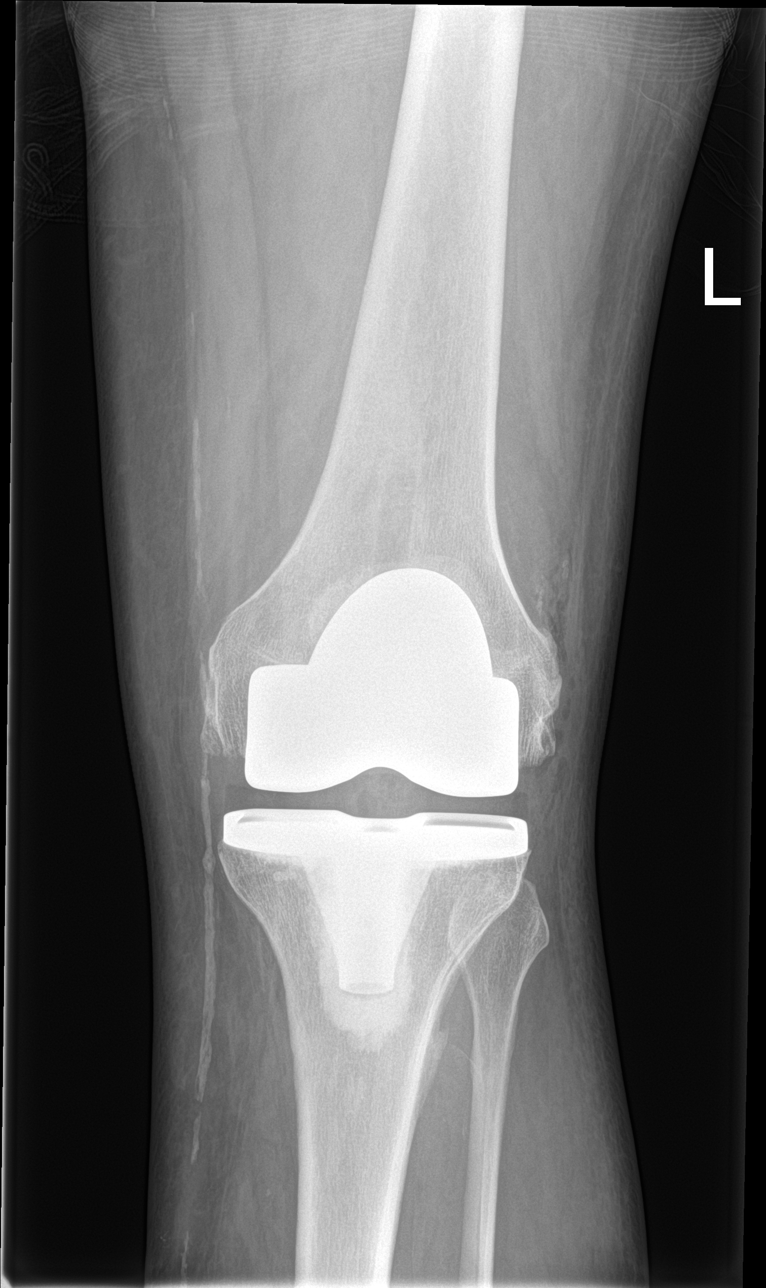
[im 2/4]
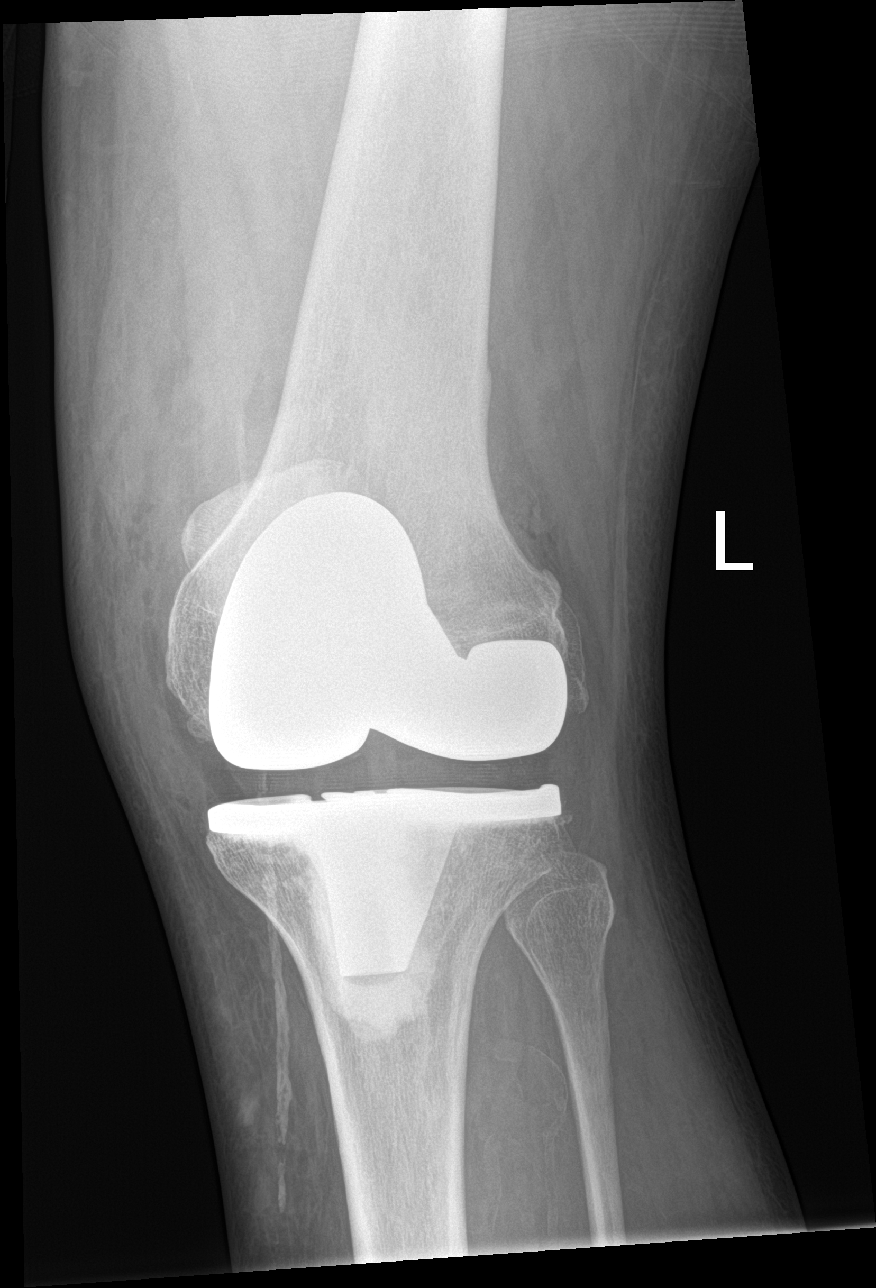
[im 3/4]
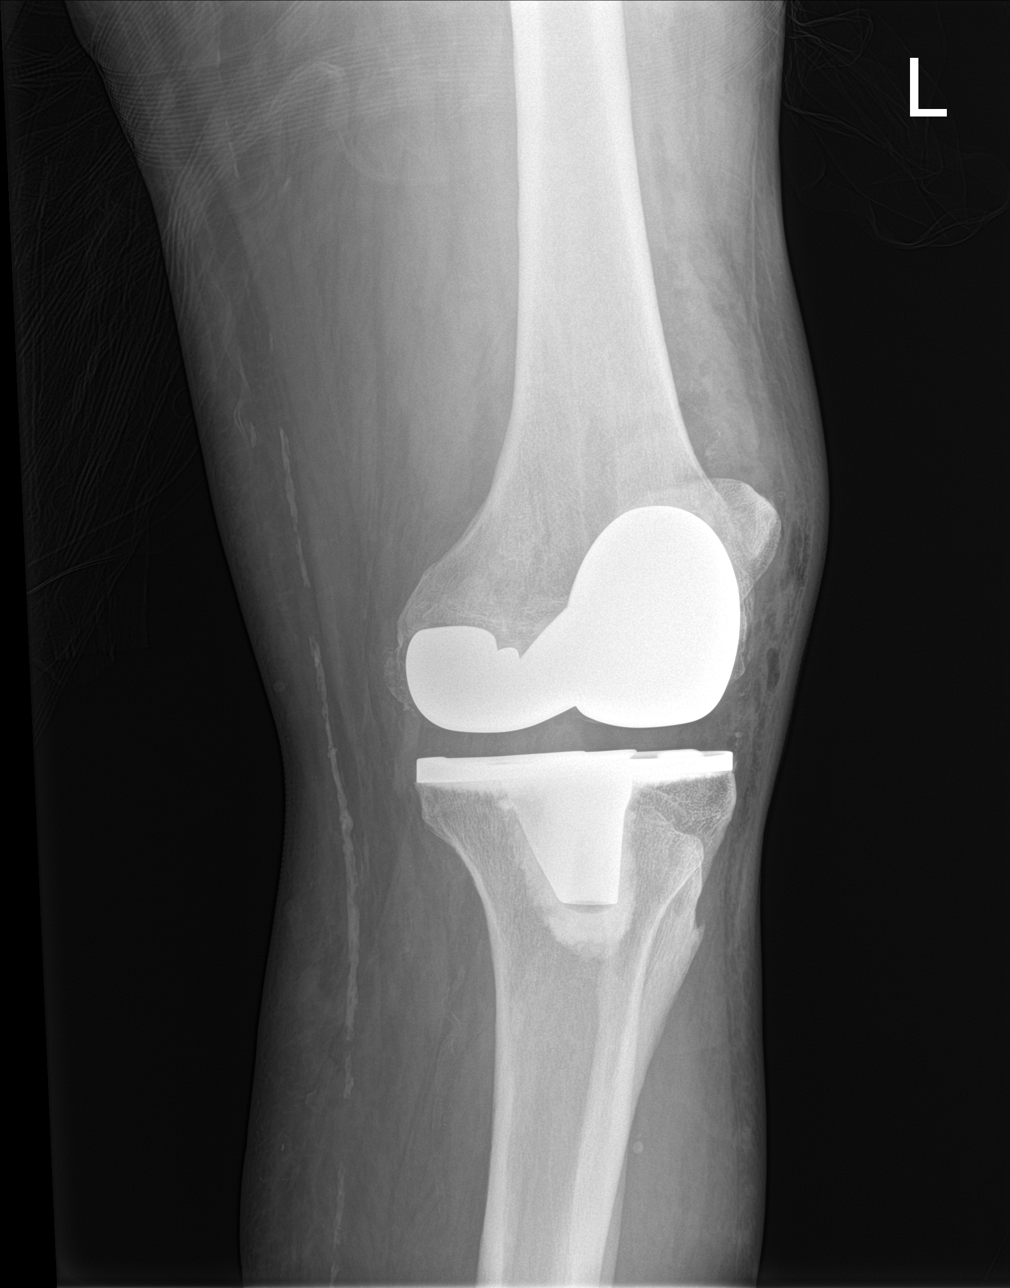
[im 4/4]
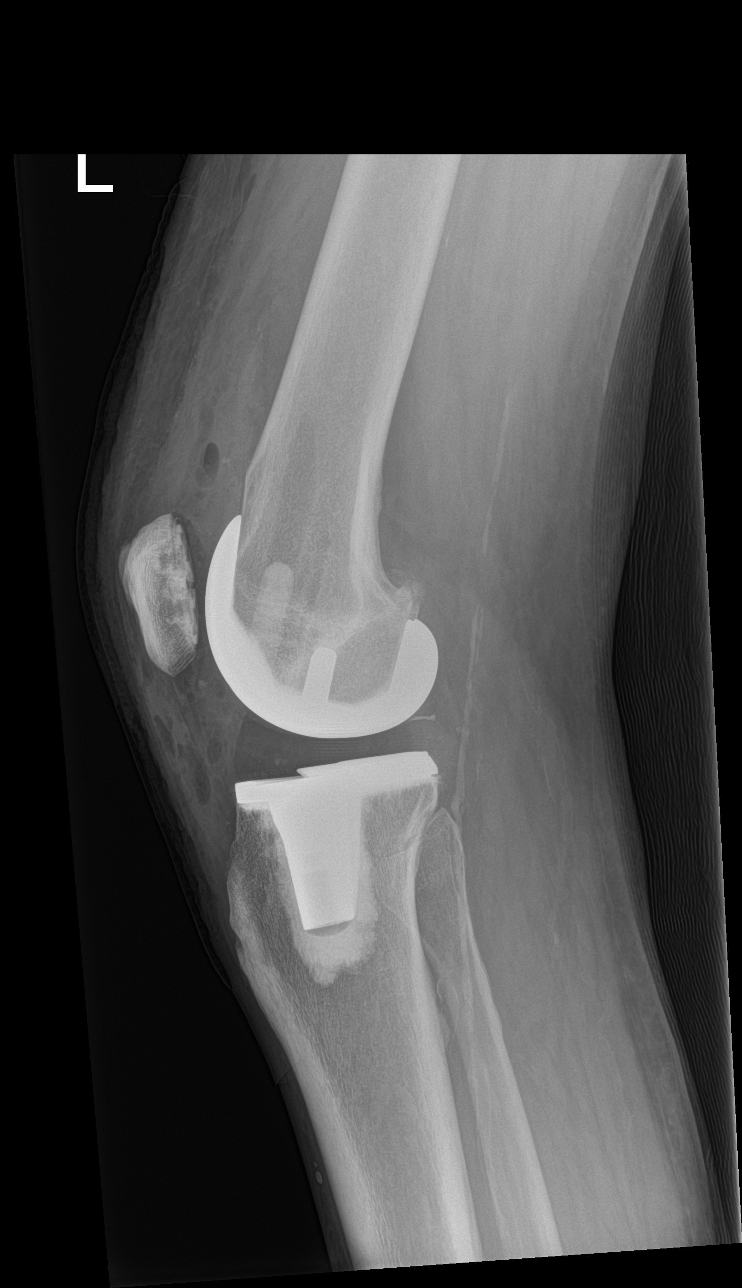

[4 of 4 positions shown; findings below may reference images not displayed]

FINDINGS: There has been interval left total knee arthroplasty. Hardware
alignment is within expected limits, without evidence of hardware
related complication. Surrounding soft tissue swelling and soft
tissue gas is consistent with recent operation.
IMPRESSION: Status post left knee arthroplasty without evidence of immediate
complication.

## 2022-05-27 ENCOUNTER — Ambulatory Visit (INDEPENDENT_AMBULATORY_CARE_PROVIDER_SITE_OTHER): Payer: Medicare Other

## 2022-05-27 DIAGNOSIS — Z7901 Long term (current) use of anticoagulants: Secondary | ICD-10-CM | POA: Diagnosis not present

## 2022-05-27 LAB — POCT INR: INR: 2.4 (ref 2.0–3.0)

## 2022-05-27 NOTE — Patient Instructions (Addendum)
Pre visit review using our clinic review tool, if applicable. No additional management support is needed unless otherwise documented below in the visit note.  7/23: Take last dose coumadin 7/24: No coumadin, no Lovenox 7/25: No coumadin, Lovenox in the AM 7/26: No coumadin, Lovenox in the AM 7/27: No coumadin, Lovenox in the AM (BEFORE 7 AM)   7/28: Procedure; NO COUMADIN, NO LOVENOX   7/29: Take 1 1/2 tablets (7.5 mg) coumadin, Lovenox AM 7/30: Take 2 1/2 tablets (12.5 mg) coumadin, Lovenox AM 7/31: Take 2 1/2 tablets (12.5 mg) coumadin, Lovenox AM 8/1: Take 1 1/2 tablets (7.5 mg) coumadin, Lovenox AM 8/2: Take 1 1/2 tablets (7.5 mg) coumadin, Lovenox AM 8/3: Take 1 tablets (5 mg) coumadin, Lovenox AM 8/4: Take 1 1/2 tablets (7.5 mg) coumadin, Stop lovenox 8/5: Take 1 tablet (5 mg) coumadin, No lovenox 8/6: Take 1 1/2 tablets (7.5 mg) coumadin, No lovenox 8/7: Recheck INR

## 2022-05-27 NOTE — Progress Notes (Signed)
7/23: Take last dose coumadin 7/24: No coumadin, no Lovenox 7/25: No coumadin, Lovenox in the AM 7/26: No coumadin, Lovenox in the AM 7/27: No coumadin, Lovenox in the AM (BEFORE 7 AM)   7/28: Procedure; NO COUMADIN, NO LOVENOX   7/29: Take 1 1/2 tablets (7.5 mg) coumadin, Lovenox AM 7/30: Take 2 1/2 tablets (12.5 mg) coumadin, Lovenox AM 7/31: Take 2 1/2 tablets (12.5 mg) coumadin, Lovenox AM 8/1: Take 1 1/2 tablets (7.5 mg) coumadin, Lovenox AM 8/2: Take 1 1/2 tablets (7.5 mg) coumadin, Lovenox AM 8/3: Take 1 tablets (5 mg) coumadin, Lovenox AM 8/4: Take 1 1/2 tablets (7.5 mg) coumadin, Stop lovenox 8/5: Take 1 tablet (5 mg) coumadin, No lovenox 8/6: Take 1 1/2 tablets (7.5 mg) coumadin, No lovenox 8/7: Recheck INR

## 2022-05-31 ENCOUNTER — Ambulatory Visit (AMBULATORY_SURGERY_CENTER): Payer: Medicare Other | Admitting: Internal Medicine

## 2022-05-31 ENCOUNTER — Encounter: Payer: Self-pay | Admitting: Internal Medicine

## 2022-05-31 ENCOUNTER — Other Ambulatory Visit: Payer: Self-pay | Admitting: Family Medicine

## 2022-05-31 VITALS — BP 103/56 | HR 87 | Temp 97.5°F | Resp 14 | Ht 73.0 in | Wt 231.0 lb

## 2022-05-31 DIAGNOSIS — G4730 Sleep apnea, unspecified: Secondary | ICD-10-CM | POA: Diagnosis not present

## 2022-05-31 DIAGNOSIS — D123 Benign neoplasm of transverse colon: Secondary | ICD-10-CM

## 2022-05-31 DIAGNOSIS — Z1211 Encounter for screening for malignant neoplasm of colon: Secondary | ICD-10-CM | POA: Diagnosis not present

## 2022-05-31 DIAGNOSIS — I10 Essential (primary) hypertension: Secondary | ICD-10-CM | POA: Diagnosis not present

## 2022-05-31 MED ORDER — SODIUM CHLORIDE 0.9 % IV SOLN: 500.0000 mL | Freq: Once | INTRAVENOUS | Status: DC

## 2022-05-31 NOTE — Progress Notes (Signed)
PT taken to PACU. Monitors in place. VSS. Report given to RN. 

## 2022-05-31 NOTE — Patient Instructions (Signed)
YOU HAD AN ENDOSCOPIC PROCEDURE TODAY AT THE [facility]:   Refer to the procedure report that was given to you for any specific questions about what was found during the examination.  If the procedure report does not answer your questions, please call your gastroenterologist to clarify.  If you requested that your care partner not be given the details of your procedure findings, then the procedure report has been included in a sealed envelope for you to review at your convenience later.  YOU SHOULD EXPECT: Some feelings of bloating in the abdomen. Passage of more gas than usual.  Walking can help get rid of the air that was put into your GI tract during the procedure and reduce the bloating. If you had a lower endoscopy (such as a colonoscopy or flexible sigmoidoscopy) you may notice spotting of blood in your stool or on the toilet paper. If you underwent a bowel prep for your procedure, you may not have a normal bowel movement for a few days.  Please Note:  You might notice some irritation and congestion in your nose or some drainage.  This is from the oxygen used during your procedure.  There is no need for concern and it should clear up in a day or so.  SYMPTOMS TO REPORT IMMEDIATELY:  Following lower endoscopy (colonoscopy or flexible sigmoidoscopy):  Excessive amounts of blood in the stool  Significant tenderness or worsening of abdominal pains  Swelling of the abdomen that is new, acute  Fever of 100F or higher  Following upper endoscopy (EGD)  Vomiting of blood or coffee ground material  New chest pain or pain under the shoulder blades  Painful or persistently difficult swallowing  New shortness of breath  Fever of 100F or higher  Black, tarry-looking stools  For urgent or emergent issues, a gastroenterologist can be reached at any hour by calling [phone]. Do not use MyChart messaging for urgent concerns.    DIET:  We do recommend a small meal at first, but  then you may proceed to your regular diet.  Drink plenty of fluids but you should avoid alcoholic beverages for 24 hours.  ACTIVITY:  You should plan to take it easy for the rest of today and you should NOT DRIVE or use heavy machinery until tomorrow (because of the sedation medicines used during the test).    FOLLOW UP: Our staff will call the number listed on your records the next business day following your procedure.  We will call around 7:15- 8:00 am to check on you and address any questions or concerns that you may have regarding the information given to you following your procedure. If we do not reach you, we will leave a message.  If you develop any symptoms (ie: fever, flu-like symptoms, shortness of breath, cough etc.) before then, please call [phone].  If you test positive for Covid 19 in the 2 weeks post procedure, please call and report this information to us.    If any biopsies were taken you will be contacted by phone or by letter within the next 1-3 weeks.  Please call us at [phone] if you have not heard about the biopsies in 3 weeks.    SIGNATURES/CONFIDENTIALITY: You and/or your care partner have signed paperwork which will be entered into your electronic medical record.  These signatures attest to the fact that that the information above on your After Visit Summary has been reviewed and is understood.  Full responsibility of the confidentiality   of this discharge information lies with you and/or your care-partner.  

## 2022-05-31 NOTE — Op Note (Signed)
[facility] Patient Name: [name] Procedure Date: 05/31/2022 1:38 PM MRN: [id number] Endoscopist: Docia Chuck. Henrene Pastor , MD Age: 66 Referring MD:  Date of Birth: [birth year] Gender: Male Account #: [id number] Procedure:                Colonoscopy with cold snare polypectomy x1 Indications:              Screening for colorectal malignant neoplasm Medicines:                Monitored Anesthesia Care Procedure:                Pre-Anesthesia Assessment:                           - Prior to the procedure, a History and Physical                            was performed, and patient medications and                            allergies were reviewed. The patient's tolerance of                            previous anesthesia was also reviewed. The risks                            and benefits of the procedure and the sedation                            options and risks were discussed with the patient.                            All questions were answered, and informed consent                            was obtained. Prior Anticoagulants: The patient has                            taken Coumadin (warfarin), last dose was 5 days                            prior to procedure. ASA Grade Assessment: III - A                            patient with severe systemic disease. After                            reviewing the risks and benefits, the patient was                            deemed in satisfactory condition to undergo the                            procedure.  After obtaining informed consent, the colonoscope                            was passed under direct vision. Throughout the                            procedure, the patient's blood pressure, pulse, and                            oxygen saturations were monitored continuously. The                            CF [id number] #[id number] was introduced through the anus                            and advanced to the the  cecum, identified by                            appendiceal orifice and ileocecal valve. The                            ileocecal valve, appendiceal orifice, and rectum                            were photographed. The quality of the bowel                            preparation was excellent. The colonoscopy was                            performed without difficulty. The patient tolerated                            the procedure well. The bowel preparation used was                            SUPREP via split dose instruction. Scope In: 2:27:58 PM Scope Out: 2:46:57 PM Scope Withdrawal Time: 0 hours 14 minutes 30 seconds  Total Procedure Duration: 0 hours 18 minutes 59 seconds  Findings:                 A 5 mm polyp was found in the transverse colon. The                            polyp was removed with a cold snare. Resection and                            retrieval were complete.                           Internal hemorrhoids were found during                            retroflexion. The hemorrhoids were small.  The exam was otherwise without abnormality on                            direct and retroflexion views. Complications:            No immediate complications. Estimated blood loss:                            None. Estimated Blood Loss:     Estimated blood loss: none. Impression:               - One 5 mm polyp in the transverse colon, removed                            with a cold snare. Resected and retrieved.                           - Internal hemorrhoids.                           - The examination was otherwise normal on direct                            and retroflexion views. Recommendation:           - Repeat colonoscopy in 7 years for surveillance if                            polyp adenomatous.                           - Resume Coumadin (warfarin) and Lovenox today at                            prior dose as you have been directed by your                             Coumadin clinic.                           - Patient has a contact number available for                            emergencies. The signs and symptoms of potential                            delayed complications were discussed with the                            patient. Return to normal activities tomorrow.                            Written discharge instructions were provided to the                            patient.                           -  Resume previous diet.                           - Continue present medications.                           - Await pathology results. Docia Chuck. Henrene Pastor, MD 05/31/2022 2:58:17 PM This report has been signed electronically.

## 2022-05-31 NOTE — Progress Notes (Signed)
Vital signs checked by:DT  The medical and surgical history was reviewed and verified with the patient.  

## 2022-05-31 NOTE — Progress Notes (Signed)
Called to room to assist during endoscopic procedure.  Patient ID and intended procedure confirmed with present staff. Received instructions for my participation in the procedure from the performing physician.  

## 2022-05-31 NOTE — Progress Notes (Signed)
HISTORY OF PRESENT ILLNESS:   [name] is a 66 y.o. 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  He is sent today by his primary care provider regarding routine screening colonoscopy, which he has never had.  Patient also has insulin requiring type 2 diabetes mellitus.  He relocated from the [place] area to [place] about 3 years ago.  Patient tells me that he does have occasional mild constipation or diarrhea.  This is unchanged.  No bleeding.  GI review of systems is otherwise negative.  In terms of his pulmonary embolism, he tells me that he underwent extensive work-up looking for possible underlying causes with various blood work.  None found.  Not clear why he continues on anticoagulation after these many years.  He did not have recurrence.  In any event, no family history of colon cancer.  He did come off anticoagulation therapy for his left knee replacement in October 2022.  No issues.  Review of blood work from May 2023 shows creatinine of 1.90.  CBC reveals hemoglobin 12.3.  Last INR 1.2.   REVIEW OF SYSTEMS:   All non-GI ROS negative unless otherwise stated in the HPI except for arthritis, back pain       Past Medical History:  Diagnosis Date   Arthritis     Chronic kidney disease      reduced kidney function. Stage III   Diabetes mellitus without complication (HCC)     DVT (deep venous thrombosis) [facility])      age indeterminate LLE DVT 02/09/20   Dyspnea      On exertion   History of kidney stones     History of pulmonary embolus (PE)     Hypertension     Lipidemia     Prostate enlargement     Pulmonary embolism ([place]) 2009   Sleep apnea             Past Surgical History:  Procedure Laterality Date   CYSTOSCOPY W/ URETERAL STENT PLACEMENT Left 10/08/2020    Procedure: CYSTOSCOPY WITH  RETROGRADE PYELOGRAM/URETERAL STENT PLACEMENT;  Surgeon: Irine Seal, MD;  Location: [place];  Service: Urology;  Laterality: Left;   CYSTOSCOPY/URETEROSCOPY/HOLMIUM LASER/STENT PLACEMENT Right 06/07/2020    Procedure: CYSTOSCOPY/URETEROSCOPY/HOLMIUM LASER/STENT PLACEMENT;  Surgeon: Lucas Mallow, MD;  [facility];  Service: Urology;  Laterality: Right;   CYSTOSCOPY/URETEROSCOPY/HOLMIUM LASER/STENT PLACEMENT Left 10/19/2020    Procedure: CYSTOSCOPY LEFT URETEROSCOPY/HOLMIUM LASER/STENT EXCHANGE;  Surgeon: Irine Seal, MD;  [facility];  Service: Urology;  Laterality: Left;   TOTAL KNEE ARTHROPLASTY Left 08/06/2021    Procedure: LEFT TOTAL KNEE ARTHROPLASTY;  Surgeon: Leandrew Koyanagi, MD;  Location: [place];  Service: Orthopedics;  Laterality: Left;      Social History Mackie Holness  reports that he has never smoked. He has never used smokeless tobacco. He reports current alcohol use of about 1.0 standard drink per week. He reports that he does not use drugs.   family history includes Cancer in his mother; Heart disease in his father.   No Known Allergies       PHYSICAL EXAMINATION: Vital signs: BP 100/70   Pulse 89   Ht '6\' 1"'$  (1.854 m)   Wt 231 lb 9.6 oz (105.1 kg)   SpO2 100%   BMI 30.56 kg/m   Constitutional: generally well-appearing, no acute distress  Psychiatric: alert and oriented x3, cooperative Eyes: extraocular movements intact, anicteric, conjunctiva pink Mouth: oral pharynx moist, no lesions, poor dentition Neck: supple no lymphadenopathy Cardiovascular: heart regular rate and rhythm, no murmur Lungs: clear to auscultation bilaterally Abdomen: soft, nontender, nondistended, no obvious ascites, no peritoneal signs, normal bowel sounds, no organomegaly Rectal: Deferred to colonoscopy Extremities: no clubbing, cyanosis, or lower extremity edema bilaterally Skin: no lesions on visible extremities Neuro: No focal deficits.  Cranial nerves intact   ASSESSMENT:   1.   Colon cancer screening.  Baseline risk 2.  Remote history of DVT with pulmonary embolism on chronic Coumadin.  Next, anticoagulation in the past for surgery. 3.  Insulin requiring diabetes, type II. 4.  Poor dentition     PLAN:   1.  Colonoscopy.  The patient is high risk given his comorbidities and chronic anticoagulation.The nature of the procedure, as well as the risks, benefits, and alternatives were carefully and thoroughly reviewed with the patient. Ample time for discussion and questions allowed. The patient understood, was satisfied, and agreed to proceed.  2.  Hold Coumadin 5 days prior to the procedure.  The pros and cons of interrupting anticoagulation reviewed.  He would seem to be at low risk for recurrent DVT given his history. 3.  Hold a.m. insulin the morning of the procedure to avoid unwanted hypoglycemia. 4.  Ongoing general medical care with PCP

## 2022-06-03 ENCOUNTER — Telehealth: Payer: Self-pay

## 2022-06-03 NOTE — Telephone Encounter (Signed)
Post procedure follow up call, no answer 

## 2022-06-05 ENCOUNTER — Other Ambulatory Visit: Payer: Self-pay | Admitting: Family Medicine

## 2022-06-06 ENCOUNTER — Ambulatory Visit: Payer: Medicare Other | Admitting: Family Medicine

## 2022-06-06 ENCOUNTER — Encounter: Payer: Self-pay | Admitting: Internal Medicine

## 2022-06-10 ENCOUNTER — Ambulatory Visit: Payer: Medicare Other

## 2022-06-14 ENCOUNTER — Telehealth: Payer: Self-pay

## 2022-06-14 NOTE — Telephone Encounter (Signed)
Pt missed INR check on 8/7 after having warfarin hold for colonoscopy on 7/28.  Contacted pt who reports he has moved a couple hours [place] and is working to set up with a new provider there. Advised he should have INR checked as soon as possible. Advised if he cannot get set up soon to contact the coumadin clinic and a lab INR order can be sent and he can have a lab draw and this coumadin clinic can adjust dosing if necessary over the phone. Pt reports he has not advised his PCP yet either that he was moving. He requested a msg be sent to him also. Advised if anything further is needed to contact coumadin clinic or the PCP office. Pt verbalized understanding.

## 2022-06-18 ENCOUNTER — Ambulatory Visit: Payer: Self-pay

## 2022-06-18 NOTE — Progress Notes (Signed)
Pt has moved out of area and will set up warfarin management with new provider.  Resolving anticoagulation episodes.

## 2022-06-20 ENCOUNTER — Other Ambulatory Visit: Payer: Self-pay | Admitting: Internal Medicine

## 2022-06-21 ENCOUNTER — Other Ambulatory Visit: Payer: Self-pay | Admitting: Internal Medicine

## 2022-06-21 DIAGNOSIS — I10 Essential (primary) hypertension: Secondary | ICD-10-CM

## 2022-07-08 ENCOUNTER — Other Ambulatory Visit: Payer: Self-pay | Admitting: Internal Medicine

## 2022-07-08 ENCOUNTER — Other Ambulatory Visit: Payer: Self-pay | Admitting: Family Medicine

## 2022-07-08 DIAGNOSIS — I10 Essential (primary) hypertension: Secondary | ICD-10-CM

## 2022-07-13 DIAGNOSIS — G4733 Obstructive sleep apnea (adult) (pediatric): Secondary | ICD-10-CM | POA: Diagnosis not present

## 2022-08-01 ENCOUNTER — Ambulatory Visit: Payer: Medicare Other | Admitting: Orthopaedic Surgery

## 2022-08-05 DIAGNOSIS — G4733 Obstructive sleep apnea (adult) (pediatric): Secondary | ICD-10-CM | POA: Diagnosis not present

## 2022-08-12 DIAGNOSIS — G4733 Obstructive sleep apnea (adult) (pediatric): Secondary | ICD-10-CM | POA: Diagnosis not present

## 2022-08-20 ENCOUNTER — Other Ambulatory Visit: Payer: Self-pay

## 2022-08-20 MED ORDER — INSULIN SYRINGES (DISPOSABLE) U-100 1 ML MISC: 1.0000 | 3 refills | Status: AC

## 2022-09-03 ENCOUNTER — Other Ambulatory Visit: Payer: Self-pay | Admitting: Family Medicine

## 2022-09-03 DIAGNOSIS — I10 Essential (primary) hypertension: Secondary | ICD-10-CM

## 2022-09-03 DIAGNOSIS — N1832 Chronic kidney disease, stage 3b: Secondary | ICD-10-CM

## 2022-09-09 NOTE — Progress Notes (Deleted)
Name: [name]  Age/ Sex: 66 y.o., male   MRN/ DOB: [id number], [birth year]     PCP: [name], MD   Reason for Endocrinology Evaluation: Type 2 Diabetes Mellitus  Initial Endocrine Consultative Visit: 02/23/2020    PATIENT IDENTIFIER: Mr. [name] is a 66 y.o. male with a past medical history of HTN, T2DM and Hx of PE. The patient has followed with Endocrinology clinic since 02/23/2020 for consultative assistance with management of his diabetes.  DIABETIC HISTORY:  [name] was diagnosed with T2DM in 2009, metformin- stopped 12/2019 due to AKI as well as Glyburide. Has been on insulin mix . His hemoglobin A1c has ranged from 7.6% ,peaking at 8.6% in 2021   On her initial visit to  [facility] [facility] her A1c was 8.6% , she was on Humulin MIx which we adjusted    Moved from [place]  ~ early 2021   GLP-1 agonists started 04/2021  Insulin mix was switched to basal/prandial in May 2023  SUBJECTIVE:   During the last visit (03/05/2022): A1c 6.5 %.   Today (09/09/2022): [name] is here for a follow up on diabetes management.  He checks his blood sugars 3 times daily. The patient has had hypoglycemic episodes since the last clinic visit.  Has sporadic diarrhea   He was recently seen by the wound clinic for right leg cellulitis , treated with Abx, has been off for a month      HOME DIABETES REGIMEN:  Humulin Mix 35 units with Breakfast and 35 units with Supper Trulicity 1.5 mg weekly    Statin: yes ACE-I/ARB: Intolerant to Lisinopril to AKI    METER DOWNLOAD SUMMARY:  Unable to download  64 -  311     DIABETIC COMPLICATIONS: Microvascular complications:  CKD, neuropathy Denies: retinopathy Last eye exam: Completed 05/16/2021   Macrovascular complications:   Denies: CAD, PVD, CVA      HISTORY:  Past Medical History:  Past Medical History:  Diagnosis Date   Arthritis    Chronic kidney disease    reduced kidney function. Stage III   Diabetes mellitus  without complication (HCC)    DVT (deep venous thrombosis) [facility])    age indeterminate LLE DVT 02/09/20   Dyspnea    On exertion   History of kidney stones    History of pulmonary embolus (PE)    Hypertension    Lipidemia    Prostate enlargement    Pulmonary embolism ([place]) 2009   Sleep apnea    Past Surgical History:  Past Surgical History:  Procedure Laterality Date   CYSTOSCOPY W/ URETERAL STENT PLACEMENT Left 10/08/2020   Procedure: CYSTOSCOPY WITH RETROGRADE PYELOGRAM/URETERAL STENT PLACEMENT;  Surgeon: Irine Seal, MD;  Location: [place];  Service: Urology;  Laterality: Left;   CYSTOSCOPY/URETEROSCOPY/HOLMIUM LASER/STENT PLACEMENT Right 06/07/2020   Procedure: CYSTOSCOPY/URETEROSCOPY/HOLMIUM LASER/STENT PLACEMENT;  Surgeon: Lucas Mallow, MD;  [facility];  Service: Urology;  Laterality: Right;   CYSTOSCOPY/URETEROSCOPY/HOLMIUM LASER/STENT PLACEMENT Left 10/19/2020   Procedure: CYSTOSCOPY LEFT URETEROSCOPY/HOLMIUM LASER/STENT EXCHANGE;  Surgeon: Irine Seal, MD;  [facility];  Service: Urology;  Laterality: Left;   TOTAL KNEE ARTHROPLASTY Left 08/06/2021   Procedure: LEFT TOTAL KNEE ARTHROPLASTY;  Surgeon: Leandrew Koyanagi, MD;  Location: [place];  Service: Orthopedics;  Laterality: Left;   Social History:  reports that he has never smoked. He has never used smokeless tobacco. He reports current alcohol use of about 1.0 standard drink of alcohol per week. He reports that he  does not use drugs. Family History:  Family History  Problem Relation Age of Onset   Cancer Mother    Heart disease Father    Colon cancer Neg Hx    Esophageal cancer Neg Hx    Rectal cancer Neg Hx    Stomach cancer Neg Hx      HOME MEDICATIONS: Allergies as of 09/10/2022   No Known Allergies      Medication List        Accurate as of September 09, 2022 12:55 PM. If you have any questions, ask your nurse or doctor.          Accu-Chek Aviva device by Other route. Use as instructed    Accu-Chek Guide test strip Generic drug: glucose blood USE TO CHECK BLOOD SUGAR 3 TIMES DAILY.E11.65   Accu-Chek Softclix Lancets lancets Use as instructed to check blood sugar 3 times daily Dx is E11.65   acetaminophen 650 MG CR tablet Commonly known as: TYLENOL Take 1,300 mg by mouth 2 (two) times daily.   Dexcom G6 Sensor Misc 1 Device by Does not apply route as directed.   Dexcom G6 Transmitter Misc 1 Device by Does not apply route as directed.   enoxaparin 150 MG/ML injection Commonly known as: LOVENOX Inject 1 mL (150 mg total) into the skin daily. AS DIRECTED BY ANTICOAGULATION CLINIC   gabapentin 300 MG capsule Commonly known as: NEURONTIN Take 1 capsule (300 mg total) by mouth 4 (four) times daily.   insulin glargine 100 UNIT/ML injection Commonly known as: Lantus Inject 0.2 mLs (20 Units total) into the skin daily. What changed:  how much to take when to take this   insulin lispro 100 UNIT/ML KwikPen Commonly known as: HumaLOG KwikPen Contact your endocrinologist and/or use a sliding scale that you have used before-to be clear this is a short acting insulin and she must of given you instructions with regards to its use   Insulin Pen Needle 32G X 4 MM Misc 1 Device by Does not apply route 3 (three) times daily.   Insulin Syringes (Disposable) U-100 1 ML Misc 1 Device by Does not apply route as directed.   Insulin Syringes 31G X 5/16" 0.5 ML Misc 1 Device by Does not apply route daily in the afternoon.   losartan 50 MG tablet Commonly known as: COZAAR TAKE 1 TABLET BY MOUTH EVERY DAY   lovastatin 40 MG tablet Commonly known as: MEVACOR Take 1 tablet (40 mg total) by mouth at bedtime.   Trulicity 1.5 TS/1.7BL Sopn Generic drug: Dulaglutide Inject into the skin once a week.   warfarin 5 MG tablet Commonly known as: COUMADIN Take 5-7.5 mg by mouth See admin instructions. Taking 5 mg on Thursday and Saturdays and  taking 7.5 mg on Monday, Tuesday,  WEdnesday, and Sunday.         OBJECTIVE:   Vital Signs: There were no vitals taken for this visit.  Wt Readings from Last 3 Encounters:  05/31/22 231 lb (104.8 kg)  04/24/22 235 lb 1.6 oz (106.6 kg)  04/03/22 231 lb 9.6 oz (105.1 kg)     Exam: General: Pt appears well and is in NAD  Lungs: Clear with good BS bilat with no rales, rhonchi, or wheezes  Heart: RRR   Extremities: No pretibial edema on the left  Neuro: MS is good with appropriate affect, pt is alert and Ox3     DM foot exam: 45/12/2021- unable to perform patient has Ace wrap and around the  right leg due to cellulitis     DATA REVIEWED:  Lab Results  Component Value Date   HGBA1C 6.5 (A) 03/05/2022   HGBA1C 6.4 (H) 12/06/2021   HGBA1C 6.0 (A) 08/29/2021   Lab Results  Component Value Date   MICROALBUR 4.9 (H) 08/28/2020   LDLCALC 55 01/01/2022   CREATININE 1.90 (H) 03/14/2022   Lab Results  Component Value Date   MICRALBCREAT 4.4 08/28/2020     Lab Results  Component Value Date   CHOL 112 01/01/2022   HDL 34.50 (L) 01/01/2022   LDLCALC 55 01/01/2022   LDLDIRECT 52.0 01/01/2022   TRIG 114.0 01/01/2022   CHOLHDL 3 01/01/2022         ASSESSMENT / PLAN / RECOMMENDATIONS:   1) Type 2 Diabetes Mellitus, With Neuropathic and CKD III complications - Most recent A1c of 6.5 %. Goal A1c < 7.0 %.    -His A1c continues to be optimal -Patient has been running out of insulin mix, and I am concerned that he is sometimes taking too much, given history of hypoglycemic episodes, I am concerned about continuing use of this insulin and I have suggested putting him on basal/prandial insulin that way he will have more flexibility -He is currently on insulin mix through vials, we have opted to keep his basal insulin in vials but we will switch his prandial insulin into pens that way he can take it with him when he is out and about -We will not change the dose of Trulicity at this time -We discussed CGM  technology, he agreed to trying to send a prescription of Dexcom to [facility] which was faxed  MEDICATIONS: -Stop Humalog Mix -Start Lantus 35 units daily -Start Humalog 10 units 3 times daily before every meal -Continue Trulicity 1.5 mg weekly  -Correction factor: Humalog (BG -130/25)      EDUCATION / INSTRUCTIONS: BG monitoring instructions: Patient is instructed to check his blood sugars 3 times a day, before breakfast and supper. Call [facility] if: BG persistently < 70 I reviewed the Rule of 15 for the treatment of hypoglycemia in detail with the patient. Literature supplied.   2) Diabetic complications:  Eye: Does not have known diabetic retinopathy.  Neuro/ Feet: Does  have known diabetic peripheral neuropathy .  Renal: Patient does  have known baseline CKD. He  Is on ARB.      F/U in 6 months     Signed electronically by: Mack Guise, MD  [facility]  [facility] [address] [place], [place] [address] Phone: [phone] FAX: [phone]   CC: [name], [place] [address] Phone: [phone]  Fax: [phone]  Return to Endocrinology clinic as below: Future Appointments  Date Time Provider [place]  09/10/2022 10:30 AM Matelyn Antonelli, Melanie Crazier, MD [facility] None  04/15/2023 11:00 AM Peters ADVISOR [facility]

## 2022-09-10 ENCOUNTER — Encounter: Payer: Self-pay | Admitting: *Deleted

## 2022-09-10 ENCOUNTER — Ambulatory Visit: Payer: Medicare Other | Admitting: Internal Medicine

## 2022-09-12 DIAGNOSIS — G4733 Obstructive sleep apnea (adult) (pediatric): Secondary | ICD-10-CM | POA: Diagnosis not present

## 2022-09-26 ENCOUNTER — Other Ambulatory Visit: Payer: Self-pay | Admitting: Internal Medicine

## 2022-09-26 IMAGING — CT CT EXTREM LOW W/O CM*R*
2 of 3 series · 11 of 46 positions shown, 12 images · non-contrast
Comparison: None.

CLINICAL DATA: Right lower extremity cellulitis, leukocytosis, on
antibiotics

EXAM:
CT OF THE LOWER RIGHT EXTREMITY WITHOUT CONTRAST
TECHNIQUE: Multidetector CT imaging of the right lower extremity was performed
according to the standard protocol.
RADIATION DOSE REDUCTION: This exam was performed according to the
departmental dose-optimization program which includes automated
exposure control, adjustment of the mA and/or kV according to
patient size and/or use of iterative reconstruction technique.

[Series 4: axial st · axial · 0.49mm/px · z∈[-1244,-364]mm · 8 of 506 slices shown, 9 images]
[im 33/506  soft-tissue]
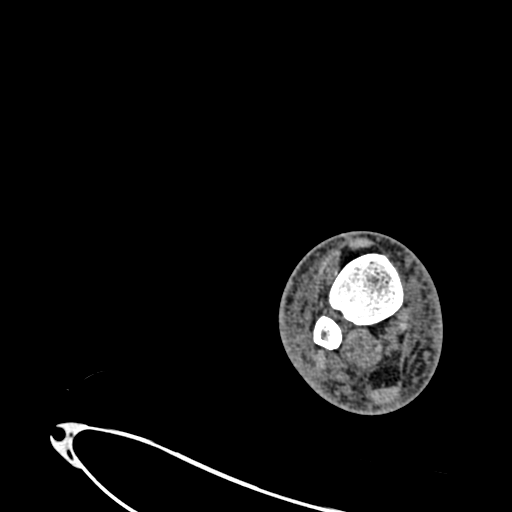
[im 33/506  bone]
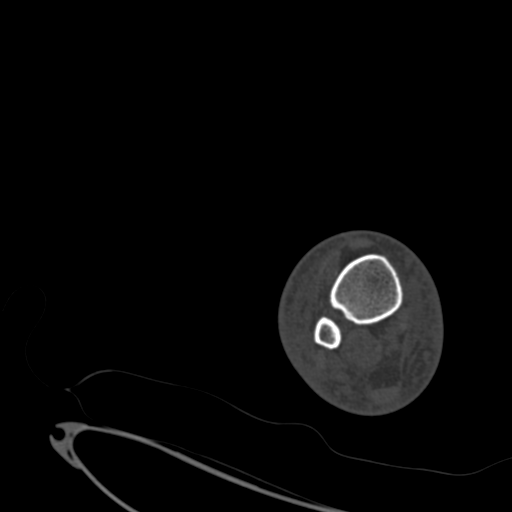
[im 98/506  soft-tissue]
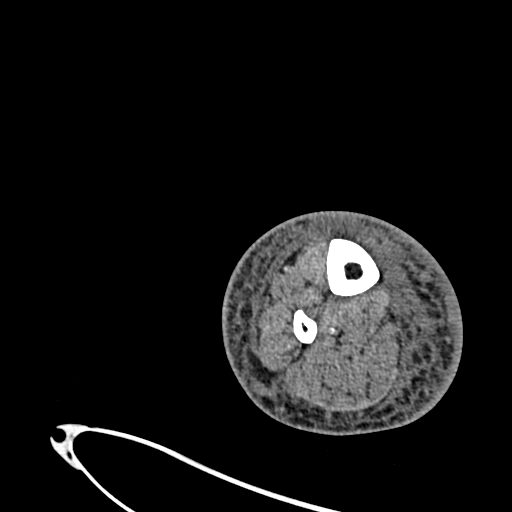
[im 163/506  soft-tissue]
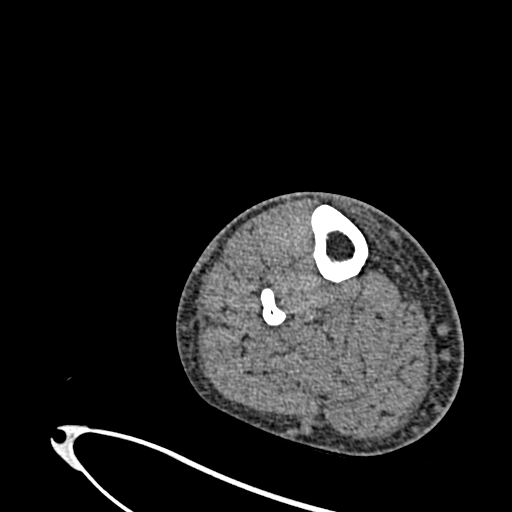
[im 229/506  soft-tissue]
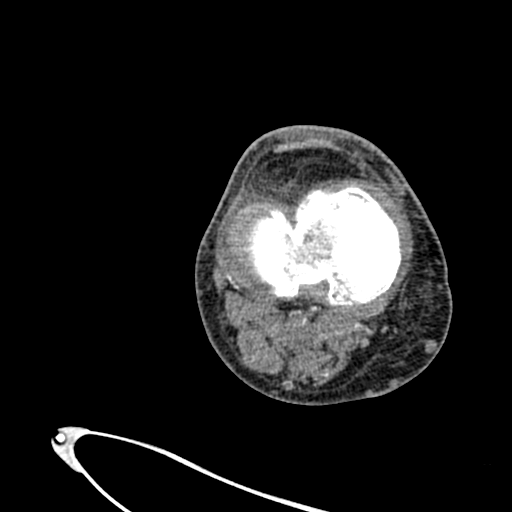
[im 277/506  soft-tissue]
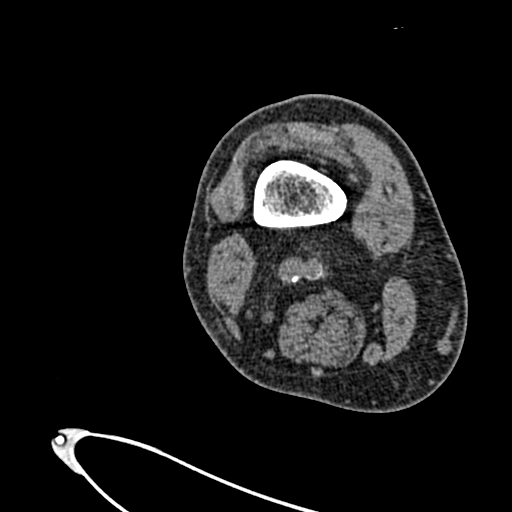
[im 343/506  soft-tissue]
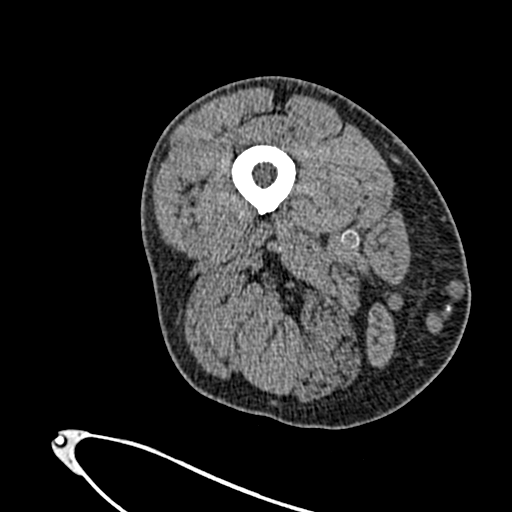
[im 408/506  soft-tissue]
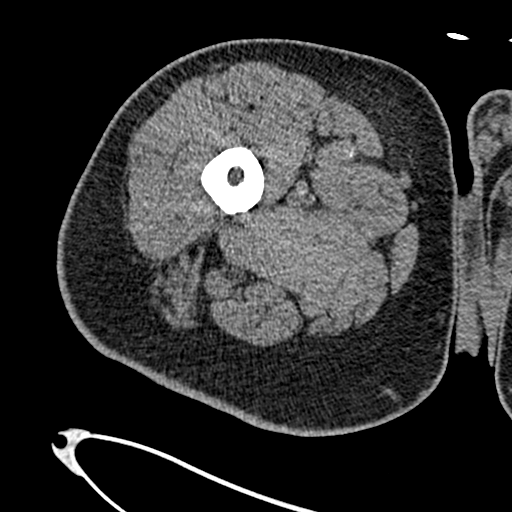
[im 473/506  soft-tissue]
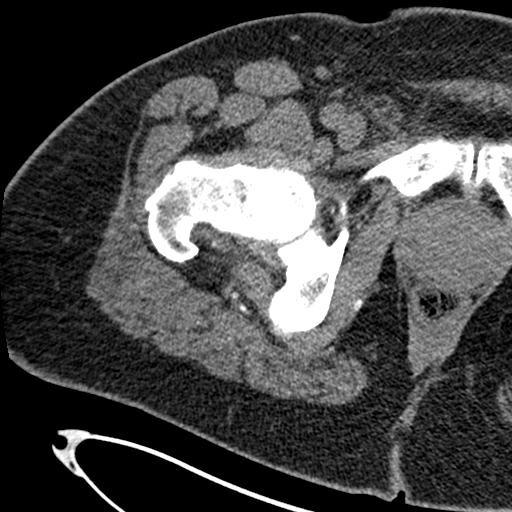

[Series 7: coronal st · coronal · 0.53mm/px · 3 of 149 slices shown]
[im 50/149  soft-tissue]
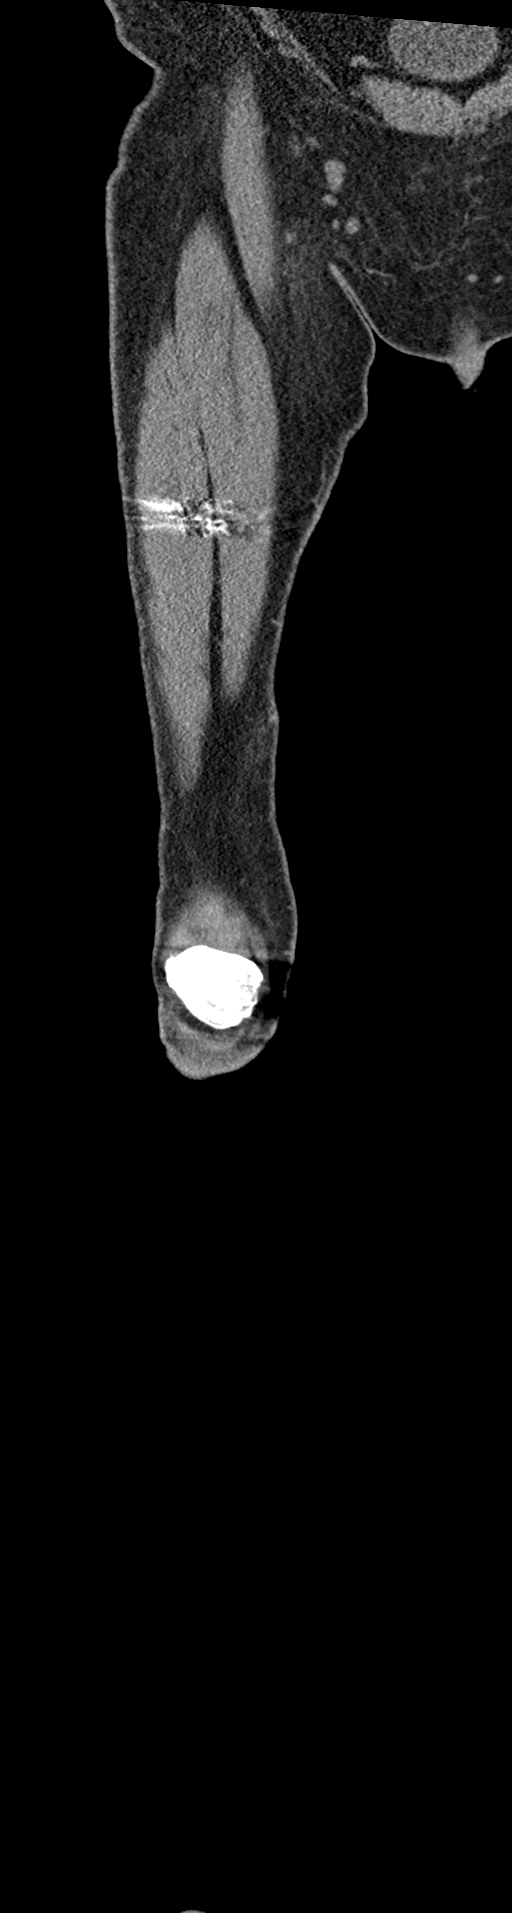
[im 66/149  soft-tissue]
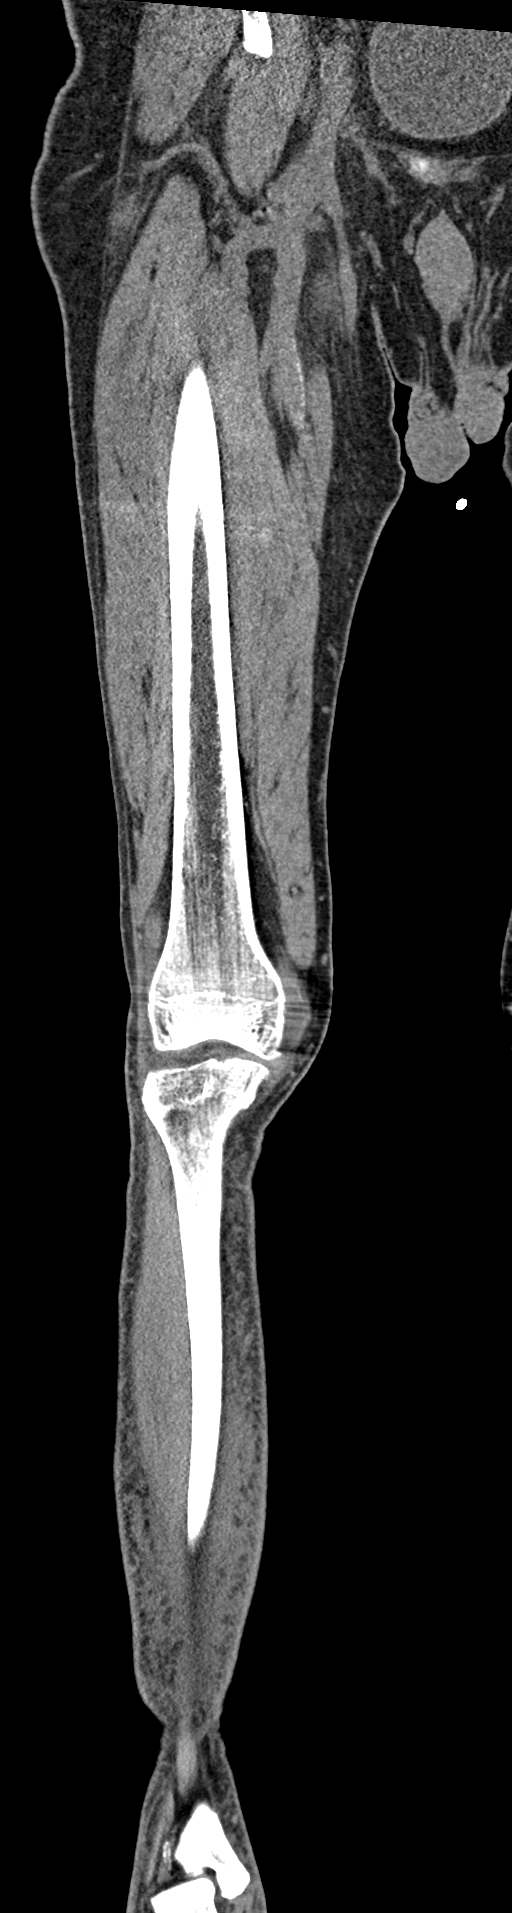
[im 83/149  soft-tissue]
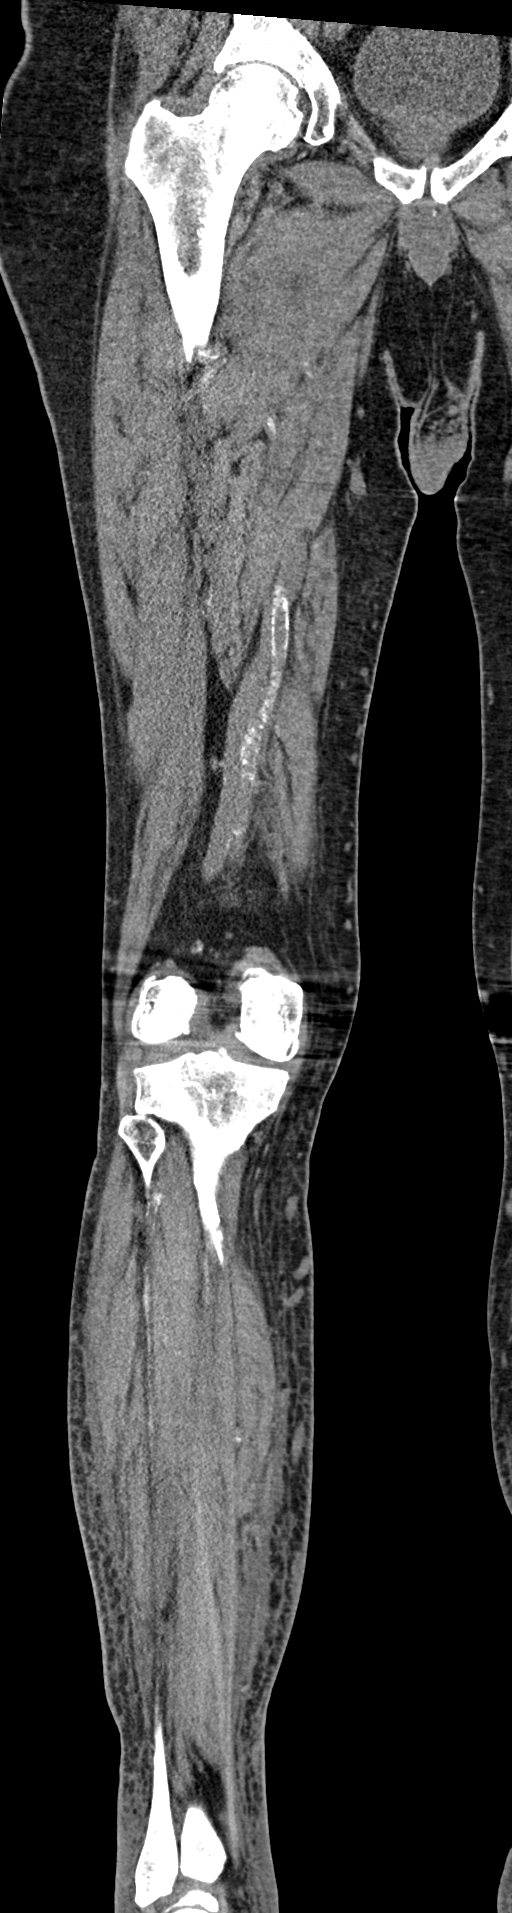

[11 of 46 positions shown; findings below may reference images not displayed]

FINDINGS: No fracture is seen. No cortical destruction to suggest acute
osteomyelitis.

No drainable fluid collection/abscess.

Subcutaneous edema with trace fluid along the medial aspect of the
calf (series 4/image 414), with overlying mild skin thickening
(series 4/image 412), corresponding to the clinical history of
cellulitis.

Additional subcutaneous edema/cutaneous thickening along the medial
and lateral ankle (series 4/image 493).
IMPRESSION: Suspected cellulitis along the medial calf.

No drainable fluid collection/abscess.

No acute osseous abnormality

## 2022-10-12 DIAGNOSIS — G4733 Obstructive sleep apnea (adult) (pediatric): Secondary | ICD-10-CM | POA: Diagnosis not present

## 2022-11-20 DIAGNOSIS — G4733 Obstructive sleep apnea (adult) (pediatric): Secondary | ICD-10-CM | POA: Diagnosis not present

## 2022-12-01 ENCOUNTER — Other Ambulatory Visit: Payer: Self-pay | Admitting: Family Medicine

## 2022-12-05 ENCOUNTER — Encounter: Payer: Self-pay | Admitting: Internal Medicine

## 2022-12-05 DIAGNOSIS — Z794 Long term (current) use of insulin: Secondary | ICD-10-CM

## 2022-12-05 MED ORDER — "INSULIN SYRINGE-NEEDLE U-100 31G X 5/16"" 0.5 ML MISC": 3 refills | Status: AC

## 2022-12-10 DIAGNOSIS — Z86711 Personal history of pulmonary embolism: Secondary | ICD-10-CM | POA: Diagnosis not present

## 2022-12-10 DIAGNOSIS — E785 Hyperlipidemia, unspecified: Secondary | ICD-10-CM | POA: Diagnosis not present

## 2022-12-10 DIAGNOSIS — M1990 Unspecified osteoarthritis, unspecified site: Secondary | ICD-10-CM | POA: Diagnosis not present

## 2022-12-10 DIAGNOSIS — Z7901 Long term (current) use of anticoagulants: Secondary | ICD-10-CM | POA: Diagnosis not present

## 2022-12-10 DIAGNOSIS — I10 Essential (primary) hypertension: Secondary | ICD-10-CM | POA: Diagnosis not present

## 2022-12-10 DIAGNOSIS — E1169 Type 2 diabetes mellitus with other specified complication: Secondary | ICD-10-CM | POA: Diagnosis not present

## 2022-12-26 DIAGNOSIS — E1169 Type 2 diabetes mellitus with other specified complication: Secondary | ICD-10-CM | POA: Diagnosis not present

## 2022-12-26 DIAGNOSIS — M1990 Unspecified osteoarthritis, unspecified site: Secondary | ICD-10-CM | POA: Diagnosis not present

## 2022-12-26 DIAGNOSIS — E785 Hyperlipidemia, unspecified: Secondary | ICD-10-CM | POA: Diagnosis not present

## 2022-12-26 DIAGNOSIS — Z86711 Personal history of pulmonary embolism: Secondary | ICD-10-CM | POA: Diagnosis not present

## 2022-12-26 DIAGNOSIS — N1832 Chronic kidney disease, stage 3b: Secondary | ICD-10-CM | POA: Diagnosis not present

## 2022-12-26 DIAGNOSIS — I10 Essential (primary) hypertension: Secondary | ICD-10-CM | POA: Diagnosis not present

## 2022-12-27 ENCOUNTER — Telehealth: Payer: Self-pay

## 2022-12-27 NOTE — Patient Instructions (Signed)
Visit Information  Thank you for taking time to visit with me today. Please don't hesitate to contact me if I can be of assistance to you.   Following are the goals we discussed today:   Goals Addressed             This Visit's Progress    COMPLETED: Care Coordination Activities- No follow up required       Interventions Today    Flowsheet Row Most Recent Value  Chronic Disease   Chronic disease during today's visit Chronic Kidney Disease/End Stage Renal Disease (ESRD)  General Interventions   General Interventions Discussed/Reviewed General Interventions Discussed  Education Interventions   Education Provided Provided Education  Provided Verbal Education On Medication  Pharmacy Interventions   Pharmacy Dicussed/Reviewed Medications and their functions      Patient currently living outside of service area and working to get PCP in his new area. He has seen a kidney doctor in that area.  He has been taken off his statin due to chronic kidney disease.          If you are experiencing a Mental Health or [place] or need someone to talk to, please call the Suicide and Crisis Lifeline: 988   Patient verbalizes understanding of instructions and care plan provided today and agrees to view in [place]. Active MyChart status and patient understanding of how to access instructions and care plan via MyChart confirmed with patient.     No further follow up required: decline  Jone Baseman, RN, MSN [place] Care Management Coordinator Direct Line [phone]

## 2022-12-27 NOTE — Patient Outreach (Signed)
  Care Coordination   Initial Visit Note   12/27/2022 Name: [name] MRN: [id number] DOB: 1955-12-12  [name] is a 67 y.o. year old male who sees [name] Maw, MD for primary care. I spoke with  [name] by phone today.  What matters to the patients health and wellness today?  none    Goals Addressed             This Visit's Progress    COMPLETED: Care Coordination Activities- No follow up required       Interventions Today    Flowsheet Row Most Recent Value  Chronic Disease   Chronic disease during today's visit Chronic Kidney Disease/End Stage Renal Disease (ESRD)  General Interventions   General Interventions Discussed/Reviewed General Interventions Discussed  Education Interventions   Education Provided Provided Education  Provided Verbal Education On Medication  Pharmacy Interventions   Pharmacy Dicussed/Reviewed Medications and their functions      Patient currently living outside of service area and working to get PCP in his new area. He has seen a kidney doctor in that area.  He has been taken off his statin due to chronic kidney disease.         SDOH assessments and interventions completed:  No     Care Coordination Interventions:  Yes, provided   Follow up plan: No further intervention required.   Encounter Outcome:  Pt. Visit Completed   [name] Baseman, RN, MSN [place] Care Management Coordinator Direct Line [phone]

## 2022-12-31 DIAGNOSIS — Z86711 Personal history of pulmonary embolism: Secondary | ICD-10-CM | POA: Diagnosis not present

## 2022-12-31 DIAGNOSIS — Z7901 Long term (current) use of anticoagulants: Secondary | ICD-10-CM | POA: Diagnosis not present

## 2023-01-24 DIAGNOSIS — Z86711 Personal history of pulmonary embolism: Secondary | ICD-10-CM | POA: Diagnosis not present

## 2023-01-24 DIAGNOSIS — E1169 Type 2 diabetes mellitus with other specified complication: Secondary | ICD-10-CM | POA: Diagnosis not present

## 2023-01-24 DIAGNOSIS — E785 Hyperlipidemia, unspecified: Secondary | ICD-10-CM | POA: Diagnosis not present

## 2023-01-24 DIAGNOSIS — I10 Essential (primary) hypertension: Secondary | ICD-10-CM | POA: Diagnosis not present

## 2023-02-19 DIAGNOSIS — G4733 Obstructive sleep apnea (adult) (pediatric): Secondary | ICD-10-CM | POA: Diagnosis not present

## 2023-08-18 ENCOUNTER — Telehealth: Payer: Self-pay | Admitting: Family Medicine

## 2023-08-18 NOTE — Telephone Encounter (Signed)
Spoke with patient to schedule AWV.  He stated he has moved out of the area

## 2023-10-05 ENCOUNTER — Other Ambulatory Visit: Payer: Self-pay | Admitting: Internal Medicine

## 2023-10-05 DIAGNOSIS — Z794 Long term (current) use of insulin: Secondary | ICD-10-CM

## 2023-11-04 ENCOUNTER — Other Ambulatory Visit: Payer: Self-pay | Admitting: Internal Medicine

## 2023-11-04 DIAGNOSIS — Z794 Long term (current) use of insulin: Secondary | ICD-10-CM
